# Patient Record
Sex: Female | Born: 1950 | Race: White | Hispanic: No | Smoking: Current every day smoker
Health system: Southern US, Community
[De-identification: ages and names within clinical notes are randomized; demographics above are authoritative.]

## PROBLEM LIST (undated history)

## (undated) DIAGNOSIS — K589 Irritable bowel syndrome without diarrhea: Secondary | ICD-10-CM

## (undated) DIAGNOSIS — M1990 Unspecified osteoarthritis, unspecified site: Secondary | ICD-10-CM

## (undated) DIAGNOSIS — F32A Depression, unspecified: Secondary | ICD-10-CM

## (undated) DIAGNOSIS — C7931 Secondary malignant neoplasm of brain: Secondary | ICD-10-CM

## (undated) DIAGNOSIS — C7951 Secondary malignant neoplasm of bone: Secondary | ICD-10-CM

## (undated) DIAGNOSIS — J449 Chronic obstructive pulmonary disease, unspecified: Secondary | ICD-10-CM

## (undated) DIAGNOSIS — C50919 Malignant neoplasm of unspecified site of unspecified female breast: Secondary | ICD-10-CM

## (undated) DIAGNOSIS — C801 Malignant (primary) neoplasm, unspecified: Secondary | ICD-10-CM

## (undated) DIAGNOSIS — K219 Gastro-esophageal reflux disease without esophagitis: Secondary | ICD-10-CM

## (undated) DIAGNOSIS — F329 Major depressive disorder, single episode, unspecified: Secondary | ICD-10-CM

## (undated) HISTORY — DX: Secondary malignant neoplasm of brain: C79.31

## (undated) HISTORY — PX: REPLACEMENT TOTAL KNEE: SUR1224

## (undated) HISTORY — DX: Malignant neoplasm of unspecified site of unspecified female breast: C50.919

## (undated) HISTORY — DX: Irritable bowel syndrome, unspecified: K58.9

## (undated) HISTORY — DX: Secondary malignant neoplasm of bone: C79.51

## (undated) HISTORY — PX: JOINT REPLACEMENT: SHX530

## (undated) HISTORY — DX: Malignant (primary) neoplasm, unspecified: C80.1

---

## 1976-01-03 HISTORY — PX: ABDOMINAL HYSTERECTOMY: SHX81

## 2013-01-02 DIAGNOSIS — C801 Malignant (primary) neoplasm, unspecified: Secondary | ICD-10-CM

## 2013-01-02 HISTORY — PX: PORTACATH PLACEMENT: SHX2246

## 2013-01-02 HISTORY — DX: Malignant (primary) neoplasm, unspecified: C80.1

## 2013-03-16 ENCOUNTER — Emergency Department: Payer: Self-pay | Admitting: Emergency Medicine

## 2013-03-16 LAB — BASIC METABOLIC PANEL
ANION GAP: 8 (ref 7–16)
BUN: 18 mg/dL (ref 7–18)
CHLORIDE: 103 mmol/L (ref 98–107)
CO2: 26 mmol/L (ref 21–32)
Calcium, Total: 10.1 mg/dL (ref 8.5–10.1)
Creatinine: 0.79 mg/dL (ref 0.60–1.30)
EGFR (African American): >60
GLUCOSE: 93 mg/dL (ref 65–99)
OSMOLALITY: 275 (ref 275–301)
POTASSIUM: 3.9 mmol/L (ref 3.5–5.1)
Sodium: 137 mmol/L (ref 136–145)

## 2013-03-16 LAB — CBC
HCT: 44.6 % (ref 35.0–47.0)
HGB: 14.2 g/dL (ref 12.0–16.0)
MCH: 28.7 pg (ref 26.0–34.0)
MCHC: 32 g/dL (ref 32.0–36.0)
MCV: 90 fL (ref 80–100)
Platelet: 330 10*3/uL (ref 150–440)
RBC: 4.96 10*6/uL (ref 3.80–5.20)
RDW: 15 % — AB (ref 11.5–14.5)
WBC: 10.6 10*3/uL (ref 3.6–11.0)

## 2013-03-16 LAB — TROPONIN I

## 2013-03-19 ENCOUNTER — Ambulatory Visit: Payer: Self-pay | Admitting: Internal Medicine

## 2013-03-19 LAB — HEPATIC FUNCTION PANEL A (ARMC)
ALT: 23 U/L (ref 12–78)
Albumin: 3.4 g/dL (ref 3.4–5.0)
Alkaline Phosphatase: 106 U/L
Bilirubin, Direct: 0.1 mg/dL (ref 0.00–0.20)
Bilirubin,Total: 0.3 mg/dL (ref 0.2–1.0)
SGOT(AST): 49 U/L — ABNORMAL HIGH (ref 15–37)
Total Protein: 7.5 g/dL (ref 6.4–8.2)

## 2013-03-20 LAB — CEA: CEA: 5.5 ng/mL — ABNORMAL HIGH (ref 0.0–4.7)

## 2013-03-20 LAB — CANCER ANTIGEN 27.29: CA 27.29: 916.3 U/mL — ABNORMAL HIGH (ref 0.0–38.6)

## 2013-03-21 ENCOUNTER — Other Ambulatory Visit: Payer: Self-pay | Admitting: General Surgery

## 2013-03-21 ENCOUNTER — Encounter: Payer: Self-pay | Admitting: General Surgery

## 2013-03-21 ENCOUNTER — Ambulatory Visit (INDEPENDENT_AMBULATORY_CARE_PROVIDER_SITE_OTHER): Payer: No Typology Code available for payment source | Admitting: General Surgery

## 2013-03-21 VITALS — BP 126/70 | HR 68 | Resp 14 | Ht 64.0 in | Wt 143.0 lb

## 2013-03-21 DIAGNOSIS — N63 Unspecified lump in unspecified breast: Secondary | ICD-10-CM

## 2013-03-21 NOTE — Progress Notes (Signed)
Patient ID: Elizabeth Bond, female   DOB: 1950-05-11, 63 y.o.   MRN: [id number]  Chief Complaint  Patient presents with  . Other    right breast mass/ulceration    HPI Elizabeth Bond is a 63 y.o. female who presents for an evaluation of right breast mass/ulceration. The patient states she has never had a mammogram done. She has a family history of breast cancer. The patient states she went to the ER due to back pain and while there they found the breast mass/ulceration. The patient has lost approximately 14 pounds. Appetite is poor. She states the breast has been like this for approximately 2-3 months.   HPI  Past Medical History  Diagnosis Date  . IBS (irritable bowel syndrome)     Past Surgical History  Procedure Laterality Date  . Replacement total knee Left 2004-2005?  Marland Kitchen Abdominal hysterectomy  1978    Family History  Problem Relation Age of Onset  . Cancer Father     prostate  . Cancer Maternal Aunt     breast  . Cancer Cousin     breast  . Cancer Other     lung cancer - neice    Social History History  Substance Use Topics  . Smoking status: Current Every Day Smoker -- 0.25 packs/day for 40 years    Types: Cigarettes  . Smokeless tobacco: Never Used  . Alcohol Use: Yes    No Known Allergies  Current Outpatient Prescriptions  Medication Sig Dispense Refill  . fentaNYL (DURAGESIC - DOSED MCG/HR) 12 MCG/HR Place 12.5 mcg onto the skin every 3 (three) days.      Marland Kitchen ibuprofen (ADVIL,MOTRIN) 200 MG tablet Take 400 mg by mouth every 6 (six) hours as needed.      Marland Kitchen oxyCODONE-acetaminophen (PERCOCET/ROXICET) 5-325 MG per tablet Take 2 tablets by mouth every 6 (six) hours.       No current facility-administered medications for this visit.    Review of Systems Review of Systems  Constitutional: Negative.   Respiratory: Negative.   Cardiovascular: Negative.     Blood pressure 126/70, pulse 68, resp. rate 14, height 5\' 4"  (1.626 m), weight 143 lb (64.864 kg).  Physical  Exam Physical Exam  Constitutional: She appears well-developed and well-nourished.  Neck: Neck supple. No thyromegaly present.  Pulmonary/Chest:  Right breast is contracted hard with central ulceration and fixed to the chest wall.   Lymphadenopathy:    She has no cervical adenopathy.  Hard node palpated in the right axilla.     Data Reviewed none  Assessment    CA right breast on clinical exam. Needs core biopsy to confirm.     Plan    With consent core biopsy completed today.     CORE BREAST BIOPSY REPORT  Name:  Elizabeth Bond DOB:  February 17, 1950  Vital signs:BP 126/70  Pulse 68  Resp 14  Ht 5\' 4"  (1.626 m)  Wt 143 lb (64.864 kg)  BMI 24.53 kg/m2  Lesion: right breast mass  Location:   Right  Whole breast Local anesthetic:   58ml of 1% Xylocaine/0.5% Marcaine  Prep:  Chloroprep Device:  14Gauge   Bard   Ultrasound guidance: not used Patient tolerance:  Patient tolerated the procedure well   Approach: unspecified  Clip:not used Dressing:Steristrips, telfa and tegaderm. Ice pack applied. Written instructions provided to patient regarding wound care.   Patient advised that patient will be contacted by phone when pathology report is available.  Followup appointment to be scheduled after  pathology.   CC: No PCP Per Patient, No ref. provider found  Jayln Madeira G     Sanjeev Main G 03/25/2013, 1:25 PM

## 2013-03-21 NOTE — Patient Instructions (Signed)
CARE AFTER BREAST BIOPSY  1. Leave the dressing on that your doctor applied after surgery. It is waterproof. You may bathe, shower and/or swim. The dressing will probably remain intact until your return office visit. If the dressing comes off, you will see small strips of tape against your skin on the incision. Do not remove these strips.  2. You may want to use a gauze,cloth or similar protection in your bra to prevent rubbing against your dressing and incision. This is not necessary, but you may feel more comfortable doing so.  3. It is recommended that you wear a bra day and night to give support to the breast. This will prevent the weight of the breast from pulling on the incision.  4. Your breast will feel hard and lumpy under the incision. Do not be alarmed. This is the underlying stitching of tissue. Softening of this tissue will occur in time.  5. Make sure you call the office and schedule an appointment in one week after your surgery. The office phone number is [phone]. The [facility] may have already done this for you.  6. You will notice about a week after your office visit that the strips of the tape on your incision will begin to loosen. These may then be removed.  7. Report to your doctor any of the following:  * Severe pain not relieved by your pain medication  *Redness of the incision  * Drainage from the incision  *Fever greater than 101 degrees   The patient is aware to call back for any questions or concerns.

## 2013-03-24 ENCOUNTER — Ambulatory Visit: Payer: Self-pay | Admitting: Internal Medicine

## 2013-03-25 ENCOUNTER — Encounter: Payer: Self-pay | Admitting: General Surgery

## 2013-03-27 LAB — PATHOLOGY

## 2013-04-02 ENCOUNTER — Ambulatory Visit: Payer: Self-pay | Admitting: Internal Medicine

## 2013-04-03 ENCOUNTER — Telehealth: Payer: Self-pay | Admitting: *Deleted

## 2013-04-03 NOTE — Telephone Encounter (Signed)
Daughter called asking about dressing care. Do they need to continue with the cream. She is changing the dressing twice a day. She wasn't sure if there was anything else that needed to be done and asked about her results. She has an appointment with Dr Cynda Acres tomorrow.

## 2013-04-10 LAB — CBC CANCER CENTER
BASOS PCT: 0.4 %
Basophil #: 0 x10 3/mm (ref 0.0–0.1)
EOS PCT: 0.5 %
Eosinophil #: 0.1 x10 3/mm (ref 0.0–0.7)
HCT: 45.8 % (ref 35.0–47.0)
HGB: 14.8 g/dL (ref 12.0–16.0)
LYMPHS ABS: 1.5 x10 3/mm (ref 1.0–3.6)
Lymphocyte %: 15.1 %
MCH: 28.8 pg (ref 26.0–34.0)
MCHC: 32.2 g/dL (ref 32.0–36.0)
MCV: 89 fL (ref 80–100)
MONOS PCT: 5.9 %
Monocyte #: 0.6 x10 3/mm (ref 0.2–0.9)
NEUTROS ABS: 7.7 x10 3/mm — AB (ref 1.4–6.5)
Neutrophil %: 78.1 %
PLATELETS: 409 x10 3/mm (ref 150–440)
RBC: 5.13 10*6/uL (ref 3.80–5.20)
RDW: 15 % — ABNORMAL HIGH (ref 11.5–14.5)
WBC: 9.9 x10 3/mm (ref 3.6–11.0)

## 2013-04-30 LAB — CBC CANCER CENTER
BASOS PCT: 0.7 %
Basophil #: 0.1 x10 3/mm (ref 0.0–0.1)
Eosinophil #: 0 x10 3/mm (ref 0.0–0.7)
Eosinophil %: 0.4 %
HCT: 47.7 % — ABNORMAL HIGH (ref 35.0–47.0)
HGB: 16 g/dL (ref 12.0–16.0)
LYMPHS ABS: 0.6 x10 3/mm — AB (ref 1.0–3.6)
Lymphocyte %: 8.5 %
MCH: 29.4 pg (ref 26.0–34.0)
MCHC: 33.6 g/dL (ref 32.0–36.0)
MCV: 87 fL (ref 80–100)
Monocyte #: 0.4 x10 3/mm (ref 0.2–0.9)
Monocyte %: 5.7 %
NEUTROS PCT: 84.7 %
Neutrophil #: 6.3 x10 3/mm (ref 1.4–6.5)
PLATELETS: 314 x10 3/mm (ref 150–440)
RBC: 5.46 10*6/uL — AB (ref 3.80–5.20)
RDW: 15.5 % — ABNORMAL HIGH (ref 11.5–14.5)
WBC: 7.4 x10 3/mm (ref 3.6–11.0)

## 2013-04-30 LAB — COMPREHENSIVE METABOLIC PANEL
ALT: 41 U/L (ref 12–78)
AST: 27 U/L (ref 15–37)
Albumin: 3.3 g/dL — ABNORMAL LOW (ref 3.4–5.0)
Alkaline Phosphatase: 124 U/L — ABNORMAL HIGH
Anion Gap: 9 (ref 7–16)
BILIRUBIN TOTAL: 0.5 mg/dL (ref 0.2–1.0)
BUN: 13 mg/dL (ref 7–18)
CO2: 29 mmol/L (ref 21–32)
Calcium, Total: 8.2 mg/dL — ABNORMAL LOW (ref 8.5–10.1)
Chloride: 102 mmol/L (ref 98–107)
Creatinine: 0.83 mg/dL (ref 0.60–1.30)
EGFR (Non-African Amer.): >60
GLUCOSE: 87 mg/dL (ref 65–99)
OSMOLALITY: 279 (ref 275–301)
POTASSIUM: 3.7 mmol/L (ref 3.5–5.1)
Sodium: 140 mmol/L (ref 136–145)
Total Protein: 7.4 g/dL (ref 6.4–8.2)

## 2013-04-30 LAB — PHOSPHORUS: Phosphorus: 1.8 mg/dL — ABNORMAL LOW (ref 2.5–4.9)

## 2013-05-01 LAB — CANCER ANTIGEN 19-9: CA 19 9: 19 U/mL (ref 0–35)

## 2013-05-02 ENCOUNTER — Ambulatory Visit: Payer: Self-pay | Admitting: Internal Medicine

## 2013-05-03 ENCOUNTER — Other Ambulatory Visit (HOSPITAL_COMMUNITY): Payer: Self-pay | Admitting: Internal Medicine

## 2013-05-09 ENCOUNTER — Other Ambulatory Visit (HOSPITAL_COMMUNITY): Payer: Self-pay | Admitting: Internal Medicine

## 2013-05-15 LAB — POTASSIUM: Potassium: 3.9 mmol/L (ref 3.5–5.1)

## 2013-05-15 LAB — PHOSPHORUS: Phosphorus: 2.3 mg/dL — ABNORMAL LOW (ref 2.5–4.9)

## 2013-05-16 LAB — CANCER ANTIGEN 27.29: CA 27.29: 913 U/mL — ABNORMAL HIGH (ref 0.0–38.6)

## 2013-06-02 ENCOUNTER — Ambulatory Visit: Payer: Self-pay | Admitting: Internal Medicine

## 2013-06-02 LAB — CBC CANCER CENTER
Basophil #: 0.1 x10 3/mm (ref 0.0–0.1)
Basophil %: 0.9 %
Eosinophil #: 0 x10 3/mm (ref 0.0–0.7)
Eosinophil %: 0.2 %
HCT: 36.2 % (ref 35.0–47.0)
HGB: 12.2 g/dL (ref 12.0–16.0)
Lymphocyte #: 1 x10 3/mm (ref 1.0–3.6)
Lymphocyte %: 11.3 %
MCH: 30.5 pg (ref 26.0–34.0)
MCHC: 33.6 g/dL (ref 32.0–36.0)
MCV: 91 fL (ref 80–100)
Monocyte #: 0.7 x10 3/mm (ref 0.2–0.9)
Monocyte %: 8.2 %
Neutrophil #: 7 x10 3/mm — ABNORMAL HIGH (ref 1.4–6.5)
Neutrophil %: 79.4 %
PLATELETS: 314 x10 3/mm (ref 150–440)
RBC: 4 10*6/uL (ref 3.80–5.20)
RDW: 18.2 % — AB (ref 11.5–14.5)
WBC: 8.8 x10 3/mm (ref 3.6–11.0)

## 2013-06-02 LAB — COMPREHENSIVE METABOLIC PANEL
AST: 21 U/L (ref 15–37)
Albumin: 3 g/dL — ABNORMAL LOW (ref 3.4–5.0)
Alkaline Phosphatase: 105 U/L
Anion Gap: 10 (ref 7–16)
BUN: 15 mg/dL (ref 7–18)
Bilirubin,Total: 0.3 mg/dL (ref 0.2–1.0)
CALCIUM: 8.6 mg/dL (ref 8.5–10.1)
Chloride: 105 mmol/L (ref 98–107)
Co2: 27 mmol/L (ref 21–32)
Creatinine: 0.7 mg/dL (ref 0.60–1.30)
EGFR (African American): >60
EGFR (Non-African Amer.): >60
Glucose: 94 mg/dL (ref 65–99)
Osmolality: 284 (ref 275–301)
Potassium: 3.9 mmol/L (ref 3.5–5.1)
SGPT (ALT): 34 U/L (ref 12–78)
Sodium: 142 mmol/L (ref 136–145)
Total Protein: 6.8 g/dL (ref 6.4–8.2)

## 2013-06-16 ENCOUNTER — Encounter: Payer: Self-pay | Admitting: General Surgery

## 2013-06-16 ENCOUNTER — Ambulatory Visit (INDEPENDENT_AMBULATORY_CARE_PROVIDER_SITE_OTHER): Payer: No Typology Code available for payment source | Admitting: General Surgery

## 2013-06-16 VITALS — BP 144/69 | HR 66 | Resp 14 | Ht 64.0 in | Wt 135.0 lb

## 2013-06-16 DIAGNOSIS — C50919 Malignant neoplasm of unspecified site of unspecified female breast: Secondary | ICD-10-CM | POA: Insufficient documentation

## 2013-06-16 NOTE — Patient Instructions (Addendum)
Patient to be scheduled for port placement. The patient is aware to call back for any questions or concerns.  Patient is scheduled for surgery at [facility] on 06/20/13. She will pre admit by phone. Patient is aware of date and instructions.

## 2013-06-16 NOTE — Progress Notes (Signed)
Patient ID: Elizabeth Bond, female   DOB: 1950/10/18, 63 y.o.   MRN: [id number]  Chief Complaint  Patient presents with  . Other    Evaluation of port placement     HPI [name] is a 63 y.o. female who presents for an evaluation of a port placement. The only complaint at this time is ankle/leg swelling. The patient has completed radiation therapy. Pt has metastatic right breast cancer. She declined chemo but opted for weekly hercepti and letrazole. Due to poor venous access port was requested.  HPI  Past Medical History  Diagnosis Date  . IBS (irritable bowel syndrome)   . Cancer 2015    breast and bone    Past Surgical History  Procedure Laterality Date  . Replacement total knee Left 2004-2005?  Marland Kitchen Abdominal hysterectomy  1978    Family History  Problem Relation Age of Onset  . Cancer Father     prostate  . Cancer Maternal Aunt     breast  . Cancer Cousin     breast  . Cancer Other     lung cancer - neice    Social History History  Substance Use Topics  . Smoking status: Former Smoker -- 0.25 packs/day for 40 years    Types: Cigarettes    Quit date: 04/02/2013  . Smokeless tobacco: Never Used  . Alcohol Use: Yes    No Known Allergies  Current Outpatient Prescriptions  Medication Sig Dispense Refill  . ALPRAZolam (XANAX) 0.5 MG tablet Take 1 tablet by mouth 3 (three) times daily.      . furosemide (LASIX) 20 MG tablet 1 tab by mouth 3 times a week.      . letrozole (FEMARA) 2.5 MG tablet Take 2.5 mg by mouth daily.      Marland Kitchen oxycodone (OXY-IR) 5 MG capsule Take 5 mg by mouth every 4 (four) hours as needed.      . OXYCONTIN 10 MG T12A 12 hr tablet Take 2 tablets by mouth 4 (four) times daily as needed.      . pantoprazole (PROTONIX) 40 MG tablet Take 40 mg by mouth daily.      Marland Kitchen PHOSPHA 250 NEUTRAL [id number] MG tablet Take 1 tablet by mouth 2 (two) times daily.       No current facility-administered medications for this visit.    Review of Systems Review of  Systems  Constitutional: Negative.   Respiratory: Negative.   Cardiovascular: Positive for leg swelling.    Blood pressure 144/69, pulse 66, resp. rate 14, height 5\' 4"  (1.626 m), weight 135 lb (61.236 kg).  Physical Exam Physical Exam  Constitutional: She is oriented to person, place, and time. She appears well-developed and well-nourished.  Eyes: Conjunctivae are normal. No scleral icterus.  Neck: Neck supple. No thyromegaly present.  Cardiovascular: Normal rate, regular rhythm and normal heart sounds.   No murmur heard. Pulmonary/Chest: Effort normal and breath sounds normal.  Lymphadenopathy:    She has no cervical adenopathy.  Neurological: She is alert and oriented to person, place, and time.  Skin: Skin is warm and dry.    Data Reviewed  None  Assessment    Discussed port placement. Patient agreeable. Procedure, risks and benefits explained.    Plan         Patient is scheduled for surgery at [facility] on 06/20/13. She will pre admit by phone. Patient is aware of date and instructions.    Elizabeth,Bond G 06/16/2013, 9:07 PM

## 2013-06-20 ENCOUNTER — Ambulatory Visit: Payer: Self-pay | Admitting: General Surgery

## 2013-06-20 DIAGNOSIS — C50919 Malignant neoplasm of unspecified site of unspecified female breast: Secondary | ICD-10-CM

## 2013-06-23 LAB — CBC CANCER CENTER
BASOS PCT: 2.9 %
Basophil #: 0.2 x10 3/mm — ABNORMAL HIGH (ref 0.0–0.1)
Eosinophil #: 0.1 x10 3/mm (ref 0.0–0.7)
Eosinophil %: 1.2 %
HCT: 36.8 % (ref 35.0–47.0)
HGB: 12 g/dL (ref 12.0–16.0)
LYMPHS ABS: 1 x10 3/mm (ref 1.0–3.6)
LYMPHS PCT: 17.9 %
MCH: 29.8 pg (ref 26.0–34.0)
MCHC: 32.7 g/dL (ref 32.0–36.0)
MCV: 91 fL (ref 80–100)
Monocyte #: 0.4 x10 3/mm (ref 0.2–0.9)
Monocyte %: 6.7 %
Neutrophil #: 4.1 x10 3/mm (ref 1.4–6.5)
Neutrophil %: 71.3 %
Platelet: 360 x10 3/mm (ref 150–440)
RBC: 4.03 10*6/uL (ref 3.80–5.20)
RDW: 18.6 % — AB (ref 11.5–14.5)
WBC: 5.7 x10 3/mm (ref 3.6–11.0)

## 2013-06-23 LAB — PHOSPHORUS: PHOSPHORUS: 3.4 mg/dL (ref 2.5–4.9)

## 2013-06-23 LAB — CALCIUM: Calcium, Total: 8.1 mg/dL — ABNORMAL LOW (ref 8.5–10.1)

## 2013-06-24 ENCOUNTER — Encounter: Payer: Self-pay | Admitting: General Surgery

## 2013-06-24 LAB — CANCER ANTIGEN 27.29: CA 27.29: 286.2 U/mL — ABNORMAL HIGH (ref 0.0–38.6)

## 2013-06-30 ENCOUNTER — Ambulatory Visit (INDEPENDENT_AMBULATORY_CARE_PROVIDER_SITE_OTHER): Payer: Self-pay | Admitting: General Surgery

## 2013-06-30 ENCOUNTER — Encounter: Payer: Self-pay | Admitting: General Surgery

## 2013-06-30 VITALS — BP 130/80 | HR 74 | Resp 12 | Ht 60.0 in | Wt 139.0 lb

## 2013-06-30 DIAGNOSIS — C50919 Malignant neoplasm of unspecified site of unspecified female breast: Secondary | ICD-10-CM

## 2013-06-30 NOTE — Patient Instructions (Signed)
Patient to return as needed. The patient is aware to call back for any questions or concerns. 

## 2013-06-30 NOTE — Progress Notes (Signed)
Patient ID: Elizabeth Bond, female   DOB: Nov 20, 1950, 63 y.o.   MRN: [id number]  The patient presents for a post op port placement. The procedure was performed on 06/20/2013. The patient is doing well. No new complaints at this time.   Port site looks clean. Lungs are clear. Patient with no pulmonary complaints. Chest x-ray post procedure showed good positioning of the catheter.

## 2013-07-02 ENCOUNTER — Other Ambulatory Visit (HOSPITAL_COMMUNITY): Payer: Self-pay | Admitting: Internal Medicine

## 2013-07-02 ENCOUNTER — Ambulatory Visit: Payer: Self-pay | Admitting: Internal Medicine

## 2013-07-03 LAB — ER/PR,IMMUNOHISTOCHEM,PARAFFIN
Estrogen Receptor IHC: >90 %
Progesterone Recp IP: 30 %

## 2013-07-03 LAB — HER-2 / NEU, FISH
Avg Num CEP17 probes/nucleus:: 2.7
Avg Num Her-2 signals/nucleus:: 12.5
HER-2/CEP17 Ratio: 4.62
NUMBER OF OBSERVERS: 2
NUMBER OF TUMOR CELLS COUNTED: 40

## 2013-07-14 LAB — CBC CANCER CENTER
BASOS ABS: 0.1 x10 3/mm (ref 0.0–0.1)
Basophil %: 1 %
EOS ABS: 0.1 x10 3/mm (ref 0.0–0.7)
Eosinophil %: 1.6 %
HCT: 38 % (ref 35.0–47.0)
HGB: 12.4 g/dL (ref 12.0–16.0)
LYMPHS PCT: 13.4 %
Lymphocyte #: 0.8 x10 3/mm — ABNORMAL LOW (ref 1.0–3.6)
MCH: 29.9 pg (ref 26.0–34.0)
MCHC: 32.7 g/dL (ref 32.0–36.0)
MCV: 91 fL (ref 80–100)
Monocyte #: 0.5 x10 3/mm (ref 0.2–0.9)
Monocyte %: 8 %
NEUTROS PCT: 76 %
Neutrophil #: 4.6 x10 3/mm (ref 1.4–6.5)
Platelet: 316 x10 3/mm (ref 150–440)
RBC: 4.16 10*6/uL (ref 3.80–5.20)
RDW: 18.1 % — ABNORMAL HIGH (ref 11.5–14.5)
WBC: 6.1 x10 3/mm (ref 3.6–11.0)

## 2013-07-14 LAB — BASIC METABOLIC PANEL
Anion Gap: 7 (ref 7–16)
BUN: 8 mg/dL (ref 7–18)
CALCIUM: 7.9 mg/dL — AB (ref 8.5–10.1)
CREATININE: 0.72 mg/dL (ref 0.60–1.30)
Chloride: 103 mmol/L (ref 98–107)
Co2: 31 mmol/L (ref 21–32)
GLUCOSE: 89 mg/dL (ref 65–99)
OSMOLALITY: 279 (ref 275–301)
POTASSIUM: 3.3 mmol/L — AB (ref 3.5–5.1)
Sodium: 141 mmol/L (ref 136–145)

## 2013-07-23 ENCOUNTER — Observation Stay: Payer: Self-pay | Admitting: Internal Medicine

## 2013-07-23 LAB — COMPREHENSIVE METABOLIC PANEL
ALBUMIN: 3.8 g/dL (ref 3.4–5.0)
ALK PHOS: 92 U/L
AST: 18 U/L (ref 15–37)
Anion Gap: 8 (ref 7–16)
BUN: 6 mg/dL — ABNORMAL LOW (ref 7–18)
Bilirubin,Total: 0.5 mg/dL (ref 0.2–1.0)
CALCIUM: 8.9 mg/dL (ref 8.5–10.1)
CHLORIDE: 100 mmol/L (ref 98–107)
Co2: 31 mmol/L (ref 21–32)
Creatinine: 0.64 mg/dL (ref 0.60–1.30)
EGFR (African American): >60
EGFR (Non-African Amer.): >60
Glucose: 92 mg/dL (ref 65–99)
Osmolality: 275 (ref 275–301)
Potassium: 3.9 mmol/L (ref 3.5–5.1)
SGPT (ALT): 13 U/L — ABNORMAL LOW
SODIUM: 139 mmol/L (ref 136–145)
Total Protein: 7.9 g/dL (ref 6.4–8.2)

## 2013-07-23 LAB — CBC CANCER CENTER
Basophil #: 0.2 x10 3/mm — ABNORMAL HIGH (ref 0.0–0.1)
Basophil %: 3.2 %
EOS ABS: 0 x10 3/mm (ref 0.0–0.7)
EOS PCT: 0.5 %
HCT: 43.8 % (ref 35.0–47.0)
HGB: 14.2 g/dL (ref 12.0–16.0)
Lymphocyte #: 0.5 x10 3/mm — ABNORMAL LOW (ref 1.0–3.6)
Lymphocyte %: 6 %
MCH: 29.6 pg (ref 26.0–34.0)
MCHC: 32.4 g/dL (ref 32.0–36.0)
MCV: 91 fL (ref 80–100)
Monocyte #: 0.4 x10 3/mm (ref 0.2–0.9)
Monocyte %: 4.5 %
Neutrophil #: 6.7 x10 3/mm — ABNORMAL HIGH (ref 1.4–6.5)
Neutrophil %: 85.8 %
Platelet: 363 x10 3/mm (ref 150–440)
RBC: 4.8 10*6/uL (ref 3.80–5.20)
RDW: 17 % — AB (ref 11.5–14.5)
WBC: 7.8 x10 3/mm (ref 3.6–11.0)

## 2013-07-23 LAB — MAGNESIUM: MAGNESIUM: 1.9 mg/dL

## 2013-07-24 LAB — POTASSIUM: Potassium: 3 mmol/L — ABNORMAL LOW (ref 3.5–5.1)

## 2013-07-25 LAB — CANCER ANTIGEN 27.29: CA 27.29: 111.6 U/mL — AB (ref 0.0–38.6)

## 2013-07-28 LAB — POTASSIUM: POTASSIUM: 3.9 mmol/L (ref 3.5–5.1)

## 2013-07-28 LAB — CULTURE, BLOOD (SINGLE)

## 2013-08-02 ENCOUNTER — Ambulatory Visit: Payer: Self-pay | Admitting: Internal Medicine

## 2013-08-04 LAB — POTASSIUM: POTASSIUM: 3.8 mmol/L (ref 3.5–5.1)

## 2013-08-04 LAB — CALCIUM: Calcium, Total: 7.5 mg/dL — ABNORMAL LOW (ref 8.5–10.1)

## 2013-08-25 LAB — CBC CANCER CENTER
BASOS ABS: 0 x10 3/mm (ref 0.0–0.1)
Basophil %: 0.6 %
Eosinophil #: 0.1 x10 3/mm (ref 0.0–0.7)
Eosinophil %: 0.9 %
HCT: 38.9 % (ref 35.0–47.0)
HGB: 12.7 g/dL (ref 12.0–16.0)
LYMPHS PCT: 15 %
Lymphocyte #: 0.9 x10 3/mm — ABNORMAL LOW (ref 1.0–3.6)
MCH: 30.1 pg (ref 26.0–34.0)
MCHC: 32.6 g/dL (ref 32.0–36.0)
MCV: 92 fL (ref 80–100)
MONOS PCT: 7.3 %
Monocyte #: 0.4 x10 3/mm (ref 0.2–0.9)
NEUTROS ABS: 4.7 x10 3/mm (ref 1.4–6.5)
NEUTROS PCT: 76.2 %
Platelet: 255 x10 3/mm (ref 150–440)
RBC: 4.22 10*6/uL (ref 3.80–5.20)
RDW: 14.7 % — AB (ref 11.5–14.5)
WBC: 6.1 x10 3/mm (ref 3.6–11.0)

## 2013-08-25 LAB — HEPATIC FUNCTION PANEL A (ARMC)
Albumin: 3.7 g/dL (ref 3.4–5.0)
Alkaline Phosphatase: 65 U/L
Bilirubin,Total: 0.3 mg/dL (ref 0.2–1.0)
SGOT(AST): 15 U/L (ref 15–37)
SGPT (ALT): 13 U/L — ABNORMAL LOW
Total Protein: 7.1 g/dL (ref 6.4–8.2)

## 2013-08-25 LAB — CREATININE, SERUM
Creatinine: 1 mg/dL (ref 0.60–1.30)
EGFR (Non-African Amer.): 59 — ABNORMAL LOW

## 2013-08-25 LAB — CALCIUM: CALCIUM: 8.6 mg/dL (ref 8.5–10.1)

## 2013-08-25 LAB — PHOSPHORUS: Phosphorus: 3 mg/dL (ref 2.5–4.9)

## 2013-08-26 LAB — CANCER ANTIGEN 27.29: CA 27.29: 70.8 U/mL — ABNORMAL HIGH (ref 0.0–38.6)

## 2013-09-02 ENCOUNTER — Ambulatory Visit: Payer: Self-pay | Admitting: Internal Medicine

## 2013-09-15 LAB — CBC CANCER CENTER
Basophil #: 0.1 x10 3/mm (ref 0.0–0.1)
Basophil %: 0.8 %
EOS PCT: 0.7 %
Eosinophil #: 0 x10 3/mm (ref 0.0–0.7)
HCT: 41.1 % (ref 35.0–47.0)
HGB: 13.5 g/dL (ref 12.0–16.0)
LYMPHS PCT: 11.5 %
Lymphocyte #: 0.8 x10 3/mm — ABNORMAL LOW (ref 1.0–3.6)
MCH: 29.6 pg (ref 26.0–34.0)
MCHC: 32.8 g/dL (ref 32.0–36.0)
MCV: 90 fL (ref 80–100)
MONO ABS: 0.4 x10 3/mm (ref 0.2–0.9)
MONOS PCT: 6.6 %
NEUTROS ABS: 5.3 x10 3/mm (ref 1.4–6.5)
Neutrophil %: 80.4 %
PLATELETS: 212 x10 3/mm (ref 150–440)
RBC: 4.56 10*6/uL (ref 3.80–5.20)
RDW: 14.2 % (ref 11.5–14.5)
WBC: 6.6 x10 3/mm (ref 3.6–11.0)

## 2013-09-16 LAB — CANCER ANTIGEN 27.29: CA 27.29: 56.4 U/mL — ABNORMAL HIGH (ref 0.0–38.6)

## 2013-10-02 ENCOUNTER — Ambulatory Visit: Payer: Self-pay | Admitting: Internal Medicine

## 2013-10-08 LAB — CBC CANCER CENTER
Basophil #: 0 x10 3/mm (ref 0.0–0.1)
Basophil %: 0.6 %
Eosinophil #: 0.1 x10 3/mm (ref 0.0–0.7)
Eosinophil %: 0.9 %
HCT: 40.5 % (ref 35.0–47.0)
HGB: 13.3 g/dL (ref 12.0–16.0)
LYMPHS ABS: 0.8 x10 3/mm — AB (ref 1.0–3.6)
LYMPHS PCT: 12.6 %
MCH: 29.1 pg (ref 26.0–34.0)
MCHC: 32.9 g/dL (ref 32.0–36.0)
MCV: 88 fL (ref 80–100)
MONOS PCT: 7 %
Monocyte #: 0.4 x10 3/mm (ref 0.2–0.9)
NEUTROS ABS: 5 x10 3/mm (ref 1.4–6.5)
NEUTROS PCT: 78.9 %
PLATELETS: 231 x10 3/mm (ref 150–440)
RBC: 4.58 10*6/uL (ref 3.80–5.20)
RDW: 14.3 % (ref 11.5–14.5)
WBC: 6.3 x10 3/mm (ref 3.6–11.0)

## 2013-10-08 LAB — CREATININE, SERUM
CREATININE: 1.01 mg/dL (ref 0.60–1.30)
EGFR (Non-African Amer.): 59 — ABNORMAL LOW

## 2013-10-08 LAB — MAGNESIUM: MAGNESIUM: 2 mg/dL

## 2013-10-08 LAB — CALCIUM: CALCIUM: 9.2 mg/dL (ref 8.5–10.1)

## 2013-10-08 LAB — PHOSPHORUS: PHOSPHORUS: 3.9 mg/dL (ref 2.5–4.9)

## 2013-10-09 LAB — CANCER ANTIGEN 27.29: CA 27.29: 56.3 U/mL — AB (ref 0.0–38.6)

## 2013-11-02 ENCOUNTER — Ambulatory Visit: Payer: Self-pay | Admitting: Internal Medicine

## 2013-11-03 ENCOUNTER — Encounter: Payer: Self-pay | Admitting: General Surgery

## 2013-11-05 LAB — CBC CANCER CENTER
Basophil #: 0 x10 3/mm (ref 0.0–0.1)
Basophil %: 0.6 %
Eosinophil #: 0.1 x10 3/mm (ref 0.0–0.7)
Eosinophil %: 1 %
HCT: 38.2 % (ref 35.0–47.0)
HGB: 12.5 g/dL (ref 12.0–16.0)
LYMPHS ABS: 0.9 x10 3/mm — AB (ref 1.0–3.6)
Lymphocyte %: 12.5 %
MCH: 29.1 pg (ref 26.0–34.0)
MCHC: 32.8 g/dL (ref 32.0–36.0)
MCV: 89 fL (ref 80–100)
MONOS PCT: 6 %
Monocyte #: 0.4 x10 3/mm (ref 0.2–0.9)
Neutrophil #: 5.9 x10 3/mm (ref 1.4–6.5)
Neutrophil %: 79.9 %
PLATELETS: 242 x10 3/mm (ref 150–440)
RBC: 4.3 10*6/uL (ref 3.80–5.20)
RDW: 14.7 % — ABNORMAL HIGH (ref 11.5–14.5)
WBC: 7.4 x10 3/mm (ref 3.6–11.0)

## 2013-11-06 LAB — CANCER ANTIGEN 27.29: CA 27.29: 68.2 U/mL — ABNORMAL HIGH (ref 0.0–38.6)

## 2013-11-24 LAB — COMPREHENSIVE METABOLIC PANEL
AST: 18 U/L (ref 15–37)
Albumin: 3.7 g/dL (ref 3.4–5.0)
Alkaline Phosphatase: 73 U/L
Anion Gap: 8 (ref 7–16)
BUN: 19 mg/dL — ABNORMAL HIGH (ref 7–18)
Bilirubin,Total: 0.4 mg/dL (ref 0.2–1.0)
CHLORIDE: 100 mmol/L (ref 98–107)
CO2: 30 mmol/L (ref 21–32)
CREATININE: 0.85 mg/dL (ref 0.60–1.30)
Calcium, Total: 9.2 mg/dL (ref 8.5–10.1)
EGFR (African American): >60
EGFR (Non-African Amer.): >60
Glucose: 84 mg/dL (ref 65–99)
Osmolality: 277 (ref 275–301)
Potassium: 4.2 mmol/L (ref 3.5–5.1)
SGPT (ALT): 16 U/L
Sodium: 138 mmol/L (ref 136–145)
TOTAL PROTEIN: 7.5 g/dL (ref 6.4–8.2)

## 2013-11-24 LAB — CBC CANCER CENTER
BASOS ABS: 0.1 x10 3/mm (ref 0.0–0.1)
BASOS PCT: 0.7 %
EOS ABS: 0.1 x10 3/mm (ref 0.0–0.7)
Eosinophil %: 1.5 %
HCT: 38.5 % (ref 35.0–47.0)
HGB: 12.6 g/dL (ref 12.0–16.0)
LYMPHS PCT: 10.2 %
Lymphocyte #: 0.8 x10 3/mm — ABNORMAL LOW (ref 1.0–3.6)
MCH: 29.2 pg (ref 26.0–34.0)
MCHC: 32.8 g/dL (ref 32.0–36.0)
MCV: 89 fL (ref 80–100)
MONO ABS: 0.5 x10 3/mm (ref 0.2–0.9)
Monocyte %: 6.9 %
NEUTROS ABS: 6.3 x10 3/mm (ref 1.4–6.5)
Neutrophil %: 80.7 %
Platelet: 245 x10 3/mm (ref 150–440)
RBC: 4.33 10*6/uL (ref 3.80–5.20)
RDW: 15.2 % — ABNORMAL HIGH (ref 11.5–14.5)
WBC: 7.8 x10 3/mm (ref 3.6–11.0)

## 2013-11-24 LAB — PHOSPHORUS: Phosphorus: 3.5 mg/dL (ref 2.5–4.9)

## 2013-11-26 LAB — CANCER ANTIGEN 27.29: CA 27.29: 88.9 U/mL — ABNORMAL HIGH (ref 0.0–38.6)

## 2013-12-02 ENCOUNTER — Ambulatory Visit: Payer: Self-pay | Admitting: Internal Medicine

## 2013-12-15 LAB — CBC CANCER CENTER
BASOS ABS: 0 x10 3/mm (ref 0.0–0.1)
BASOS PCT: 0.4 %
Eosinophil #: 0.1 x10 3/mm (ref 0.0–0.7)
Eosinophil %: 1.8 %
HCT: 36.3 % (ref 35.0–47.0)
HGB: 12 g/dL (ref 12.0–16.0)
LYMPHS PCT: 10.1 %
Lymphocyte #: 0.8 x10 3/mm — ABNORMAL LOW (ref 1.0–3.6)
MCH: 29.5 pg (ref 26.0–34.0)
MCHC: 33 g/dL (ref 32.0–36.0)
MCV: 89 fL (ref 80–100)
Monocyte #: 0.5 x10 3/mm (ref 0.2–0.9)
Monocyte %: 6.6 %
NEUTROS ABS: 6.4 x10 3/mm (ref 1.4–6.5)
Neutrophil %: 81.1 %
Platelet: 299 x10 3/mm (ref 150–440)
RBC: 4.06 10*6/uL (ref 3.80–5.20)
RDW: 15.6 % — ABNORMAL HIGH (ref 11.5–14.5)
WBC: 7.8 x10 3/mm (ref 3.6–11.0)

## 2013-12-15 LAB — PHOSPHORUS: Phosphorus: 3.7 mg/dL (ref 2.5–4.9)

## 2013-12-15 LAB — CALCIUM: Calcium, Total: 9.2 mg/dL (ref 8.5–10.1)

## 2013-12-16 LAB — CANCER ANTIGEN 27.29: CA 27.29: 102.7 U/mL — ABNORMAL HIGH (ref 0.0–38.6)

## 2014-01-02 ENCOUNTER — Ambulatory Visit: Payer: Self-pay | Admitting: Internal Medicine

## 2014-01-02 DIAGNOSIS — Z79899 Other long term (current) drug therapy: Secondary | ICD-10-CM | POA: Diagnosis not present

## 2014-01-02 DIAGNOSIS — R978 Other abnormal tumor markers: Secondary | ICD-10-CM | POA: Diagnosis not present

## 2014-01-02 DIAGNOSIS — I890 Lymphedema, not elsewhere classified: Secondary | ICD-10-CM | POA: Diagnosis not present

## 2014-01-02 DIAGNOSIS — F419 Anxiety disorder, unspecified: Secondary | ICD-10-CM | POA: Diagnosis not present

## 2014-01-02 DIAGNOSIS — C50919 Malignant neoplasm of unspecified site of unspecified female breast: Secondary | ICD-10-CM | POA: Diagnosis not present

## 2014-01-02 DIAGNOSIS — C7951 Secondary malignant neoplasm of bone: Secondary | ICD-10-CM | POA: Diagnosis not present

## 2014-01-02 DIAGNOSIS — Z79811 Long term (current) use of aromatase inhibitors: Secondary | ICD-10-CM | POA: Diagnosis not present

## 2014-01-02 DIAGNOSIS — Z170 Estrogen receptor positive status [ER+]: Secondary | ICD-10-CM | POA: Diagnosis not present

## 2014-01-07 LAB — HEPATIC FUNCTION PANEL A (ARMC)
AST: 19 U/L (ref 15–37)
Albumin: 3.8 g/dL (ref 3.4–5.0)
Alkaline Phosphatase: 69 U/L
Bilirubin, Direct: 0.1 mg/dL (ref 0.0–0.2)
Bilirubin,Total: 0.3 mg/dL (ref 0.2–1.0)
SGPT (ALT): 20 U/L
Total Protein: 7.6 g/dL (ref 6.4–8.2)

## 2014-01-07 LAB — CBC CANCER CENTER
BASOS PCT: 0.6 %
Basophil #: 0 x10 3/mm (ref 0.0–0.1)
EOS ABS: 0.1 x10 3/mm (ref 0.0–0.7)
EOS PCT: 2 %
HCT: 39.5 % (ref 35.0–47.0)
HGB: 13 g/dL (ref 12.0–16.0)
LYMPHS ABS: 1.1 x10 3/mm (ref 1.0–3.6)
Lymphocyte %: 17.7 %
MCH: 29.9 pg (ref 26.0–34.0)
MCHC: 33 g/dL (ref 32.0–36.0)
MCV: 91 fL (ref 80–100)
Monocyte #: 0.4 x10 3/mm (ref 0.2–0.9)
Monocyte %: 6.7 %
Neutrophil #: 4.4 x10 3/mm (ref 1.4–6.5)
Neutrophil %: 73 %
Platelet: 212 x10 3/mm (ref 150–440)
RBC: 4.36 10*6/uL (ref 3.80–5.20)
RDW: 15.5 % — ABNORMAL HIGH (ref 11.5–14.5)
WBC: 6 x10 3/mm (ref 3.6–11.0)

## 2014-01-07 LAB — CALCIUM: CALCIUM: 9.3 mg/dL (ref 8.5–10.1)

## 2014-01-07 LAB — CREATININE, SERUM
CREATININE: 1 mg/dL (ref 0.60–1.30)
EGFR (African American): >60
EGFR (Non-African Amer.): 59 — ABNORMAL LOW

## 2014-01-09 LAB — CANCER ANTIGEN 27.29: CA 27.29: 149.4 U/mL — ABNORMAL HIGH (ref 0.0–38.6)

## 2014-01-14 ENCOUNTER — Other Ambulatory Visit (HOSPITAL_COMMUNITY): Payer: Self-pay | Admitting: Internal Medicine

## 2014-01-14 LAB — CBC CANCER CENTER
BASOS PCT: 1.5 %
Basophil #: 0.1 x10 3/mm (ref 0.0–0.1)
Eosinophil #: 0.2 x10 3/mm (ref 0.0–0.7)
Eosinophil %: 2.4 %
HCT: 39.8 % (ref 35.0–47.0)
HGB: 13.3 g/dL (ref 12.0–16.0)
LYMPHS ABS: 1.1 x10 3/mm (ref 1.0–3.6)
LYMPHS PCT: 15.8 %
MCH: 30 pg (ref 26.0–34.0)
MCHC: 33.4 g/dL (ref 32.0–36.0)
MCV: 90 fL (ref 80–100)
MONOS PCT: 6.2 %
Monocyte #: 0.4 x10 3/mm (ref 0.2–0.9)
NEUTROS ABS: 4.9 x10 3/mm (ref 1.4–6.5)
Neutrophil %: 74.1 %
Platelet: 228 x10 3/mm (ref 150–440)
RBC: 4.43 10*6/uL (ref 3.80–5.20)
RDW: 15 % — ABNORMAL HIGH (ref 11.5–14.5)
WBC: 6.7 x10 3/mm (ref 3.6–11.0)

## 2014-01-21 LAB — HEPATIC FUNCTION PANEL A (ARMC)
ALK PHOS: 60 U/L
ALT: 18 U/L
AST: 16 U/L (ref 15–37)
Albumin: 3.5 g/dL (ref 3.4–5.0)
Bilirubin, Direct: 0.1 mg/dL (ref 0.0–0.2)
Bilirubin,Total: 0.3 mg/dL (ref 0.2–1.0)
TOTAL PROTEIN: 6.8 g/dL (ref 6.4–8.2)

## 2014-01-21 LAB — CBC CANCER CENTER
Basophil #: 0 x10 3/mm (ref 0.0–0.1)
Basophil %: 0.5 %
Eosinophil #: 0.1 x10 3/mm (ref 0.0–0.7)
Eosinophil %: 1.9 %
HCT: 37.3 % (ref 35.0–47.0)
HGB: 12.3 g/dL (ref 12.0–16.0)
LYMPHS PCT: 16.1 %
Lymphocyte #: 1.1 x10 3/mm (ref 1.0–3.6)
MCH: 29.8 pg (ref 26.0–34.0)
MCHC: 32.9 g/dL (ref 32.0–36.0)
MCV: 91 fL (ref 80–100)
MONOS PCT: 5.3 %
Monocyte #: 0.4 x10 3/mm (ref 0.2–0.9)
Neutrophil #: 5.1 x10 3/mm (ref 1.4–6.5)
Neutrophil %: 76.2 %
PLATELETS: 210 x10 3/mm (ref 150–440)
RBC: 4.12 10*6/uL (ref 3.80–5.20)
RDW: 14.8 % — ABNORMAL HIGH (ref 11.5–14.5)
WBC: 6.7 x10 3/mm (ref 3.6–11.0)

## 2014-01-21 LAB — CREATININE, SERUM
Creatinine: 0.95 mg/dL (ref 0.60–1.30)
EGFR (African American): >60

## 2014-01-28 LAB — CBC CANCER CENTER
Basophil #: 0.1 x10 3/mm (ref 0.0–0.1)
Basophil %: 0.7 %
EOS ABS: 0.1 x10 3/mm (ref 0.0–0.7)
Eosinophil %: 1.2 %
HCT: 39.5 % (ref 35.0–47.0)
HGB: 12.9 g/dL (ref 12.0–16.0)
LYMPHS PCT: 12.4 %
Lymphocyte #: 1 x10 3/mm (ref 1.0–3.6)
MCH: 29.8 pg (ref 26.0–34.0)
MCHC: 32.7 g/dL (ref 32.0–36.0)
MCV: 91 fL (ref 80–100)
MONOS PCT: 6.3 %
Monocyte #: 0.5 x10 3/mm (ref 0.2–0.9)
NEUTROS ABS: 6.5 x10 3/mm (ref 1.4–6.5)
Neutrophil %: 79.4 %
Platelet: 225 x10 3/mm (ref 150–440)
RBC: 4.33 10*6/uL (ref 3.80–5.20)
RDW: 14.9 % — ABNORMAL HIGH (ref 11.5–14.5)
WBC: 8.2 x10 3/mm (ref 3.6–11.0)

## 2014-02-02 ENCOUNTER — Ambulatory Visit: Payer: Self-pay | Admitting: Internal Medicine

## 2014-02-02 DIAGNOSIS — C7951 Secondary malignant neoplasm of bone: Secondary | ICD-10-CM | POA: Diagnosis not present

## 2014-02-02 DIAGNOSIS — Z79811 Long term (current) use of aromatase inhibitors: Secondary | ICD-10-CM | POA: Diagnosis not present

## 2014-02-02 DIAGNOSIS — Z79899 Other long term (current) drug therapy: Secondary | ICD-10-CM | POA: Diagnosis not present

## 2014-02-02 DIAGNOSIS — R978 Other abnormal tumor markers: Secondary | ICD-10-CM | POA: Diagnosis not present

## 2014-02-02 DIAGNOSIS — F419 Anxiety disorder, unspecified: Secondary | ICD-10-CM | POA: Diagnosis not present

## 2014-02-02 DIAGNOSIS — Z5111 Encounter for antineoplastic chemotherapy: Secondary | ICD-10-CM | POA: Diagnosis not present

## 2014-02-02 DIAGNOSIS — Z170 Estrogen receptor positive status [ER+]: Secondary | ICD-10-CM | POA: Diagnosis not present

## 2014-02-02 DIAGNOSIS — C50919 Malignant neoplasm of unspecified site of unspecified female breast: Secondary | ICD-10-CM | POA: Diagnosis not present

## 2014-02-02 DIAGNOSIS — I890 Lymphedema, not elsewhere classified: Secondary | ICD-10-CM | POA: Diagnosis not present

## 2014-02-04 DIAGNOSIS — Z170 Estrogen receptor positive status [ER+]: Secondary | ICD-10-CM | POA: Diagnosis not present

## 2014-02-04 DIAGNOSIS — C7951 Secondary malignant neoplasm of bone: Secondary | ICD-10-CM | POA: Diagnosis not present

## 2014-02-04 DIAGNOSIS — Z79899 Other long term (current) drug therapy: Secondary | ICD-10-CM | POA: Diagnosis not present

## 2014-02-04 DIAGNOSIS — C50919 Malignant neoplasm of unspecified site of unspecified female breast: Secondary | ICD-10-CM | POA: Diagnosis not present

## 2014-02-04 LAB — CBC CANCER CENTER
BASOS PCT: 0.5 %
Basophil #: 0 x10 3/mm (ref 0.0–0.1)
Eosinophil #: 0.1 x10 3/mm (ref 0.0–0.7)
Eosinophil %: 1.3 %
HCT: 36.1 % (ref 35.0–47.0)
HGB: 12.2 g/dL (ref 12.0–16.0)
LYMPHS ABS: 1.1 x10 3/mm (ref 1.0–3.6)
Lymphocyte %: 19.1 %
MCH: 30.5 pg (ref 26.0–34.0)
MCHC: 33.7 g/dL (ref 32.0–36.0)
MCV: 90 fL (ref 80–100)
MONOS PCT: 7.2 %
Monocyte #: 0.4 x10 3/mm (ref 0.2–0.9)
NEUTROS ABS: 4.3 x10 3/mm (ref 1.4–6.5)
Neutrophil %: 71.9 %
Platelet: 208 x10 3/mm (ref 150–440)
RBC: 3.99 10*6/uL (ref 3.80–5.20)
RDW: 14.5 % (ref 11.5–14.5)
WBC: 5.9 x10 3/mm (ref 3.6–11.0)

## 2014-02-04 LAB — HEPATIC FUNCTION PANEL A (ARMC)
Albumin: 3.5 g/dL (ref 3.4–5.0)
Alkaline Phosphatase: 59 U/L (ref 46–116)
Bilirubin, Direct: <0.1 mg/dL (ref 0.0–0.2)
Bilirubin,Total: 0.3 mg/dL (ref 0.2–1.0)
SGOT(AST): 22 U/L (ref 15–37)
SGPT (ALT): 20 U/L (ref 14–63)
TOTAL PROTEIN: 6.9 g/dL (ref 6.4–8.2)

## 2014-02-04 LAB — CREATININE, SERUM
CREATININE: 0.94 mg/dL (ref 0.60–1.30)
EGFR (Non-African Amer.): >60

## 2014-02-05 LAB — CANCER ANTIGEN 27.29: CA 27.29: 178 U/mL — AB (ref 0.0–38.6)

## 2014-02-18 DIAGNOSIS — Z170 Estrogen receptor positive status [ER+]: Secondary | ICD-10-CM | POA: Diagnosis not present

## 2014-02-18 DIAGNOSIS — C7951 Secondary malignant neoplasm of bone: Secondary | ICD-10-CM | POA: Diagnosis not present

## 2014-02-18 DIAGNOSIS — Z79899 Other long term (current) drug therapy: Secondary | ICD-10-CM | POA: Diagnosis not present

## 2014-02-18 DIAGNOSIS — C50919 Malignant neoplasm of unspecified site of unspecified female breast: Secondary | ICD-10-CM | POA: Diagnosis not present

## 2014-02-23 ENCOUNTER — Ambulatory Visit: Payer: Self-pay | Admitting: Internal Medicine

## 2014-02-23 DIAGNOSIS — C50919 Malignant neoplasm of unspecified site of unspecified female breast: Secondary | ICD-10-CM | POA: Diagnosis not present

## 2014-02-23 DIAGNOSIS — C50911 Malignant neoplasm of unspecified site of right female breast: Secondary | ICD-10-CM | POA: Diagnosis not present

## 2014-02-23 DIAGNOSIS — C7951 Secondary malignant neoplasm of bone: Secondary | ICD-10-CM | POA: Diagnosis not present

## 2014-03-03 ENCOUNTER — Ambulatory Visit
Admit: 2014-03-03 | Disposition: A | Discharge status: Home / Self Care | Payer: Self-pay | Attending: Internal Medicine | Admitting: Internal Medicine

## 2014-03-03 DIAGNOSIS — Z79811 Long term (current) use of aromatase inhibitors: Secondary | ICD-10-CM | POA: Diagnosis not present

## 2014-03-03 DIAGNOSIS — C50919 Malignant neoplasm of unspecified site of unspecified female breast: Secondary | ICD-10-CM | POA: Diagnosis not present

## 2014-03-03 DIAGNOSIS — Z510 Encounter for antineoplastic radiation therapy: Secondary | ICD-10-CM | POA: Diagnosis not present

## 2014-03-03 DIAGNOSIS — Z79899 Other long term (current) drug therapy: Secondary | ICD-10-CM | POA: Diagnosis not present

## 2014-03-03 DIAGNOSIS — C7951 Secondary malignant neoplasm of bone: Secondary | ICD-10-CM | POA: Diagnosis not present

## 2014-03-03 DIAGNOSIS — R978 Other abnormal tumor markers: Secondary | ICD-10-CM | POA: Diagnosis not present

## 2014-03-03 DIAGNOSIS — I890 Lymphedema, not elsewhere classified: Secondary | ICD-10-CM | POA: Diagnosis not present

## 2014-03-03 DIAGNOSIS — F419 Anxiety disorder, unspecified: Secondary | ICD-10-CM | POA: Diagnosis not present

## 2014-03-03 DIAGNOSIS — Z170 Estrogen receptor positive status [ER+]: Secondary | ICD-10-CM | POA: Diagnosis not present

## 2014-03-04 DIAGNOSIS — C50919 Malignant neoplasm of unspecified site of unspecified female breast: Secondary | ICD-10-CM | POA: Diagnosis not present

## 2014-03-04 DIAGNOSIS — C7951 Secondary malignant neoplasm of bone: Secondary | ICD-10-CM | POA: Diagnosis not present

## 2014-03-04 DIAGNOSIS — R978 Other abnormal tumor markers: Secondary | ICD-10-CM | POA: Diagnosis not present

## 2014-03-04 DIAGNOSIS — Z79899 Other long term (current) drug therapy: Secondary | ICD-10-CM | POA: Diagnosis not present

## 2014-03-05 DIAGNOSIS — C7952 Secondary malignant neoplasm of bone marrow: Secondary | ICD-10-CM | POA: Diagnosis not present

## 2014-03-10 DIAGNOSIS — C50919 Malignant neoplasm of unspecified site of unspecified female breast: Secondary | ICD-10-CM | POA: Diagnosis not present

## 2014-03-10 DIAGNOSIS — C7951 Secondary malignant neoplasm of bone: Secondary | ICD-10-CM | POA: Diagnosis not present

## 2014-03-11 DIAGNOSIS — C7951 Secondary malignant neoplasm of bone: Secondary | ICD-10-CM | POA: Diagnosis not present

## 2014-03-17 DIAGNOSIS — C50919 Malignant neoplasm of unspecified site of unspecified female breast: Secondary | ICD-10-CM | POA: Diagnosis not present

## 2014-03-17 DIAGNOSIS — C7951 Secondary malignant neoplasm of bone: Secondary | ICD-10-CM | POA: Diagnosis not present

## 2014-03-23 LAB — CREATININE, SERUM: Creatine, Serum: 0.75

## 2014-03-24 DIAGNOSIS — C7951 Secondary malignant neoplasm of bone: Secondary | ICD-10-CM | POA: Diagnosis not present

## 2014-03-24 DIAGNOSIS — C50919 Malignant neoplasm of unspecified site of unspecified female breast: Secondary | ICD-10-CM | POA: Diagnosis not present

## 2014-03-27 LAB — CBC CANCER CENTER
Basophil #: 0 x10 3/mm (ref 0.0–0.1)
Basophil %: 0.7 %
EOS ABS: 0 x10 3/mm (ref 0.0–0.7)
Eosinophil %: 0.8 %
HCT: 36.3 % (ref 35.0–47.0)
HGB: 11.8 g/dL — AB (ref 12.0–16.0)
Lymphocyte #: 0.5 x10 3/mm — ABNORMAL LOW (ref 1.0–3.6)
Lymphocyte %: 16.3 %
MCH: 30.2 pg (ref 26.0–34.0)
MCHC: 32.6 g/dL (ref 32.0–36.0)
MCV: 93 fL (ref 80–100)
MONO ABS: 0.5 x10 3/mm (ref 0.2–0.9)
Monocyte %: 14.5 %
NEUTROS PCT: 67.7 %
Neutrophil #: 2.3 x10 3/mm (ref 1.4–6.5)
Platelet: 217 x10 3/mm (ref 150–440)
RBC: 3.91 10*6/uL (ref 3.80–5.20)
RDW: 15.3 % — ABNORMAL HIGH (ref 11.5–14.5)
WBC: 3.3 x10 3/mm — ABNORMAL LOW (ref 3.6–11.0)

## 2014-03-27 LAB — POTASSIUM: Potassium: 4.4 mmol/L

## 2014-03-27 LAB — CREATININE, SERUM
Creatinine: 1.03 mg/dL — ABNORMAL HIGH
GFR CALC NON AF AMER: 57 — AB

## 2014-03-30 LAB — CBC CANCER CENTER
Basophil #: 0.1 x10 3/mm (ref 0.0–0.1)
Basophil %: 2.6 %
Eosinophil #: 0.1 x10 3/mm (ref 0.0–0.7)
Eosinophil %: 1.2 %
HCT: 35.3 % (ref 35.0–47.0)
HGB: 11.8 g/dL — ABNORMAL LOW (ref 12.0–16.0)
LYMPHS PCT: 13.2 %
Lymphocyte #: 0.5 x10 3/mm — ABNORMAL LOW (ref 1.0–3.6)
MCH: 30.8 pg (ref 26.0–34.0)
MCHC: 33.5 g/dL (ref 32.0–36.0)
MCV: 92 fL (ref 80–100)
MONOS PCT: 7.6 %
Monocyte #: 0.3 x10 3/mm (ref 0.2–0.9)
NEUTROS ABS: 3.1 x10 3/mm (ref 1.4–6.5)
Neutrophil %: 75.4 %
Platelet: 225 x10 3/mm (ref 150–440)
RBC: 3.84 10*6/uL (ref 3.80–5.20)
RDW: 15.3 % — ABNORMAL HIGH (ref 11.5–14.5)
WBC: 4.1 x10 3/mm (ref 3.6–11.0)

## 2014-03-31 LAB — CANCER ANTIGEN 27.29: CA 27.29: 190.7 U/mL — AB (ref 0.0–38.6)

## 2014-04-03 ENCOUNTER — Ambulatory Visit
Admit: 2014-04-03 | Disposition: A | Discharge status: Home / Self Care | Payer: Self-pay | Attending: Internal Medicine | Admitting: Internal Medicine

## 2014-04-03 DIAGNOSIS — R978 Other abnormal tumor markers: Secondary | ICD-10-CM | POA: Diagnosis not present

## 2014-04-03 DIAGNOSIS — C50919 Malignant neoplasm of unspecified site of unspecified female breast: Secondary | ICD-10-CM | POA: Diagnosis not present

## 2014-04-03 DIAGNOSIS — C7951 Secondary malignant neoplasm of bone: Secondary | ICD-10-CM | POA: Diagnosis not present

## 2014-04-03 DIAGNOSIS — F419 Anxiety disorder, unspecified: Secondary | ICD-10-CM | POA: Diagnosis not present

## 2014-04-03 DIAGNOSIS — I890 Lymphedema, not elsewhere classified: Secondary | ICD-10-CM | POA: Diagnosis not present

## 2014-04-03 DIAGNOSIS — Z79811 Long term (current) use of aromatase inhibitors: Secondary | ICD-10-CM | POA: Diagnosis not present

## 2014-04-03 DIAGNOSIS — Z79899 Other long term (current) drug therapy: Secondary | ICD-10-CM | POA: Diagnosis not present

## 2014-04-03 DIAGNOSIS — Z170 Estrogen receptor positive status [ER+]: Secondary | ICD-10-CM | POA: Diagnosis not present

## 2014-04-08 LAB — CBC CANCER CENTER
BASOS PCT: 0.7 %
Basophil #: 0 x10 3/mm (ref 0.0–0.1)
EOS PCT: 2 %
Eosinophil #: 0.1 x10 3/mm (ref 0.0–0.7)
HCT: 34.2 % — ABNORMAL LOW (ref 35.0–47.0)
HGB: 11.7 g/dL — AB (ref 12.0–16.0)
LYMPHS ABS: 0.5 x10 3/mm — AB (ref 1.0–3.6)
LYMPHS PCT: 17.6 %
MCH: 31.6 pg (ref 26.0–34.0)
MCHC: 34.2 g/dL (ref 32.0–36.0)
MCV: 93 fL (ref 80–100)
MONOS PCT: 8.3 %
Monocyte #: 0.2 x10 3/mm (ref 0.2–0.9)
Neutrophil #: 2.1 x10 3/mm (ref 1.4–6.5)
Neutrophil %: 71.4 %
Platelet: 175 x10 3/mm (ref 150–440)
RBC: 3.69 10*6/uL — AB (ref 3.80–5.20)
RDW: 16.5 % — AB (ref 11.5–14.5)
WBC: 2.9 x10 3/mm — AB (ref 3.6–11.0)

## 2014-04-15 LAB — CBC CANCER CENTER
BASOS ABS: 0 x10 3/mm (ref 0.0–0.1)
Basophil %: 1.1 %
EOS ABS: 0.1 x10 3/mm (ref 0.0–0.7)
Eosinophil %: 2 %
HCT: 34.6 % — AB (ref 35.0–47.0)
HGB: 11.7 g/dL — ABNORMAL LOW (ref 12.0–16.0)
LYMPHS ABS: 0.6 x10 3/mm — AB (ref 1.0–3.6)
LYMPHS PCT: 21.1 %
MCH: 31.4 pg (ref 26.0–34.0)
MCHC: 33.7 g/dL (ref 32.0–36.0)
MCV: 93 fL (ref 80–100)
MONO ABS: 0.2 x10 3/mm (ref 0.2–0.9)
Monocyte %: 7.5 %
NEUTROS PCT: 68.3 %
Neutrophil #: 1.9 x10 3/mm (ref 1.4–6.5)
Platelet: 159 x10 3/mm (ref 150–440)
RBC: 3.72 10*6/uL — ABNORMAL LOW (ref 3.80–5.20)
RDW: 17.2 % — AB (ref 11.5–14.5)
WBC: 2.9 x10 3/mm — ABNORMAL LOW (ref 3.6–11.0)

## 2014-04-22 LAB — CBC CANCER CENTER
Basophil #: 0 x10 3/mm (ref 0.0–0.1)
Basophil %: 1.1 %
EOS PCT: 1 %
Eosinophil #: 0 x10 3/mm (ref 0.0–0.7)
HCT: 35.2 % (ref 35.0–47.0)
HGB: 11.9 g/dL — AB (ref 12.0–16.0)
Lymphocyte #: 0.7 x10 3/mm — ABNORMAL LOW (ref 1.0–3.6)
Lymphocyte %: 17.6 %
MCH: 31.8 pg (ref 26.0–34.0)
MCHC: 34 g/dL (ref 32.0–36.0)
MCV: 94 fL (ref 80–100)
MONOS PCT: 12.7 %
Monocyte #: 0.5 x10 3/mm (ref 0.2–0.9)
NEUTROS PCT: 67.6 %
Neutrophil #: 2.9 x10 3/mm (ref 1.4–6.5)
PLATELETS: 203 x10 3/mm (ref 150–440)
RBC: 3.75 10*6/uL — AB (ref 3.80–5.20)
RDW: 18.2 % — ABNORMAL HIGH (ref 11.5–14.5)
WBC: 4.2 x10 3/mm (ref 3.6–11.0)

## 2014-04-23 LAB — CANCER ANTIGEN 27.29: CA 27.29: 143.2 U/mL — ABNORMAL HIGH (ref 0.0–38.6)

## 2014-04-24 ENCOUNTER — Other Ambulatory Visit: Payer: Self-pay | Admitting: Internal Medicine

## 2014-04-25 NOTE — Consult Note (Signed)
Reason for Visit: This 64 year old Female patient presents to the clinic for initial evaluation of  stage IV breast cancer .   Referred by Dr. Cynda Acres.  Diagnosis:  Chief Complaint/Diagnosis   64 year old female with wide spread bony metastasis from stage IV breast cancer with significant pain in the thoracic spine for powder treatment to that region.  Pathology Report pathology report reviewed   Imaging Report PET CT scan reviewed   Referral Report clinical notes reviewed   Planned Treatment Regimen palliative radiation to thoracic spine   HPI   patient is a 64 year old female who has noted a mass in her right breast for at least 6 months became ulcerated avoided medical attention based on fears of dealing with the diagnosis and cost. Eventually wound up in the emergency room with significant pain in the narcotic analgesics. Initial examination showed large ulcerated right breast mass. PET CT scan was performed showed widespread bony metastasis throughout her axial skeleton. Biopsy was obtained positive for invasive mammary carcinoma she has significant disease with compression fracture at the T8 vertebral body.biopsy was positive for lobular carcinoma ER/PR status still pending. She is wheelchair-bound has difficulty ambulating secondary to pain.seen today for consideration of palliative treatment of her thoracic spine.  Past Hx:    Hysterectomy:    Knee Surgery - Left:   Past, Family and Social History:  Past Medical History positive   Past Surgical History hysterectomy, knee surgery   Family History positive   Family History Comments aunt with breast cancer father with prostate cancer niece with lung cancer daughter with brain tumor   Social History positive   Social History Comments 40-pack-year smoking history continues to smoke   Additional Past Medical and Surgical History accompanied by multiple family members today   Allergies:   No Known Allergies:   Home Meds:   Home Medications: Medication Instructions Status  fentaNYL 25 mcg/hr transdermal film, extended release 1 patch transdermal every 72 hours, TO BE USED WITH ANOTHER 12 MICROGRAM PATCH FOR TOTAL OF 37 MICROGRAMS Active  oxyCODONE 5 mg oral tablet 2-3 tab(s) orally every 3 hours, As Needed Active  dexamethasone 2 mg oral tablet 1 tab(s) orally 2 times a day Active  silver sulfADIAZINE topical 1% topical cream Apply topically to affected area 2 times a day Active  fentaNYL 12 mcg/hr transdermal film, extended release 1 patch transdermal every 72 hours Active  lactulose 10 g/15 mL oral syrup 15 milliliter(s) orally 2 times a day, TO KEEP BOWELS SOFT, OR 30 MLS PO WITH 8 OUNCES OF WATER Q4HRS PRN FOR CONSTIPATION Active  oxyCODONE 5 mg oral tablet 2 tab(s) orally every 4 hours, As Needed Active  fentaNYL 12 mcg/hr transdermal film, extended release 1 patch transdermal every 72 hours Active   Review of Systems:  Performance Status (ECOG) 1   Skin see HPI   Breast see HPI   Ophthalmologic negative   ENMT negative   Respiratory and Thorax negative   Cardiovascular negative   Gastrointestinal negative   Genitourinary negative   Musculoskeletal negative   Neurological negative   Psychiatric negative   Hematology/Lymphatics negative   Endocrine negative   Allergic/Immunologic negative   Review of Systems   review of systems obtained from nurses notes  Physical Exam:  General/Skin/HEENT:  Skin normal   Eyes normal   ENMT normal   Head and Neck normal   Additional PE well-developed wheelchair-bound female in moderate pain distress. Large ulcerated mass of the right breast. Significant pain  is demonstrated with palpation of her spine. Motor sensory and DTR levels are equal and symmetric in upper and lower extremities. Lungs are clear to A&P cardiac examination shows regular rate and rhythm.   Breasts/Resp/CV/GI/GU:  Respiratory and Thorax normal   Cardiovascular  normal   Gastrointestinal normal   Genitourinary normal   MS/Neuro/Psych/Lymph:  Musculoskeletal normal   Neurological normal   Lymphatics normal   Other Results:  Radiology Results: LabUnknown:    23-Mar-15 15:06, PET/CT Scan Breast CA Stage/Restaging  PACS Image   Nuclear Med:  PET/CT Scan Breast CA Stage/Restaging   REASON FOR EXAM:    staging breast CA  breast mass  COMMENTS:       PROCEDURE: PET - PET/CT RESTG BREAST CA  - Mar 24 2013  3:06PM     CLINICAL DATA:  Initial treatment strategy for breast cancer.    EXAM:  NUCLEAR MEDICINE PET SKULL BASE TO THIGH    TECHNIQUE:  13.1 mCi F-18 FDG was injected intravenously. Full-ring PET imaging  was performed from the skull base to thigh after the radiotracer. CT  data was obtained and used for attenuation correction and anatomic  localization.  FASTING BLOOD GLUCOSE:  Value: 94 mg/dl    COMPARISON:  None.    FINDINGS:  NECK    No hypermetabolic lymph nodes in the neck. CT images show a  retention cyst or polyp in the left maxillary sinus. Postoperative  changes in the right mastoid air cells.    CHEST    Right axillary lymph nodes appear matted, measuring up to  approximately 2.2 x 3.3 cm (CT image 66), with an SUV max of 8.1  (PET image 65). Subareolar right breast mass, with overlying skin  thickening, measures 3.0 x 5.4 cm with an SUV max of 14.7. No  additional areas of abnormal hypermetabolism in the chest.    CT images show atherosclerotic calcification of the arterial  vasculature, including coronary arteries. Heart is at the upper  limits normal in size. No pericardial effusion. A few scattered tiny  pulmonary nodular densities measure 4 mm or less in size, too small  for PET resolution. No pleural fluid. Airway is unremarkable.    ABDOMEN/PELVIS    No abnormal hypermetabolism in the liver, adrenal glands, spleen or  pancreas. No hypermetabolic lymph nodes.  CT images show the liver,  gallbladder, adrenal glands, kidneys,  spleen, pancreas, stomach and bowel to be grossly unremarkable.  Hysterectomy. Atherosclerotic calcification of the arterial  vasculature without abdominal aortic aneurysm. No free fluid.    SKELETON    Lytic metastases are seen throughout the visualized osseous  structures. Index lesion in the right sacrum measures 2.9 cm with an  SUV max of 7.8. T8 and T10 appear partially compressed.     IMPRESSION:  1. Markedly hypermetabolic primary right breast mass with matted  hypermetabolic adenopathy in the right axilla. Extensive  hypermetabolic osseous metastatic disease.  2. Pathologic fractures of T8 and T10.      Electronically Signed    By: Lorin Picket M.D.    On: 03/24/2013 15:47         Verified By: Luretha Rued, M.D.,   Relevent Results:   Relevant Scans and Labs PET CT scan is reviewed   Assessment and Plan: Impression:   widespread metastatic disease from primary breast cancer in 64 year old female with compression fracture T8 and significant disease in the thoracic lumbar spine Plan:   the stomach initiate palliative radiation therapy in  concert her thoracic spine and encompassing as much area of metastatic disease as possible. Case was presented at our weekly breast cancer conference. We'll plan to deliver 3000 cGy in 10 fractions. Based on her pain I have set her up for urgent radiation therapy simulation for tomorrow. We'll hopefully start treatment by the end of this week. Risks and benefits of treatment including skin reactions possible esophagitis, alteration of blood counts and fatigue over explained in detail to the patient and her family.  I would like to take this opportunity for allowing me to participate in the care of your patient..  Electronic Signatures: Armstead Peaks (MD)  (Signed 31-Mar-15 09:01)  Authored: HPI, Diagnosis, Past Hx, PFSH, Allergies, Home Meds, ROS, Physical Exam, Other Results, Relevent  Results, Encounter Assessment and Plan   Last Updated: 31-Mar-15 09:01 by Armstead Peaks (MD)

## 2014-04-25 NOTE — H&P (Signed)
PATIENT NAME:  Elizabeth Bond, Elizabeth Bond MR#:  [id number] DATE OF BIRTH:  [birth year]  DATE OF ADMISSION:  07/23/2013  HISTORY OF PRESENT ILLNESS:  Elizabeth Bond was seen, evaluated, and admitted on July 22 from the clinic. A follow-up note was placed later that evening. This narrative was delayed until today. The patient is well known to me with underlying ER positive PR positive breast cancer, metastatic diffusely, including multiple bones, who is on Herceptin and letrozole. Was in the clinic and then hospitalized with pain out of control, back pain and lateral posterior rib pain, increased weakness, chill that morning, decreased p.o. intake since the night before, loose stools just before hospitalization, and diarrhea the night before.   There was no headache, no dizziness, no visual disturbances, no ear or jaw pain. Some nonproductive cough. No chest pain or retrosternal chest pain. No wheezing or hemoptysis. No abdominal pain. Mild nausea, no vomiting, had 1 episode of diarrhea earlier, with no focal extremity weakness. The patient has some general weakness. The patient has some chronic symmetric lower extremity edema, was actually less than recently. No new rash or bruising. No focal weakness.   PAST MEDICAL HISTORY: Includes in addition to the recent diagnosis of cancer, prior hysterectomy, prior knee replacement, anxiety.   ALLERGIES: No known allergies.   HOME MEDICATIONS: The patient has been on Protonix 40 mg daily, oxycodone 10 mg in the evening and 20 mg in the morning, alprazolam 0.5 mg p.o. 3 times a day, Phospha 250 neutral 1 tablet twice a day. Lasix 20 mg oral Monday, Wednesday, Friday p.r.n. for swelling, along with potassium 10 mEq p.o. daily, as well as Caltrate 1 tablet daily. Letrozole 2.5 mg p.o. daily, oxycodone 5 mg had been taken recently every 4 hours p.r.n. for pain as prescribed, and needed more recently, had taken them every 3 hours in the last few days.   PHYSICAL EXAMINATION:  GENERAL:  Alert, cooperative was anxious. Slight pallor.  HEENT: Sclerae clear. No thrush.  NECK: No mass in the neck.  LYMPH: No palpable nodes in the neck.  LUNGS: Clear with decreased air entry. No wheezing, rales, or rhonchi.  HEART: Regular. The breast has an old ulcerated lesion.  ABDOMEN: Nontender. No palpable mass or organomegaly.  EXTREMITIES: 1+ symmetric ankle and pretibial edema. No calf tenderness.  NEUROLOGIC: Grossly nonfocal.  PSYCHIATRIC: Somewhat anxious, but mood and affect was appropriate and at baseline.   LABORATORY DATA: hemoglobin 14.2, platelets of 363,000. Sodium was 139, potassium 3.9, glucose was 92. Liver functions were unremarkable. Magnesium is 1.9.  Thoracic spine x-ray obtained later showed new superior endplate compression fracture of T6 and L1. There is progressive loss of height of the previous T8 fracture.   IMPRESSION AND PLAN: The patient was hospitalized. In the clinic, she was yelling out in pain and needed more than her previous medications, had been in more pain in the last 2 days, particularly in the midthoracic and lateral posterior area below the scapula with some tenderness in this area. As stated, her appetite was down. She had a chill this morning. She had diarrhea. She was hospitalized, given IV fluids. Initially her blood pressure was high, which came down as we controlled the pain, and IV morphine was given for breakthrough to good effect, then p.o. medication, OxyContin, dose increased to 20 b.i.d., and oxycodone increased to 7.5 mg p.r.n. from 5 mg. Also see progress note later in the evening.   ____________________________ Simonne Come. Inez Pilgrim, MD rgg:jr D: 07/24/2013 19:18:14  ET T: 07/24/2013 20:42:01 ET JOB#: [id number]  cc: Simonne Come. Inez Pilgrim, MD, <Dictator> Dallas Schimke MD ELECTRONICALLY SIGNED 09/11/2013 8:39

## 2014-04-25 NOTE — Op Note (Signed)
PATIENT NAME:  Elizabeth Bond, Elizabeth Bond MR#:  [id number] DATE OF BIRTH:  August 10, 1950  DATE OF PROCEDURE:  06/20/2013  PREOPERATIVE DIAGNOSIS: Carcinoma of the right breast.   POSTOPERATIVE DIAGNOSIS: Carcinoma of the right breast.   OPERATION: Insertion of venous access port with ultrasound and fluoroscopic guidance.   SURGEON: Mckinley Jewel, M.D.   ANESTHESIA: Monitored anesthesia care, local anesthetic containing 0.5% Marcaine and 1% Xylocaine.   COMPLICATIONS: None.   ESTIMATED BLOOD LOSS: Minimal.   DRAINS: None.   DESCRIPTION OF PROCEDURE: The patient was placed in the supine position on the operating room table. With adequate sedation and monitoring, the left upper chest and neck area were prepped and draped out as a sterile field. Ultrasound probe was brought up to the field. Timeout procedure was performed. The subclavian vein was identified beneath the lateral end of the clavicle with the ultrasound and using this as a guide a small incision was made after instillation of local anesthetic. A needle was then successfully positioned into the vein with free withdrawal of blood. Guidewire was then positioned through this needle and Seldinger technique was used. Fluoroscopy was also utilized during this time to allow placement of the catheter going towards the distal superior vena cava. The skin mark was about 21 cm. The subcutaneous pocket was created over the 2nd costal cartilage. This area was infiltrated with local anesthetic. Incision was made and a subcutaneous pocket created with cautery. The catheter was tunneled through to this site, cut to approximate length and affixed to a prefilled port. The port was placed in the pocket and anchored to the underlying fascia with 3 stitches of 2-0 Prolene. It was flushed through with heparinized saline. Fluoroscopy was repeated to ensure that the catheter was still in good position. Subcutaneous tissue was closed with 3-0 Vicryl and the skin with subcuticular  4-0 Vicryl, covered with Dermabond. Procedure was well tolerated. She was then returned to the recovery room in stable condition.   ____________________________ S.Robinette Haines, MD sgs:sb D: 06/20/2013 10:05:01 ET T: 06/20/2013 10:56:06 ET JOB#: [id number]  cc: S.G. Jamal Collin, MD, <Dictator> [facility] Robinette Haines MD ELECTRONICALLY SIGNED 06/23/2013 13:30

## 2014-04-29 DIAGNOSIS — Z170 Estrogen receptor positive status [ER+]: Secondary | ICD-10-CM | POA: Diagnosis not present

## 2014-04-29 DIAGNOSIS — C50919 Malignant neoplasm of unspecified site of unspecified female breast: Secondary | ICD-10-CM | POA: Diagnosis not present

## 2014-04-29 DIAGNOSIS — C7951 Secondary malignant neoplasm of bone: Secondary | ICD-10-CM | POA: Diagnosis not present

## 2014-04-29 DIAGNOSIS — Z79818 Long term (current) use of other agents affecting estrogen receptors and estrogen levels: Secondary | ICD-10-CM | POA: Diagnosis not present

## 2014-04-29 LAB — CBC CANCER CENTER
BASOS ABS: 0.1 x10 3/mm (ref 0.0–0.1)
BASOS PCT: 1.7 %
EOS ABS: 0 x10 3/mm (ref 0.0–0.7)
Eosinophil %: 1.1 %
HCT: 33.8 % — AB (ref 35.0–47.0)
HGB: 11.4 g/dL — ABNORMAL LOW (ref 12.0–16.0)
LYMPHS ABS: 0.6 x10 3/mm — AB (ref 1.0–3.6)
LYMPHS PCT: 15.3 %
MCH: 32 pg (ref 26.0–34.0)
MCHC: 33.7 g/dL (ref 32.0–36.0)
MCV: 95 fL (ref 80–100)
Monocyte #: 0.2 x10 3/mm (ref 0.2–0.9)
Monocyte %: 5.1 %
NEUTROS ABS: 2.8 x10 3/mm (ref 1.4–6.5)
Neutrophil %: 76.8 %
Platelet: 243 x10 3/mm (ref 150–440)
RBC: 3.56 10*6/uL — AB (ref 3.80–5.20)
RDW: 18.9 % — AB (ref 11.5–14.5)
WBC: 3.7 x10 3/mm (ref 3.6–11.0)

## 2014-04-29 LAB — COMPREHENSIVE METABOLIC PANEL
ALBUMIN: 4.1 g/dL
ALT: 12 U/L — AB
AST: 19 U/L
Alkaline Phosphatase: 44 U/L
Anion Gap: 6 — ABNORMAL LOW (ref 7–16)
BILIRUBIN TOTAL: 0.6 mg/dL
BUN: 35 mg/dL — AB
CHLORIDE: 102 mmol/L
CREATININE: 1.26 mg/dL — AB
Calcium, Total: 8.7 mg/dL — ABNORMAL LOW
Co2: 29 mmol/L
EGFR (African American): 52 — ABNORMAL LOW
GFR CALC NON AF AMER: 45 — AB
Glucose: 111 mg/dL — ABNORMAL HIGH
Potassium: 4.1 mmol/L
Sodium: 137 mmol/L
TOTAL PROTEIN: 7.1 g/dL

## 2014-04-29 LAB — PHOSPHORUS: PHOSPHORUS: 4.4 mg/dL

## 2014-05-05 ENCOUNTER — Other Ambulatory Visit: Payer: Self-pay | Admitting: Internal Medicine

## 2014-05-05 DIAGNOSIS — C50911 Malignant neoplasm of unspecified site of right female breast: Secondary | ICD-10-CM

## 2014-05-06 ENCOUNTER — Inpatient Hospital Stay: Payer: Medicare Other | Attending: Internal Medicine

## 2014-05-06 ENCOUNTER — Inpatient Hospital Stay: Payer: Medicare Other

## 2014-05-06 DIAGNOSIS — R978 Other abnormal tumor markers: Secondary | ICD-10-CM | POA: Insufficient documentation

## 2014-05-06 DIAGNOSIS — C50919 Malignant neoplasm of unspecified site of unspecified female breast: Secondary | ICD-10-CM | POA: Insufficient documentation

## 2014-05-06 DIAGNOSIS — C50911 Malignant neoplasm of unspecified site of right female breast: Secondary | ICD-10-CM

## 2014-05-06 DIAGNOSIS — C7951 Secondary malignant neoplasm of bone: Secondary | ICD-10-CM | POA: Diagnosis not present

## 2014-05-06 DIAGNOSIS — Z170 Estrogen receptor positive status [ER+]: Secondary | ICD-10-CM | POA: Diagnosis not present

## 2014-05-06 DIAGNOSIS — Z79899 Other long term (current) drug therapy: Secondary | ICD-10-CM | POA: Diagnosis not present

## 2014-05-06 DIAGNOSIS — I890 Lymphedema, not elsewhere classified: Secondary | ICD-10-CM | POA: Insufficient documentation

## 2014-05-06 DIAGNOSIS — Z79811 Long term (current) use of aromatase inhibitors: Secondary | ICD-10-CM | POA: Insufficient documentation

## 2014-05-06 LAB — CREATININE, SERUM
CREATININE: 0.92 mg/dL (ref 0.44–1.00)
GFR calc Af Amer: >60 mL/min (ref >60)

## 2014-05-12 ENCOUNTER — Other Ambulatory Visit: Payer: Self-pay | Admitting: *Deleted

## 2014-05-12 NOTE — Telephone Encounter (Signed)
Needs refill on oxycodone, Lasix, pantoprazole, Phospha 250, She would like to pick up tomorrow when here for labs

## 2014-05-13 ENCOUNTER — Inpatient Hospital Stay: Payer: Medicare Other

## 2014-05-13 ENCOUNTER — Other Ambulatory Visit: Payer: Self-pay | Admitting: *Deleted

## 2014-05-13 DIAGNOSIS — I890 Lymphedema, not elsewhere classified: Secondary | ICD-10-CM | POA: Diagnosis not present

## 2014-05-13 DIAGNOSIS — Z170 Estrogen receptor positive status [ER+]: Secondary | ICD-10-CM | POA: Diagnosis not present

## 2014-05-13 DIAGNOSIS — C7951 Secondary malignant neoplasm of bone: Secondary | ICD-10-CM | POA: Diagnosis not present

## 2014-05-13 DIAGNOSIS — C50919 Malignant neoplasm of unspecified site of unspecified female breast: Secondary | ICD-10-CM

## 2014-05-13 DIAGNOSIS — R978 Other abnormal tumor markers: Secondary | ICD-10-CM | POA: Diagnosis not present

## 2014-05-13 DIAGNOSIS — Z79811 Long term (current) use of aromatase inhibitors: Secondary | ICD-10-CM | POA: Diagnosis not present

## 2014-05-13 LAB — CBC WITH DIFFERENTIAL/PLATELET
BASOS PCT: 1 %
Basophils Absolute: 0 10*3/uL (ref 0–0.1)
Eosinophils Absolute: 0 10*3/uL (ref 0–0.7)
Eosinophils Relative: 1 %
HCT: 33.3 % — ABNORMAL LOW (ref 35.0–47.0)
HEMOGLOBIN: 11 g/dL — AB (ref 12.0–16.0)
LYMPHS PCT: 20 %
Lymphs Abs: 0.6 10*3/uL — ABNORMAL LOW (ref 1.0–3.6)
MCH: 32.3 pg (ref 26.0–34.0)
MCHC: 33 g/dL (ref 32.0–36.0)
MCV: 98 fL (ref 80.0–100.0)
MONOS PCT: 8 %
Monocytes Absolute: 0.2 10*3/uL (ref 0.2–0.9)
NEUTROS ABS: 2.1 10*3/uL (ref 1.4–6.5)
Neutrophils Relative %: 70 %
Platelets: 137 10*3/uL — ABNORMAL LOW (ref 150–440)
RBC: 3.4 MIL/uL — ABNORMAL LOW (ref 3.80–5.20)
RDW: 19.2 % — AB (ref 11.5–14.5)
WBC: 3 10*3/uL — AB (ref 3.6–11.0)

## 2014-05-13 MED ORDER — K PHOS MONO-SOD PHOS DI & MONO 155-852-130 MG PO TABS: 250.0000 mg | ORAL_TABLET | Freq: Two times a day (BID) | ORAL | Status: DC

## 2014-05-13 MED ORDER — FUROSEMIDE 20 MG PO TABS: ORAL_TABLET | ORAL | Status: DC

## 2014-05-13 MED ORDER — OXYCODONE HCL 5 MG PO CAPS: ORAL_CAPSULE | ORAL | Status: DC

## 2014-05-13 MED ORDER — PANTOPRAZOLE SODIUM 40 MG PO TBEC: 40.0000 mg | DELAYED_RELEASE_TABLET | Freq: Every day | ORAL | Status: DC

## 2014-05-19 ENCOUNTER — Other Ambulatory Visit: Payer: Self-pay

## 2014-05-19 DIAGNOSIS — C50919 Malignant neoplasm of unspecified site of unspecified female breast: Secondary | ICD-10-CM

## 2014-05-20 ENCOUNTER — Encounter (INDEPENDENT_AMBULATORY_CARE_PROVIDER_SITE_OTHER): Payer: Self-pay

## 2014-05-20 ENCOUNTER — Inpatient Hospital Stay: Payer: Medicare Other

## 2014-05-20 ENCOUNTER — Other Ambulatory Visit: Payer: Self-pay | Admitting: *Deleted

## 2014-05-20 DIAGNOSIS — C7951 Secondary malignant neoplasm of bone: Secondary | ICD-10-CM | POA: Diagnosis not present

## 2014-05-20 DIAGNOSIS — C50919 Malignant neoplasm of unspecified site of unspecified female breast: Secondary | ICD-10-CM

## 2014-05-20 DIAGNOSIS — Z170 Estrogen receptor positive status [ER+]: Secondary | ICD-10-CM | POA: Diagnosis not present

## 2014-05-20 DIAGNOSIS — I890 Lymphedema, not elsewhere classified: Secondary | ICD-10-CM | POA: Diagnosis not present

## 2014-05-20 DIAGNOSIS — Z79811 Long term (current) use of aromatase inhibitors: Secondary | ICD-10-CM | POA: Diagnosis not present

## 2014-05-20 DIAGNOSIS — R978 Other abnormal tumor markers: Secondary | ICD-10-CM | POA: Diagnosis not present

## 2014-05-20 LAB — COMPREHENSIVE METABOLIC PANEL
ALT: 10 U/L — ABNORMAL LOW (ref 14–54)
ANION GAP: 4 — AB (ref 5–15)
AST: 18 U/L (ref 15–41)
Albumin: 4 g/dL (ref 3.5–5.0)
Alkaline Phosphatase: 40 U/L (ref 38–126)
BUN: 13 mg/dL (ref 6–20)
CALCIUM: 8.3 mg/dL — AB (ref 8.9–10.3)
CHLORIDE: 105 mmol/L (ref 101–111)
CO2: 27 mmol/L (ref 22–32)
CREATININE: 0.83 mg/dL (ref 0.44–1.00)
GFR calc Af Amer: >60 mL/min (ref >60)
Glucose, Bld: 107 mg/dL — ABNORMAL HIGH (ref 65–99)
Potassium: 4.2 mmol/L (ref 3.5–5.1)
Sodium: 136 mmol/L (ref 135–145)
Total Bilirubin: 0.3 mg/dL (ref 0.3–1.2)
Total Protein: 6.9 g/dL (ref 6.5–8.1)

## 2014-05-20 LAB — CBC WITH DIFFERENTIAL/PLATELET
Basophils Absolute: 0 10*3/uL (ref 0–0.1)
Basophils Relative: 1 %
Eosinophils Absolute: 0 10*3/uL (ref 0–0.7)
Eosinophils Relative: 1 %
HEMATOCRIT: 33.2 % — AB (ref 35.0–47.0)
Hemoglobin: 11 g/dL — ABNORMAL LOW (ref 12.0–16.0)
LYMPHS PCT: 15 %
Lymphs Abs: 0.6 10*3/uL — ABNORMAL LOW (ref 1.0–3.6)
MCH: 32.9 pg (ref 26.0–34.0)
MCHC: 33.2 g/dL (ref 32.0–36.0)
MCV: 98.8 fL (ref 80.0–100.0)
MONO ABS: 0.4 10*3/uL (ref 0.2–0.9)
Monocytes Relative: 10 %
NEUTROS ABS: 2.7 10*3/uL (ref 1.4–6.5)
Neutrophils Relative %: 73 %
Platelets: 189 10*3/uL (ref 150–440)
RBC: 3.36 MIL/uL — AB (ref 3.80–5.20)
RDW: 20.6 % — ABNORMAL HIGH (ref 11.5–14.5)
WBC: 3.7 10*3/uL (ref 3.6–11.0)

## 2014-05-20 LAB — PHOSPHORUS: Phosphorus: 2.9 mg/dL (ref 2.5–4.6)

## 2014-05-20 MED ORDER — FUROSEMIDE 20 MG PO TABS: ORAL_TABLET | ORAL | Status: DC

## 2014-05-20 MED ORDER — POTASSIUM CHLORIDE ER 10 MEQ PO TBCR: 10.0000 meq | EXTENDED_RELEASE_TABLET | Freq: Every day | ORAL | Status: DC

## 2014-05-20 MED ORDER — OXYCODONE HCL ER 20 MG PO T12A: 20.0000 mg | EXTENDED_RELEASE_TABLET | Freq: Two times a day (BID) | ORAL | Status: DC

## 2014-05-20 MED ORDER — PANTOPRAZOLE SODIUM 40 MG PO TBEC: 40.0000 mg | DELAYED_RELEASE_TABLET | Freq: Every day | ORAL | Status: DC

## 2014-05-20 MED ORDER — CALCIUM 600-200 MG-UNIT PO TABS: 1.0000 | ORAL_TABLET | Freq: Two times a day (BID) | ORAL | Status: DC

## 2014-05-20 NOTE — Telephone Encounter (Signed)
Informed that prescription is ready to pick up  

## 2014-05-21 LAB — CA 27.29 (SERIAL MONITOR): CA 27.29: 134.8 U/mL — ABNORMAL HIGH (ref 0.0–38.6)

## 2014-05-27 ENCOUNTER — Inpatient Hospital Stay: Payer: Medicare Other

## 2014-05-27 ENCOUNTER — Inpatient Hospital Stay (HOSPITAL_BASED_OUTPATIENT_CLINIC_OR_DEPARTMENT_OTHER): Payer: Medicare Other | Admitting: Internal Medicine

## 2014-05-27 ENCOUNTER — Other Ambulatory Visit: Payer: Self-pay | Admitting: Family Medicine

## 2014-05-27 VITALS — BP 159/74 | HR 69 | Temp 97.3°F | Wt 120.6 lb

## 2014-05-27 DIAGNOSIS — C7951 Secondary malignant neoplasm of bone: Secondary | ICD-10-CM | POA: Diagnosis not present

## 2014-05-27 DIAGNOSIS — Z79811 Long term (current) use of aromatase inhibitors: Secondary | ICD-10-CM | POA: Diagnosis not present

## 2014-05-27 DIAGNOSIS — Z170 Estrogen receptor positive status [ER+]: Secondary | ICD-10-CM

## 2014-05-27 DIAGNOSIS — C50919 Malignant neoplasm of unspecified site of unspecified female breast: Secondary | ICD-10-CM

## 2014-05-27 DIAGNOSIS — I890 Lymphedema, not elsewhere classified: Secondary | ICD-10-CM | POA: Diagnosis not present

## 2014-05-27 DIAGNOSIS — R978 Other abnormal tumor markers: Secondary | ICD-10-CM | POA: Diagnosis not present

## 2014-05-27 LAB — CBC WITH DIFFERENTIAL/PLATELET
BASOS ABS: 0 10*3/uL (ref 0–0.1)
BASOS PCT: 1 %
EOS PCT: 1 %
Eosinophils Absolute: 0 10*3/uL (ref 0–0.7)
HEMATOCRIT: 34.5 % — AB (ref 35.0–47.0)
Hemoglobin: 11.6 g/dL — ABNORMAL LOW (ref 12.0–16.0)
LYMPHS PCT: 16 %
Lymphs Abs: 0.6 10*3/uL — ABNORMAL LOW (ref 1.0–3.6)
MCH: 33.4 pg (ref 26.0–34.0)
MCHC: 33.7 g/dL (ref 32.0–36.0)
MCV: 99.1 fL (ref 80.0–100.0)
MONO ABS: 0.2 10*3/uL (ref 0.2–0.9)
Monocytes Relative: 4 %
Neutro Abs: 2.9 10*3/uL (ref 1.4–6.5)
Neutrophils Relative %: 78 %
PLATELETS: 267 10*3/uL (ref 150–440)
RBC: 3.48 MIL/uL — AB (ref 3.80–5.20)
RDW: 19.5 % — ABNORMAL HIGH (ref 11.5–14.5)
WBC: 3.7 10*3/uL (ref 3.6–11.0)

## 2014-05-27 MED ORDER — DENOSUMAB 120 MG/1.7ML ~~LOC~~ SOLN
120.0000 mg | Freq: Once | SUBCUTANEOUS | Status: AC
  Administered 2014-05-27: 120 mg via SUBCUTANEOUS
  Filled 2014-05-27: qty 1.7

## 2014-05-27 MED ORDER — FULVESTRANT 250 MG/5ML IM SOLN
500.0000 mg | INTRAMUSCULAR | Status: DC
  Administered 2014-05-27: 500 mg via INTRAMUSCULAR
  Filled 2014-05-27: qty 10

## 2014-05-27 NOTE — Progress Notes (Signed)
[facility]  [phone]) [phone]  Fax:([phone]) [place]: Dec 19, 1950  MR#: [id number]  ZHY#:[id number]  Patient Care Team: Vista Mink, FNP as PCP - General (Family Medicine) Seeplaputhur Robinette Haines, MD (General Surgery) Dallas Schimke, MD (Internal Medicine)  CHIEF COMPLAINT: Breast cancer.   INTERVAL HISTORY: Patient is here for further evaluation and treatment consideration regarding stage IV breast cancer. She denies any fever, chills, nausea, vomiting, diarrhea, cough, shortness of breath, or chest pain. She reports doing very well overall. Has been tolerating her oral chemotherapy regimen well. She is for Faslodex and xgeva today.  REVIEW OF SYSTEMS:   Review of Systems  All other systems reviewed and are negative.   As per HPI. Otherwise, a complete review of systems is negatve.  ONCOLOGY HISTORY:   Breast cancer   06/03/2012 -  Chemotherapy Herceptin and Xgeva    03/02/2013 Initial Diagnosis Breast cancer   04/03/2013 - 04/16/2013 Radiation Therapy T spine    Chemotherapy    01/02/2014 Progression    01/02/2014 -  Chemotherapy Faslodex/ Leslee Home with letrozole and xgeva    PAST MEDICAL HISTORY: Past Medical History  Diagnosis Date  . IBS (irritable bowel syndrome)   . Cancer 2015    breast and bone  . Breast cancer     right    PAST SURGICAL HISTORY: Past Surgical History  Procedure Laterality Date  . Replacement total knee Left 2004-2005?  Marland Kitchen Abdominal hysterectomy  1978  . Portacath placement  2015    FAMILY HISTORY Family History  Problem Relation Age of Onset  . Cancer Father     prostate  . Cancer Maternal Aunt     breast  . Cancer Cousin     breast  . Cancer Other     lung cancer - neice  . Cancer - Other Daughter     brain    GYNECOLOGIC HISTORY:  No LMP recorded. Patient has had a hysterectomy.     ADVANCED DIRECTIVES:    HEALTH MAINTENANCE: History  Substance Use Topics  . Smoking status:  Former Smoker -- 0.25 packs/day for 40 years    Types: Cigarettes    Quit date: 04/02/2013  . Smokeless tobacco: Never Used  . Alcohol Use: Yes     Colonoscopy:  PAP:  Bone density:  Lipid panel:  Allergies  Allergen Reactions  . Fentanyl Other (See Comments)    "loopy and confused"    Current Outpatient Prescriptions  Medication Sig Dispense Refill  . ALPRAZolam (XANAX) 0.5 MG tablet Take 1 tablet by mouth 3 (three) times daily.    . Calcium 600-200 MG-UNIT per tablet Take 1 tablet by mouth 2 (two) times daily. 60 tablet 3  . Cholecalciferol (VITAMIN D3) 5000 UNITS CAPS Take 1 tablet by mouth daily.    . clonazePAM (KLONOPIN) 0.5 MG tablet Take 0.5 mg by mouth 3 (three) times daily as needed for anxiety.    . furosemide (LASIX) 20 MG tablet 1 tab by mouth every other day as needed for edema 30 tablet 2  . letrozole (FEMARA) 2.5 MG tablet Take 2.5 mg by mouth daily.    Marland Kitchen oxycodone (OXY-IR) 5 MG capsule 2 tablets at bedtime as needed 60 capsule 0  . OxyCODONE (OXYCONTIN) 20 mg T12A 12 hr tablet Take 1 tablet (20 mg total) by mouth every 12 (twelve) hours. 60 tablet 0  . OXYCONTIN 10 MG T12A 12 hr tablet Take 2 tablets by mouth  4 (four) times daily as needed.    . palbociclib (IBRANCE) 125 MG capsule Take 125 mg by mouth daily with breakfast. Take whole with food.    . pantoprazole (PROTONIX) 40 MG tablet Take 1 tablet (40 mg total) by mouth daily. 30 tablet 2  . phosphorus (PHOSPHA 250 NEUTRAL) 155-852-130 MG tablet Take 1 tablet (250 mg total) by mouth 2 (two) times daily. 60 tablet 2  . potassium chloride (K-DUR) 10 MEQ tablet Take 1 tablet (10 mEq total) by mouth daily. 30 tablet 2   No current facility-administered medications for this visit.   Facility-Administered Medications Ordered in Other Visits  Medication Dose Route Frequency Provider Last Rate Last Dose  . fulvestrant (FASLODEX) injection 500 mg  500 mg Intramuscular Q30 days Evlyn Kanner, NP   500 mg at 05/27/14  1530    OBJECTIVE: BP 159/74 mmHg  Pulse 69  Temp(Src) 97.3 F (36.3 C) (Tympanic)  Wt 120 lb 9.5 oz (54.701 kg)   Body mass index is 20.07 kg/(m^2).    ECOG FS:0 - Asymptomatic  General: Well-developed, well-nourished, no acute distress. Eyes: Pink conjunctiva, anicteric sclera. HEENT: Normocephalic, moist mucous membranes, clear oropharnyx. Lungs: Clear to auscultation bilaterally. Heart: Regular rate and rhythm. No rubs, murmurs, or gallops. Abdomen: Soft, nontender, nondistended. No organomegaly noted, normoactive bowel sounds. Breast: Breast palpated in a circular manner in the sitting and supine positions.  No masses or fullness palpated.  Axilla palpated in both positions with no masses or fullness palpated.  Musculoskeletal: No edema, cyanosis, or clubbing. Neuro: Alert, answering all questions appropriately. Cranial nerves grossly intact. Skin: No rashes or petechiae noted. Psych: Normal affect. Lymphatics: No cervical, calvicular, axillary or inguinal LAD.   LAB RESULTS:     Component Value Date/Time   NA 136 05/20/2014 1121   NA 137 04/29/2014 0919   K 4.2 05/20/2014 1121   K 4.1 04/29/2014 0919   CL 105 05/20/2014 1121   CL 102 04/29/2014 0919   CO2 27 05/20/2014 1121   CO2 29 04/29/2014 0919   GLUCOSE 107* 05/20/2014 1121   GLUCOSE 111* 04/29/2014 0919   BUN 13 05/20/2014 1121   BUN 35* 04/29/2014 0919   CREATININE 0.83 05/20/2014 1121   CREATININE 1.26* 04/29/2014 0919   CALCIUM 8.3* 05/20/2014 1121   CALCIUM 8.7* 04/29/2014 0919   PROT 6.9 05/20/2014 1121   PROT 7.1 04/29/2014 0919   ALBUMIN 4.0 05/20/2014 1121   ALBUMIN 4.1 04/29/2014 0919   AST 18 05/20/2014 1121   AST 19 04/29/2014 0919   ALT 10* 05/20/2014 1121   ALT 12* 04/29/2014 0919   ALKPHOS 40 05/20/2014 1121   ALKPHOS 44 04/29/2014 0919   BILITOT 0.3 05/20/2014 1121   GFRNONAA >60 05/20/2014 1121   GFRNONAA 45* 04/29/2014 0919   GFRAA >60 05/20/2014 1121   GFRAA 52* 04/29/2014 0919      No results found for: SPEP, UPEP  Lab Results  Component Value Date   WBC 3.7 05/27/2014   NEUTROABS 2.9 05/27/2014   HGB 11.6* 05/27/2014   HCT 34.5* 05/27/2014   MCV 99.1 05/27/2014   PLT 267 05/27/2014      Chemistry      Component Value Date/Time   NA 136 05/20/2014 1121   NA 137 04/29/2014 0919   K 4.2 05/20/2014 1121   K 4.1 04/29/2014 0919   CL 105 05/20/2014 1121   CL 102 04/29/2014 0919   CO2 27 05/20/2014 1121   CO2 29 04/29/2014 0919  BUN 13 05/20/2014 1121   BUN 35* 04/29/2014 0919   CREATININE 0.83 05/20/2014 1121   CREATININE 1.26* 04/29/2014 0919      Component Value Date/Time   CALCIUM 8.3* 05/20/2014 1121   CALCIUM 8.7* 04/29/2014 0919   ALKPHOS 40 05/20/2014 1121   ALKPHOS 44 04/29/2014 0919   AST 18 05/20/2014 1121   AST 19 04/29/2014 0919   ALT 10* 05/20/2014 1121   ALT 12* 04/29/2014 0919   BILITOT 0.3 05/20/2014 1121       Lab Results  Component Value Date   LABCA2 134.8* 05/20/2014    No components found for: QXIHW388  No results for input(s): INR in the last 168 hours.  No results found for: COLORURINE, APPEARANCEUR, LABSPEC, PHURINE, GLUCOSEU, HGBUR, BILIRUBINUR, KETONESUR, PROTEINUR, UROBILINOGEN, NITRITE, LEUKOCYTESUR  STUDIES: No results found.  ASSESSMENT:  Carcinoma of breast, stage IV.  PLAN:   1. Breast ca. Will proceed with scheduled xgeva injection as well as Faslodex. Discussed with her the transfer of her care to another Oncologist and she would prefer Dr. Mike Gip.  Will schedule for patient to have labs drawn again in 3 weeks with tumor markers and then follow up with Dr. Mike Gip in 4 weeks for further discussion of treatment planning.   Patient expressed understanding and was in agreement with this plan. She also understands that She can call clinic at any time with any questions, concerns, or complaints.    Dr. Grayland Ormond was available for consultation and review of plan of care for this  patient.    Evlyn Kanner, NP   05/27/2014 5:27 PM

## 2014-06-02 ENCOUNTER — Other Ambulatory Visit: Payer: Self-pay | Admitting: *Deleted

## 2014-06-02 ENCOUNTER — Other Ambulatory Visit: Payer: Self-pay | Admitting: Family Medicine

## 2014-06-02 MED ORDER — PANTOPRAZOLE SODIUM 40 MG PO TBEC: 40.0000 mg | DELAYED_RELEASE_TABLET | Freq: Every day | ORAL | Status: DC

## 2014-06-02 MED ORDER — CLONAZEPAM 0.5 MG PO TABS: 0.5000 mg | ORAL_TABLET | Freq: Three times a day (TID) | ORAL | Status: DC | PRN

## 2014-06-11 ENCOUNTER — Other Ambulatory Visit: Payer: Self-pay | Admitting: *Deleted

## 2014-06-11 NOTE — Telephone Encounter (Signed)
Needs refill on ibrance.

## 2014-06-11 NOTE — Telephone Encounter (Signed)
Per patients daughter, Baruch Gouty, pt uses [facility] [employer]. [facility] who stated that will reach out to patient to set up delivery for medication. Left message with pt's daughter with this information. Will arrange for port flush on 6/16, since per pt's daughter, port has not been flushed for a few months.

## 2014-06-12 ENCOUNTER — Telehealth: Payer: Self-pay | Admitting: *Deleted

## 2014-06-12 NOTE — Telephone Encounter (Signed)
[facility] is in the process of checking on this

## 2014-06-12 NOTE — Telephone Encounter (Signed)
New prescription faxed to [facility]; pt will be notified regarding shipment.Marland KitchenMarland Kitchen

## 2014-06-12 NOTE — Telephone Encounter (Signed)
  Can you check on this?  M

## 2014-06-12 NOTE — Telephone Encounter (Signed)
Pt's daughter called and stated that [facility] has not called pt to set-up delivery of Ibrance. Would like our office to contact [facility] specialty pharmacy to see if prescription has been processed.

## 2014-06-12 NOTE — Telephone Encounter (Signed)
I called and spoke with pt; who stated that she still has not heard from [facility]. I told her that I will take care of this for her.Marland KitchenMarland Kitchen

## 2014-06-15 ENCOUNTER — Telehealth: Payer: Self-pay | Admitting: *Deleted

## 2014-06-15 NOTE — Telephone Encounter (Signed)
Pt's prescription for Ibrance faxed to [facility] at [phone]. Her current authorization will expire on 06/23/14. [facility] faxed me a new a prior authorization form to submit for Elizabeth Bond.Marland KitchenMarland Kitchen

## 2014-06-16 ENCOUNTER — Telehealth: Payer: Self-pay | Admitting: *Deleted

## 2014-06-16 NOTE — Telephone Encounter (Signed)
msg to [facility]

## 2014-06-16 NOTE — Telephone Encounter (Signed)
Prior auth for [place] faxed to [facility] at [phone] with an approval received for one year from today's date. And a prior auth was faxed to [facility] (drug benefit portion) at [phone].Marland KitchenMarland Kitchen

## 2014-06-18 ENCOUNTER — Inpatient Hospital Stay: Payer: Medicare Other

## 2014-06-18 ENCOUNTER — Inpatient Hospital Stay: Payer: Medicare Other | Attending: Family Medicine

## 2014-06-18 ENCOUNTER — Other Ambulatory Visit: Payer: No Typology Code available for payment source

## 2014-06-18 DIAGNOSIS — C50911 Malignant neoplasm of unspecified site of right female breast: Secondary | ICD-10-CM | POA: Insufficient documentation

## 2014-06-18 DIAGNOSIS — Z87891 Personal history of nicotine dependence: Secondary | ICD-10-CM | POA: Diagnosis not present

## 2014-06-18 DIAGNOSIS — C7951 Secondary malignant neoplasm of bone: Secondary | ICD-10-CM | POA: Insufficient documentation

## 2014-06-18 DIAGNOSIS — I890 Lymphedema, not elsewhere classified: Secondary | ICD-10-CM | POA: Insufficient documentation

## 2014-06-18 DIAGNOSIS — Z79899 Other long term (current) drug therapy: Secondary | ICD-10-CM | POA: Diagnosis not present

## 2014-06-18 DIAGNOSIS — Z452 Encounter for adjustment and management of vascular access device: Secondary | ICD-10-CM | POA: Insufficient documentation

## 2014-06-18 DIAGNOSIS — Z170 Estrogen receptor positive status [ER+]: Secondary | ICD-10-CM | POA: Insufficient documentation

## 2014-06-18 DIAGNOSIS — Z79818 Long term (current) use of other agents affecting estrogen receptors and estrogen levels: Secondary | ICD-10-CM | POA: Insufficient documentation

## 2014-06-18 DIAGNOSIS — C50919 Malignant neoplasm of unspecified site of unspecified female breast: Secondary | ICD-10-CM

## 2014-06-18 DIAGNOSIS — C801 Malignant (primary) neoplasm, unspecified: Secondary | ICD-10-CM

## 2014-06-18 LAB — COMPREHENSIVE METABOLIC PANEL
ALT: 10 U/L — ABNORMAL LOW (ref 14–54)
ANION GAP: 6 (ref 5–15)
AST: 21 U/L (ref 15–41)
Albumin: 4 g/dL (ref 3.5–5.0)
Alkaline Phosphatase: 39 U/L (ref 38–126)
BILIRUBIN TOTAL: 0.5 mg/dL (ref 0.3–1.2)
BUN: 20 mg/dL (ref 6–20)
CALCIUM: 8.3 mg/dL — AB (ref 8.9–10.3)
CO2: 27 mmol/L (ref 22–32)
CREATININE: 0.81 mg/dL (ref 0.44–1.00)
Chloride: 104 mmol/L (ref 101–111)
GFR calc Af Amer: >60 mL/min (ref >60)
GFR calc non Af Amer: >60 mL/min (ref >60)
Glucose, Bld: 101 mg/dL — ABNORMAL HIGH (ref 65–99)
POTASSIUM: 3.7 mmol/L (ref 3.5–5.1)
SODIUM: 137 mmol/L (ref 135–145)
Total Protein: 7.1 g/dL (ref 6.5–8.1)

## 2014-06-18 LAB — CBC WITH DIFFERENTIAL/PLATELET
Basophils Absolute: 0 10*3/uL (ref 0–0.1)
Basophils Relative: 1 %
Eosinophils Absolute: 0 10*3/uL (ref 0–0.7)
Eosinophils Relative: 1 %
HEMATOCRIT: 35 % (ref 35.0–47.0)
Hemoglobin: 11.7 g/dL — ABNORMAL LOW (ref 12.0–16.0)
LYMPHS PCT: 16 %
Lymphs Abs: 0.6 10*3/uL — ABNORMAL LOW (ref 1.0–3.6)
MCH: 34.3 pg — ABNORMAL HIGH (ref 26.0–34.0)
MCHC: 33.5 g/dL (ref 32.0–36.0)
MCV: 102.2 fL — AB (ref 80.0–100.0)
MONO ABS: 0.5 10*3/uL (ref 0.2–0.9)
Monocytes Relative: 12 %
Neutro Abs: 2.8 10*3/uL (ref 1.4–6.5)
Neutrophils Relative %: 70 %
Platelets: 200 10*3/uL (ref 150–440)
RBC: 3.42 MIL/uL — ABNORMAL LOW (ref 3.80–5.20)
RDW: 17.4 % — ABNORMAL HIGH (ref 11.5–14.5)
WBC: 4 10*3/uL (ref 3.6–11.0)

## 2014-06-18 MED ORDER — HEPARIN SOD (PORK) LOCK FLUSH 100 UNIT/ML IV SOLN
500.0000 [IU] | Freq: Once | INTRAVENOUS | Status: DC
  Filled 2014-06-18: qty 5

## 2014-06-18 MED ORDER — SODIUM CHLORIDE 0.9 % IJ SOLN
10.0000 mL | Freq: Once | INTRAMUSCULAR | Status: DC
  Filled 2014-06-18: qty 10

## 2014-06-19 ENCOUNTER — Other Ambulatory Visit: Payer: Self-pay | Admitting: Family Medicine

## 2014-06-19 ENCOUNTER — Telehealth: Payer: Self-pay | Admitting: *Deleted

## 2014-06-19 LAB — CA 27.29 (SERIAL MONITOR): CA 27.29: 286 U/mL — ABNORMAL HIGH (ref 0.0–38.6)

## 2014-06-19 NOTE — Telephone Encounter (Signed)
I faxed pt's prescription, this time, to [facility] at [phone]. Fax included prescription, and copies of pt's Medicare, [employer] preferred Rx Plan ([place]) card, and Carelink from [facility] card. I also included an authorization notification of approval from [facility] regarding her Ibrance from 06/16/2014 to 06/16/2015.... Pt's daughter, Donella Stade was also updated.Marland KitchenMarland Kitchen

## 2014-06-22 ENCOUNTER — Telehealth: Payer: Self-pay | Admitting: *Deleted

## 2014-06-22 ENCOUNTER — Other Ambulatory Visit: Payer: Self-pay | Admitting: *Deleted

## 2014-06-22 DIAGNOSIS — C50919 Malignant neoplasm of unspecified site of unspecified female breast: Secondary | ICD-10-CM

## 2014-06-22 MED ORDER — VITAMIN D3 125 MCG (5000 UT) PO CAPS: 5000.0000 [IU] | ORAL_CAPSULE | Freq: Every day | ORAL | Status: DC

## 2014-06-22 MED ORDER — OXYCODONE HCL ER 20 MG PO T12A
20.0000 mg | EXTENDED_RELEASE_TABLET | Freq: Two times a day (BID) | ORAL | Status: DC
Start: 2014-06-22 — End: 2014-07-21

## 2014-06-22 MED ORDER — K PHOS MONO-SOD PHOS DI & MONO 155-852-130 MG PO TABS: 250.0000 mg | ORAL_TABLET | Freq: Two times a day (BID) | ORAL | Status: DC

## 2014-06-22 MED ORDER — POTASSIUM CHLORIDE ER 10 MEQ PO TBCR: 10.0000 meq | EXTENDED_RELEASE_TABLET | Freq: Every day | ORAL | Status: DC

## 2014-06-22 MED ORDER — CALCIUM 600-200 MG-UNIT PO TABS: 1.0000 | ORAL_TABLET | Freq: Two times a day (BID) | ORAL | Status: DC

## 2014-06-22 MED ORDER — FUROSEMIDE 20 MG PO TABS: ORAL_TABLET | ORAL | Status: DC

## 2014-06-22 MED ORDER — OXYCODONE HCL 5 MG PO CAPS
ORAL_CAPSULE | ORAL | Status: DC
Start: 2014-06-22 — End: 2014-09-15

## 2014-06-22 NOTE — Telephone Encounter (Signed)
Informed that prescription is ready to pick up  

## 2014-06-22 NOTE — Telephone Encounter (Signed)
I relayed this message to both Ms. Elizabeth Bond and her daughter Elizabeth Bond. Also, she will need co-pay assistance as her portion will be $7,000. [place] discussed their $10 copay card with pt; and I initiated a [place] form for pt; in case she decided to proceed with this program...the patient has a scheduled visit with treatment on 06/25/14.Marland KitchenMarland Kitchen

## 2014-06-24 ENCOUNTER — Other Ambulatory Visit: Payer: Self-pay | Admitting: *Deleted

## 2014-06-24 DIAGNOSIS — C50919 Malignant neoplasm of unspecified site of unspecified female breast: Secondary | ICD-10-CM

## 2014-06-25 ENCOUNTER — Encounter: Payer: Self-pay | Admitting: Hematology and Oncology

## 2014-06-25 ENCOUNTER — Inpatient Hospital Stay: Payer: Medicare Other

## 2014-06-25 ENCOUNTER — Inpatient Hospital Stay (HOSPITAL_BASED_OUTPATIENT_CLINIC_OR_DEPARTMENT_OTHER): Payer: Medicare Other | Admitting: Hematology and Oncology

## 2014-06-25 VITALS — BP 156/80 | HR 71 | Temp 97.6°F | Resp 16 | Wt 123.0 lb

## 2014-06-25 VITALS — Temp 97.6°F

## 2014-06-25 DIAGNOSIS — Z79818 Long term (current) use of other agents affecting estrogen receptors and estrogen levels: Secondary | ICD-10-CM

## 2014-06-25 DIAGNOSIS — C7951 Secondary malignant neoplasm of bone: Secondary | ICD-10-CM | POA: Diagnosis not present

## 2014-06-25 DIAGNOSIS — Z452 Encounter for adjustment and management of vascular access device: Secondary | ICD-10-CM | POA: Diagnosis not present

## 2014-06-25 DIAGNOSIS — I890 Lymphedema, not elsewhere classified: Secondary | ICD-10-CM

## 2014-06-25 DIAGNOSIS — C50911 Malignant neoplasm of unspecified site of right female breast: Secondary | ICD-10-CM | POA: Diagnosis not present

## 2014-06-25 DIAGNOSIS — Z170 Estrogen receptor positive status [ER+]: Secondary | ICD-10-CM | POA: Diagnosis not present

## 2014-06-25 DIAGNOSIS — C50919 Malignant neoplasm of unspecified site of unspecified female breast: Secondary | ICD-10-CM

## 2014-06-25 DIAGNOSIS — Z79899 Other long term (current) drug therapy: Secondary | ICD-10-CM

## 2014-06-25 DIAGNOSIS — Z87891 Personal history of nicotine dependence: Secondary | ICD-10-CM

## 2014-06-25 MED ORDER — DENOSUMAB 120 MG/1.7ML ~~LOC~~ SOLN
120.0000 mg | Freq: Once | SUBCUTANEOUS | Status: AC
  Administered 2014-06-25: 120 mg via SUBCUTANEOUS
  Filled 2014-06-25: qty 1.7

## 2014-06-25 MED ORDER — FULVESTRANT 250 MG/5ML IM SOLN
500.0000 mg | INTRAMUSCULAR | Status: DC
  Administered 2014-06-25: 500 mg via INTRAMUSCULAR

## 2014-06-25 NOTE — Progress Notes (Signed)
[facility] day:  06/25/2014  Chief Complaint: DANAHI REDDISH is a 64 y.o. female with metastatic Her2/neu positive breast cancer who is seen for reassessment.  HPI:  The patient was aware of a right breast mass for 6 months to 1 year prior to seeking medical attention.  At the time of diagnosis in 03/2013, the mass was ulcerated and fixed to the chest wall. 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  Tumor was ER/PR positive and HER-2/neu positive. PET scan revealed bone metastasis. She had back pain at T10 and T8 associated with metastasis and compression fracture. She received palliative radiation to her back.  She declined chemotherapy. Initially but began letrozole and Tykerb. Tykerb was discontinued after 1 dose. Letrozole was started on 05/02/2013 .  Review of chemotherapy records reveal that she received Herceptin from 06/02/2013 until 12/15/2013. Letrozole was discontinued on 01/07/2014.  She has received fulvestrant from 01/07/2014 until 04/29/2014.  She has received Xgeva monthly from  03/31/2013 until 05/27/2014.  Notes indicate Herceptin was discontinued for a rising CA-27-29.  CA27.29 was 916.3 on 03/19/2013, 286.2 on 06/23/2013, 70.8 on 08/25/2013, 56.3 on 10/08/2013, 102.7 on 12/15/2013, 149.4 on 01/07/2014, 178.0 on 02/04/2014, 190.7 on 03/30/2014, 143.2 on 04/22/2014, and 134.8 on 05/20/2014.   Leslee Home was started on 02/23/2014.  Her next cycle starts Monday, 06/29/2014.  Symptomatically, she feels great.  She has chronic lymphedema in her right arm.  It has been awhile since her last scans.    Past Medical History  Diagnosis Date  . IBS (irritable bowel syndrome)   . Cancer 2015    breast and bone  . Breast cancer     right    Past Surgical History  Procedure Laterality Date  . Replacement total knee Left 2004-2005?  Marland Kitchen Abdominal hysterectomy  1978  . Portacath placement  2015    Family History  Problem Relation  Age of Onset  . Cancer Father     prostate  . Cancer Maternal Aunt     breast  . Cancer Cousin     breast  . Cancer Other     lung cancer - neice  . Cancer - Other Daughter     brain    Social History:  reports that she quit smoking about 14 months ago. Her smoking use included Cigarettes. She has a 10 pack-year smoking history. She has never used smokeless tobacco. She reports that she drinks alcohol. She reports that she does not use illicit drugs.  The patient is alone today.  Allergies:  Allergies  Allergen Reactions  . Fentanyl Other (See Comments)    "loopy and confused"    Current Medications: Current Outpatient Prescriptions  Medication Sig Dispense Refill  . acetaminophen (TYLENOL) 500 MG tablet Take 1,000 mg by mouth every 8 (eight) hours as needed for headache.    . ALPRAZolam (XANAX) 0.5 MG tablet Take 1 tablet by mouth 3 (three) times daily.    . Calcium 600-200 MG-UNIT per tablet Take 1 tablet by mouth 2 (two) times daily. 60 tablet 3  . Cholecalciferol (VITAMIN D3) 5000 UNITS CAPS Take 1 capsule (5,000 Units total) by mouth daily. 30 capsule 3  . clonazePAM (KLONOPIN) 0.5 MG tablet Take 1 tablet (0.5 mg total) by mouth 3 (three) times daily as needed for anxiety. 90 tablet 0  . oxycodone (OXY-IR) 5 MG capsule Take 1 -2 tablets every 4 hours by mouth as needed 60 capsule 0  . OxyCODONE (  OXYCONTIN) 20 mg T12A 12 hr tablet Take 1 tablet (20 mg total) by mouth every 12 (twelve) hours. 60 tablet 0  . palbociclib (IBRANCE) 125 MG capsule Take 125 mg by mouth daily with breakfast. Take whole with food.    . pantoprazole (PROTONIX) 40 MG tablet Take 1 tablet (40 mg total) by mouth daily. 30 tablet 2  . phosphorus (PHOSPHA 250 NEUTRAL) 155-852-130 MG tablet Take 1 tablet (250 mg total) by mouth 2 (two) times daily. 60 tablet 2  . potassium chloride (K-DUR) 10 MEQ tablet Take 1 tablet (10 mEq total) by mouth daily. 30 tablet 2  . furosemide (LASIX) 20 MG tablet 1 tab by mouth  every other day as needed for edema (Patient not taking: Reported on 06/25/2014) 30 tablet 2  . letrozole (FEMARA) 2.5 MG tablet Take 2.5 mg by mouth daily.     No current facility-administered medications for this visit.    Review of Systems:  GENERAL:  Feels great.  Active.  No fevers, sweats or weight loss. PERFORMANCE STATUS (ECOG):  0 HEENT:  No visual changes, runny nose, sore throat, mouth sores or tenderness. Lungs: No shortness of breath or cough.  No hemoptysis. Cardiac:  No chest pain, palpitations, orthopnea, or PND. GI:  Eating up and down ("what I want, when I want").  She eats butter pecan ice cream nightly.  No nausea, vomiting, diarrhea, constipation, melena or hematochezia.  Unaware of when she had her last colonoscopy. GU:  No urgency, frequency, dysuria, or hematuria. Musculoskeletal:  No back pain.  No joint pain.  No muscle tenderness. Extremities:  No pain or swelling. Skin:  No rashes or skin changes. Neuro:  No headache, numbness or weakness, balance or coordination issues. Endocrine:  No diabetes, thyroid issues, hot flashes or night sweats. Psych:  No mood changes, depression or anxiety. Pain:  No focal pain. Review of systems:  All other systems reviewed and found to be negative.   Physical Exam: Blood pressure 156/80, pulse 71, temperature 97.6 F (36.4 C), temperature source Tympanic, resp. rate 16, weight 123 lb 0.3 oz (55.8 kg). GENERAL:  Well developed, well nourished, sitting comfortably in the exam room in no acute distress. MENTAL STATUS:  Alert and oriented to person, place and time. HEAD:  Short gray hair.  Normocephalic, atraumatic, face symmetric, no Cushingoid features. EYES: Hazel eyes.  Pupils equal round and reactive to light and accomodation.  No conjunctivitis or scleral icterus. ENT:  Oropharynx clear without lesion.  Tongue normal. Mucous membranes moist.  RESPIRATORY:  Clear to auscultation without rales, wheezes or  rhonchi. CARDIOVASCULAR:  Regular rate and rhythm without murmur, rub or gallop. BREAST:  Right breast with well healed incision fixed to chest wall.  No discrete masses, skin changes or nipple discharge.  Left breast without masses, skin changes or nipple discharge. ABDOMEN:  Soft, non-tender, with active bowel sounds, and no hepatosplenomegaly.  No masses. BACK:  No tenderness on percussion of the back or rib cage. SKIN:  No rashes, ulcers or lesions. EXTREMITIES: No edema, no skin discoloration or tenderness.  No palpable cords. LYMPH NODES: No palpable cervical, supraclavicular, axillary or inguinal adenopathy  NEUROLOGICAL: Unremarkable. PSYCH:  Appropriate.   No visits with results within 3 Day(s) from this visit. Latest known visit with results is:  Appointment on 06/18/2014  Component Date Value Ref Range Status  . Sodium 06/18/2014 137  135 - 145 mmol/L Final  . Potassium 06/18/2014 3.7  3.5 - 5.1 mmol/L Final  .  Chloride 06/18/2014 104  101 - 111 mmol/L Final  . CO2 06/18/2014 27  22 - 32 mmol/L Final  . Glucose, Bld 06/18/2014 101* 65 - 99 mg/dL Final  . BUN 06/18/2014 20  6 - 20 mg/dL Final  . Creatinine, Ser 06/18/2014 0.81  0.44 - 1.00 mg/dL Final  . Calcium 06/18/2014 8.3* 8.9 - 10.3 mg/dL Final  . Total Protein 06/18/2014 7.1  6.5 - 8.1 g/dL Final  . Albumin 06/18/2014 4.0  3.5 - 5.0 g/dL Final  . AST 06/18/2014 21  15 - 41 U/L Final  . ALT 06/18/2014 10* 14 - 54 U/L Final  . Alkaline Phosphatase 06/18/2014 39  38 - 126 U/L Final  . Total Bilirubin 06/18/2014 0.5  0.3 - 1.2 mg/dL Final  . GFR calc non Af Amer 06/18/2014 >60  >60 mL/min Final  . GFR calc Af Amer 06/18/2014 >60  >60 mL/min Final   Comment: (NOTE) The eGFR has been calculated using the CKD EPI equation. This calculation has not been validated in all clinical situations. eGFR's persistently <60 mL/min signify possible Chronic Kidney Disease.   . Anion gap 06/18/2014 6  5 - 15 Final  . WBC 06/18/2014  4.0  3.6 - 11.0 K/uL Final   A-LINE DRAW  . RBC 06/18/2014 3.42* 3.80 - 5.20 MIL/uL Final  . Hemoglobin 06/18/2014 11.7* 12.0 - 16.0 g/dL Final  . HCT 06/18/2014 35.0  35.0 - 47.0 % Final  . MCV 06/18/2014 102.2* 80.0 - 100.0 fL Final  . MCH 06/18/2014 34.3* 26.0 - 34.0 pg Final  . MCHC 06/18/2014 33.5  32.0 - 36.0 g/dL Final  . RDW 06/18/2014 17.4* 11.5 - 14.5 % Final  . Platelets 06/18/2014 200  150 - 440 K/uL Final  . Neutrophils Relative % 06/18/2014 70   Final  . Neutro Abs 06/18/2014 2.8  1.4 - 6.5 K/uL Final  . Lymphocytes Relative 06/18/2014 16   Final  . Lymphs Abs 06/18/2014 0.6* 1.0 - 3.6 K/uL Final  . Monocytes Relative 06/18/2014 12   Final  . Monocytes Absolute 06/18/2014 0.5  0.2 - 0.9 K/uL Final  . Eosinophils Relative 06/18/2014 1   Final  . Eosinophils Absolute 06/18/2014 0.0  0 - 0.7 K/uL Final  . Basophils Relative 06/18/2014 1   Final  . Basophils Absolute 06/18/2014 0.0  0 - 0.1 K/uL Final  . CA 27.29 06/18/2014 286.0* 0.0 - 38.6 U/mL Final   Comment: (NOTE) [employer]ayer Centaur/ACS methodology Performed At: [facility] [address] [place], [place] [id number] Lindon Romp MD MB:[id number]     Assessment:  SAMEEHA ROCKEFELLER is a 64 y.o. female with metastatic Her2/neu positive right breast cancer.  She initially presented in 03/2013 with an ulcerated breast mass and back pain.  Breast biopsy revealed lobular breast cancer.  Tumor was ER/PR positive and HER-2/neu positive. PET scan revealed bone metastasis. She received palliative radiation to T8 and T10 from 04/02-04/15/2015.  She declined chemotherapy. She initially began letrozole and Tykerb. Tykerb was discontinued after 1 dose. She received letrozole from 05/02/2013 until 01/07/2014 .  She received Herceptin from 06/02/2013 until 12/15/2013. She has received fulvestrant monthly from 01/07/2014 until 05/27/2014.  She has received Xgeva monthly from  03/31/2013 until 05/27/2014.  Notes indicate Herceptin  was discontinued for a rising CA-27-29.  CA27.29 was 916.3 on 03/19/2013, 286.2 on 06/23/2013, 70.8 on 08/25/2013, 56.3 on 10/08/2013, 102.7 on 12/15/2013, 149.4 on 01/07/2014, 178.0 on 02/04/2014, 190.7 on 03/30/2014, 143.2 on 04/22/2014, and 134.8  on 05/20/2014.   Leslee Home was started on 02/23/2014.  Her next cycle starts Monday, 06/29/2014.  Symptomatically, she feels great.  She has chronic lymphedema in her right arm.  It has been awhile since her last scans.    Plan: 1.  Review entire medical history, diagnosis of breast cancer and treatment to date. 2.  Faslodex and Xgeva today. 3.  Anticipate follow-up PET scan for new baseline and rising markers. 4.  RTC in 2 weeks for labs (CBC with diff, CA27.29) 5.  RTC in 4 weeks for MD assess, labs (CBC with diff, CMP, Mg, CA27.29) + Xgeva + Faslodex.   Lequita Asal, MD  06/25/2014, 10:18 AM

## 2014-06-30 ENCOUNTER — Telehealth: Payer: Self-pay | Admitting: Hematology and Oncology

## 2014-06-30 NOTE — Telephone Encounter (Signed)
-----   Message from Evlyn Kanner, NP sent at 06/19/2014 12:46 PM EDT -----   ----- Message -----    From: [facility] [place]: 06/18/2014   9:57 AM      To: Evlyn Kanner, NP

## 2014-06-30 NOTE — Telephone Encounter (Signed)
Re:  CA27.29  Discussed with patient her increasing CA27.29.  Test will be repeated next week.  Currently she is in the midst of her next cycle of Ibrance,.  If her repeat level is still high, we discussed a PET scan.  The patient felt comfortable with this plan.  Lequita Asal, MD

## 2014-07-09 ENCOUNTER — Inpatient Hospital Stay: Payer: Medicare Other | Attending: Hematology and Oncology

## 2014-07-09 DIAGNOSIS — C7951 Secondary malignant neoplasm of bone: Secondary | ICD-10-CM | POA: Diagnosis not present

## 2014-07-09 DIAGNOSIS — C50919 Malignant neoplasm of unspecified site of unspecified female breast: Secondary | ICD-10-CM

## 2014-07-09 DIAGNOSIS — Z170 Estrogen receptor positive status [ER+]: Secondary | ICD-10-CM | POA: Insufficient documentation

## 2014-07-09 DIAGNOSIS — C50911 Malignant neoplasm of unspecified site of right female breast: Secondary | ICD-10-CM | POA: Insufficient documentation

## 2014-07-09 DIAGNOSIS — Z79818 Long term (current) use of other agents affecting estrogen receptors and estrogen levels: Secondary | ICD-10-CM | POA: Insufficient documentation

## 2014-07-09 LAB — COMPREHENSIVE METABOLIC PANEL
ALT: 10 U/L — ABNORMAL LOW (ref 14–54)
AST: 18 U/L (ref 15–41)
Albumin: 3.9 g/dL (ref 3.5–5.0)
Alkaline Phosphatase: 42 U/L (ref 38–126)
Anion gap: 3 — ABNORMAL LOW (ref 5–15)
BUN: 20 mg/dL (ref 6–20)
CO2: 29 mmol/L (ref 22–32)
Calcium: 8.5 mg/dL — ABNORMAL LOW (ref 8.9–10.3)
Chloride: 104 mmol/L (ref 101–111)
Creatinine, Ser: 0.93 mg/dL (ref 0.44–1.00)
GFR calc Af Amer: >60 mL/min (ref >60)
GFR calc non Af Amer: >60 mL/min (ref >60)
Glucose, Bld: 85 mg/dL (ref 65–99)
Potassium: 4.5 mmol/L (ref 3.5–5.1)
Sodium: 136 mmol/L (ref 135–145)
Total Bilirubin: 0.6 mg/dL (ref 0.3–1.2)
Total Protein: 6.9 g/dL (ref 6.5–8.1)

## 2014-07-09 LAB — CBC WITH DIFFERENTIAL/PLATELET
Basophils Absolute: 0 10*3/uL (ref 0–0.1)
Basophils Relative: 1 %
Eosinophils Absolute: 0 10*3/uL (ref 0–0.7)
Eosinophils Relative: 1 %
HCT: 33.4 % — ABNORMAL LOW (ref 35.0–47.0)
Hemoglobin: 11.1 g/dL — ABNORMAL LOW (ref 12.0–16.0)
Lymphocytes Relative: 17 %
Lymphs Abs: 0.6 10*3/uL — ABNORMAL LOW (ref 1.0–3.6)
MCH: 33.9 pg (ref 26.0–34.0)
MCHC: 33.1 g/dL (ref 32.0–36.0)
MCV: 102.6 fL — ABNORMAL HIGH (ref 80.0–100.0)
Monocytes Absolute: 0.1 10*3/uL — ABNORMAL LOW (ref 0.2–0.9)
Monocytes Relative: 4 %
Neutro Abs: 2.8 10*3/uL (ref 1.4–6.5)
Neutrophils Relative %: 77 %
Platelets: 193 10*3/uL (ref 150–440)
RBC: 3.26 MIL/uL — ABNORMAL LOW (ref 3.80–5.20)
RDW: 14.7 % — ABNORMAL HIGH (ref 11.5–14.5)
WBC: 3.6 10*3/uL (ref 3.6–11.0)

## 2014-07-09 LAB — PHOSPHORUS: Phosphorus: 4.1 mg/dL (ref 2.5–4.6)

## 2014-07-10 ENCOUNTER — Telehealth: Payer: Self-pay | Admitting: *Deleted

## 2014-07-10 LAB — CANCER ANTIGEN 27.29: CA 27.29: 248.3 U/mL — ABNORMAL HIGH (ref 0.0–38.6)

## 2014-07-10 NOTE — Telephone Encounter (Signed)
Advised that she still has refills on her rx and she said she will call pharmacy

## 2014-07-14 ENCOUNTER — Other Ambulatory Visit: Payer: Self-pay | Admitting: *Deleted

## 2014-07-14 MED ORDER — CLONAZEPAM 0.5 MG PO TABS: 0.5000 mg | ORAL_TABLET | Freq: Three times a day (TID) | ORAL | Status: DC | PRN

## 2014-07-20 ENCOUNTER — Telehealth: Payer: Self-pay | Admitting: *Deleted

## 2014-07-21 ENCOUNTER — Other Ambulatory Visit: Payer: Self-pay

## 2014-07-21 ENCOUNTER — Telehealth: Payer: Self-pay | Admitting: *Deleted

## 2014-07-21 DIAGNOSIS — C50919 Malignant neoplasm of unspecified site of unspecified female breast: Secondary | ICD-10-CM

## 2014-07-21 MED ORDER — PALBOCICLIB 125 MG PO CAPS: 125.0000 mg | ORAL_CAPSULE | Freq: Every day | ORAL | Status: DC

## 2014-07-21 MED ORDER — OXYCODONE HCL ER 20 MG PO T12A: 20.0000 mg | EXTENDED_RELEASE_TABLET | Freq: Two times a day (BID) | ORAL | Status: DC

## 2014-07-21 NOTE — Telephone Encounter (Signed)
Informed that prescription is ready to pick up  

## 2014-07-21 NOTE — Telephone Encounter (Signed)
  Is this set up for refill?  M

## 2014-07-23 ENCOUNTER — Other Ambulatory Visit: Payer: No Typology Code available for payment source

## 2014-07-23 ENCOUNTER — Ambulatory Visit: Payer: No Typology Code available for payment source

## 2014-07-23 ENCOUNTER — Ambulatory Visit: Payer: No Typology Code available for payment source | Admitting: Hematology and Oncology

## 2014-07-24 ENCOUNTER — Other Ambulatory Visit: Payer: Self-pay | Admitting: Hematology and Oncology

## 2014-07-24 DIAGNOSIS — C50919 Malignant neoplasm of unspecified site of unspecified female breast: Secondary | ICD-10-CM

## 2014-07-24 MED ORDER — PALBOCICLIB 125 MG PO CAPS
125.0000 mg | ORAL_CAPSULE | Freq: Every day | ORAL | Status: DC
Start: 2014-07-24 — End: 2014-07-30

## 2014-07-24 MED ORDER — PALBOCICLIB 125 MG PO CAPS: 125.0000 mg | ORAL_CAPSULE | Freq: Every day | ORAL | Status: DC

## 2014-07-27 ENCOUNTER — Inpatient Hospital Stay: Payer: Medicare Other

## 2014-07-27 ENCOUNTER — Inpatient Hospital Stay (HOSPITAL_BASED_OUTPATIENT_CLINIC_OR_DEPARTMENT_OTHER): Payer: Medicare Other | Admitting: Hematology and Oncology

## 2014-07-27 ENCOUNTER — Other Ambulatory Visit: Payer: Self-pay

## 2014-07-27 ENCOUNTER — Telehealth: Payer: Self-pay

## 2014-07-27 VITALS — BP 163/81 | HR 69 | Temp 98.2°F | Resp 18 | Ht 64.0 in | Wt 120.5 lb

## 2014-07-27 DIAGNOSIS — Z170 Estrogen receptor positive status [ER+]: Secondary | ICD-10-CM | POA: Diagnosis not present

## 2014-07-27 DIAGNOSIS — C50911 Malignant neoplasm of unspecified site of right female breast: Secondary | ICD-10-CM

## 2014-07-27 DIAGNOSIS — C50919 Malignant neoplasm of unspecified site of unspecified female breast: Secondary | ICD-10-CM

## 2014-07-27 DIAGNOSIS — Z79899 Other long term (current) drug therapy: Secondary | ICD-10-CM

## 2014-07-27 DIAGNOSIS — Z79818 Long term (current) use of other agents affecting estrogen receptors and estrogen levels: Secondary | ICD-10-CM

## 2014-07-27 DIAGNOSIS — C7951 Secondary malignant neoplasm of bone: Secondary | ICD-10-CM | POA: Diagnosis not present

## 2014-07-27 LAB — CBC WITH DIFFERENTIAL/PLATELET
Basophils Absolute: 0 10*3/uL (ref 0–0.1)
Basophils Relative: 1 %
Eosinophils Absolute: 0 10*3/uL (ref 0–0.7)
Eosinophils Relative: 1 %
HCT: 34.3 % — ABNORMAL LOW (ref 35.0–47.0)
Hemoglobin: 11.4 g/dL — ABNORMAL LOW (ref 12.0–16.0)
Lymphocytes Relative: 22 %
Lymphs Abs: 0.6 10*3/uL — ABNORMAL LOW (ref 1.0–3.6)
MCH: 34.3 pg — ABNORMAL HIGH (ref 26.0–34.0)
MCHC: 33.2 g/dL (ref 32.0–36.0)
MCV: 103.2 fL — ABNORMAL HIGH (ref 80.0–100.0)
Monocytes Absolute: 0.3 10*3/uL (ref 0.2–0.9)
Monocytes Relative: 13 %
Neutro Abs: 1.8 10*3/uL (ref 1.4–6.5)
Neutrophils Relative %: 63 %
Platelets: 128 10*3/uL — ABNORMAL LOW (ref 150–440)
RBC: 3.33 MIL/uL — ABNORMAL LOW (ref 3.80–5.20)
RDW: 14.4 % (ref 11.5–14.5)
WBC: 2.8 10*3/uL — ABNORMAL LOW (ref 3.6–11.0)

## 2014-07-27 LAB — COMPREHENSIVE METABOLIC PANEL
ALT: 8 U/L — ABNORMAL LOW (ref 14–54)
AST: 20 U/L (ref 15–41)
Albumin: 4.3 g/dL (ref 3.5–5.0)
Alkaline Phosphatase: 39 U/L (ref 38–126)
Anion gap: 6 (ref 5–15)
BUN: 20 mg/dL (ref 6–20)
CO2: 27 mmol/L (ref 22–32)
Calcium: 8.4 mg/dL — ABNORMAL LOW (ref 8.9–10.3)
Chloride: 101 mmol/L (ref 101–111)
Creatinine, Ser: 1.02 mg/dL — ABNORMAL HIGH (ref 0.44–1.00)
GFR calc Af Amer: >60 mL/min (ref >60)
GFR calc non Af Amer: 56 mL/min — ABNORMAL LOW (ref >60)
Glucose, Bld: 118 mg/dL — ABNORMAL HIGH (ref 65–99)
Potassium: 3.7 mmol/L (ref 3.5–5.1)
Sodium: 134 mmol/L — ABNORMAL LOW (ref 135–145)
Total Bilirubin: 0.4 mg/dL (ref 0.3–1.2)
Total Protein: 7.3 g/dL (ref 6.5–8.1)

## 2014-07-27 MED ORDER — DENOSUMAB 120 MG/1.7ML ~~LOC~~ SOLN
120.0000 mg | Freq: Once | SUBCUTANEOUS | Status: AC
  Administered 2014-07-27: 120 mg via SUBCUTANEOUS
  Filled 2014-07-27: qty 1.7

## 2014-07-27 MED ORDER — HEPARIN SOD (PORK) LOCK FLUSH 100 UNIT/ML IV SOLN
500.0000 [IU] | Freq: Once | INTRAVENOUS | Status: AC
  Administered 2014-07-27: 500 [IU] via INTRAVENOUS
  Filled 2014-07-27: qty 5

## 2014-07-27 MED ORDER — FULVESTRANT 250 MG/5ML IM SOLN
500.0000 mg | INTRAMUSCULAR | Status: DC
  Administered 2014-07-27: 500 mg via INTRAMUSCULAR
  Filled 2014-07-27: qty 10

## 2014-07-27 MED ORDER — SODIUM CHLORIDE 0.9 % IJ SOLN
10.0000 mL | INTRAMUSCULAR | Status: DC | PRN
  Filled 2014-07-27: qty 10

## 2014-07-27 NOTE — Telephone Encounter (Signed)
Spoke with pt this afternoon about needing to increase calcium per MD to 1000 mg to 1200 mg a day.  Asked to pt to take current calcium that pt is taking to pharmacist after long conversation for pharmacist to show pt how to add the additional mg needed per day.  Pt verbalized that was a good idea because she wasn't for sure she understood the additional need.  Will follow up in the morning with pt.

## 2014-07-27 NOTE — Telephone Encounter (Signed)
rx for Ibrance faxed to [facility] at [phone]

## 2014-07-27 NOTE — Progress Notes (Signed)
[facility] day:  07/27/2014  Chief Complaint: Elizabeth Bond is a 64 y.o. female with metastatic Her2/neu positive breast cancer who is seen for 1 month assessment.  HPI:  The patient was last seen in the medical oncology clinic on 06/25/2014.  At that time, she felt great.  She had chronic lymphedema in her right arm.  We discussed follow-up PET scan for a new baseline and rising markers.  CA27.29 was 916.3 on 03/19/2013, 286.2 on 06/23/2013, 70.8 on 08/25/2013, 56.3 on 10/08/2013, 102.7 on 12/15/2013, 149.4 on 01/07/2014, 178.0 on 02/04/2014, 190.7 on 03/30/2014, 143.2 on 04/22/2014, and 134.8 on 05/20/2014.  Ibrance started on 02/23/2014.  During the interim, she voices no complaint except for a little cold.  She denies any pain.  Her Leslee Home has not come in.    Past Medical History  Diagnosis Date  . IBS (irritable bowel syndrome)   . Cancer 2015    breast and bone  . Breast cancer     right    Past Surgical History  Procedure Laterality Date  . Replacement total knee Left 2004-2005?  Marland Kitchen Abdominal hysterectomy  1978  . Portacath placement  2015    Family History  Problem Relation Age of Onset  . Cancer Father     prostate  . Cancer Maternal Aunt     breast  . Cancer Cousin     breast  . Cancer Other     lung cancer - neice  . Cancer - Other Daughter     brain    Social History:  reports that she quit smoking about 15 months ago. Her smoking use included Cigarettes. She has a 10 pack-year smoking history. She has never used smokeless tobacco. She reports that she drinks alcohol. She reports that she does not use illicit drugs.  The patient is alone today.  Allergies:  Allergies  Allergen Reactions  . Fentanyl Other (See Comments)    "loopy and confused"    Current Medications: Current Outpatient Prescriptions  Medication Sig Dispense Refill  . acetaminophen (TYLENOL) 500 MG tablet Take 1,000 mg by mouth every 8 (eight)  hours as needed for headache.    . Calcium 600-200 MG-UNIT per tablet Take 1 tablet by mouth 2 (two) times daily. 60 tablet 3  . Cholecalciferol (VITAMIN D3) 5000 UNITS CAPS Take 1 capsule (5,000 Units total) by mouth daily. 30 capsule 3  . clonazePAM (KLONOPIN) 0.5 MG tablet Take 1 tablet (0.5 mg total) by mouth 3 (three) times daily as needed for anxiety. 90 tablet 2  . furosemide (LASIX) 20 MG tablet 1 tab by mouth every other day as needed for edema 30 tablet 2  . oxycodone (OXY-IR) 5 MG capsule Take 1 -2 tablets every 4 hours by mouth as needed 60 capsule 0  . OxyCODONE (OXYCONTIN) 20 mg T12A 12 hr tablet Take 1 tablet (20 mg total) by mouth every 12 (twelve) hours. 60 tablet 0  . palbociclib (IBRANCE) 125 MG capsule Take 1 capsule (125 mg total) by mouth daily with breakfast. for 3 weeks on then 1 week off; Take whole with food. 21 capsule 2  . pantoprazole (PROTONIX) 40 MG tablet Take 1 tablet (40 mg total) by mouth daily. 30 tablet 2  . phosphorus (PHOSPHA 250 NEUTRAL) 155-852-130 MG tablet Take 1 tablet (250 mg total) by mouth 2 (two) times daily. 60 tablet 2  . potassium chloride (K-DUR) 10 MEQ tablet Take 1 tablet (10 mEq  total) by mouth daily. 30 tablet 2   No current facility-administered medications for this visit.   Facility-Administered Medications Ordered in Other Visits  Medication Dose Route Frequency Provider Last Rate Last Dose  . heparin lock flush 100 unit/mL  500 Units Intravenous Once Lequita Asal, MD      . sodium chloride 0.9 % injection 10 mL  10 mL Intravenous PRN Lequita Asal, MD        Review of Systems:  GENERAL:  No complaints.  Active.  No fevers, sweats or weight loss. PERFORMANCE STATUS (ECOG):  0 HEENT:  No visual changes, runny nose, sore throat, mouth sores or tenderness. Lungs: No shortness of breath or cough.  No hemoptysis. Cardiac:  No chest pain, palpitations, orthopnea, or PND. GI:  No nausea, vomiting, diarrhea, constipation, melena  or hematochezia.  Unaware of last colonoscopy. GU:  No urgency, frequency, dysuria, or hematuria. Musculoskeletal:  No back pain.  No joint pain.  No muscle tenderness. Extremities:  No pain or swelling. Skin:  No rashes or skin changes. Neuro:  No headache, numbness or weakness, balance or coordination issues. Endocrine:  No diabetes, thyroid issues, hot flashes or night sweats. Psych:  No mood changes, depression or anxiety. Pain:  No focal pain. Review of systems:  All other systems reviewed and found to be negative.   Physical Exam: Blood pressure 163/81, pulse 69, temperature 98.2 F (36.8 C), temperature source Oral, resp. rate 18, height _0  (1.626 m), weight 120 lb 7.7 oz (54.65 kg). GENERAL:  Well developed, well nourished, sitting comfortably in the exam room in no acute distress. MENTAL STATUS:  Alert and oriented to person, place and time. HEAD:  Short styled gray hair.  Normocephalic, atraumatic, face symmetric, no Cushingoid features. EYES: Hazel eyes.  Pupils equal round and reactive to light and accomodation.  No conjunctivitis or scleral icterus. ENT:  Oropharynx clear without lesion.  Tongue normal. Mucous membranes moist.  RESPIRATORY:  Clear to auscultation without rales, wheezes or rhonchi. CARDIOVASCULAR:  Regular rate and rhythm without murmur, rub or gallop. BREAST:  Right breast with well healed incision fixed to chest wall.  No discrete masses, skin changes or nipple discharge.  Left breast without masses, skin changes or nipple discharge. ABDOMEN:  Soft, non-tender, with active bowel sounds, and no hepatosplenomegaly.  No masses. BACK:  No tenderness on percussion of the back or rib cage. SKIN:  No rashes, ulcers or lesions. EXTREMITIES: No edema, no skin discoloration or tenderness.  No palpable cords. LYMPH NODES: No palpable cervical, supraclavicular, axillary or inguinal adenopathy  NEUROLOGICAL: Unremarkable. PSYCH:  Appropriate.   Infusion on  07/27/2014  Component Date Value Ref Range Status  . WBC 07/27/2014 2.8* 3.6 - 11.0 K/uL Final   A-LINE DRAW  . RBC 07/27/2014 3.33* 3.80 - 5.20 MIL/uL Final  . Hemoglobin 07/27/2014 11.4* 12.0 - 16.0 g/dL Final  . HCT 07/27/2014 34.3* 35.0 - 47.0 % Final  . MCV 07/27/2014 103.2* 80.0 - 100.0 fL Final  . MCH 07/27/2014 34.3* 26.0 - 34.0 pg Final  . MCHC 07/27/2014 33.2  32.0 - 36.0 g/dL Final  . RDW 07/27/2014 14.4  11.5 - 14.5 % Final  . Platelets 07/27/2014 128* 150 - 440 K/uL Final  . Neutrophils Relative % 07/27/2014 63   Final  . Neutro Abs 07/27/2014 1.8  1.4 - 6.5 K/uL Final  . Lymphocytes Relative 07/27/2014 22   Final  . Lymphs Abs 07/27/2014 0.6* 1.0 - 3.6 K/uL Final  .  Monocytes Relative 07/27/2014 13   Final  . Monocytes Absolute 07/27/2014 0.3  0.2 - 0.9 K/uL Final  . Eosinophils Relative 07/27/2014 1   Final  . Eosinophils Absolute 07/27/2014 0.0  0 - 0.7 K/uL Final  . Basophils Relative 07/27/2014 1   Final  . Basophils Absolute 07/27/2014 0.0  0 - 0.1 K/uL Final  . Sodium 07/27/2014 134* 135 - 145 mmol/L Final  . Potassium 07/27/2014 3.7  3.5 - 5.1 mmol/L Final  . Chloride 07/27/2014 101  101 - 111 mmol/L Final  . CO2 07/27/2014 27  22 - 32 mmol/L Final  . Glucose, Bld 07/27/2014 118* 65 - 99 mg/dL Final  . BUN 07/27/2014 20  6 - 20 mg/dL Final  . Creatinine, Ser 07/27/2014 1.02* 0.44 - 1.00 mg/dL Final  . Calcium 07/27/2014 8.4* 8.9 - 10.3 mg/dL Final  . Total Protein 07/27/2014 7.3  6.5 - 8.1 g/dL Final  . Albumin 07/27/2014 4.3  3.5 - 5.0 g/dL Final  . AST 07/27/2014 20  15 - 41 U/L Final  . ALT 07/27/2014 8* 14 - 54 U/L Final  . Alkaline Phosphatase 07/27/2014 39  38 - 126 U/L Final  . Total Bilirubin 07/27/2014 0.4  0.3 - 1.2 mg/dL Final  . GFR calc non Af Amer 07/27/2014 56* >60 mL/min Final  . GFR calc Af Amer 07/27/2014 >60  >60 mL/min Final   Comment: (NOTE) The eGFR has been calculated using the CKD EPI equation. This calculation has not been  validated in all clinical situations. eGFR's persistently <60 mL/min signify possible Chronic Kidney Disease.   Georgiann Hahn gap 07/27/2014 6  5 - 15 Final    Assessment:  Elizabeth Bond is a 64 y.o. female with metastatic Her2/neu positive right breast cancer.  She initially presented in 03/2013 with an ulcerated breast mass and back pain.  Breast biopsy revealed lobular breast cancer.  Tumor was ER/PR positive and HER-2/neu positive. PET scan revealed bone metastasis. She received palliative radiation to T8 and T10 from 04/02-04/15/2015.  She declined chemotherapy. She initially began letrozole and Tykerb. Tykerb was discontinued after 1 dose. She received letrozole from 05/02/2013 until 01/07/2014 .  She received Herceptin from 06/02/2013 until 12/15/2013. She has received fulvestrant monthly from 01/07/2014 until 05/27/2014.  She has received Xgeva monthly from 03/31/2013 until 06/25/2014.  Notes indicate Herceptin was discontinued for a rising CA-27-29.  CA27.29 was 916.3 on 03/19/2013, 286.2 on 06/23/2013, 70.8 on 08/25/2013, 56.3 on 10/08/2013, 102.7 on 12/15/2013, 149.4 on 01/07/2014, 178.0 on 02/04/2014, 190.7 on 03/30/2014, 143.2 on 04/22/2014, and 134.8 on 05/20/2014.   Leslee Home was started on 02/23/2014.  She is tolerating it well.  Symptomatically, she voices no complaint.  Exam is unremarkable.  Plan: 1.  Labs today:  CBC with diff, CMP. 2.  Faslodex and Xgeva today. 3.  Begin next cycle of Ibrance when available. 4.  Discuss reimaging studies (PET scan). 5.  RTC in 2 weeks (08/12/2014) for labs (CBC with diff) 6.  RTC in 4 weeks for MD assess, labs (CBC with diff, CMP, Mg, CA27.29), and Xgeva + Faslodex.   Lequita Asal, MD  07/27/2014, 11:43 AM

## 2014-07-28 ENCOUNTER — Telehealth: Payer: Self-pay

## 2014-07-28 NOTE — Telephone Encounter (Signed)
Followed up with pt regarding additional calcium needed.  Pt took current bottle of calcium to pharmacy and asked pharmacist to help her pick out what she needed to make it to the additional needed dosage.  Pharmacist found pt Natures Made 600 mg of calcium.  Pt verbalizes an undestanding that she needs to take one of her original ones with vit D and an additional one of the natures made to get the total of 1200 mg a day.

## 2014-07-28 NOTE — Telephone Encounter (Signed)
[facility] have sent rx for Ibrance to preferred pharmacy [facility].

## 2014-07-30 ENCOUNTER — Telehealth: Payer: Self-pay | Admitting: *Deleted

## 2014-07-30 MED ORDER — PALBOCICLIB 125 MG PO CAPS: 125.0000 mg | ORAL_CAPSULE | Freq: Every day | ORAL | Status: DC

## 2014-07-30 NOTE — Telephone Encounter (Signed)
  Thanks for fixing that!  M

## 2014-07-30 NOTE — Telephone Encounter (Signed)
Called to state that she has not received her Ibrance yet. I called [facility] who stated that they have not received a new rx from [place]. It was sent to the wrong pharmacy who forwarded it to the wrong pharmacy. I e scribed the rx per request of [facility] and [facility] notified

## 2014-07-30 NOTE — Telephone Encounter (Signed)
  Thanks for fixing that.  M

## 2014-08-07 ENCOUNTER — Other Ambulatory Visit: Payer: Self-pay

## 2014-08-07 DIAGNOSIS — C50911 Malignant neoplasm of unspecified site of right female breast: Secondary | ICD-10-CM

## 2014-08-10 ENCOUNTER — Inpatient Hospital Stay: Payer: Medicare Other | Attending: Hematology and Oncology

## 2014-08-10 DIAGNOSIS — C7951 Secondary malignant neoplasm of bone: Secondary | ICD-10-CM | POA: Diagnosis not present

## 2014-08-10 DIAGNOSIS — Z79899 Other long term (current) drug therapy: Secondary | ICD-10-CM | POA: Diagnosis not present

## 2014-08-10 DIAGNOSIS — Z79818 Long term (current) use of other agents affecting estrogen receptors and estrogen levels: Secondary | ICD-10-CM | POA: Diagnosis not present

## 2014-08-10 DIAGNOSIS — C50911 Malignant neoplasm of unspecified site of right female breast: Secondary | ICD-10-CM | POA: Insufficient documentation

## 2014-08-10 DIAGNOSIS — Z5111 Encounter for antineoplastic chemotherapy: Secondary | ICD-10-CM | POA: Insufficient documentation

## 2014-08-10 DIAGNOSIS — Z170 Estrogen receptor positive status [ER+]: Secondary | ICD-10-CM | POA: Insufficient documentation

## 2014-08-10 LAB — CBC WITH DIFFERENTIAL/PLATELET
Basophils Absolute: 0.1 10*3/uL (ref 0–0.1)
Basophils Relative: 1 %
Eosinophils Absolute: 0 10*3/uL (ref 0–0.7)
Eosinophils Relative: 1 %
HCT: 33.9 % — ABNORMAL LOW (ref 35.0–47.0)
Hemoglobin: 11.5 g/dL — ABNORMAL LOW (ref 12.0–16.0)
Lymphocytes Relative: 15 %
Lymphs Abs: 0.7 10*3/uL — ABNORMAL LOW (ref 1.0–3.6)
MCH: 34.2 pg — ABNORMAL HIGH (ref 26.0–34.0)
MCHC: 33.8 g/dL (ref 32.0–36.0)
MCV: 101.1 fL — ABNORMAL HIGH (ref 80.0–100.0)
Monocytes Absolute: 0.2 10*3/uL (ref 0.2–0.9)
Monocytes Relative: 4 %
Neutro Abs: 3.6 10*3/uL (ref 1.4–6.5)
Neutrophils Relative %: 79 %
Platelets: 222 10*3/uL (ref 150–440)
RBC: 3.35 MIL/uL — ABNORMAL LOW (ref 3.80–5.20)
RDW: 14.2 % (ref 11.5–14.5)
WBC: 4.5 10*3/uL (ref 3.6–11.0)

## 2014-08-20 ENCOUNTER — Other Ambulatory Visit: Payer: Self-pay | Admitting: *Deleted

## 2014-08-20 DIAGNOSIS — C50919 Malignant neoplasm of unspecified site of unspecified female breast: Secondary | ICD-10-CM

## 2014-08-20 MED ORDER — OXYCODONE HCL ER 20 MG PO T12A: 20.0000 mg | EXTENDED_RELEASE_TABLET | Freq: Two times a day (BID) | ORAL | Status: DC

## 2014-08-20 NOTE — Telephone Encounter (Signed)
Informed that prescription is ready to pick up  

## 2014-08-21 ENCOUNTER — Telehealth: Payer: Self-pay

## 2014-08-21 NOTE — Telephone Encounter (Signed)
Called and spoke with pt about daughters concerns for her mother complaining about a pain in her chest and not feeling well.  When speaking with pt she reported that she would notify us if she had any concerns and that she was thinking that the pain was related to not eating that when she ate it disappeared.  I explained to pt that chest pain can lead to a serious medical event and pt verbalizes that it was not that bad and if she does experience any pain she is not playing with her life and will call us with any concerns or go to the nearest ER.

## 2014-08-24 ENCOUNTER — Encounter: Payer: Self-pay | Admitting: Hematology and Oncology

## 2014-08-25 ENCOUNTER — Inpatient Hospital Stay: Payer: Medicare Other

## 2014-08-25 ENCOUNTER — Inpatient Hospital Stay (HOSPITAL_BASED_OUTPATIENT_CLINIC_OR_DEPARTMENT_OTHER): Payer: Medicare Other | Admitting: Hematology and Oncology

## 2014-08-25 VITALS — BP 150/78 | HR 88 | Temp 95.4°F | Resp 18 | Wt 123.9 lb

## 2014-08-25 DIAGNOSIS — C50911 Malignant neoplasm of unspecified site of right female breast: Secondary | ICD-10-CM | POA: Diagnosis not present

## 2014-08-25 DIAGNOSIS — C7951 Secondary malignant neoplasm of bone: Secondary | ICD-10-CM | POA: Diagnosis not present

## 2014-08-25 DIAGNOSIS — Z170 Estrogen receptor positive status [ER+]: Secondary | ICD-10-CM

## 2014-08-25 DIAGNOSIS — Z79899 Other long term (current) drug therapy: Secondary | ICD-10-CM | POA: Diagnosis not present

## 2014-08-25 DIAGNOSIS — C50919 Malignant neoplasm of unspecified site of unspecified female breast: Secondary | ICD-10-CM

## 2014-08-25 DIAGNOSIS — Z79818 Long term (current) use of other agents affecting estrogen receptors and estrogen levels: Secondary | ICD-10-CM

## 2014-08-25 DIAGNOSIS — Z5111 Encounter for antineoplastic chemotherapy: Secondary | ICD-10-CM | POA: Diagnosis not present

## 2014-08-25 LAB — COMPREHENSIVE METABOLIC PANEL
ALT: 10 U/L — ABNORMAL LOW (ref 14–54)
AST: 19 U/L (ref 15–41)
Albumin: 4.3 g/dL (ref 3.5–5.0)
Alkaline Phosphatase: 43 U/L (ref 38–126)
Anion gap: 4 — ABNORMAL LOW (ref 5–15)
BUN: 26 mg/dL — ABNORMAL HIGH (ref 6–20)
CO2: 31 mmol/L (ref 22–32)
Calcium: 8.9 mg/dL (ref 8.9–10.3)
Chloride: 103 mmol/L (ref 101–111)
Creatinine, Ser: 1.03 mg/dL — ABNORMAL HIGH (ref 0.44–1.00)
GFR calc Af Amer: >60 mL/min (ref >60)
GFR calc non Af Amer: 56 mL/min — ABNORMAL LOW (ref >60)
Glucose, Bld: 78 mg/dL (ref 65–99)
Potassium: 4.7 mmol/L (ref 3.5–5.1)
Sodium: 138 mmol/L (ref 135–145)
Total Bilirubin: 0.5 mg/dL (ref 0.3–1.2)
Total Protein: 7.5 g/dL (ref 6.5–8.1)

## 2014-08-25 LAB — CBC WITH DIFFERENTIAL/PLATELET
Basophils Absolute: 0 10*3/uL (ref 0–0.1)
Basophils Relative: 1 %
Eosinophils Absolute: 0 10*3/uL (ref 0–0.7)
Eosinophils Relative: 0 %
HCT: 34.4 % — ABNORMAL LOW (ref 35.0–47.0)
Hemoglobin: 11.7 g/dL — ABNORMAL LOW (ref 12.0–16.0)
Lymphocytes Relative: 17 %
Lymphs Abs: 0.5 10*3/uL — ABNORMAL LOW (ref 1.0–3.6)
MCH: 34.4 pg — ABNORMAL HIGH (ref 26.0–34.0)
MCHC: 34.1 g/dL (ref 32.0–36.0)
MCV: 101 fL — ABNORMAL HIGH (ref 80.0–100.0)
Monocytes Absolute: 0.2 10*3/uL (ref 0.2–0.9)
Monocytes Relative: 6 %
Neutro Abs: 2.2 10*3/uL (ref 1.4–6.5)
Neutrophils Relative %: 76 %
Platelets: 139 10*3/uL — ABNORMAL LOW (ref 150–440)
RBC: 3.41 MIL/uL — ABNORMAL LOW (ref 3.80–5.20)
RDW: 14.7 % — ABNORMAL HIGH (ref 11.5–14.5)
WBC: 2.9 10*3/uL — ABNORMAL LOW (ref 3.6–11.0)

## 2014-08-25 MED ORDER — SODIUM CHLORIDE 0.9 % IV SOLN: Freq: Once | INTRAVENOUS | Status: DC | PRN

## 2014-08-25 MED ORDER — EPINEPHRINE HCL 0.1 MG/ML IJ SOLN: 0.2500 mg | Freq: Once | INTRAMUSCULAR | Status: DC | PRN

## 2014-08-25 MED ORDER — METHYLPREDNISOLONE SODIUM SUCC 125 MG IJ SOLR: 125.0000 mg | Freq: Once | INTRAMUSCULAR | Status: DC | PRN

## 2014-08-25 MED ORDER — SODIUM CHLORIDE 0.9 % IJ SOLN
10.0000 mL | INTRAMUSCULAR | Status: DC | PRN
  Administered 2014-08-25: 10 mL via INTRAVENOUS
  Filled 2014-08-25: qty 10

## 2014-08-25 MED ORDER — DENOSUMAB 120 MG/1.7ML ~~LOC~~ SOLN
120.0000 mg | Freq: Once | SUBCUTANEOUS | Status: AC
Start: 2014-08-25 — End: 2014-08-25
  Administered 2014-08-25: 120 mg via SUBCUTANEOUS
  Filled 2014-08-25: qty 1.7

## 2014-08-25 MED ORDER — DIPHENHYDRAMINE HCL 50 MG/ML IJ SOLN: 25.0000 mg | Freq: Once | INTRAMUSCULAR | Status: DC | PRN

## 2014-08-25 MED ORDER — HEPARIN SOD (PORK) LOCK FLUSH 100 UNIT/ML IV SOLN
500.0000 [IU] | Freq: Once | INTRAVENOUS | Status: AC
  Administered 2014-08-25: 500 [IU] via INTRAVENOUS
  Filled 2014-08-25: qty 5

## 2014-08-25 MED ORDER — ALBUTEROL SULFATE (2.5 MG/3ML) 0.083% IN NEBU: 2.5000 mg | INHALATION_SOLUTION | Freq: Once | RESPIRATORY_TRACT | Status: DC | PRN

## 2014-08-25 MED ORDER — FULVESTRANT 250 MG/5ML IM SOLN
500.0000 mg | INTRAMUSCULAR | Status: DC
  Administered 2014-08-25: 500 mg via INTRAMUSCULAR
  Filled 2014-08-25: qty 10

## 2014-08-25 MED ORDER — DIPHENHYDRAMINE HCL 50 MG/ML IJ SOLN: 50.0000 mg | Freq: Once | INTRAMUSCULAR | Status: DC | PRN

## 2014-08-25 NOTE — Progress Notes (Signed)
[facility] day:  08/25/2014  Chief Complaint: Elizabeth Bond is a 64 y.o. female with metastatic Her2/neu positive breast cancer who is seen for 1 month assessment.  HPI:  The patient was last seen in the medical oncology clinic on 07/27/2014.  At that time, she voiced no complaint except for a little cold.  She denied any pain.  She was awaiting her Ibrance shipment.  She received monthly Faslodex and Xgeva.  During the interm, she has noted a little chest pain when putting down carpet. She describes pain as "little and like indigestion".  She denies any associated shortness of breath or radiation to her neck, jaw or arm. She denies any bone pain. She states that her Leslee Home finished on Saturday (08/22/2014).  Past Medical History  Diagnosis Date  . IBS (irritable bowel syndrome)   . Cancer 2015    breast and bone  . Breast cancer     right    Past Surgical History  Procedure Laterality Date  . Replacement total knee Left 2004-2005?  Marland Kitchen Abdominal hysterectomy  1978  . Portacath placement  2015    Family History  Problem Relation Age of Onset  . Cancer Father     prostate  . Cancer Maternal Aunt     breast  . Cancer Cousin     breast  . Cancer Other     lung cancer - neice  . Cancer - Other Daughter     brain    Social History:  reports that she quit smoking about 16 months ago. Her smoking use included Cigarettes. She has a 10 pack-year smoking history. She has never used smokeless tobacco. She reports that she drinks alcohol. She reports that she does not use illicit drugs.  The patient is alone today.  Allergies:  Allergies  Allergen Reactions  . Fentanyl Other (See Comments)    "loopy and confused"    Current Medications: Current Outpatient Prescriptions  Medication Sig Dispense Refill  . acetaminophen (TYLENOL) 500 MG tablet Take 1,000 mg by mouth every 8 (eight) hours as needed for headache.    . Calcium 600-200  MG-UNIT per tablet Take 1 tablet by mouth 2 (two) times daily. 60 tablet 3  . Cholecalciferol (VITAMIN D3) 5000 UNITS CAPS Take 1 capsule (5,000 Units total) by mouth daily. 30 capsule 3  . clonazePAM (KLONOPIN) 0.5 MG tablet Take 1 tablet (0.5 mg total) by mouth 3 (three) times daily as needed for anxiety. 90 tablet 2  . furosemide (LASIX) 20 MG tablet 1 tab by mouth every other day as needed for edema 30 tablet 2  . oxycodone (OXY-IR) 5 MG capsule Take 1 -2 tablets every 4 hours by mouth as needed 60 capsule 0  . OxyCODONE (OXYCONTIN) 20 mg T12A 12 hr tablet Take 1 tablet (20 mg total) by mouth every 12 (twelve) hours. 60 tablet 0  . palbociclib (IBRANCE) 125 MG capsule Take 1 capsule (125 mg total) by mouth daily with breakfast. for 3 weeks on then 1 week off; Take whole with food. 21 capsule 2  . pantoprazole (PROTONIX) 40 MG tablet Take 1 tablet (40 mg total) by mouth daily. 30 tablet 2  . phosphorus (PHOSPHA 250 NEUTRAL) 155-852-130 MG tablet Take 1 tablet (250 mg total) by mouth 2 (two) times daily. 60 tablet 2  . potassium chloride (K-DUR) 10 MEQ tablet Take 1 tablet (10 mEq total) by mouth daily. 30 tablet 2   No  current facility-administered medications for this visit.   Facility-Administered Medications Ordered in Other Visits  Medication Dose Route Frequency Provider Last Rate Last Dose  . sodium chloride 0.9 % injection 10 mL  10 mL Intravenous PRN Melissa C Corcoran, MD   10 mL at 08/25/14 0918    Review of Systems:  GENERAL:  Feels "ok".  No fevers, sweats or weight loss. PERFORMANCE STATUS (ECOG):  0 HEENT:  No visual changes, runny nose, sore throat, mouth sores or tenderness. Lungs: No shortness of breath or cough.  No hemoptysis. Cardiac:  Interval chest pain (see HPI).  No palpitations, orthopnea, or PND. GI:  No nausea, vomiting, diarrhea, constipation, melena or hematochezia.  Unaware of last colonoscopy. GU:  No urgency, frequency, dysuria, or  hematuria. Musculoskeletal:  No back pain.  No joint pain.  No muscle tenderness. Extremities:  No pain or swelling. Skin:  No rashes or skin changes. Neuro:  No headache, numbness or weakness, balance or coordination issues. Endocrine:  No diabetes, thyroid issues, hot flashes or night sweats. Psych:  No mood changes, depression or anxiety. Pain:  No focal pain. Review of systems:  All other systems reviewed and found to be negative.   Physical Exam: Blood pressure 150/78, pulse 88, temperature 95.4 F (35.2 C), temperature source Tympanic, resp. rate 18, weight 123 lb 14.4 oz (56.2 kg). GENERAL:  Well developed, well nourished, sitting comfortably in the exam room in no acute distress. MENTAL STATUS:  Alert and oriented to person, place and time. HEAD:  Short styled gray hair.  Normocephalic, atraumatic, face symmetric, no Cushingoid features. EYES: Hazel eyes.  Pupils equal round and reactive to light and accomodation.  No conjunctivitis or scleral icterus. ENT:  Oropharynx clear without lesion.  Tongue normal. Mucous membranes moist.  RESPIRATORY:  Clear to auscultation without rales, wheezes or rhonchi. CARDIOVASCULAR:  Regular rate and rhythm without murmur, rub or gallop. ABDOMEN:  Soft, non-tender, with active bowel sounds, and no hepatosplenomegaly.  No masses. BACK:  No tenderness on percussion of the back or rib cage. SKIN:  No rashes, ulcers or lesions. EXTREMITIES: No edema, no skin discoloration or tenderness.  No palpable cords. LYMPH NODES: No palpable cervical, supraclavicular, axillary or inguinal adenopathy  NEUROLOGICAL: Unremarkable. PSYCH:  Appropriate.   Infusion on 08/25/2014  Component Date Value Ref Range Status  . WBC 08/25/2014 2.9* 3.6 - 11.0 K/uL Final   A-LINE DRAW  . RBC 08/25/2014 3.41* 3.80 - 5.20 MIL/uL Final  . Hemoglobin 08/25/2014 11.7* 12.0 - 16.0 g/dL Final  . HCT 08/25/2014 34.4* 35.0 - 47.0 % Final  . MCV 08/25/2014 101.0* 80.0 - 100.0 fL  Final  . MCH 08/25/2014 34.4* 26.0 - 34.0 pg Final  . MCHC 08/25/2014 34.1  32.0 - 36.0 g/dL Final  . RDW 08/25/2014 14.7* 11.5 - 14.5 % Final  . Platelets 08/25/2014 139* 150 - 440 K/uL Final  . Neutrophils Relative % 08/25/2014 76   Final  . Neutro Abs 08/25/2014 2.2  1.4 - 6.5 K/uL Final  . Lymphocytes Relative 08/25/2014 17   Final  . Lymphs Abs 08/25/2014 0.5* 1.0 - 3.6 K/uL Final  . Monocytes Relative 08/25/2014 6   Final  . Monocytes Absolute 08/25/2014 0.2  0.2 - 0.9 K/uL Final  . Eosinophils Relative 08/25/2014 0   Final  . Eosinophils Absolute 08/25/2014 0.0  0 - 0.7 K/uL Final  . Basophils Relative 08/25/2014 1   Final  . Basophils Absolute 08/25/2014 0.0  0 - 0.1 K/uL   Final    Assessment:  CAMIRA GEIDEL is a 64 y.o. female with metastatic Her2/neu positive right breast cancer.  She initially presented in 03/2013 with an ulcerated breast mass and back pain.  Breast biopsy revealed lobular breast cancer.  Tumor was ER/PR positive and HER-2/neu positive. PET scan revealed bone metastasis. She received palliative radiation to T8 and T10 from 04/02-04/15/2015.  She declined chemotherapy. She initially began letrozole and Tykerb. Tykerb was discontinued after 1 dose. She received letrozole from 05/02/2013 until 01/07/2014 .  She received Herceptin from 06/02/2013 until 12/15/2013. She has received fulvestrant monthly from 01/07/2014 until 05/27/2014.  She has received Xgeva monthly (03/31/2013 until 07/27/2014).  Notes indicate Herceptin was discontinued for a rising CA-27-29.  CA27.29 was 916.3 on 03/19/2013, 286.2 on 06/23/2013, 70.8 on 08/25/2013, 56.3 on 10/08/2013, 102.7 on 12/15/2013, 149.4 on 01/07/2014, 178.0 on 02/04/2014, 190.7 on 03/30/2014, 143.2 on 04/22/2014, 134.8 on 05/20/2014, and 248.3 on 07/09/2014.   Leslee Home was started on 02/23/2014.  She is tolerating it well.  Symptomatically, she describes mild chest pain when putting down carpet.  She denies any bone pain.  Exam  is unremarkable.  CA27.29 is increasing.  Plan: 1.  Labs today:  CBC with diff, CMP, CA27.29. 2.  Faslodex and Xgeva today. 3.  Postpone initiation of next cycle of Ibrance. 4.  Discuss follow-up PET scan secondary to rise in CA27.29- patient agreeable. 5.  RTC  After PET scan.    Lequita Asal, MD  08/25/2014, 10:29 AM

## 2014-08-26 ENCOUNTER — Telehealth: Payer: Self-pay | Admitting: *Deleted

## 2014-08-26 LAB — CANCER ANTIGEN 27.29: CA 27.29: 723.2 U/mL — ABNORMAL HIGH (ref 0.0–38.6)

## 2014-08-26 NOTE — Telephone Encounter (Signed)
Called asking for results of tumor marker from yesterday, her CA 27-29 has increased from 428.3 to 723.2 in the past month. She is scheduled for PET Friday.

## 2014-08-27 ENCOUNTER — Telehealth: Payer: Self-pay | Admitting: Hematology and Oncology

## 2014-08-27 NOTE — Telephone Encounter (Signed)
Re:  CA27.29  Discussed recent increase in CA27.29 with patient.  She had previously been set up for PET scan on 08/28/2014.  She just received her next shipment of Ibrance and opened it.  I discussed not taking her Ibrance.  We will discuss the results from her PET scan when available.  Lequita Asal, MD

## 2014-08-28 ENCOUNTER — Ambulatory Visit
Admission: RE | Admit: 2014-08-28 | Discharge: 2014-08-28 | Disposition: A | Discharge status: Home / Self Care | Payer: Medicare Other | Source: Ambulatory Visit | Attending: Hematology and Oncology | Admitting: Hematology and Oncology

## 2014-08-28 ENCOUNTER — Telehealth: Payer: Self-pay | Admitting: *Deleted

## 2014-08-28 DIAGNOSIS — C50919 Malignant neoplasm of unspecified site of unspecified female breast: Secondary | ICD-10-CM | POA: Insufficient documentation

## 2014-08-28 DIAGNOSIS — Z79899 Other long term (current) drug therapy: Secondary | ICD-10-CM | POA: Diagnosis not present

## 2014-08-28 DIAGNOSIS — C7951 Secondary malignant neoplasm of bone: Secondary | ICD-10-CM | POA: Diagnosis not present

## 2014-08-28 LAB — GLUCOSE, CAPILLARY: Glucose-Capillary: 68 mg/dL (ref 65–99)

## 2014-08-28 MED ORDER — FLUDEOXYGLUCOSE F - 18 (FDG) INJECTION
13.1400 | Freq: Once | INTRAVENOUS | Status: DC | PRN
  Administered 2014-08-28: 13.14 via INTRAVENOUS
  Filled 2014-08-28: qty 13.14

## 2014-08-28 NOTE — Telephone Encounter (Signed)
Left message for Elizabeth Bond that Elizabeth Bond is NOT to take her Leslee Home and that the doctor had discussed this with patient this week

## 2014-08-28 NOTE — Telephone Encounter (Signed)
Asking if Elizabeth Bond is to start her next round of chemo pills on Saturday since her markers have gone up

## 2014-08-31 ENCOUNTER — Other Ambulatory Visit: Payer: Self-pay

## 2014-09-01 ENCOUNTER — Inpatient Hospital Stay (HOSPITAL_BASED_OUTPATIENT_CLINIC_OR_DEPARTMENT_OTHER): Payer: Medicare Other | Admitting: Hematology and Oncology

## 2014-09-01 ENCOUNTER — Encounter: Payer: Self-pay | Admitting: Hematology and Oncology

## 2014-09-01 ENCOUNTER — Telehealth: Payer: Self-pay | Admitting: *Deleted

## 2014-09-01 VITALS — BP 170/92 | HR 77 | Temp 96.9°F | Resp 16 | Wt 125.9 lb

## 2014-09-01 DIAGNOSIS — Z79818 Long term (current) use of other agents affecting estrogen receptors and estrogen levels: Secondary | ICD-10-CM | POA: Diagnosis not present

## 2014-09-01 DIAGNOSIS — C50911 Malignant neoplasm of unspecified site of right female breast: Secondary | ICD-10-CM

## 2014-09-01 DIAGNOSIS — Z170 Estrogen receptor positive status [ER+]: Secondary | ICD-10-CM

## 2014-09-01 DIAGNOSIS — C7951 Secondary malignant neoplasm of bone: Secondary | ICD-10-CM | POA: Diagnosis not present

## 2014-09-01 DIAGNOSIS — Z5111 Encounter for antineoplastic chemotherapy: Secondary | ICD-10-CM | POA: Diagnosis not present

## 2014-09-01 DIAGNOSIS — Z79899 Other long term (current) drug therapy: Secondary | ICD-10-CM | POA: Diagnosis not present

## 2014-09-01 DIAGNOSIS — C50919 Malignant neoplasm of unspecified site of unspecified female breast: Secondary | ICD-10-CM

## 2014-09-01 HISTORY — DX: Secondary malignant neoplasm of bone: C79.51

## 2014-09-01 NOTE — Progress Notes (Signed)
[facility] day:  09/01/2014  Chief Complaint: Elizabeth Bond is a 64 y.o. female with metastatic Her2/neu positive breast cancer who is seen for review of interval PET scan and discussion regarding direction of therapy.  HPI:  The patient was last seen in the medical oncology clinic on 08/25/2014.  At that time, she  described mild chest pain when putting down carpet.  Exam was unremarkable.  CA27.29 was increasing.  We discussed postponing her next cycle of Ibrance and performing a PET scan.  She received monthly Faslodex and Xgeva.  PET scan on 08/28/2014 revealed significant interval worsening and multifocal osseous metastatic disease. Lesions had increased in number and metabolic activity. There were no extra osseous metastasis. The primary lesion in the right breast showed further improvement.  Past Medical History  Diagnosis Date  . IBS (irritable bowel syndrome)   . Cancer 2015    breast and bone  . Breast cancer     right    Past Surgical History  Procedure Laterality Date  . Replacement total knee Left 2004-2005?  Marland Kitchen Abdominal hysterectomy  1978  . Portacath placement  2015    Family History  Problem Relation Age of Onset  . Cancer Father     prostate  . Cancer Maternal Aunt     breast  . Cancer Cousin     breast  . Cancer Other     lung cancer - neice  . Cancer - Other Daughter     brain    Social History:  reports that she quit smoking about 17 months ago. Her smoking use included Cigarettes. She has a 10 pack-year smoking history. She has never used smokeless tobacco. She reports that she drinks alcohol. She reports that she does not use illicit drugs.  The patient is accompanied by her daughter today.  Allergies:  Allergies  Allergen Reactions  . Fentanyl Other (See Comments)    "loopy and confused"    Current Medications: Current Outpatient Prescriptions  Medication Sig Dispense Refill  . acetaminophen  (TYLENOL) 500 MG tablet Take 1,000 mg by mouth every 8 (eight) hours as needed for headache.    . calcium carbonate (OS-CAL) 600 MG TABS tablet Take 600 mg by mouth 2 (two) times daily with a meal.    . Cholecalciferol (VITAMIN D3) 5000 UNITS CAPS Take 1 capsule (5,000 Units total) by mouth daily. 30 capsule 3  . clonazePAM (KLONOPIN) 0.5 MG tablet Take 1 tablet (0.5 mg total) by mouth 3 (three) times daily as needed for anxiety. 90 tablet 2  . oxycodone (OXY-IR) 5 MG capsule Take 1 -2 tablets every 4 hours by mouth as needed 60 capsule 0  . OxyCODONE (OXYCONTIN) 20 mg T12A 12 hr tablet Take 1 tablet (20 mg total) by mouth every 12 (twelve) hours. 60 tablet 0  . pantoprazole (PROTONIX) 40 MG tablet Take 1 tablet (40 mg total) by mouth daily. 30 tablet 2  . phosphorus (PHOSPHA 250 NEUTRAL) 155-852-130 MG tablet Take 1 tablet (250 mg total) by mouth 2 (two) times daily. 60 tablet 2  . potassium chloride (K-DUR) 10 MEQ tablet Take 1 tablet (10 mEq total) by mouth daily. 30 tablet 2  . Calcium 600-200 MG-UNIT per tablet Take 1 tablet by mouth 2 (two) times daily. (Patient not taking: Reported on 09/01/2014) 60 tablet 3  . furosemide (LASIX) 20 MG tablet 1 tab by mouth every other day as needed for edema (Patient not taking:  Reported on 09/01/2014) 30 tablet 2  . palbociclib (IBRANCE) 125 MG capsule Take 1 capsule (125 mg total) by mouth daily with breakfast. for 3 weeks on then 1 week off; Take whole with food. (Patient not taking: Reported on 09/01/2014) 21 capsule 2   No current facility-administered medications for this visit.   Facility-Administered Medications Ordered in Other Visits  Medication Dose Route Frequency Provider Last Rate Last Dose  . fludeoxyglucose F - 18 (FDG) injection 13.14 milli Curie  13.14 milli Curie Intravenous Once PRN Medication Radiologist, MD   13.14 milli Curie at 08/28/14 1025    Review of Systems:  GENERAL:  Feels "ok".  No fevers, sweats or weight loss. PERFORMANCE  STATUS (ECOG):  0 HEENT:  No visual changes, runny nose, sore throat, mouth sores or tenderness. Lungs: No shortness of breath or cough.  No hemoptysis. Cardiac:  No chest pain, palpitations, orthopnea, or PND. GI:  No nausea, vomiting, diarrhea, constipation, melena or hematochezia.  Unaware of last colonoscopy. GU:  No urgency, frequency, dysuria, or hematuria. Musculoskeletal:  No back pain.  No joint pain.  No muscle tenderness. Extremities:  No pain or swelling. Skin:  No rashes or skin changes. Neuro:  No headache, numbness or weakness, balance or coordination issues. Endocrine:  No diabetes, thyroid issues, hot flashes or night sweats. Psych:  No mood changes, depression or anxiety. Pain:  No focal pain. Review of systems:  All other systems reviewed and found to be negative.   Physical Exam: Blood pressure 170/92, pulse 77, temperature 96.9 F (36.1 C), temperature source Tympanic, resp. rate 16, weight 125 lb 14.1 oz (57.1 kg). GENERAL:  Well developed, well nourished, sitting comfortably in the exam room in no acute distress. MENTAL STATUS:  Alert and oriented to person, place and time. HEAD:  Short styled gray hair.  Normocephalic, atraumatic, face symmetric, no Cushingoid features. EYES: Hazel eyes.  No conjunctivitis or scleral icterus. NEUROLOGICAL: Unremarkable. PSYCH:  Appropriate.   No visits with results within 3 Day(s) from this visit. Latest known visit with results is:  Hospital Outpatient Visit on 08/28/2014  Component Date Value Ref Range Status  . Glucose-Capillary 08/28/2014 68  65 - 99 mg/dL Final    Assessment:  Elizabeth Bond is a 64 y.o. female with metastatic Her2/neu positive right breast cancer.  She initially presented in 03/2013 with an ulcerated breast mass and back pain.  Breast biopsy revealed lobular breast cancer.  Tumor was ER/PR positive and HER-2/neu positive. PET scan revealed bone metastasis. She received palliative radiation to T8 and T10  from 04/02-04/15/2015.  She declined chemotherapy. She initially began letrozole and Tykerb. Tykerb was discontinued after 1 dose. She received letrozole from 05/02/2013 until 01/07/2014 .  She received Herceptin from 06/02/2013 until 12/15/2013. She has received fulvestrant monthly from 01/07/2014 until 05/27/2014.  She has received Xgeva monthly (03/31/2013 until 07/27/2014).  Notes indicate Herceptin was discontinued for a rising CA-27-29.  CA27.29 was 916.3 on 03/19/2013, 286.2 on 06/23/2013, 70.8 on 08/25/2013, 56.3 on 10/08/2013, 102.7 on 12/15/2013, 149.4 on 01/07/2014, 178.0 on 02/04/2014, 190.7 on 03/30/2014, 143.2 on 04/22/2014, 134.8 on 05/20/2014, 248.3 on 07/09/2014, and 723.2 on 08/25/2014.   She received Ibrance and Faslodex from 02/23/2014 until 07/27/2014.   PET scan on 08/28/2014 revealed significant interval worsening and multifocal osseous metastatic disease. Lesions had increased in number and metabolic activity. There were no extra osseous metastasis.  Symptomatically, she is doing well.  She denies any bone pain.  Exam is unremarkable.  CA27.29 is increasing.  Plan: 1.  Review PET scan.  Discuss discontinuation of Ibrance and Faslodex.  Discuss Her2/neu based therapy.  Discussed Xeloda and Herceptin.  Discussed TDM1 (Kadcyla).  Side effects of treatments reviewed in detail.  Information sheets provided.  Patient interested in pursuing TDM1. 2.  Continue monthly Xgeva today. 3.  Preauth TDM1 (Kadcyla). 4.  Echo:  Assess EF prior to Kadcyla. 5.  RTC for MD assessment, labs (CBC with diff, CMP, Mg), and cycle #1 Kadcyla.    Lequita Asal, MD  09/01/2014, 2:04 PM

## 2014-09-01 NOTE — Telephone Encounter (Signed)
Stopped at pharmacy and rechecked her b/p and it is 155/87, so it is coming down

## 2014-09-04 ENCOUNTER — Ambulatory Visit
Admission: RE | Admit: 2014-09-04 | Discharge: 2014-09-04 | Disposition: A | Discharge status: Home / Self Care | Payer: Medicare Other | Source: Ambulatory Visit | Attending: Hematology and Oncology | Admitting: Hematology and Oncology

## 2014-09-04 DIAGNOSIS — Z9221 Personal history of antineoplastic chemotherapy: Secondary | ICD-10-CM | POA: Insufficient documentation

## 2014-09-04 DIAGNOSIS — I071 Rheumatic tricuspid insufficiency: Secondary | ICD-10-CM | POA: Insufficient documentation

## 2014-09-04 DIAGNOSIS — I351 Nonrheumatic aortic (valve) insufficiency: Secondary | ICD-10-CM | POA: Diagnosis not present

## 2014-09-04 DIAGNOSIS — C50919 Malignant neoplasm of unspecified site of unspecified female breast: Secondary | ICD-10-CM | POA: Insufficient documentation

## 2014-09-04 DIAGNOSIS — R0689 Other abnormalities of breathing: Secondary | ICD-10-CM | POA: Diagnosis not present

## 2014-09-04 DIAGNOSIS — I340 Nonrheumatic mitral (valve) insufficiency: Secondary | ICD-10-CM | POA: Insufficient documentation

## 2014-09-04 NOTE — Progress Notes (Signed)
*  PRELIMINARY RESULTS* Echocardiogram 2D Echocardiogram has been performed.  Elizabeth Bond 09/04/2014, 12:38 PM

## 2014-09-08 ENCOUNTER — Other Ambulatory Visit: Payer: Self-pay | Admitting: Hematology and Oncology

## 2014-09-08 ENCOUNTER — Encounter: Payer: Self-pay | Admitting: Hematology and Oncology

## 2014-09-10 ENCOUNTER — Inpatient Hospital Stay: Payer: Medicare Other | Attending: Hematology and Oncology

## 2014-09-10 ENCOUNTER — Inpatient Hospital Stay (HOSPITAL_BASED_OUTPATIENT_CLINIC_OR_DEPARTMENT_OTHER): Payer: Medicare Other | Admitting: Hematology and Oncology

## 2014-09-10 ENCOUNTER — Inpatient Hospital Stay: Payer: Medicare Other

## 2014-09-10 VITALS — BP 141/84 | HR 76 | Temp 97.5°F | Resp 16 | Wt 119.0 lb

## 2014-09-10 DIAGNOSIS — C7951 Secondary malignant neoplasm of bone: Secondary | ICD-10-CM

## 2014-09-10 DIAGNOSIS — K589 Irritable bowel syndrome without diarrhea: Secondary | ICD-10-CM | POA: Diagnosis not present

## 2014-09-10 DIAGNOSIS — C50911 Malignant neoplasm of unspecified site of right female breast: Secondary | ICD-10-CM

## 2014-09-10 DIAGNOSIS — Z5111 Encounter for antineoplastic chemotherapy: Secondary | ICD-10-CM | POA: Diagnosis not present

## 2014-09-10 DIAGNOSIS — Z79899 Other long term (current) drug therapy: Secondary | ICD-10-CM | POA: Diagnosis not present

## 2014-09-10 DIAGNOSIS — C50919 Malignant neoplasm of unspecified site of unspecified female breast: Secondary | ICD-10-CM

## 2014-09-10 DIAGNOSIS — Z170 Estrogen receptor positive status [ER+]: Secondary | ICD-10-CM | POA: Diagnosis not present

## 2014-09-10 DIAGNOSIS — R490 Dysphonia: Secondary | ICD-10-CM | POA: Diagnosis not present

## 2014-09-10 DIAGNOSIS — Z87891 Personal history of nicotine dependence: Secondary | ICD-10-CM | POA: Diagnosis not present

## 2014-09-10 LAB — COMPREHENSIVE METABOLIC PANEL
ALT: 11 U/L — ABNORMAL LOW (ref 14–54)
AST: 23 U/L (ref 15–41)
Albumin: 4.3 g/dL (ref 3.5–5.0)
Alkaline Phosphatase: 51 U/L (ref 38–126)
Anion gap: 7 (ref 5–15)
BUN: 20 mg/dL (ref 6–20)
CO2: 28 mmol/L (ref 22–32)
Calcium: 9 mg/dL (ref 8.9–10.3)
Chloride: 99 mmol/L — ABNORMAL LOW (ref 101–111)
Creatinine, Ser: 0.89 mg/dL (ref 0.44–1.00)
GFR calc Af Amer: >60 mL/min (ref >60)
GFR calc non Af Amer: >60 mL/min (ref >60)
Glucose, Bld: 93 mg/dL (ref 65–99)
Potassium: 4.3 mmol/L (ref 3.5–5.1)
Sodium: 134 mmol/L — ABNORMAL LOW (ref 135–145)
Total Bilirubin: 0.5 mg/dL (ref 0.3–1.2)
Total Protein: 7.7 g/dL (ref 6.5–8.1)

## 2014-09-10 LAB — CBC WITH DIFFERENTIAL/PLATELET
Basophils Absolute: 0.1 10*3/uL (ref 0–0.1)
Basophils Relative: 1 %
Eosinophils Absolute: 0 10*3/uL (ref 0–0.7)
Eosinophils Relative: 1 %
HCT: 37.1 % (ref 35.0–47.0)
Hemoglobin: 12.6 g/dL (ref 12.0–16.0)
Lymphocytes Relative: 12 %
Lymphs Abs: 0.8 10*3/uL — ABNORMAL LOW (ref 1.0–3.6)
MCH: 34 pg (ref 26.0–34.0)
MCHC: 34.1 g/dL (ref 32.0–36.0)
MCV: 99.7 fL (ref 80.0–100.0)
Monocytes Absolute: 0.5 10*3/uL (ref 0.2–0.9)
Monocytes Relative: 7 %
Neutro Abs: 5.2 10*3/uL (ref 1.4–6.5)
Neutrophils Relative %: 79 %
Platelets: 276 10*3/uL (ref 150–440)
RBC: 3.72 MIL/uL — ABNORMAL LOW (ref 3.80–5.20)
RDW: 14.6 % — ABNORMAL HIGH (ref 11.5–14.5)
WBC: 6.5 10*3/uL (ref 3.6–11.0)

## 2014-09-10 LAB — MAGNESIUM: Magnesium: 2 mg/dL (ref 1.7–2.4)

## 2014-09-10 MED ORDER — HEPARIN SOD (PORK) LOCK FLUSH 100 UNIT/ML IV SOLN
500.0000 [IU] | Freq: Once | INTRAVENOUS | Status: AC
  Administered 2014-09-10: 500 [IU] via INTRAVENOUS
  Filled 2014-09-10: qty 5

## 2014-09-10 MED ORDER — PANTOPRAZOLE SODIUM 40 MG PO TBEC: 40.0000 mg | DELAYED_RELEASE_TABLET | Freq: Every day | ORAL | Status: DC

## 2014-09-10 MED ORDER — ONDANSETRON HCL 4 MG PO TABS: 4.0000 mg | ORAL_TABLET | Freq: Three times a day (TID) | ORAL | Status: DC | PRN

## 2014-09-10 MED ORDER — SODIUM CHLORIDE 0.9 % IV SOLN
Freq: Once | INTRAVENOUS | Status: AC
  Administered 2014-09-10: 11:00:00 via INTRAVENOUS
  Filled 2014-09-10: qty 1000

## 2014-09-10 MED ORDER — SODIUM CHLORIDE 0.9 % IJ SOLN
10.0000 mL | INTRAMUSCULAR | Status: DC | PRN
  Administered 2014-09-10: 10 mL via INTRAVENOUS
  Filled 2014-09-10: qty 10

## 2014-09-10 MED ORDER — DIPHENHYDRAMINE HCL 25 MG PO CAPS
50.0000 mg | ORAL_CAPSULE | Freq: Once | ORAL | Status: AC
  Administered 2014-09-10: 50 mg via ORAL
  Filled 2014-09-10: qty 2

## 2014-09-10 MED ORDER — SODIUM CHLORIDE 0.9 % IV SOLN
3.6000 mg/kg | Freq: Once | INTRAVENOUS | Status: AC
  Administered 2014-09-10: 200 mg via INTRAVENOUS
  Filled 2014-09-10: qty 10

## 2014-09-10 MED ORDER — ACETAMINOPHEN 325 MG PO TABS
650.0000 mg | ORAL_TABLET | Freq: Once | ORAL | Status: AC
  Administered 2014-09-10: 650 mg via ORAL
  Filled 2014-09-10: qty 2

## 2014-09-10 NOTE — Progress Notes (Addendum)
[facility] day:  09/10/2014  Chief Complaint: Elizabeth Bond is a 64 y.o. female with metastatic Her2/neu positive breast cancer who is seen for assessment prior to cycle #1 Kadcyla.  HPI:  The patient was last seen in the medical oncology clinic on 09/01/2014.  At that time, interval PET scan was reviewed.  Scan revealed progressive disease.  Treatment options were reviewed.  Information was provided.  Patient opted to begin Kadcyla.  Echocardiogram on 09/04/2014 revealed an EF of 55-65%.  During the interim, the patient has done well. She voices no new complaints.  Past Medical History  Diagnosis Date  . IBS (irritable bowel syndrome)   . Cancer 2015    breast and bone  . Breast cancer     right  . 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 Replacement total knee Left 2004-2005?  Marland Kitchen Abdominal hysterectomy  1978  . Portacath placement  2015    Family History  Problem Relation Age of Onset  . Cancer Father     prostate  . Cancer Maternal Aunt     breast  . Cancer Cousin     breast  . Cancer Other     lung cancer - neice  . Cancer - Other Daughter     brain  . Cancer Mother     Pancreatic    Social History:  reports that she quit smoking about 17 months ago. Her smoking use included Cigarettes. She has a 10 pack-year smoking history. She has never used smokeless tobacco. She reports that she drinks alcohol. She reports that she does not use illicit drugs.  The patient is accompanied by her sister, Mariann Laster, today.  Allergies:  Allergies  Allergen Reactions  . Fentanyl Other (See Comments)    "loopy and confused"    Current Medications: Current Outpatient Prescriptions  Medication Sig Dispense Refill  . acetaminophen (TYLENOL) 500 MG tablet Take 1,000 mg by mouth every 8 (eight) hours as needed for headache.    . Calcium 600-200 MG-UNIT per tablet Take 1 tablet by mouth 2 (two) times  daily. 60 tablet 3  . calcium carbonate (OS-CAL) 600 MG TABS tablet Take 600 mg by mouth 2 (two) times daily with a meal.    . Cholecalciferol (VITAMIN D3) 5000 UNITS CAPS Take 1 capsule (5,000 Units total) by mouth daily. 30 capsule 3  . clonazePAM (KLONOPIN) 0.5 MG tablet Take 1 tablet (0.5 mg total) by mouth 3 (three) times daily as needed for anxiety. 90 tablet 2  . furosemide (LASIX) 20 MG tablet 1 tab by mouth every other day as needed for edema 30 tablet 2  . pantoprazole (PROTONIX) 40 MG tablet Take 1 tablet (40 mg total) by mouth daily. 30 tablet 6  . phosphorus (PHOSPHA 250 NEUTRAL) 155-852-130 MG tablet Take 1 tablet (250 mg total) by mouth 2 (two) times daily. 60 tablet 2  . potassium chloride (K-DUR) 10 MEQ tablet Take 1 tablet (10 mEq total) by mouth daily. 30 tablet 2  . ondansetron (ZOFRAN) 4 MG tablet Take 1 tablet (4 mg total) by mouth every 8 (eight) hours as needed for nausea or vomiting. 20 tablet 1  . oxycodone (OXY-IR) 5 MG capsule Take 1 -2 tablets every 4 hours by mouth as needed 60 capsule 0  . OxyCODONE (OXYCONTIN) 20 mg T12A 12 hr tablet Take 1 tablet (20 mg total) by mouth every 12 (twelve)  hours. 60 tablet 0   No current facility-administered medications for this visit.    Review of Systems:  GENERAL:  Feels "ok".  No fevers, sweats or weight loss. PERFORMANCE STATUS (ECOG):  0 HEENT:  No visual changes, runny nose, sore throat, mouth sores or tenderness. Lungs: No shortness of breath or cough.  No hemoptysis. Cardiac:  No chest pain, palpitations, orthopnea, or PND. GI:  No nausea, vomiting, diarrhea, constipation, melena or hematochezia.  Unaware of last colonoscopy. GU:  No urgency, frequency, dysuria, or hematuria. Musculoskeletal:  No back pain.  No joint pain.  No muscle tenderness. Extremities:  No pain or swelling. Skin:  No rashes or skin changes. Neuro:  No headache, numbness or weakness, balance or coordination issues. Endocrine:  No diabetes,  thyroid issues, hot flashes or night sweats. Psych:  No mood changes, depression or anxiety. Pain:  No focal pain. Review of systems:  All other systems reviewed and found to be negative.   Physical Exam: Blood pressure 141/84, pulse 76, temperature 97.5 F (36.4 C), temperature source Tympanic, resp. rate 16, weight 119 lb 0.8 oz (54 kg). GENERAL:  Well developed, well nourished, sitting comfortably in the exam room in no acute distress. MENTAL STATUS:  Alert and oriented to person, place and time. HEAD:  Short styled gray hair.  Normocephalic, atraumatic, face symmetric, no Cushingoid features. EYES:  Hazel eyes.  Pupils equal round and reactive to light and accomodation.  No conjunctivitis or scleral icterus. ENT:  Oropharynx clear without lesion.  Tongue normal. Mucous membranes moist.  RESPIRATORY:  Clear to auscultation without rales, wheezes or rhonchi. CARDIOVASCULAR:  Regular rate and rhythm without murmur, rub or gallop. ABDOMEN:  Soft, non-tender, with active bowel sounds, and no hepatosplenomegaly.  No masses. SKIN:  No rashes, ulcers or lesions. EXTREMITIES: No edema, no skin discoloration or tenderness.  No palpable cords. LYMPH NODES: No palpable cervical, supraclavicular, axillary or inguinal adenopathy  NEUROLOGICAL: Unremarkable. PSYCH:  Appropriate.  NEUROLOGICAL: Unremarkable.  Infusion on 09/10/2014  Component Date Value Ref Range Status  . WBC 09/10/2014 6.5  3.6 - 11.0 K/uL Final   A-LINE DRAW  . RBC 09/10/2014 3.72* 3.80 - 5.20 MIL/uL Final  . Hemoglobin 09/10/2014 12.6  12.0 - 16.0 g/dL Final  . HCT 09/10/2014 37.1  35.0 - 47.0 % Final  . MCV 09/10/2014 99.7  80.0 - 100.0 fL Final  . MCH 09/10/2014 34.0  26.0 - 34.0 pg Final  . MCHC 09/10/2014 34.1  32.0 - 36.0 g/dL Final  . RDW 09/10/2014 14.6* 11.5 - 14.5 % Final  . Platelets 09/10/2014 276  150 - 440 K/uL Final  . Neutrophils Relative % 09/10/2014 79   Final  . Neutro Abs 09/10/2014 5.2  1.4 - 6.5 K/uL  Final  . Lymphocytes Relative 09/10/2014 12   Final  . Lymphs Abs 09/10/2014 0.8* 1.0 - 3.6 K/uL Final  . Monocytes Relative 09/10/2014 7   Final  . Monocytes Absolute 09/10/2014 0.5  0.2 - 0.9 K/uL Final  . Eosinophils Relative 09/10/2014 1   Final  . Eosinophils Absolute 09/10/2014 0.0  0 - 0.7 K/uL Final  . Basophils Relative 09/10/2014 1   Final  . Basophils Absolute 09/10/2014 0.1  0 - 0.1 K/uL Final  . Sodium 09/10/2014 134* 135 - 145 mmol/L Final  . Potassium 09/10/2014 4.3  3.5 - 5.1 mmol/L Final  . Chloride 09/10/2014 99* 101 - 111 mmol/L Final  . CO2 09/10/2014 28  22 - 32 mmol/L Final  .  Glucose, Bld 09/10/2014 93  65 - 99 mg/dL Final  . BUN 09/10/2014 20  6 - 20 mg/dL Final  . Creatinine, Ser 09/10/2014 0.89  0.44 - 1.00 mg/dL Final  . Calcium 09/10/2014 9.0  8.9 - 10.3 mg/dL Final  . Total Protein 09/10/2014 7.7  6.5 - 8.1 g/dL Final  . Albumin 09/10/2014 4.3  3.5 - 5.0 g/dL Final  . AST 09/10/2014 23  15 - 41 U/L Final  . ALT 09/10/2014 11* 14 - 54 U/L Final  . Alkaline Phosphatase 09/10/2014 51  38 - 126 U/L Final  . Total Bilirubin 09/10/2014 0.5  0.3 - 1.2 mg/dL Final  . GFR calc non Af Amer 09/10/2014 >60  >60 mL/min Final  . GFR calc Af Amer 09/10/2014 >60  >60 mL/min Final   Comment: (NOTE) The eGFR has been calculated using the CKD EPI equation. This calculation has not been validated in all clinical situations. eGFR's persistently <60 mL/min signify possible Chronic Kidney Disease.   . Anion gap 09/10/2014 7  5 - 15 Final  . Magnesium 09/10/2014 2.0  1.7 - 2.4 mg/dL Final    Assessment:  Elizabeth Bond is a 64 y.o. female with metastatic Her2/neu positive right breast cancer.  She initially presented in 03/2013 with an ulcerated breast mass and back pain.  Breast biopsy revealed lobular breast cancer.  Tumor was ER/PR positive and HER-2/neu positive. PET scan revealed bone metastasis. She received palliative radiation to T8 and T10 from 04/02-04/15/2015.  She  declined chemotherapy. She initially began letrozole and Tykerb. Tykerb was discontinued after 1 dose. She received letrozole from 05/02/2013 until 01/07/2014 .  She received Herceptin from 06/02/2013 until 12/15/2013. She has received fulvestrant monthly from 01/07/2014 until 05/27/2014.  She has received Xgeva monthly (03/31/2013 until 07/27/2014).  Notes indicate Herceptin was discontinued for a rising CA-27-29.  CA27.29 was 916.3 on 03/19/2013, 286.2 on 06/23/2013, 70.8 on 08/25/2013, 56.3 on 10/08/2013, 102.7 on 12/15/2013, 149.4 on 01/07/2014, 178.0 on 02/04/2014, 190.7 on 03/30/2014, 143.2 on 04/22/2014, 134.8 on 05/20/2014, 248.3 on 07/09/2014, and 723.2 on 08/25/2014.   She received Ibrance and Faslodex from 02/23/2014 until 07/27/2014.  Echo on 09/04/2014 revealed an EF of 55-65%  PET scan on 08/28/2014 revealed significant interval worsening and multifocal osseous metastatic disease. Lesions had increased in number and metabolic activity. There were no extra osseous metastasis.  Symptomatically, she is doing well.  She denies any bone pain.  Exam is unremarkable.  Plan: 1.  Labs today:  CBC with diff, CMP, Mg. 2.  Review potential side effects of treatment.  Patient consented to treatment. 3.  Kadcyla cycle #1. 4.  Rx:  Ondansetron. 5.  RTC 10 days for MD nadir assessment and labs (CBC with diff).    Lequita Asal, MD  09/10/2014, 9:03 AM

## 2014-09-10 NOTE — Progress Notes (Signed)
Protonix Refilled per Dr. Zenia Resides. RX initially sent to speciality pharmacy, who does not fill this RX. A new RX was submitted to local pharmacy on 09/11/14 at 1403

## 2014-09-11 MED ORDER — PANTOPRAZOLE SODIUM 40 MG PO TBEC
40.0000 mg | DELAYED_RELEASE_TABLET | Freq: Every day | ORAL | Status: DC
Start: 2014-09-11 — End: 2014-10-12

## 2014-09-15 ENCOUNTER — Other Ambulatory Visit: Payer: Self-pay | Admitting: *Deleted

## 2014-09-15 DIAGNOSIS — C50919 Malignant neoplasm of unspecified site of unspecified female breast: Secondary | ICD-10-CM

## 2014-09-15 MED ORDER — ONDANSETRON HCL 4 MG PO TABS: 4.0000 mg | ORAL_TABLET | Freq: Three times a day (TID) | ORAL | Status: DC | PRN

## 2014-09-15 MED ORDER — OXYCODONE HCL 5 MG PO CAPS: ORAL_CAPSULE | ORAL | Status: DC

## 2014-09-15 MED ORDER — OXYCODONE HCL ER 20 MG PO T12A: 20.0000 mg | EXTENDED_RELEASE_TABLET | Freq: Two times a day (BID) | ORAL | Status: DC

## 2014-09-15 NOTE — Telephone Encounter (Signed)
Zofran did not get called in, it was sent to specialty pharmacy so I resubmitted it to loccal pharmacy. Also will need Oxycodone 20 and 5 mg tabs when she comes in Friday

## 2014-09-18 ENCOUNTER — Other Ambulatory Visit: Payer: Self-pay

## 2014-09-18 ENCOUNTER — Ambulatory Visit: Payer: No Typology Code available for payment source | Admitting: Hematology and Oncology

## 2014-09-18 ENCOUNTER — Ambulatory Visit: Payer: No Typology Code available for payment source

## 2014-09-18 ENCOUNTER — Other Ambulatory Visit: Payer: No Typology Code available for payment source

## 2014-09-18 ENCOUNTER — Inpatient Hospital Stay: Payer: Medicare Other

## 2014-09-18 ENCOUNTER — Inpatient Hospital Stay (HOSPITAL_BASED_OUTPATIENT_CLINIC_OR_DEPARTMENT_OTHER): Payer: Medicare Other | Admitting: Hematology and Oncology

## 2014-09-18 DIAGNOSIS — C7951 Secondary malignant neoplasm of bone: Secondary | ICD-10-CM | POA: Diagnosis not present

## 2014-09-18 DIAGNOSIS — C50919 Malignant neoplasm of unspecified site of unspecified female breast: Secondary | ICD-10-CM

## 2014-09-18 DIAGNOSIS — R490 Dysphonia: Secondary | ICD-10-CM | POA: Diagnosis not present

## 2014-09-18 DIAGNOSIS — Z170 Estrogen receptor positive status [ER+]: Secondary | ICD-10-CM | POA: Diagnosis not present

## 2014-09-18 DIAGNOSIS — Z5111 Encounter for antineoplastic chemotherapy: Secondary | ICD-10-CM | POA: Diagnosis not present

## 2014-09-18 DIAGNOSIS — Z79899 Other long term (current) drug therapy: Secondary | ICD-10-CM | POA: Diagnosis not present

## 2014-09-18 DIAGNOSIS — C50911 Malignant neoplasm of unspecified site of right female breast: Secondary | ICD-10-CM

## 2014-09-18 LAB — CBC WITH DIFFERENTIAL/PLATELET
Basophils Absolute: 0.1 10*3/uL (ref 0–0.1)
Basophils Relative: 1 %
Eosinophils Absolute: 0.1 10*3/uL (ref 0–0.7)
Eosinophils Relative: 2 %
HCT: 36.2 % (ref 35.0–47.0)
Hemoglobin: 12.4 g/dL (ref 12.0–16.0)
Lymphocytes Relative: 14 %
Lymphs Abs: 0.9 10*3/uL — ABNORMAL LOW (ref 1.0–3.6)
MCH: 33.5 pg (ref 26.0–34.0)
MCHC: 34.4 g/dL (ref 32.0–36.0)
MCV: 97.6 fL (ref 80.0–100.0)
Monocytes Absolute: 0.6 10*3/uL (ref 0.2–0.9)
Monocytes Relative: 9 %
Neutro Abs: 4.9 10*3/uL (ref 1.4–6.5)
Neutrophils Relative %: 74 %
Platelets: 166 10*3/uL (ref 150–440)
RBC: 3.71 MIL/uL — ABNORMAL LOW (ref 3.80–5.20)
RDW: 14.3 % (ref 11.5–14.5)
WBC: 6.7 10*3/uL (ref 3.6–11.0)

## 2014-09-18 NOTE — Progress Notes (Unsigned)
Pt reports having some new hoarseness, chills but no fever, and a decrease in appetite.

## 2014-09-18 NOTE — Progress Notes (Signed)
Elizabeth Bond day:  09/18/2014  Chief Complaint: Elizabeth Bond is a 64 y.o. female with metastatic Her2/neu positive breast cancer who is seen for assessment on day 9 of cycle #1 Kadcyla.  HPI:  The patient was last seen in the medical oncology [facility] on 09/10/2014.  At that time, she received her first cycle of Kadcyla.  She tolerated her infusion well.  During the interim, she has done well. She notes a little decrease in her appetite. Weight has been stable. She notes little bit of nausea but no vomiting. She has not taken any antiemetics. She has some hoarseness which comes and goes.  She denies any fever.  Past Medical History  Diagnosis Date  . IBS (irritable bowel syndrome)   . Cancer 2015    breast and bone  . Breast cancer     right  . 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 Replacement total knee Left 2004-2005?  Marland Kitchen Abdominal hysterectomy  1978  . Portacath placement  2015    Family History  Problem Relation Age of Onset  . Cancer Father     prostate  . Cancer Maternal Aunt     breast  . Cancer Cousin     breast  . Cancer Other     lung cancer - neice  . Cancer - Other Daughter     brain  . Cancer Mother     Pancreatic    Social History:  reports that she quit smoking about 17 months ago. Her smoking use included Cigarettes. She has a 10 pack-year smoking history. She has never used smokeless tobacco. She reports that she drinks alcohol. She reports that she does not use illicit drugs.  The patient is alone today.  Allergies:  Allergies  Allergen Reactions  . Fentanyl Other (See Comments)    "loopy and confused"    Current Medications: Current Outpatient Prescriptions  Medication Sig Dispense Refill  . acetaminophen (TYLENOL) 500 MG tablet Take 1,000 mg by mouth every 8 (eight) hours as needed for headache.    . Calcium 600-200 MG-UNIT per tablet Take 1 tablet by  mouth 2 (two) times daily. 60 tablet 3  . calcium carbonate (OS-CAL) 600 MG TABS tablet Take 600 mg by mouth 2 (two) times daily with a meal.    . Cholecalciferol (VITAMIN D3) 5000 UNITS CAPS Take 1 capsule (5,000 Units total) by mouth daily. 30 capsule 3  . clonazePAM (KLONOPIN) 0.5 MG tablet Take 1 tablet (0.5 mg total) by mouth 3 (three) times daily as needed for anxiety. 90 tablet 2  . furosemide (LASIX) 20 MG tablet 1 tab by mouth every other day as needed for edema 30 tablet 2  . ondansetron (ZOFRAN) 4 MG tablet Take 1 tablet (4 mg total) by mouth every 8 (eight) hours as needed for nausea or vomiting. 20 tablet 1  . oxycodone (OXY-IR) 5 MG capsule Take 1 -2 tablets every 4 hours by mouth as needed 60 capsule 0  . OxyCODONE (OXYCONTIN) 20 mg T12A 12 hr tablet Take 1 tablet (20 mg total) by mouth every 12 (twelve) hours. 60 tablet 0  . pantoprazole (PROTONIX) 40 MG tablet Take 1 tablet (40 mg total) by mouth daily. 30 tablet 6  . phosphorus (PHOSPHA 250 NEUTRAL) 155-852-130 MG tablet Take 1 tablet (250 mg total) by mouth 2 (two) times daily. 60 tablet 2  . potassium chloride (K-DUR)  10 MEQ tablet Take 1 tablet (10 mEq total) by mouth daily. 30 tablet 2   No current facility-administered medications for this visit.    Review of Systems:  GENERAL:  Feels good.  No fevers, sweats or weight loss. PERFORMANCE STATUS (ECOG):  0 HEENT:  Hoarseness comes and goes.  No visual changes, runny nose, sore throat, mouth sores or tenderness. Lungs: No shortness of breath or cough.  No hemoptysis. Cardiac:  No chest pain, palpitations, orthopnea, or PND. GI:  Little nausea.  No vomiting, diarrhea, constipation, melena or hematochezia. GU:  No urgency, frequency, dysuria, or hematuria. Musculoskeletal:  No back pain.  No joint pain.  No muscle tenderness. Extremities:  No pain or swelling. Skin:  No rashes or skin changes. Neuro:  No headache, numbness or weakness, balance or coordination  issues. Endocrine:  No diabetes, thyroid issues, hot flashes or night sweats. Psych:  No mood changes, depression or anxiety. Pain:  No focal pain. Review of systems:  All other systems reviewed and found to be negative.   Physical Exam: There were no vitals taken for this visit. GENERAL:  Well developed, well nourished, sitting comfortably in the exam room in no acute distress. MENTAL STATUS:  Alert and oriented to person, place and time. HEAD:  Short styled gray hair.  Normocephalic, atraumatic, face symmetric, no Cushingoid features. EYES: Hazel eyes.  Pupils equal round and reactive to light and accomodation.  No conjunctivitis or scleral icterus. ENT:  Oropharynx clear without lesion.  Tongue normal. Mucous membranes moist.  RESPIRATORY:  Clear to auscultation without rales, wheezes or rhonchi. CARDIOVASCULAR:  Regular rate and rhythm without murmur, rub or gallop. ABDOMEN:  Soft, non-tender, with active bowel sounds, and no hepatosplenomegaly.  No masses. SKIN:  No rashes, ulcers or lesions. EXTREMITIES: No edema, no skin discoloration or tenderness.  No palpable cords. LYMPH NODES: No palpable cervical, supraclavicular, axillary or inguinal adenopathy  NEUROLOGICAL: Unremarkable. PSYCH:  Appropriate.  NEUROLOGICAL: Unremarkable.  Clinical Support on 09/18/2014  Component Date Value Ref Range Status  . WBC 09/18/2014 6.7  3.6 - 11.0 K/uL Final  . RBC 09/18/2014 3.71* 3.80 - 5.20 MIL/uL Final  . Hemoglobin 09/18/2014 12.4  12.0 - 16.0 g/dL Final  . HCT 09/18/2014 36.2  35.0 - 47.0 % Final  . MCV 09/18/2014 97.6  80.0 - 100.0 fL Final  . MCH 09/18/2014 33.5  26.0 - 34.0 pg Final  . MCHC 09/18/2014 34.4  32.0 - 36.0 g/dL Final  . RDW 09/18/2014 14.3  11.5 - 14.5 % Final  . Platelets 09/18/2014 166  150 - 440 K/uL Final  . Neutrophils Relative % 09/18/2014 74   Final  . Neutro Abs 09/18/2014 4.9  1.4 - 6.5 K/uL Final  . Lymphocytes Relative 09/18/2014 14   Final  . Lymphs Abs  09/18/2014 0.9* 1.0 - 3.6 K/uL Final  . Monocytes Relative 09/18/2014 9   Final  . Monocytes Absolute 09/18/2014 0.6  0.2 - 0.9 K/uL Final  . Eosinophils Relative 09/18/2014 2   Final  . Eosinophils Absolute 09/18/2014 0.1  0 - 0.7 K/uL Final  . Basophils Relative 09/18/2014 1   Final  . Basophils Absolute 09/18/2014 0.1  0 - 0.1 K/uL Final    Assessment:  ADAMARY SAVARY is a 64 y.o. female with metastatic Her2/neu positive right breast cancer.  She initially presented in 03/2013 with an ulcerated breast mass and back pain.  Breast biopsy revealed lobular breast cancer.  Tumor was ER/PR positive and  HER-2/neu positive. PET scan revealed bone metastasis. She received palliative radiation to T8 and T10 from 04/02-04/15/2015.  She declined chemotherapy. She initially began letrozole and Tykerb. Tykerb was discontinued after 1 dose. She received letrozole from 05/02/2013 until 01/07/2014 .  She received Herceptin from 06/02/2013 until 12/15/2013. She has received fulvestrant monthly from 01/07/2014 until 05/27/2014.  She has received Xgeva monthly (03/31/2013 until 07/27/2014).  Notes indicate Herceptin was discontinued for a rising CA-27-29.  CA27.29 was 916.3 on 03/19/2013, 286.2 on 06/23/2013, 70.8 on 08/25/2013, 56.3 on 10/08/2013, 102.7 on 12/15/2013, 149.4 on 01/07/2014, 178.0 on 02/04/2014, 190.7 on 03/30/2014, 143.2 on 04/22/2014, 134.8 on 05/20/2014, 248.3 on 07/09/2014, and 723.2 on 08/25/2014.   She received Ibrance and Faslodex from 02/23/2014 until 07/27/2014.  Echo on 09/04/2014 revealed an EF of 55-65%  PET scan on 08/28/2014 revealed significant interval worsening and multifocal osseous metastatic disease. Lesions had increased in number and metabolic activity. There were no extra osseous metastasis.  She is currently day 9 s/p cycle #1 Kadcyla (began 09/10/2014).  She tolerated her infusion well.  She has had some mild nausea and decreased appetite.  Weight is stable.  She notes  intermittent hoarseness.  Counts are good.  Plan: 1.  Labs today:  CBC with diff. 2.  Review fever and neutropenia precautions. 3.  RTC on 10/01/2014 for MD assessment, labs (CBC with diff, CMP, Mg, CA27.29), and cycle #2 Kadcyla (TDM-1; trastuzumab emtansine).    Lequita Asal, MD  09/18/2014, 11:13 AM

## 2014-09-19 ENCOUNTER — Encounter: Payer: Self-pay | Admitting: Hematology and Oncology

## 2014-09-22 ENCOUNTER — Telehealth: Payer: Self-pay | Admitting: *Deleted

## 2014-09-22 NOTE — Telephone Encounter (Signed)
  Can I talk to her?  M

## 2014-09-22 NOTE — Telephone Encounter (Signed)
Askingto speak with Dr Mike Gip since she had to work during her mothers last visit

## 2014-09-23 ENCOUNTER — Telehealth: Payer: Self-pay | Admitting: *Deleted

## 2014-09-23 MED ORDER — K PHOS MONO-SOD PHOS DI & MONO 155-852-130 MG PO TABS: 250.0000 mg | ORAL_TABLET | Freq: Two times a day (BID) | ORAL | Status: DC

## 2014-09-23 MED ORDER — POTASSIUM CHLORIDE ER 10 MEQ PO TBCR: 10.0000 meq | EXTENDED_RELEASE_TABLET | Freq: Every day | ORAL | Status: DC

## 2014-09-23 NOTE — Telephone Encounter (Signed)
Escribed

## 2014-09-25 ENCOUNTER — Other Ambulatory Visit: Payer: Self-pay

## 2014-09-28 ENCOUNTER — Other Ambulatory Visit: Payer: Self-pay

## 2014-09-28 DIAGNOSIS — C50919 Malignant neoplasm of unspecified site of unspecified female breast: Secondary | ICD-10-CM

## 2014-10-01 ENCOUNTER — Inpatient Hospital Stay: Payer: Medicare Other

## 2014-10-01 ENCOUNTER — Inpatient Hospital Stay: Payer: Medicare Other | Admitting: Hematology and Oncology

## 2014-10-05 ENCOUNTER — Telehealth: Payer: Self-pay | Admitting: *Deleted

## 2014-10-05 MED ORDER — FUROSEMIDE 20 MG PO TABS: ORAL_TABLET | ORAL | Status: DC

## 2014-10-05 NOTE — Telephone Encounter (Signed)
  Ok to refill.  However, I don't think we prescribed that medication.  She needs to go to PCP or ER.  M

## 2014-10-05 NOTE — Telephone Encounter (Signed)
Having intermittent chest pain causing her to have to sit down lasting 3 -4 mins it does not radiate down arm or up into neck. SHE is also having shortness of breath. Asking about her lasix rx, she has been using it daily and we have qod, she is out and needs a refill

## 2014-10-05 NOTE — Telephone Encounter (Signed)
Instructed to go to ER or to PMD for cardiac evaluation. Crystal stated that she won't do it, but she will try to get her to go

## 2014-10-08 ENCOUNTER — Telehealth: Payer: Self-pay

## 2014-10-08 ENCOUNTER — Inpatient Hospital Stay (HOSPITAL_BASED_OUTPATIENT_CLINIC_OR_DEPARTMENT_OTHER): Payer: Medicare Other | Admitting: Hematology and Oncology

## 2014-10-08 ENCOUNTER — Inpatient Hospital Stay: Payer: Medicare Other

## 2014-10-08 ENCOUNTER — Inpatient Hospital Stay: Payer: Medicare Other | Attending: Hematology and Oncology

## 2014-10-08 VITALS — BP 163/90 | HR 73 | Temp 97.4°F | Resp 18 | Ht 65.0 in | Wt 120.9 lb

## 2014-10-08 DIAGNOSIS — R0602 Shortness of breath: Secondary | ICD-10-CM | POA: Insufficient documentation

## 2014-10-08 DIAGNOSIS — C50911 Malignant neoplasm of unspecified site of right female breast: Secondary | ICD-10-CM

## 2014-10-08 DIAGNOSIS — Z170 Estrogen receptor positive status [ER+]: Secondary | ICD-10-CM | POA: Insufficient documentation

## 2014-10-08 DIAGNOSIS — K589 Irritable bowel syndrome without diarrhea: Secondary | ICD-10-CM | POA: Diagnosis not present

## 2014-10-08 DIAGNOSIS — Z5112 Encounter for antineoplastic immunotherapy: Secondary | ICD-10-CM | POA: Insufficient documentation

## 2014-10-08 DIAGNOSIS — C50919 Malignant neoplasm of unspecified site of unspecified female breast: Secondary | ICD-10-CM

## 2014-10-08 DIAGNOSIS — C7951 Secondary malignant neoplasm of bone: Secondary | ICD-10-CM | POA: Diagnosis not present

## 2014-10-08 DIAGNOSIS — R42 Dizziness and giddiness: Secondary | ICD-10-CM | POA: Diagnosis not present

## 2014-10-08 DIAGNOSIS — Z79899 Other long term (current) drug therapy: Secondary | ICD-10-CM

## 2014-10-08 DIAGNOSIS — R079 Chest pain, unspecified: Secondary | ICD-10-CM | POA: Diagnosis not present

## 2014-10-08 DIAGNOSIS — Z87891 Personal history of nicotine dependence: Secondary | ICD-10-CM | POA: Diagnosis not present

## 2014-10-08 LAB — CBC WITH DIFFERENTIAL/PLATELET
Basophils Absolute: 0 10*3/uL (ref 0–0.1)
Basophils Relative: 1 %
Eosinophils Absolute: 0.1 10*3/uL (ref 0–0.7)
Eosinophils Relative: 1 %
HCT: 37 % (ref 35.0–47.0)
Hemoglobin: 12.6 g/dL (ref 12.0–16.0)
Lymphocytes Relative: 10 %
Lymphs Abs: 0.7 10*3/uL — ABNORMAL LOW (ref 1.0–3.6)
MCH: 32.7 pg (ref 26.0–34.0)
MCHC: 34 g/dL (ref 32.0–36.0)
MCV: 96.3 fL (ref 80.0–100.0)
Monocytes Absolute: 0.4 10*3/uL (ref 0.2–0.9)
Monocytes Relative: 5 %
Neutro Abs: 6.5 10*3/uL (ref 1.4–6.5)
Neutrophils Relative %: 85 %
Platelets: 206 10*3/uL (ref 150–440)
RBC: 3.84 MIL/uL (ref 3.80–5.20)
RDW: 14.5 % (ref 11.5–14.5)
WBC: 7.7 10*3/uL (ref 3.6–11.0)

## 2014-10-08 LAB — COMPREHENSIVE METABOLIC PANEL
ALT: 13 U/L — ABNORMAL LOW (ref 14–54)
AST: 21 U/L (ref 15–41)
Albumin: 3.8 g/dL (ref 3.5–5.0)
Alkaline Phosphatase: 61 U/L (ref 38–126)
Anion gap: 6 (ref 5–15)
BUN: 16 mg/dL (ref 6–20)
CO2: 25 mmol/L (ref 22–32)
Calcium: 8 mg/dL — ABNORMAL LOW (ref 8.9–10.3)
Chloride: 105 mmol/L (ref 101–111)
Creatinine, Ser: 0.7 mg/dL (ref 0.44–1.00)
GFR calc Af Amer: >60 mL/min (ref >60)
GFR calc non Af Amer: >60 mL/min (ref >60)
Glucose, Bld: 89 mg/dL (ref 65–99)
Potassium: 4.1 mmol/L (ref 3.5–5.1)
Sodium: 136 mmol/L (ref 135–145)
Total Bilirubin: 0.5 mg/dL (ref 0.3–1.2)
Total Protein: 7.2 g/dL (ref 6.5–8.1)

## 2014-10-08 LAB — MAGNESIUM: Magnesium: 2 mg/dL (ref 1.7–2.4)

## 2014-10-08 MED ORDER — SODIUM CHLORIDE 0.9 % IJ SOLN
10.0000 mL | INTRAMUSCULAR | Status: DC | PRN
  Administered 2014-10-08 (×2): 10 mL
  Filled 2014-10-08: qty 10

## 2014-10-08 MED ORDER — ADO-TRASTUZUMAB EMTANSINE CHEMO INJECTION 160 MG
3.6000 mg/kg | Freq: Once | INTRAVENOUS | Status: AC
  Administered 2014-10-08: 200 mg via INTRAVENOUS
  Filled 2014-10-08: qty 10

## 2014-10-08 MED ORDER — HEPARIN SOD (PORK) LOCK FLUSH 100 UNIT/ML IV SOLN
500.0000 [IU] | Freq: Once | INTRAVENOUS | Status: AC | PRN
  Administered 2014-10-08: 500 [IU]
  Filled 2014-10-08: qty 5

## 2014-10-08 MED ORDER — SODIUM CHLORIDE 0.9 % IV SOLN
Freq: Once | INTRAVENOUS | Status: AC
  Administered 2014-10-08: 11:00:00 via INTRAVENOUS
  Filled 2014-10-08: qty 1000

## 2014-10-08 MED ORDER — ACETAMINOPHEN 325 MG PO TABS
650.0000 mg | ORAL_TABLET | Freq: Once | ORAL | Status: AC
  Administered 2014-10-08: 650 mg via ORAL
  Filled 2014-10-08: qty 2

## 2014-10-08 MED ORDER — DIPHENHYDRAMINE HCL 25 MG PO CAPS
50.0000 mg | ORAL_CAPSULE | Freq: Once | ORAL | Status: AC
  Administered 2014-10-08: 50 mg via ORAL
  Filled 2014-10-08: qty 2

## 2014-10-08 NOTE — Progress Notes (Signed)
[facility] day:  10/08/2014   Chief Complaint: Elizabeth Bond is a 64 y.o. female with metastatic Her2/neu positive breast cancer who is seen for assessment prior to cycle #2 Kadcyla.  HPI:  The patient was last seen in the medical oncology clinic on 09/18/2014.  At that time, she was day 9 status post cycle #1 Kadcyla.  She was doing well. She noted a little decrease in her appetite. Weight was stable. She had little bit of nausea, but no vomiting. She had not taken any antiemetics.   During the interim, patient notes an episode of chest pain and shortness of breath.  Pain was in the epigastric region. She denied any left arm numbness.  She felt a little lightheaded.  Pain eased off in a recliner.  One week ago she had an episode while eating a cookie. It lasted about 5-10 minutes. She felt no chest pressure or pain.  She felt a little shaky/dizzy.  Currently she feels good.  She states her nerve pills are not working.  She states that she feels good after treatment.  She only notes a little nausea.  Past Medical History  Diagnosis Date  . IBS (irritable bowel syndrome)   . Cancer 2015    breast and bone  . Breast cancer     right  . 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 Replacement total knee Left 2004-2005?  Marland Kitchen Abdominal hysterectomy  1978  . Portacath placement  2015    Family History  Problem Relation Age of Onset  . Cancer Father     prostate  . Cancer Maternal Aunt     breast  . Cancer Cousin     breast  . Cancer Other     lung cancer - neice  . Cancer - Other Daughter     brain  . Cancer Mother     Pancreatic    Social History:  reports that she quit smoking about 18 months ago. Her smoking use included Cigarettes. She has a 10 pack-year smoking history. She has never used smokeless tobacco. She reports that she drinks alcohol. She reports that she does not use illicit drugs.   The patient is accompanied by her daughter.  Allergies:  Allergies  Allergen Reactions  . Fentanyl Other (See Comments)    "loopy and confused"    Current Medications: Current Outpatient Prescriptions  Medication Sig Dispense Refill  . acetaminophen (TYLENOL) 500 MG tablet Take 1,000 mg by mouth every 8 (eight) hours as needed for headache.    . Calcium 600-200 MG-UNIT per tablet Take 1 tablet by mouth 2 (two) times daily. 60 tablet 3  . calcium carbonate (OS-CAL) 600 MG TABS tablet Take 600 mg by mouth 2 (two) times daily with a meal.    . Cholecalciferol (VITAMIN D3) 5000 UNITS CAPS Take 1 capsule (5,000 Units total) by mouth daily. 30 capsule 3  . clonazePAM (KLONOPIN) 0.5 MG tablet Take 1 tablet (0.5 mg total) by mouth 3 (three) times daily as needed for anxiety. 90 tablet 2  . ondansetron (ZOFRAN) 4 MG tablet Take 1 tablet (4 mg total) by mouth every 8 (eight) hours as needed for nausea or vomiting. 20 tablet 1  . oxycodone (OXY-IR) 5 MG capsule Take 1 -2 tablets every 4 hours by mouth as needed 60 capsule 0  . OxyCODONE (OXYCONTIN) 20 mg T12A 12 hr tablet Take 1  tablet (20 mg total) by mouth every 12 (twelve) hours. 60 tablet 0  . pantoprazole (PROTONIX) 40 MG tablet Take 1 tablet (40 mg total) by mouth daily. 30 tablet 6  . phosphorus (PHOSPHA 250 NEUTRAL) 155-852-130 MG tablet Take 1 tablet (250 mg total) by mouth 2 (two) times daily. 60 tablet 2  . potassium chloride (K-DUR) 10 MEQ tablet Take 1 tablet (10 mEq total) by mouth daily. 30 tablet 2  . furosemide (LASIX) 20 MG tablet 1 tab by mouth every other day as needed for edema (Patient not taking: Reported on 10/08/2014) 30 tablet 2   No current facility-administered medications for this visit.    Review of Systems:  GENERAL:  Feels good.  No fevers, sweats or weight loss. PERFORMANCE STATUS (ECOG):  0 HEENT:  Drippy nose.  No visual changes, runny nose, sore throat, mouth sores or tenderness. Lungs: No shortness of breath  or cough.  No hemoptysis. Cardiac:  Transient chest discomfort.  No chest pain, palpitations, orthopnea, or PND. GI:  Slight nausea associated with treatment.  No vomiting, diarrhea, constipation, melena or hematochezia. GU:  No urgency, frequency, dysuria, or hematuria. Musculoskeletal:  No back pain.  No joint pain.  No muscle tenderness. Extremities:  No pain or swelling. Skin:  No rashes or skin changes. Neuro:  No headache, numbness or weakness, balance or coordination issues. Endocrine:  No diabetes, thyroid issues, hot flashes or night sweats. Psych:  Anxiety/stress.  No mood changes or depression. Pain:  No focal pain. Review of systems:  All other systems reviewed and found to be negative.   Physical Exam: Blood pressure 163/90, pulse 73, temperature 97.4 F (36.3 C), temperature source Tympanic, resp. rate 18, height _0  (1.651 m), weight 120 lb 14.8 oz (54.85 kg). GENERAL:  Well developed, well nourished, sitting comfortably in the exam room in no acute distress. MENTAL STATUS:  Alert and oriented to person, place and time. HEAD:  Short styled gray hair.  Normocephalic, atraumatic, face symmetric, no Cushingoid features. EYES: Hazel eyes.  Pupils equal round and reactive to light and accomodation.  No conjunctivitis or scleral icterus. ENT:  Oropharynx clear without lesion.  Tongue normal. Mucous membranes moist.  RESPIRATORY:  Clear to auscultation without rales, wheezes or rhonchi. CARDIOVASCULAR:  Regular rate and rhythm without murmur, rub or gallop. ABDOMEN:  Soft, non-tender, with active bowel sounds, and no hepatosplenomegaly.  No masses. SKIN:  No rashes, ulcers or lesions. EXTREMITIES: No edema, no skin discoloration or tenderness.  No palpable cords. LYMPH NODES: No palpable cervical, supraclavicular, axillary or inguinal adenopathy  NEUROLOGICAL: Unremarkable. PSYCH:  Appropriate.  NEUROLOGICAL: Unremarkable.  Appointment on 10/08/2014  Component Date Value Ref  Range Status  . WBC 10/08/2014 7.7  3.6 - 11.0 K/uL Final   A-LINE DRAW  . RBC 10/08/2014 3.84  3.80 - 5.20 MIL/uL Final  . Hemoglobin 10/08/2014 12.6  12.0 - 16.0 g/dL Final  . HCT 10/08/2014 37.0  35.0 - 47.0 % Final  . MCV 10/08/2014 96.3  80.0 - 100.0 fL Final  . MCH 10/08/2014 32.7  26.0 - 34.0 pg Final  . MCHC 10/08/2014 34.0  32.0 - 36.0 g/dL Final  . RDW 10/08/2014 14.5  11.5 - 14.5 % Final  . Platelets 10/08/2014 206  150 - 440 K/uL Final  . Neutrophils Relative % 10/08/2014 85   Final  . Neutro Abs 10/08/2014 6.5  1.4 - 6.5 K/uL Final  . Lymphocytes Relative 10/08/2014 10   Final  . Lymphs  Abs 10/08/2014 0.7* 1.0 - 3.6 K/uL Final  . Monocytes Relative 10/08/2014 5   Final  . Monocytes Absolute 10/08/2014 0.4  0.2 - 0.9 K/uL Final  . Eosinophils Relative 10/08/2014 1   Final  . Eosinophils Absolute 10/08/2014 0.1  0 - 0.7 K/uL Final  . Basophils Relative 10/08/2014 1   Final  . Basophils Absolute 10/08/2014 0.0  0 - 0.1 K/uL Final  . Sodium 10/08/2014 136  135 - 145 mmol/L Final  . Potassium 10/08/2014 4.1  3.5 - 5.1 mmol/L Final  . Chloride 10/08/2014 105  101 - 111 mmol/L Final  . CO2 10/08/2014 25  22 - 32 mmol/L Final  . Glucose, Bld 10/08/2014 89  65 - 99 mg/dL Final  . BUN 10/08/2014 16  6 - 20 mg/dL Final  . Creatinine, Ser 10/08/2014 0.70  0.44 - 1.00 mg/dL Final  . Calcium 10/08/2014 8.0* 8.9 - 10.3 mg/dL Final  . Total Protein 10/08/2014 7.2  6.5 - 8.1 g/dL Final  . Albumin 10/08/2014 3.8  3.5 - 5.0 g/dL Final  . AST 10/08/2014 21  15 - 41 U/L Final  . ALT 10/08/2014 13* 14 - 54 U/L Final  . Alkaline Phosphatase 10/08/2014 61  38 - 126 U/L Final  . Total Bilirubin 10/08/2014 0.5  0.3 - 1.2 mg/dL Final  . GFR calc non Af Amer 10/08/2014 >60  >60 mL/min Final  . GFR calc Af Amer 10/08/2014 >60  >60 mL/min Final   Comment: (NOTE) The eGFR has been calculated using the CKD EPI equation. This calculation has not been validated in all clinical situations. eGFR's  persistently <60 mL/min signify possible Chronic Kidney Disease.   . Anion gap 10/08/2014 6  5 - 15 Final  . Magnesium 10/08/2014 2.0  1.7 - 2.4 mg/dL Final    Assessment:  Elizabeth Bond is a 64 y.o. female with metastatic Her2/neu positive right breast cancer.  She initially presented in 03/2013 with an ulcerated breast mass and back pain.  Breast biopsy revealed lobular breast cancer.  Tumor was ER/PR positive and HER-2/neu positive. PET scan revealed bone metastasis. She received palliative radiation to T8 and T10 from 04/02-04/15/2015.  She declined chemotherapy. She initially began letrozole and Tykerb. Tykerb was discontinued after 1 dose. She received letrozole from 05/02/2013 until 01/07/2014 .  She received Herceptin from 06/02/2013 until 12/15/2013. She has received fulvestrant monthly from 01/07/2014 until 05/27/2014.  She has received Xgeva monthly (03/31/2013 until 07/27/2014).  Notes indicate Herceptin was discontinued for a rising CA-27-29.  CA27.29 was 916.3 on 03/19/2013, 286.2 on 06/23/2013, 70.8 on 08/25/2013, 56.3 on 10/08/2013, 102.7 on 12/15/2013, 149.4 on 01/07/2014, 178.0 on 02/04/2014, 190.7 on 03/30/2014, 143.2 on 04/22/2014, 134.8 on 05/20/2014, 248.3 on 07/09/2014, 723.2 on 08/25/2014, and 252.3 on 10/08/2014.   She received Ibrance and Faslodex from 02/23/2014 until 07/27/2014.  Echo on 09/04/2014 revealed an EF of 55-65%  PET scan on 08/28/2014 revealed significant interval worsening and multifocal osseous metastatic disease. Lesions had increased in number and metabolic activity. There were no extra osseous metastasis.  She is currently s/p cycle #1 Kadcyla (began 09/10/2014). She has had some mild nausea.  She has had some unexplained of chest pain and light headedness (also happened prior to treatment).  Exam is stable.  Plan: 1.  Labs today:  CBC with diff, CMP, Mg. 2.  Cycle #2 Kadcyla today. 3.  Discuss plan for ER evaluation if chest pain, shortness of breath  or dizziness recurs. 4.  RTC  in 3 weeks for MD assessment, labs (CBC with diff, CMP, Mg, CA27.29), and cycle #3 Kadcyla (TDM-1; trastuzumab emtansine).    Lequita Asal, MD  10/08/2014, 10:54 AM

## 2014-10-08 NOTE — Telephone Encounter (Signed)
Called pt per MD to ask pt to increase calcium to 600mg  BID.  According to pt she is already taking that amount.  Spoke with MD again and per MD increase to 600mg  TID.  I called and spoke with pt again and explained the increase in Calcium.  Pt verbalized an understanding and stated I can do that.

## 2014-10-08 NOTE — Progress Notes (Signed)
Pt reports having a pain in your chest/upper abdominal area undesirable type pain feels sore.  Reports being Dizzy, sob, and shaking when the pain occurs.  Pt beleives its related to stress possibly.  Happens every once in a while.  Stretches out in recliner for about 5 minutes comes and goes.  Pt refuses to go to ER at times.  Does not feel its was serious.

## 2014-10-09 LAB — CANCER ANTIGEN 27.29: CA 27.29: 252.3 U/mL — ABNORMAL HIGH (ref 0.0–38.6)

## 2014-10-12 ENCOUNTER — Telehealth: Payer: Self-pay | Admitting: *Deleted

## 2014-10-12 DIAGNOSIS — C50919 Malignant neoplasm of unspecified site of unspecified female breast: Secondary | ICD-10-CM

## 2014-10-12 MED ORDER — POTASSIUM CHLORIDE ER 10 MEQ PO TBCR: 10.0000 meq | EXTENDED_RELEASE_TABLET | Freq: Every day | ORAL | Status: DC

## 2014-10-12 MED ORDER — PANTOPRAZOLE SODIUM 40 MG PO TBEC
40.0000 mg | DELAYED_RELEASE_TABLET | Freq: Every day | ORAL | Status: DC
Start: 2014-10-12 — End: 2015-03-12

## 2014-10-12 MED ORDER — CLONAZEPAM 0.5 MG PO TABS: 0.5000 mg | ORAL_TABLET | Freq: Three times a day (TID) | ORAL | Status: DC | PRN

## 2014-10-12 MED ORDER — CALCIUM 600-200 MG-UNIT PO TABS: 1.0000 | ORAL_TABLET | Freq: Two times a day (BID) | ORAL | Status: DC

## 2014-10-12 MED ORDER — K PHOS MONO-SOD PHOS DI & MONO 155-852-130 MG PO TABS: 250.0000 mg | ORAL_TABLET | Freq: Two times a day (BID) | ORAL | Status: DC

## 2014-10-12 NOTE — Telephone Encounter (Signed)
Escribed

## 2014-10-19 ENCOUNTER — Other Ambulatory Visit: Payer: Self-pay | Admitting: *Deleted

## 2014-10-19 DIAGNOSIS — C50919 Malignant neoplasm of unspecified site of unspecified female breast: Secondary | ICD-10-CM

## 2014-10-19 MED ORDER — OXYCODONE HCL ER 20 MG PO T12A: 20.0000 mg | EXTENDED_RELEASE_TABLET | Freq: Two times a day (BID) | ORAL | Status: DC

## 2014-10-29 ENCOUNTER — Inpatient Hospital Stay (HOSPITAL_BASED_OUTPATIENT_CLINIC_OR_DEPARTMENT_OTHER): Payer: Medicare Other | Admitting: Hematology and Oncology

## 2014-10-29 ENCOUNTER — Telehealth: Payer: Self-pay

## 2014-10-29 ENCOUNTER — Inpatient Hospital Stay: Payer: Medicare Other

## 2014-10-29 VITALS — BP 169/95 | HR 79 | Temp 96.7°F | Resp 18 | Ht 65.0 in | Wt 123.5 lb

## 2014-10-29 DIAGNOSIS — C50911 Malignant neoplasm of unspecified site of right female breast: Secondary | ICD-10-CM

## 2014-10-29 DIAGNOSIS — Z79899 Other long term (current) drug therapy: Secondary | ICD-10-CM

## 2014-10-29 DIAGNOSIS — R079 Chest pain, unspecified: Secondary | ICD-10-CM | POA: Diagnosis not present

## 2014-10-29 DIAGNOSIS — C7951 Secondary malignant neoplasm of bone: Secondary | ICD-10-CM

## 2014-10-29 DIAGNOSIS — R0602 Shortness of breath: Secondary | ICD-10-CM | POA: Diagnosis not present

## 2014-10-29 DIAGNOSIS — Z170 Estrogen receptor positive status [ER+]: Secondary | ICD-10-CM

## 2014-10-29 DIAGNOSIS — C50919 Malignant neoplasm of unspecified site of unspecified female breast: Secondary | ICD-10-CM

## 2014-10-29 DIAGNOSIS — R42 Dizziness and giddiness: Secondary | ICD-10-CM

## 2014-10-29 DIAGNOSIS — Z5112 Encounter for antineoplastic immunotherapy: Secondary | ICD-10-CM | POA: Diagnosis not present

## 2014-10-29 LAB — CBC WITH DIFFERENTIAL/PLATELET
Basophils Absolute: 0 10*3/uL (ref 0–0.1)
Basophils Relative: 1 %
Eosinophils Absolute: 0 10*3/uL (ref 0–0.7)
Eosinophils Relative: 1 %
HCT: 35.6 % (ref 35.0–47.0)
Hemoglobin: 11.9 g/dL — ABNORMAL LOW (ref 12.0–16.0)
Lymphocytes Relative: 10 %
Lymphs Abs: 0.7 10*3/uL — ABNORMAL LOW (ref 1.0–3.6)
MCH: 31.5 pg (ref 26.0–34.0)
MCHC: 33.5 g/dL (ref 32.0–36.0)
MCV: 94.1 fL (ref 80.0–100.0)
Monocytes Absolute: 0.4 10*3/uL (ref 0.2–0.9)
Monocytes Relative: 6 %
Neutro Abs: 6.1 10*3/uL (ref 1.4–6.5)
Neutrophils Relative %: 82 %
Platelets: 239 10*3/uL (ref 150–440)
RBC: 3.78 MIL/uL — ABNORMAL LOW (ref 3.80–5.20)
RDW: 14.1 % (ref 11.5–14.5)
WBC: 7.3 10*3/uL (ref 4.0–10.5)

## 2014-10-29 LAB — COMPREHENSIVE METABOLIC PANEL
ALT: 14 U/L (ref 14–54)
AST: 23 U/L (ref 15–41)
Albumin: 3.8 g/dL (ref 3.5–5.0)
Alkaline Phosphatase: 62 U/L (ref 38–126)
Anion gap: 7 (ref 5–15)
BUN: 20 mg/dL (ref 6–20)
CO2: 29 mmol/L (ref 22–32)
Calcium: 8.2 mg/dL — ABNORMAL LOW (ref 8.9–10.3)
Chloride: 101 mmol/L (ref 101–111)
Creatinine, Ser: 0.79 mg/dL (ref 0.44–1.00)
GFR calc Af Amer: >60 mL/min (ref >60)
GFR calc non Af Amer: >60 mL/min (ref >60)
Glucose, Bld: 99 mg/dL (ref 65–99)
Potassium: 4.1 mmol/L (ref 3.5–5.1)
Sodium: 137 mmol/L (ref 135–145)
Total Bilirubin: 0.5 mg/dL (ref 0.3–1.2)
Total Protein: 7.4 g/dL (ref 6.5–8.1)

## 2014-10-29 LAB — MAGNESIUM: Magnesium: 1.9 mg/dL (ref 1.7–2.4)

## 2014-10-29 MED ORDER — SODIUM CHLORIDE 0.9 % IV SOLN
Freq: Once | INTRAVENOUS | Status: AC
  Administered 2014-10-29: 11:00:00 via INTRAVENOUS
  Filled 2014-10-29: qty 1000

## 2014-10-29 MED ORDER — HEPARIN SOD (PORK) LOCK FLUSH 100 UNIT/ML IV SOLN
500.0000 [IU] | Freq: Once | INTRAVENOUS | Status: AC
  Administered 2014-10-29: 500 [IU] via INTRAVENOUS
  Filled 2014-10-29: qty 5

## 2014-10-29 MED ORDER — SODIUM CHLORIDE 0.9 % IJ SOLN
10.0000 mL | INTRAMUSCULAR | Status: DC | PRN
  Administered 2014-10-29: 10 mL via INTRAVENOUS
  Filled 2014-10-29: qty 10

## 2014-10-29 MED ORDER — ACETAMINOPHEN 325 MG PO TABS
650.0000 mg | ORAL_TABLET | Freq: Once | ORAL | Status: AC
  Administered 2014-10-29: 650 mg via ORAL
  Filled 2014-10-29: qty 2

## 2014-10-29 MED ORDER — SODIUM CHLORIDE 0.9 % IV SOLN
3.6000 mg/kg | Freq: Once | INTRAVENOUS | Status: AC
  Administered 2014-10-29: 200 mg via INTRAVENOUS
  Filled 2014-10-29: qty 10

## 2014-10-29 MED ORDER — DIPHENHYDRAMINE HCL 25 MG PO CAPS
50.0000 mg | ORAL_CAPSULE | Freq: Once | ORAL | Status: AC
  Administered 2014-10-29: 50 mg via ORAL
  Filled 2014-10-29: qty 2

## 2014-10-29 NOTE — Telephone Encounter (Signed)
Called pt per MD to confirm calcium dosage.  Pt is currently taking Spring vitamin calcium 600mg  TID.  MD aware

## 2014-10-29 NOTE — Progress Notes (Signed)
Patient is here for follow-up of breast cancer and kadcyla treatment. Patient states that she has been doing well and has no complaints today.

## 2014-10-29 NOTE — Progress Notes (Signed)
[facility] day:  10/29/2014   Chief Complaint: Elizabeth Bond is a 64 y.o. female with metastatic Her2/neu positive breast cancer who is seen for assessment prior to cycle #3 Kadcyla.  HPI:  The patient was last seen in the medical oncology clinic on 10/08/2014.  At that time, she was seen for assessment prior to cycle #2.  She was doing well.  She had a couple of episodes of unexplained chest/epigastric pain and light headedness.  She denied any symptoms on the day of treatment.  It was recommended that she she seek medical assistance if one of these episodes recurred again.  During the interim, she has done well. She notes no symptoms.  Past Medical History  Diagnosis Date  . IBS (irritable bowel syndrome)   . Cancer 2015    breast and bone  . Breast cancer     right  . 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 Replacement total knee Left 2004-2005?  Marland Kitchen Abdominal hysterectomy  1978  . Portacath placement  2015    Family History  Problem Relation Age of Onset  . Cancer Father     prostate  . Cancer Maternal Aunt     breast  . Cancer Cousin     breast  . Cancer Other     lung cancer - neice  . Cancer - Other Daughter     brain  . Cancer Mother     Pancreatic    Social History:  reports that she quit smoking about 18 months ago. Her smoking use included Cigarettes. She has a 10 pack-year smoking history. She has never used smokeless tobacco. She reports that she drinks alcohol. She reports that she does not use illicit drugs.  The patient is accompanied by her sister, Mariann Laster.  Allergies:  Allergies  Allergen Reactions  . Fentanyl Other (See Comments)    "loopy and confused"    Current Medications: Current Outpatient Prescriptions  Medication Sig Dispense Refill  . acetaminophen (TYLENOL) 500 MG tablet Take 1,000 mg by mouth every 8 (eight) hours as needed for headache.    .  Calcium 600-200 MG-UNIT tablet Take 1 tablet by mouth 2 (two) times daily. 60 tablet 3  . calcium carbonate (OS-CAL) 600 MG TABS tablet Take 600 mg by mouth 2 (two) times daily with a meal.    . Cholecalciferol (VITAMIN D3) 5000 UNITS CAPS Take 1 capsule (5,000 Units total) by mouth daily. 30 capsule 3  . clonazePAM (KLONOPIN) 0.5 MG tablet Take 1 tablet (0.5 mg total) by mouth 3 (three) times daily as needed for anxiety. 90 tablet 2  . furosemide (LASIX) 20 MG tablet 1 tab by mouth every other day as needed for edema (Patient not taking: Reported on 10/08/2014) 30 tablet 2  . ondansetron (ZOFRAN) 4 MG tablet Take 1 tablet (4 mg total) by mouth every 8 (eight) hours as needed for nausea or vomiting. 20 tablet 1  . oxycodone (OXY-IR) 5 MG capsule Take 1 -2 tablets every 4 hours by mouth as needed 60 capsule 0  . OxyCODONE (OXYCONTIN) 20 mg T12A 12 hr tablet Take 1 tablet (20 mg total) by mouth every 12 (twelve) hours. 60 tablet 0  . pantoprazole (PROTONIX) 40 MG tablet Take 1 tablet (40 mg total) by mouth daily. 30 tablet 6  . phosphorus (PHOSPHA 250 NEUTRAL) 155-852-130 MG tablet Take 1 tablet (250 mg total) by  mouth 2 (two) times daily. 60 tablet 2  . potassium chloride (K-DUR) 10 MEQ tablet Take 1 tablet (10 mEq total) by mouth daily. 30 tablet 2   No current facility-administered medications for this visit.   Facility-Administered Medications Ordered in Other Visits  Medication Dose Route Frequency Provider Last Rate Last Dose  . sodium chloride 0.9 % injection 10 mL  10 mL Intravenous PRN Lequita Asal, MD   10 mL at 10/29/14 0902    Review of Systems:  GENERAL:  Feels good.  No fevers or sweats.  Weight up 3 pounds. PERFORMANCE STATUS (ECOG):  0 HEENT:  No visual changes, runny nose, sore throat, mouth sores or tenderness. Lungs: No shortness of breath or cough.  No hemoptysis. Cardiac:  No chest pain, palpitations, orthopnea, or PND. GI:  No nausea, vomiting, diarrhea,  constipation, melena or hematochezia. GU:  No urgency, frequency, dysuria, or hematuria. Musculoskeletal:  No back pain.  No joint pain.  No muscle tenderness. Extremities:  No pain or swelling. Skin:  No rashes or skin changes. Neuro:  No headache, numbness or weakness, balance or coordination issues. Endocrine:  No diabetes, thyroid issues, hot flashes or night sweats. Psych:  No mood changes, depression or anxiety. Pain:  No focal pain. Review of systems:  All other systems reviewed and found to be negative.   Physical Exam: Blood pressure 169/95, pulse 79, temperature 96.7 F (35.9 C), temperature source Tympanic, resp. rate 18, height _0  (1.651 m), weight 123 lb 7.3 oz (56 kg). GENERAL:  Well developed, well nourished, sitting comfortably in the exam room in no acute distress. MENTAL STATUS:  Alert and oriented to person, place and time. HEAD:  Short styled gray hair.  Normocephalic, atraumatic, face symmetric, no Cushingoid features. EYES: Hazel eyes.  Pupils equal round and reactive to light and accomodation.  No conjunctivitis or scleral icterus. ENT:  Oropharynx clear without lesion.  Tongue normal. Mucous membranes moist.  RESPIRATORY:  Clear to auscultation without rales, wheezes or rhonchi. CARDIOVASCULAR:  Regular rate and rhythm without murmur, rub or gallop.  No JVD. ABDOMEN:  Soft, non-tender, with active bowel sounds, and no hepatosplenomegaly.  No masses. SKIN:  No rashes, ulcers or lesions. EXTREMITIES: No edema, no skin discoloration or tenderness.  No palpable cords. LYMPH NODES: No palpable cervical, supraclavicular, axillary or inguinal adenopathy  NEUROLOGICAL: Unremarkable. PSYCH:  Appropriate.  NEUROLOGICAL: Unremarkable.  Infusion on 10/29/2014  Component Date Value Ref Range Status  . WBC 10/29/2014 7.3  4.0 - 10.5 K/uL Final   A-LINE DRAW  . RBC 10/29/2014 3.78* 3.80 - 5.20 MIL/uL Final  . Hemoglobin 10/29/2014 11.9* 12.0 - 16.0 g/dL Final  . HCT  10/29/2014 35.6  35.0 - 47.0 % Final  . MCV 10/29/2014 94.1  80.0 - 100.0 fL Final  . MCH 10/29/2014 31.5  26.0 - 34.0 pg Final  . MCHC 10/29/2014 33.5  32.0 - 36.0 g/dL Final  . RDW 10/29/2014 14.1  11.5 - 14.5 % Final  . Platelets 10/29/2014 239  150 - 440 K/uL Final  . Neutrophils Relative % 10/29/2014 82   Final  . Neutro Abs 10/29/2014 6.1  1.4 - 6.5 K/uL Final  . Lymphocytes Relative 10/29/2014 10   Final  . Lymphs Abs 10/29/2014 0.7* 1.0 - 3.6 K/uL Final  . Monocytes Relative 10/29/2014 6   Final  . Monocytes Absolute 10/29/2014 0.4  0.2 - 0.9 K/uL Final  . Eosinophils Relative 10/29/2014 1   Final  . Eosinophils  Absolute 10/29/2014 0.0  0 - 0.7 K/uL Final  . Basophils Relative 10/29/2014 1   Final  . Basophils Absolute 10/29/2014 0.0  0 - 0.1 K/uL Final  . Sodium 10/29/2014 137  135 - 145 mmol/L Final  . Potassium 10/29/2014 4.1  3.5 - 5.1 mmol/L Final  . Chloride 10/29/2014 101  101 - 111 mmol/L Final  . CO2 10/29/2014 29  22 - 32 mmol/L Final  . Glucose, Bld 10/29/2014 99  65 - 99 mg/dL Final  . BUN 10/29/2014 20  6 - 20 mg/dL Final  . Creatinine, Ser 10/29/2014 0.79  0.44 - 1.00 mg/dL Final  . Calcium 10/29/2014 8.2* 8.9 - 10.3 mg/dL Final  . Total Protein 10/29/2014 7.4  6.5 - 8.1 g/dL Final  . Albumin 10/29/2014 3.8  3.5 - 5.0 g/dL Final  . AST 10/29/2014 23  15 - 41 U/L Final  . ALT 10/29/2014 14  14 - 54 U/L Final  . Alkaline Phosphatase 10/29/2014 62  38 - 126 U/L Final  . Total Bilirubin 10/29/2014 0.5  0.3 - 1.2 mg/dL Final  . GFR calc non Af Amer 10/29/2014 >60  >60 mL/min Final  . GFR calc Af Amer 10/29/2014 >60  >60 mL/min Final   Comment: (NOTE) The eGFR has been calculated using the CKD EPI equation. This calculation has not been validated in all clinical situations. eGFR's persistently <60 mL/min signify possible Chronic Kidney Disease.   . Anion gap 10/29/2014 7  5 - 15 Final  . Magnesium 10/29/2014 1.9  1.7 - 2.4 mg/dL Final    Assessment:  TOSHI ISHII is a 64 y.o. female with metastatic Her2/neu positive right breast cancer.  She initially presented in 03/2013 with an ulcerated breast mass and back pain.  Breast biopsy revealed lobular breast cancer.  Tumor was ER/PR positive and HER-2/neu positive. PET scan revealed bone metastasis. She received palliative radiation to T8 and T10 from 04/02-04/15/2015.  She declined chemotherapy. She initially began letrozole and Tykerb. Tykerb was discontinued after 1 dose. She received letrozole from 05/02/2013 until 01/07/2014 .  She received Herceptin from 06/02/2013 until 12/15/2013. She has received fulvestrant monthly from 01/07/2014 until 05/27/2014.  She has received Xgeva monthly (03/31/2013 until 07/27/2014).  Notes indicate Herceptin was discontinued for a rising CA-27-29.  CA27.29 was 916.3 on 03/19/2013, 286.2 on 06/23/2013, 70.8 on 08/25/2013, 56.3 on 10/08/2013, 102.7 on 12/15/2013, 149.4 on 01/07/2014, 178.0 on 02/04/2014, 190.7 on 03/30/2014, 143.2 on 04/22/2014, 134.8 on 05/20/2014, 248.3 on 07/09/2014, 723.2 on 08/25/2014, 252.3 on 10/08/2014, and 126 on 10/29/2014.   She received Ibrance and Faslodex from 02/23/2014 until 07/27/2014.  Echo on 09/04/2014 revealed an EF of 55-65%  PET scan on 08/28/2014 revealed significant interval worsening and multifocal osseous metastatic disease. Lesions had increased in number and metabolic activity. There were no extra osseous metastasis.  She is currently s/p 2 cycles of Kadcyla (09/10/2014 - 10/08/2014).  Cycle #1 was complicated by transient shortness of breath and dizziness unrelated to exertion.  Symptomatically, she is doing well.  Exam is normal.  Plan: 1.  Labs today:  CBC with diff, CMP, C27.29. 2.  Cycle #3 Kadcyla. 3.  RTC in 3 weeks for MD assessment, labs (CBC with diff, CMP, Mg, CA27.29), and cycle #4 Kadcyla (TDM-1; trastuzumab emtansine).    Lequita Asal, MD  10/29/2014, 2:18 PM

## 2014-10-30 LAB — CANCER ANTIGEN 27.29: CA 27.29: 126 U/mL — ABNORMAL HIGH (ref 0.0–38.6)

## 2014-11-12 ENCOUNTER — Telehealth: Payer: Self-pay

## 2014-11-12 NOTE — Telephone Encounter (Signed)
Returned pt's phone regarding sore throat, and slightly stiff neck that pt reported as possibly sleeping wrong.  Per MD pt can go in and see PCP.  Pt reports she is going to  gargle salt water, drinking juice and take tylenol.

## 2014-11-19 ENCOUNTER — Other Ambulatory Visit: Payer: No Typology Code available for payment source

## 2014-11-19 ENCOUNTER — Ambulatory Visit: Payer: No Typology Code available for payment source | Admitting: Hematology and Oncology

## 2014-11-24 ENCOUNTER — Inpatient Hospital Stay: Payer: Medicare Other

## 2014-11-24 ENCOUNTER — Inpatient Hospital Stay (HOSPITAL_BASED_OUTPATIENT_CLINIC_OR_DEPARTMENT_OTHER): Payer: Medicare Other | Admitting: Hematology and Oncology

## 2014-11-24 ENCOUNTER — Inpatient Hospital Stay: Payer: Medicare Other | Attending: Hematology and Oncology

## 2014-11-24 ENCOUNTER — Other Ambulatory Visit: Payer: Self-pay

## 2014-11-24 VITALS — BP 133/89 | HR 102 | Temp 97.2°F | Resp 18 | Ht 65.0 in | Wt 117.7 lb

## 2014-11-24 DIAGNOSIS — C50911 Malignant neoplasm of unspecified site of right female breast: Secondary | ICD-10-CM

## 2014-11-24 DIAGNOSIS — Z5111 Encounter for antineoplastic chemotherapy: Secondary | ICD-10-CM | POA: Diagnosis not present

## 2014-11-24 DIAGNOSIS — C7951 Secondary malignant neoplasm of bone: Secondary | ICD-10-CM

## 2014-11-24 DIAGNOSIS — Z87891 Personal history of nicotine dependence: Secondary | ICD-10-CM

## 2014-11-24 DIAGNOSIS — Z79899 Other long term (current) drug therapy: Secondary | ICD-10-CM

## 2014-11-24 DIAGNOSIS — Z170 Estrogen receptor positive status [ER+]: Secondary | ICD-10-CM | POA: Insufficient documentation

## 2014-11-24 DIAGNOSIS — C50919 Malignant neoplasm of unspecified site of unspecified female breast: Secondary | ICD-10-CM

## 2014-11-24 LAB — CBC WITH DIFFERENTIAL/PLATELET
Basophils Absolute: 0 10*3/uL (ref 0–0.1)
Basophils Relative: 0 %
Eosinophils Absolute: 0 10*3/uL (ref 0–0.7)
Eosinophils Relative: 1 %
HCT: 37.7 % (ref 35.0–47.0)
Hemoglobin: 12.6 g/dL (ref 12.0–16.0)
Lymphocytes Relative: 8 %
Lymphs Abs: 0.6 10*3/uL — ABNORMAL LOW (ref 1.0–3.6)
MCH: 30.9 pg (ref 26.0–34.0)
MCHC: 33.3 g/dL (ref 32.0–36.0)
MCV: 92.9 fL (ref 80.0–100.0)
Monocytes Absolute: 0.5 10*3/uL (ref 0.2–0.9)
Monocytes Relative: 6 %
Neutro Abs: 7.2 10*3/uL — ABNORMAL HIGH (ref 1.4–6.5)
Neutrophils Relative %: 85 %
Platelets: 285 10*3/uL (ref 150–440)
RBC: 4.06 MIL/uL (ref 3.80–5.20)
RDW: 14.1 % (ref 11.5–14.5)
WBC: 8.4 10*3/uL (ref 3.6–11.0)

## 2014-11-24 LAB — COMPREHENSIVE METABOLIC PANEL
ALT: 12 U/L — ABNORMAL LOW (ref 14–54)
AST: 28 U/L (ref 15–41)
Albumin: 3.8 g/dL (ref 3.5–5.0)
Alkaline Phosphatase: 57 U/L (ref 38–126)
Anion gap: 8 (ref 5–15)
BUN: 19 mg/dL (ref 6–20)
CO2: 30 mmol/L (ref 22–32)
Calcium: 9.4 mg/dL (ref 8.9–10.3)
Chloride: 101 mmol/L (ref 101–111)
Creatinine, Ser: 0.85 mg/dL (ref 0.44–1.00)
GFR calc Af Amer: >60 mL/min (ref >60)
GFR calc non Af Amer: >60 mL/min (ref >60)
Glucose, Bld: 95 mg/dL (ref 65–99)
Potassium: 4.2 mmol/L (ref 3.5–5.1)
Sodium: 139 mmol/L (ref 135–145)
Total Bilirubin: 0.2 mg/dL — ABNORMAL LOW (ref 0.3–1.2)
Total Protein: 9.1 g/dL — ABNORMAL HIGH (ref 6.5–8.1)

## 2014-11-24 LAB — MAGNESIUM: Magnesium: 1.9 mg/dL (ref 1.7–2.4)

## 2014-11-24 MED ORDER — HEPARIN SOD (PORK) LOCK FLUSH 100 UNIT/ML IV SOLN
500.0000 [IU] | Freq: Once | INTRAVENOUS | Status: AC | PRN
  Administered 2014-11-24: 500 [IU]
  Filled 2014-11-24: qty 5

## 2014-11-24 MED ORDER — SODIUM CHLORIDE 0.9 % IV SOLN
3.6000 mg/kg | Freq: Once | INTRAVENOUS | Status: AC
  Administered 2014-11-24: 200 mg via INTRAVENOUS
  Filled 2014-11-24: qty 10

## 2014-11-24 MED ORDER — DIPHENHYDRAMINE HCL 25 MG PO CAPS
50.0000 mg | ORAL_CAPSULE | Freq: Once | ORAL | Status: AC
  Administered 2014-11-24: 50 mg via ORAL
  Filled 2014-11-24: qty 2

## 2014-11-24 MED ORDER — SODIUM CHLORIDE 0.9 % IJ SOLN
10.0000 mL | INTRAMUSCULAR | Status: DC | PRN
  Administered 2014-11-24: 10 mL
  Filled 2014-11-24: qty 10

## 2014-11-24 MED ORDER — CALCIUM CARBONATE 600 MG PO TABS: 600.0000 mg | ORAL_TABLET | Freq: Two times a day (BID) | ORAL | Status: DC

## 2014-11-24 MED ORDER — OXYCODONE HCL ER 20 MG PO T12A
20.0000 mg | EXTENDED_RELEASE_TABLET | Freq: Two times a day (BID) | ORAL | Status: DC
Start: 2014-11-24 — End: 2014-12-18

## 2014-11-24 MED ORDER — SODIUM CHLORIDE 0.9 % IV SOLN
Freq: Once | INTRAVENOUS | Status: AC
  Administered 2014-11-24: 15:00:00 via INTRAVENOUS
  Filled 2014-11-24: qty 1000

## 2014-11-24 MED ORDER — ACETAMINOPHEN 325 MG PO TABS
650.0000 mg | ORAL_TABLET | Freq: Once | ORAL | Status: AC
  Administered 2014-11-24: 650 mg via ORAL
  Filled 2014-11-24: qty 2

## 2014-11-24 MED ORDER — FUROSEMIDE 20 MG PO TABS: ORAL_TABLET | ORAL | Status: DC

## 2014-11-24 NOTE — Progress Notes (Signed)
Patient is here for follow-up. She states that she has been doing well and offers no complaints.

## 2014-11-24 NOTE — Progress Notes (Signed)
[facility] day:  11/24/2014   Chief Complaint: Elizabeth Bond is a 64 y.o. female with metastatic Her2/neu positive breast cancer who is seen for assessment prior to cycle #4 Kadcyla.  HPI:  The patient was last seen in the medical oncology clinic on 10/29/2014.  At that time, she received cycle #3 Kadcyla.  Symptomatically she was doing well. She voiced no complaint. CA-27-29 had decreased from 252.3 on 10/08/2014 to 126.  During the interim, she has continued to do well.  She denies any symptoms.  Past Medical History  Diagnosis Date  . IBS (irritable bowel syndrome)   . Cancer 2015    breast and bone  . Breast cancer     right  . 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 Replacement total knee Left 2004-2005?  Marland Kitchen Abdominal hysterectomy  1978  . Portacath placement  2015    Family History  Problem Relation Age of Onset  . Cancer Father     prostate  . Cancer Maternal Aunt     breast  . Cancer Cousin     breast  . Cancer Other     lung cancer - neice  . Cancer - Other Daughter     brain  . Cancer Mother     Pancreatic    Social History:  reports that she quit smoking about 19 months ago. Her smoking use included Cigarettes. She has a 10 pack-year smoking history. She has never used smokeless tobacco. She reports that she drinks alcohol. She reports that she does not use illicit drugs.  The patient is alone today.  Allergies:  Allergies  Allergen Reactions  . Fentanyl Other (See Comments)    "loopy and confused"    Current Medications: Current Outpatient Prescriptions  Medication Sig Dispense Refill  . acetaminophen (TYLENOL) 500 MG tablet Take 1,000 mg by mouth every 8 (eight) hours as needed for headache.    . Calcium 600-200 MG-UNIT tablet Take 1 tablet by mouth 2 (two) times daily. 60 tablet 3  . calcium carbonate (OS-CAL) 600 MG TABS tablet Take 600 mg by mouth 2 (two)  times daily with a meal.    . clonazePAM (KLONOPIN) 0.5 MG tablet Take 1 tablet (0.5 mg total) by mouth 3 (three) times daily as needed for anxiety. 90 tablet 2  . furosemide (LASIX) 20 MG tablet 1 tab by mouth every other day as needed for edema 30 tablet 2  . ondansetron (ZOFRAN) 4 MG tablet Take 1 tablet (4 mg total) by mouth every 8 (eight) hours as needed for nausea or vomiting. 20 tablet 1  . oxycodone (OXY-IR) 5 MG capsule Take 1 -2 tablets every 4 hours by mouth as needed 60 capsule 0  . OxyCODONE (OXYCONTIN) 20 mg T12A 12 hr tablet Take 1 tablet (20 mg total) by mouth every 12 (twelve) hours. 60 tablet 0  . pantoprazole (PROTONIX) 40 MG tablet Take 1 tablet (40 mg total) by mouth daily. 30 tablet 6  . phosphorus (PHOSPHA 250 NEUTRAL) 155-852-130 MG tablet Take 1 tablet (250 mg total) by mouth 2 (two) times daily. 60 tablet 2  . potassium chloride (K-DUR) 10 MEQ tablet Take 1 tablet (10 mEq total) by mouth daily. 30 tablet 2   No current facility-administered medications for this visit.    Review of Systems:  GENERAL:  Feels good.  No fevers, sweats or weight loss. PERFORMANCE STATUS (  ECOG):  0 HEENT:  No visual changes, runny nose, sore throat, mouth sores or tenderness. Lungs: No shortness of breath or cough.  No hemoptysis. Cardiac:  No chest pain, palpitations, orthopnea, or PND. GI:  No nausea, vomiting, diarrhea, constipation, melena or hematochezia. GU:  No urgency, frequency, dysuria, or hematuria. Musculoskeletal:  No back pain.  No joint pain.  No muscle tenderness. Extremities:  No pain or swelling. Skin:  No rashes or skin changes. Neuro:  No headache, numbness or weakness, balance or coordination issues. Endocrine:  No diabetes, thyroid issues, hot flashes or night sweats. Psych:  No mood changes, depression or anxiety. Pain:  No focal pain. Review of systems:  All other systems reviewed and found to be negative.   Physical Exam: Blood pressure 133/89, pulse 102,  temperature 97.2 F (36.2 C), temperature source Tympanic, resp. rate 18, height $RemoveBe'5\' 5"'dInOAAoiB$  (1.651 m), weight 117 lb 11.6 oz (53.4 kg). GENERAL:  Well developed, well nourished, sitting comfortably in the exam room in no acute distress. MENTAL STATUS:  Alert and oriented to person, place and time. HEAD:  Short styled gray hair.  Normocephalic, atraumatic, face symmetric, no Cushingoid features. EYES: Hazel eyes.  Pupils equal round and reactive to light and accomodation.  No conjunctivitis or scleral icterus. ENT:  Oropharynx clear without lesion.  Tongue normal. Mucous membranes moist.  RESPIRATORY:  Clear to auscultation without rales, wheezes or rhonchi. CARDIOVASCULAR:  Regular rate and rhythm without murmur, rub or gallop.  No JVD. ABDOMEN:  Soft, non-tender, with active bowel sounds, and no hepatosplenomegaly.  No masses. SKIN:  No rashes, ulcers or lesions. EXTREMITIES: No edema, no skin discoloration or tenderness.  No palpable cords. LYMPH NODES: No palpable cervical, supraclavicular, axillary or inguinal adenopathy  NEUROLOGICAL: Unremarkable. PSYCH:  Appropriate.  NEUROLOGICAL: Unremarkable.  Appointment on 11/24/2014  Component Date Value Ref Range Status  . WBC 11/24/2014 8.4  3.6 - 11.0 K/uL Final  . RBC 11/24/2014 4.06  3.80 - 5.20 MIL/uL Final  . Hemoglobin 11/24/2014 12.6  12.0 - 16.0 g/dL Final  . HCT 11/24/2014 37.7  35.0 - 47.0 % Final  . MCV 11/24/2014 92.9  80.0 - 100.0 fL Final  . MCH 11/24/2014 30.9  26.0 - 34.0 pg Final  . MCHC 11/24/2014 33.3  32.0 - 36.0 g/dL Final  . RDW 11/24/2014 14.1  11.5 - 14.5 % Final  . Platelets 11/24/2014 285  150 - 440 K/uL Final  . Neutrophils Relative % 11/24/2014 85   Final  . Neutro Abs 11/24/2014 7.2* 1.4 - 6.5 K/uL Final  . Lymphocytes Relative 11/24/2014 8   Final  . Lymphs Abs 11/24/2014 0.6* 1.0 - 3.6 K/uL Final  . Monocytes Relative 11/24/2014 6   Final  . Monocytes Absolute 11/24/2014 0.5  0.2 - 0.9 K/uL Final  .  Eosinophils Relative 11/24/2014 1   Final  . Eosinophils Absolute 11/24/2014 0.0  0 - 0.7 K/uL Final  . Basophils Relative 11/24/2014 0   Final  . Basophils Absolute 11/24/2014 0.0  0 - 0.1 K/uL Final  . Sodium 11/24/2014 139  135 - 145 mmol/L Final  . Potassium 11/24/2014 4.2  3.5 - 5.1 mmol/L Final  . Chloride 11/24/2014 101  101 - 111 mmol/L Final  . CO2 11/24/2014 30  22 - 32 mmol/L Final  . Glucose, Bld 11/24/2014 95  65 - 99 mg/dL Final  . BUN 11/24/2014 19  6 - 20 mg/dL Final  . Creatinine, Ser 11/24/2014 0.85  0.44 -  1.00 mg/dL Final  . Calcium 11/24/2014 9.4  8.9 - 10.3 mg/dL Final  . Total Protein 11/24/2014 9.1* 6.5 - 8.1 g/dL Final  . Albumin 11/24/2014 3.8  3.5 - 5.0 g/dL Final  . AST 11/24/2014 28  15 - 41 U/L Final  . ALT 11/24/2014 12* 14 - 54 U/L Final  . Alkaline Phosphatase 11/24/2014 57  38 - 126 U/L Final  . Total Bilirubin 11/24/2014 0.2* 0.3 - 1.2 mg/dL Final  . GFR calc non Af Amer 11/24/2014 >60  >60 mL/min Final  . GFR calc Af Amer 11/24/2014 >60  >60 mL/min Final   Comment: (NOTE) The eGFR has been calculated using the CKD EPI equation. This calculation has not been validated in all clinical situations. eGFR's persistently <60 mL/min signify possible Chronic Kidney Disease.   . Anion gap 11/24/2014 8  5 - 15 Final  . Magnesium 11/24/2014 1.9  1.7 - 2.4 mg/dL Final    Assessment:  BRIEANNA NAU is a 64 y.o. female with metastatic Her2/neu positive right breast cancer.  She initially presented in 03/2013 with an ulcerated breast mass and back pain.  Breast biopsy revealed lobular breast cancer.  Tumor was ER/PR positive and HER-2/neu positive. PET scan revealed bone metastasis. She received palliative radiation to T8 and T10 from 04/02-04/15/2015.  She declined chemotherapy. She initially began letrozole and Tykerb. Tykerb was discontinued after 1 dose. She received letrozole from 05/02/2013 until 01/07/2014 .  She received Herceptin from 06/02/2013 until  12/15/2013. She has received fulvestrant monthly from 01/07/2014 until 05/27/2014.  She has received Xgeva monthly (03/31/2013 until 07/27/2014).  Notes indicate Herceptin was discontinued for a rising CA-27-29.  CA27.29 was 916.3 on 03/19/2013, 286.2 on 06/23/2013, 70.8 on 08/25/2013, 56.3 on 10/08/2013, 102.7 on 12/15/2013, 149.4 on 01/07/2014, 178.0 on 02/04/2014, 190.7 on 03/30/2014, 143.2 on 04/22/2014, 134.8 on 05/20/2014, 248.3 on 07/09/2014, 723.2 on 08/25/2014, 252.3 on 10/08/2014, 126 on 10/29/2014, and 71.3 on 11/24/2014.   She received Ibrance and Faslodex from 02/23/2014 until 07/27/2014.  Echo on 09/04/2014 revealed an EF of 55-65%  PET scan on 08/28/2014 revealed significant interval worsening and multifocal osseous metastatic disease. Lesions had increased in number and metabolic activity. There were no extra osseous metastasis.  She is s/p 3 cycles of Kadcyla (09/10/2014-10/29/2014).  She has tolerated her infusions well.  Counts are good.  Serum protein is elevated.  Plan: 1.  Labs today:  CBC with diff, CMP, Mg, CA27.29. 2.  Cycle #4 Kadcyla 3.  Refill medications:  . 4.  Add to labs at next visit:  SPEP and serum immunoglobulins. 5.  RTC on 12/15/2014 for MD assessment, labs (CBC with diff, CMP, CA27.29), and cycle #5 Kadcyla (TDM-1; trastuzumab emtansine).  Addendum:  Next echo needed in 12/2014 prior to treatment.  Lequita Asal, MD  11/24/2014, 10:53 AM

## 2014-11-25 ENCOUNTER — Telehealth: Payer: Self-pay

## 2014-11-25 LAB — CANCER ANTIGEN 27.29: CA 27.29: 71.3 U/mL — ABNORMAL HIGH (ref 0.0–38.6)

## 2014-11-25 NOTE — Telephone Encounter (Signed)
Called and reported the pts ca 27.29 was coming down per md to 71.3  Pt verbalized an understanding and was extremely happy.

## 2014-12-12 ENCOUNTER — Other Ambulatory Visit: Payer: Self-pay | Admitting: Hematology and Oncology

## 2014-12-13 ENCOUNTER — Encounter: Payer: Self-pay | Admitting: Hematology and Oncology

## 2014-12-13 ENCOUNTER — Other Ambulatory Visit: Payer: Self-pay | Admitting: Hematology and Oncology

## 2014-12-13 DIAGNOSIS — C50919 Malignant neoplasm of unspecified site of unspecified female breast: Secondary | ICD-10-CM

## 2014-12-14 ENCOUNTER — Ambulatory Visit
Admission: RE | Admit: 2014-12-14 | Discharge: 2014-12-14 | Disposition: A | Discharge status: Home / Self Care | Payer: Medicare Other | Source: Ambulatory Visit | Attending: Hematology and Oncology | Admitting: Hematology and Oncology

## 2014-12-14 DIAGNOSIS — C50919 Malignant neoplasm of unspecified site of unspecified female breast: Secondary | ICD-10-CM | POA: Diagnosis not present

## 2014-12-14 DIAGNOSIS — R0689 Other abnormalities of breathing: Secondary | ICD-10-CM | POA: Diagnosis not present

## 2014-12-14 NOTE — Progress Notes (Signed)
*  PRELIMINARY RESULTS* Echocardiogram 2D Echocardiogram has been performed.  Elizabeth Bond 12/14/2014, 12:15 PM

## 2014-12-15 ENCOUNTER — Inpatient Hospital Stay: Payer: Medicare Other

## 2014-12-15 ENCOUNTER — Inpatient Hospital Stay: Payer: Medicare Other | Attending: Hematology and Oncology

## 2014-12-15 ENCOUNTER — Encounter: Payer: Self-pay | Admitting: Hematology and Oncology

## 2014-12-15 ENCOUNTER — Inpatient Hospital Stay (HOSPITAL_BASED_OUTPATIENT_CLINIC_OR_DEPARTMENT_OTHER): Payer: Medicare Other | Admitting: Hematology and Oncology

## 2014-12-15 VITALS — BP 134/82 | HR 85 | Temp 97.5°F | Resp 18 | Ht 65.0 in | Wt 116.2 lb

## 2014-12-15 DIAGNOSIS — C7951 Secondary malignant neoplasm of bone: Secondary | ICD-10-CM | POA: Insufficient documentation

## 2014-12-15 DIAGNOSIS — Z79899 Other long term (current) drug therapy: Secondary | ICD-10-CM | POA: Diagnosis not present

## 2014-12-15 DIAGNOSIS — C50911 Malignant neoplasm of unspecified site of right female breast: Secondary | ICD-10-CM | POA: Insufficient documentation

## 2014-12-15 DIAGNOSIS — K589 Irritable bowel syndrome without diarrhea: Secondary | ICD-10-CM | POA: Diagnosis not present

## 2014-12-15 DIAGNOSIS — Z5112 Encounter for antineoplastic immunotherapy: Secondary | ICD-10-CM | POA: Diagnosis not present

## 2014-12-15 DIAGNOSIS — Z170 Estrogen receptor positive status [ER+]: Secondary | ICD-10-CM | POA: Diagnosis not present

## 2014-12-15 DIAGNOSIS — Z923 Personal history of irradiation: Secondary | ICD-10-CM

## 2014-12-15 DIAGNOSIS — Z87891 Personal history of nicotine dependence: Secondary | ICD-10-CM | POA: Insufficient documentation

## 2014-12-15 DIAGNOSIS — C50919 Malignant neoplasm of unspecified site of unspecified female breast: Secondary | ICD-10-CM

## 2014-12-15 DIAGNOSIS — Z9221 Personal history of antineoplastic chemotherapy: Secondary | ICD-10-CM | POA: Insufficient documentation

## 2014-12-15 LAB — COMPREHENSIVE METABOLIC PANEL
ALT: 14 U/L (ref 14–54)
AST: 27 U/L (ref 15–41)
Albumin: 3.6 g/dL (ref 3.5–5.0)
Alkaline Phosphatase: 54 U/L (ref 38–126)
Anion gap: 7 (ref 5–15)
BUN: 19 mg/dL (ref 6–20)
CO2: 28 mmol/L (ref 22–32)
Calcium: 9.4 mg/dL (ref 8.9–10.3)
Chloride: 100 mmol/L — ABNORMAL LOW (ref 101–111)
Creatinine, Ser: 0.78 mg/dL (ref 0.44–1.00)
GFR calc Af Amer: >60 mL/min (ref >60)
GFR calc non Af Amer: >60 mL/min (ref >60)
Glucose, Bld: 83 mg/dL (ref 65–99)
Potassium: 4.2 mmol/L (ref 3.5–5.1)
Sodium: 135 mmol/L (ref 135–145)
Total Bilirubin: 0.3 mg/dL (ref 0.3–1.2)
Total Protein: 8.7 g/dL — ABNORMAL HIGH (ref 6.5–8.1)

## 2014-12-15 LAB — CBC WITH DIFFERENTIAL/PLATELET
Basophils Absolute: 0.1 10*3/uL (ref 0–0.1)
Basophils Relative: 1 %
Eosinophils Absolute: 0.1 10*3/uL (ref 0–0.7)
Eosinophils Relative: 2 %
HCT: 35 % (ref 35.0–47.0)
Hemoglobin: 11.6 g/dL — ABNORMAL LOW (ref 12.0–16.0)
Lymphocytes Relative: 15 %
Lymphs Abs: 1 10*3/uL (ref 1.0–3.6)
MCH: 29.9 pg (ref 26.0–34.0)
MCHC: 33.1 g/dL (ref 32.0–36.0)
MCV: 90.3 fL (ref 80.0–100.0)
Monocytes Absolute: 0.5 10*3/uL (ref 0.2–0.9)
Monocytes Relative: 7 %
Neutro Abs: 5.1 10*3/uL (ref 1.4–6.5)
Neutrophils Relative %: 75 %
Platelets: 246 10*3/uL (ref 150–440)
RBC: 3.87 MIL/uL (ref 3.80–5.20)
RDW: 14.9 % — ABNORMAL HIGH (ref 11.5–14.5)
WBC: 6.8 10*3/uL (ref 3.6–11.0)

## 2014-12-15 MED ORDER — SODIUM CHLORIDE 0.9 % IJ SOLN
10.0000 mL | Freq: Once | INTRAMUSCULAR | Status: AC
  Administered 2014-12-15: 10 mL via INTRAVENOUS
  Filled 2014-12-15: qty 10

## 2014-12-15 MED ORDER — DENOSUMAB 120 MG/1.7ML ~~LOC~~ SOLN
120.0000 mg | Freq: Once | SUBCUTANEOUS | Status: AC
  Administered 2014-12-15: 120 mg via SUBCUTANEOUS
  Filled 2014-12-15: qty 1.7

## 2014-12-15 MED ORDER — HEPARIN SOD (PORK) LOCK FLUSH 100 UNIT/ML IV SOLN
INTRAVENOUS | Status: AC
  Filled 2014-12-15: qty 5

## 2014-12-15 MED ORDER — SODIUM CHLORIDE 0.9 % IJ SOLN
10.0000 mL | INTRAMUSCULAR | Status: DC | PRN
  Filled 2014-12-15: qty 10

## 2014-12-15 MED ORDER — SODIUM CHLORIDE 0.9 % IV SOLN
Freq: Once | INTRAVENOUS | Status: AC
  Administered 2014-12-15: 11:00:00 via INTRAVENOUS
  Filled 2014-12-15: qty 1000

## 2014-12-15 MED ORDER — DIPHENHYDRAMINE HCL 25 MG PO CAPS
50.0000 mg | ORAL_CAPSULE | Freq: Once | ORAL | Status: AC
  Administered 2014-12-15: 50 mg via ORAL
  Filled 2014-12-15: qty 2

## 2014-12-15 MED ORDER — HEPARIN SOD (PORK) LOCK FLUSH 100 UNIT/ML IV SOLN
500.0000 [IU] | Freq: Once | INTRAVENOUS | Status: AC
  Administered 2014-12-15: 500 [IU] via INTRAVENOUS

## 2014-12-15 MED ORDER — ADO-TRASTUZUMAB EMTANSINE CHEMO INJECTION 160 MG
3.6000 mg/kg | Freq: Once | INTRAVENOUS | Status: AC
  Administered 2014-12-15: 200 mg via INTRAVENOUS
  Filled 2014-12-15: qty 10

## 2014-12-15 MED ORDER — ACETAMINOPHEN 325 MG PO TABS
650.0000 mg | ORAL_TABLET | Freq: Once | ORAL | Status: AC
  Administered 2014-12-15: 650 mg via ORAL
  Filled 2014-12-15: qty 2

## 2014-12-15 NOTE — Progress Notes (Signed)
[facility] day:  12/15/2014   Chief Complaint: Elizabeth Bond is a 64 y.o. female with metastatic Her2/neu positive breast cancer who is seen for assessment prior to cycle #5 Kadcyla.  HPI:  The patient was last seen in the medical oncology clinic on 11/24/2014.  At that time, she received cycle #4 Kadcyla.  Symptomatically she was doing well. She voiced no complaint. CA-27-29 had decreased from 126 on 10/29/2014 to 71.3.  Echo on 12/14/2014 revealed an EF of 55-60%.  During the interim, she has done well.  She notes a "couple of episodes" of shortness of breath and dizziness when she gets upset.  She voices no concerns.    Past Medical History  Diagnosis Date  . IBS (irritable bowel syndrome)   . Cancer ([place]) 2015    breast and bone  . Breast cancer ([place])     right  . 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 Replacement total knee Left 2004-2005?  Marland Kitchen Abdominal hysterectomy  1978  . Portacath placement  2015    Family History  Problem Relation Age of Onset  . Cancer Father     prostate  . Cancer Maternal Aunt     breast  . Cancer Cousin     breast  . Cancer Other     lung cancer - neice  . Cancer - Other Daughter     brain  . Cancer Mother     Pancreatic    Social History:  reports that she quit smoking about 20 months ago. Her smoking use included Cigarettes. She has a 10 pack-year smoking history. She has never used smokeless tobacco. She reports that she drinks alcohol. She reports that she does not use illicit drugs.  The patient is alone today.  Allergies:  Allergies  Allergen Reactions  . Fentanyl Other (See Comments)    "loopy and confused"    Current Medications: Current Outpatient Prescriptions  Medication Sig Dispense Refill  . acetaminophen (TYLENOL) 500 MG tablet Take 1,000 mg by mouth every 8 (eight) hours as needed for headache.    . Calcium 600-200  MG-UNIT tablet Take 1 tablet by mouth 2 (two) times daily. 60 tablet 3  . calcium carbonate (OS-CAL) 600 MG TABS tablet Take 1 tablet (600 mg total) by mouth 2 (two) times daily with a meal. 60 tablet 3  . clonazePAM (KLONOPIN) 0.5 MG tablet Take 1 tablet (0.5 mg total) by mouth 3 (three) times daily as needed for anxiety. 90 tablet 2  . ondansetron (ZOFRAN) 4 MG tablet Take 1 tablet (4 mg total) by mouth every 8 (eight) hours as needed for nausea or vomiting. 20 tablet 1  . oxycodone (OXY-IR) 5 MG capsule Take 1 -2 tablets every 4 hours by mouth as needed 60 capsule 0  . oxyCODONE (OXYCONTIN) 20 mg 12 hr tablet Take 1 tablet (20 mg total) by mouth every 12 (twelve) hours. 60 tablet 0  . pantoprazole (PROTONIX) 40 MG tablet Take 1 tablet (40 mg total) by mouth daily. 30 tablet 6  . phosphorus (PHOSPHA 250 NEUTRAL) 155-852-130 MG tablet Take 1 tablet (250 mg total) by mouth 2 (two) times daily. 60 tablet 2  . potassium chloride (K-DUR) 10 MEQ tablet Take 1 tablet (10 mEq total) by mouth daily. 30 tablet 2  . furosemide (LASIX) 20 MG tablet 1 tab by mouth every other day as needed for edema (  Patient not taking: Reported on 12/15/2014) 30 tablet 2   No current facility-administered medications for this visit.   Facility-Administered Medications Ordered in Other Visits  Medication Dose Route Frequency Provider Last Rate Last Dose  . sodium chloride 0.9 % injection 10 mL  10 mL Intravenous PRN Lequita Asal, MD        Review of Systems:  GENERAL:  Feels good.  No fevers, sweats or weight loss. PERFORMANCE STATUS (ECOG):  0 HEENT:  No visual changes, runny nose, sore throat, mouth sores or tenderness. Lungs: No shortness of breath or cough.  No hemoptysis. Cardiac:  No chest pain, palpitations, orthopnea, or PND. GI:  No nausea, vomiting, diarrhea, constipation, melena or hematochezia. GU:  No urgency, frequency, dysuria, or hematuria. Musculoskeletal:  No back pain.  No joint pain.  No  muscle tenderness. Extremities:  No pain or swelling. Skin:  No rashes or skin changes. Neuro:  No headache, numbness or weakness, balance or coordination issues. Endocrine:  No diabetes, thyroid issues, hot flashes or night sweats. Psych:  No mood changes, depression or anxiety. Pain:  No focal pain. Review of systems:  All other systems reviewed and found to be negative.   Physical Exam: Blood pressure 134/82, pulse 85, temperature 97.5 F (36.4 C), temperature source Tympanic, resp. rate 18, height _0  (1.651 m), weight 116 lb 2.9 oz (52.7 kg). GENERAL:  Well developed, well nourished, sitting comfortably in the exam room in no acute distress. MENTAL STATUS:  Alert and oriented to person, place and time. HEAD:  Short styled gray hair.  Normocephalic, atraumatic, face symmetric, no Cushingoid features. EYES: Hazel eyes.  Pupils equal round and reactive to light and accomodation.  No conjunctivitis or scleral icterus. ENT:  Oropharynx clear without lesion.  Tongue normal. Mucous membranes moist.  RESPIRATORY:  Clear to auscultation without rales, wheezes or rhonchi. CARDIOVASCULAR:  Regular rate and rhythm without murmur, rub or gallop.  No JVD. ABDOMEN:  Soft, non-tender, with active bowel sounds, and no hepatosplenomegaly.  No masses. SKIN:  No rashes, ulcers or lesions. EXTREMITIES: No edema, no skin discoloration or tenderness.  No palpable cords. LYMPH NODES: No palpable cervical, supraclavicular, axillary or inguinal adenopathy  NEUROLOGICAL: Unremarkable. PSYCH:  Appropriate.  NEUROLOGICAL: Unremarkable.  Infusion on 12/15/2014  Component Date Value Ref Range Status  . WBC 12/15/2014 6.8  3.6 - 11.0 K/uL Final  . RBC 12/15/2014 3.87  3.80 - 5.20 MIL/uL Final  . Hemoglobin 12/15/2014 11.6* 12.0 - 16.0 g/dL Final  . HCT 12/15/2014 35.0  35.0 - 47.0 % Final  . MCV 12/15/2014 90.3  80.0 - 100.0 fL Final  . MCH 12/15/2014 29.9  26.0 - 34.0 pg Final  . MCHC 12/15/2014 33.1   32.0 - 36.0 g/dL Final  . RDW 12/15/2014 14.9* 11.5 - 14.5 % Final  . Platelets 12/15/2014 246  150 - 440 K/uL Final  . Neutrophils Relative % 12/15/2014 75   Final  . Neutro Abs 12/15/2014 5.1  1.4 - 6.5 K/uL Final  . Lymphocytes Relative 12/15/2014 15   Final  . Lymphs Abs 12/15/2014 1.0  1.0 - 3.6 K/uL Final  . Monocytes Relative 12/15/2014 7   Final  . Monocytes Absolute 12/15/2014 0.5  0.2 - 0.9 K/uL Final  . Eosinophils Relative 12/15/2014 2   Final  . Eosinophils Absolute 12/15/2014 0.1  0 - 0.7 K/uL Final  . Basophils Relative 12/15/2014 1   Final  . Basophils Absolute 12/15/2014 0.1  0 - 0.1  K/uL Final  . Sodium 12/15/2014 135  135 - 145 mmol/L Final  . Potassium 12/15/2014 4.2  3.5 - 5.1 mmol/L Final  . Chloride 12/15/2014 100* 101 - 111 mmol/L Final  . CO2 12/15/2014 28  22 - 32 mmol/L Final  . Glucose, Bld 12/15/2014 83  65 - 99 mg/dL Final  . BUN 12/15/2014 19  6 - 20 mg/dL Final  . Creatinine, Ser 12/15/2014 0.78  0.44 - 1.00 mg/dL Final  . Calcium 12/15/2014 9.4  8.9 - 10.3 mg/dL Final  . Total Protein 12/15/2014 8.7* 6.5 - 8.1 g/dL Final  . Albumin 12/15/2014 3.6  3.5 - 5.0 g/dL Final  . AST 12/15/2014 27  15 - 41 U/L Final  . ALT 12/15/2014 14  14 - 54 U/L Final  . Alkaline Phosphatase 12/15/2014 54  38 - 126 U/L Final  . Total Bilirubin 12/15/2014 0.3  0.3 - 1.2 mg/dL Final  . GFR calc non Af Amer 12/15/2014 >60  >60 mL/min Final  . GFR calc Af Amer 12/15/2014 >60  >60 mL/min Final   Comment: (NOTE) The eGFR has been calculated using the CKD EPI equation. This calculation has not been validated in all clinical situations. eGFR's persistently <60 mL/min signify possible Chronic Kidney Disease.   Georgiann Hahn gap 12/15/2014 7  5 - 15 Final    Assessment:  Elizabeth Bond is a 64 y.o. female with metastatic Her2/neu positive right breast cancer.  She initially presented in 03/2013 with an ulcerated breast mass and back pain.  Breast biopsy revealed lobular breast cancer.   Tumor was ER/PR positive and HER-2/neu positive. PET scan revealed bone metastasis. She received palliative radiation to T8 and T10 from 04/02-04/15/2015.  She declined chemotherapy. She initially began letrozole and Tykerb. Tykerb was discontinued after 1 dose. She received letrozole from 05/02/2013 until 01/07/2014 .  She received Herceptin from 06/02/2013 until 12/15/2013. She has received fulvestrant monthly from 01/07/2014 until 05/27/2014.  She has received Xgeva monthly (03/31/2013 until 08/25/2014).  Notes indicate Herceptin was discontinued for a rising CA-27-29.  CA27.29 was 916.3 on 03/19/2013, 286.2 on 06/23/2013, 70.8 on 08/25/2013, 56.3 on 10/08/2013, 102.7 on 12/15/2013, 149.4 on 01/07/2014, 178.0 on 02/04/2014, 190.7 on 03/30/2014, 143.2 on 04/22/2014, 134.8 on 05/20/2014, 248.3 on 07/09/2014, 723.2 on 08/25/2014, 252.3 on 10/08/2014, 126 on 10/29/2014, and 71.3 on 11/24/2014.   She received Ibrance and Faslodex from 02/23/2014 until 07/27/2014.  Echo on 09/04/2014 revealed an EF of 55-65%.  Echo on 12/14/2014 revealed an EF of 55-60%.  PET scan on 08/28/2014 revealed significant interval worsening and multifocal osseous metastatic disease. Lesions had increased in number and metabolic activity. There were no extra osseous metastasis.  She is s/p 4 cycles of Kadcyla (09/10/2014-11/24/2014).  She is tolerating treatment well.  She denies any bone pain.  Serum protein is elevated.  Plan: 1.  Labs today:  CBC with diff, CMP, Mg, CA27.29. 2.  Assess elevated protein:  SPEP, immunoglobulins. 3.  Review echo.  Review declining tumor marker. 4.  Cycle #5 Kadcyla. 5.  Xgeva today. 6.  Schedule PET scan 02/08/2015:  follow-up metastatic disease (date in February per patient request) 7.  Schedule next echo 03/15/2015. 8.  RTC in 3 weeks for MD assessment, labs (CBC with diff, CMP, CA27.29), and cycle #6 Kadcyla (TDM-1; trastuzumab emtansine).   Lequita Asal, MD  12/15/2014, 4:23  PM

## 2014-12-15 NOTE — Progress Notes (Signed)
Patient is here for follow-up of breast cancer and kadcyla treatment. Patient states that she has been doing good and has no complaints today.

## 2014-12-16 LAB — PROTEIN ELECTROPHORESIS, SERUM
A/G Ratio: 0.7 (ref 0.7–1.7)
Albumin ELP: 3.1 g/dL (ref 2.9–4.4)
Alpha-1-Globulin: 0.4 g/dL (ref 0.0–0.4)
Alpha-2-Globulin: 0.9 g/dL (ref 0.4–1.0)
Beta Globulin: 1 g/dL (ref 0.7–1.3)
Gamma Globulin: 2.4 g/dL — ABNORMAL HIGH (ref 0.4–1.8)
Globulin, Total: 4.6 g/dL — ABNORMAL HIGH (ref 2.2–3.9)
Total Protein ELP: 7.7 g/dL (ref 6.0–8.5)

## 2014-12-16 LAB — IGG, IGA, IGM
IgA: 310 mg/dL (ref 87–352)
IgG (Immunoglobin G), Serum: 2380 mg/dL — ABNORMAL HIGH (ref 700–1600)
IgM, Serum: 66 mg/dL (ref 26–217)

## 2014-12-16 LAB — CANCER ANTIGEN 27.29: CA 27.29: 55 U/mL — ABNORMAL HIGH (ref 0.0–38.6)

## 2014-12-18 ENCOUNTER — Other Ambulatory Visit: Payer: Self-pay | Admitting: Family Medicine

## 2014-12-18 ENCOUNTER — Telehealth: Payer: Self-pay | Admitting: *Deleted

## 2014-12-18 DIAGNOSIS — C50919 Malignant neoplasm of unspecified site of unspecified female breast: Secondary | ICD-10-CM

## 2014-12-18 MED ORDER — OXYCODONE HCL ER 20 MG PO T12A: 20.0000 mg | EXTENDED_RELEASE_TABLET | Freq: Two times a day (BID) | ORAL | Status: DC

## 2014-12-18 MED ORDER — K PHOS MONO-SOD PHOS DI & MONO 155-852-130 MG PO TABS: 250.0000 mg | ORAL_TABLET | Freq: Two times a day (BID) | ORAL | Status: DC

## 2014-12-18 NOTE — Telephone Encounter (Signed)
Informed that prescription is ready to pick up  

## 2014-12-22 ENCOUNTER — Telehealth: Payer: Self-pay | Admitting: *Deleted

## 2014-12-22 ENCOUNTER — Emergency Department: Payer: Medicare Other

## 2014-12-22 ENCOUNTER — Encounter: Payer: Self-pay | Admitting: *Deleted

## 2014-12-22 ENCOUNTER — Inpatient Hospital Stay
Admission: EM | Admit: 2014-12-22 | Discharge: 2014-12-24 | DRG: 872 | Disposition: A | Discharge status: Home / Self Care | Payer: Medicare Other | Attending: Internal Medicine | Admitting: Internal Medicine

## 2014-12-22 DIAGNOSIS — Z888 Allergy status to other drugs, medicaments and biological substances status: Secondary | ICD-10-CM

## 2014-12-22 DIAGNOSIS — J449 Chronic obstructive pulmonary disease, unspecified: Secondary | ICD-10-CM | POA: Diagnosis present

## 2014-12-22 DIAGNOSIS — Z87891 Personal history of nicotine dependence: Secondary | ICD-10-CM | POA: Diagnosis not present

## 2014-12-22 DIAGNOSIS — N3000 Acute cystitis without hematuria: Secondary | ICD-10-CM | POA: Diagnosis not present

## 2014-12-22 DIAGNOSIS — Z5111 Encounter for antineoplastic chemotherapy: Secondary | ICD-10-CM | POA: Diagnosis not present

## 2014-12-22 DIAGNOSIS — C50919 Malignant neoplasm of unspecified site of unspecified female breast: Secondary | ICD-10-CM | POA: Diagnosis present

## 2014-12-22 DIAGNOSIS — R509 Fever, unspecified: Secondary | ICD-10-CM | POA: Diagnosis not present

## 2014-12-22 DIAGNOSIS — C7951 Secondary malignant neoplasm of bone: Secondary | ICD-10-CM | POA: Diagnosis present

## 2014-12-22 DIAGNOSIS — E871 Hypo-osmolality and hyponatremia: Secondary | ICD-10-CM | POA: Diagnosis present

## 2014-12-22 DIAGNOSIS — A419 Sepsis, unspecified organism: Principal | ICD-10-CM | POA: Diagnosis present

## 2014-12-22 DIAGNOSIS — K589 Irritable bowel syndrome without diarrhea: Secondary | ICD-10-CM | POA: Diagnosis present

## 2014-12-22 DIAGNOSIS — N390 Urinary tract infection, site not specified: Secondary | ICD-10-CM | POA: Diagnosis not present

## 2014-12-22 DIAGNOSIS — R05 Cough: Secondary | ICD-10-CM | POA: Diagnosis not present

## 2014-12-22 HISTORY — DX: Chronic obstructive pulmonary disease, unspecified: J44.9

## 2014-12-22 LAB — URINALYSIS COMPLETE WITH MICROSCOPIC (ARMC ONLY)
BILIRUBIN URINE: NEGATIVE
Glucose, UA: NEGATIVE mg/dL
Hgb urine dipstick: NEGATIVE
NITRITE: NEGATIVE
SPECIFIC GRAVITY, URINE: 1.019 (ref 1.005–1.030)
pH: 5 (ref 5.0–8.0)

## 2014-12-22 LAB — COMPREHENSIVE METABOLIC PANEL
ALT: 25 U/L (ref 14–54)
AST: 68 U/L — ABNORMAL HIGH (ref 15–41)
Albumin: 3.6 g/dL (ref 3.5–5.0)
Alkaline Phosphatase: 70 U/L (ref 38–126)
Anion gap: 10 (ref 5–15)
BUN: 14 mg/dL (ref 6–20)
CO2: 27 mmol/L (ref 22–32)
Calcium: 10.5 mg/dL — ABNORMAL HIGH (ref 8.9–10.3)
Chloride: 97 mmol/L — ABNORMAL LOW (ref 101–111)
Creatinine, Ser: 0.79 mg/dL (ref 0.44–1.00)
GFR calc Af Amer: >60 mL/min (ref >60)
GFR calc non Af Amer: >60 mL/min (ref >60)
Glucose, Bld: 104 mg/dL — ABNORMAL HIGH (ref 65–99)
Potassium: 3.5 mmol/L (ref 3.5–5.1)
Sodium: 134 mmol/L — ABNORMAL LOW (ref 135–145)
Total Bilirubin: 0.4 mg/dL (ref 0.3–1.2)
Total Protein: 9.1 g/dL — ABNORMAL HIGH (ref 6.5–8.1)

## 2014-12-22 LAB — CBC
HCT: 37.3 % (ref 35.0–47.0)
Hemoglobin: 12.6 g/dL (ref 12.0–16.0)
MCH: 30.4 pg (ref 26.0–34.0)
MCHC: 33.8 g/dL (ref 32.0–36.0)
MCV: 89.9 fL (ref 80.0–100.0)
Platelets: 141 10*3/uL — ABNORMAL LOW (ref 150–440)
RBC: 4.15 MIL/uL (ref 3.80–5.20)
RDW: 14.9 % — ABNORMAL HIGH (ref 11.5–14.5)
WBC: 9.3 10*3/uL (ref 3.6–11.0)

## 2014-12-22 LAB — LACTIC ACID, PLASMA: Lactic Acid, Venous: 0.7 mmol/L (ref 0.5–2.0)

## 2014-12-22 LAB — LIPASE, BLOOD: Lipase: 15 U/L (ref 11–51)

## 2014-12-22 MED ORDER — SODIUM CHLORIDE 0.9 % IV BOLUS (SEPSIS)
1000.0000 mL | Freq: Once | INTRAVENOUS | Status: AC
Start: 2014-12-22 — End: 2014-12-22
  Administered 2014-12-22: 1000 mL via INTRAVENOUS

## 2014-12-22 MED ORDER — DEXTROSE 5 % IV SOLN
1.0000 g | Freq: Once | INTRAVENOUS | Status: AC
  Administered 2014-12-22: 1 g via INTRAVENOUS
  Filled 2014-12-22: qty 10

## 2014-12-22 NOTE — ED Notes (Signed)
Patient report vomiting since last night and a 104 degree fever at 1700. Patient is currently receiving chemo for breast and bone cancer. Patient reports a dry cough. Patient denies pain at this time.

## 2014-12-22 NOTE — Telephone Encounter (Signed)
Per Dr. Loletha Grayer see would really like to see pt and get a chest x-ray.  Does she have any allergies and is she using any medications at this time for COPD?  How high of a fever?

## 2014-12-22 NOTE — Telephone Encounter (Signed)
  Called patient.  Patient did not answer phone.  Encourage her to call an ambulance if unable to come to hospital.  Jerilynn Mages

## 2014-12-22 NOTE — ED Notes (Signed)
Joe in lab told to add urine culture on to urine specimen in lab

## 2014-12-22 NOTE — Telephone Encounter (Signed)
Feeling very bad and asking for med to be called in for cough and fever,. States she is not well enough to come in to be seen. She had called her PCP, but they refused to give her something without seeing her first

## 2014-12-22 NOTE — Telephone Encounter (Signed)
I spoke with Elizabeth Bond who states she is on the way to her mothers house now and if she cannot get her into the car, she will call EMS

## 2014-12-23 DIAGNOSIS — K589 Irritable bowel syndrome without diarrhea: Secondary | ICD-10-CM | POA: Diagnosis present

## 2014-12-23 DIAGNOSIS — N3000 Acute cystitis without hematuria: Secondary | ICD-10-CM | POA: Diagnosis not present

## 2014-12-23 DIAGNOSIS — E871 Hypo-osmolality and hyponatremia: Secondary | ICD-10-CM | POA: Diagnosis present

## 2014-12-23 DIAGNOSIS — A419 Sepsis, unspecified organism: Secondary | ICD-10-CM | POA: Diagnosis present

## 2014-12-23 DIAGNOSIS — N390 Urinary tract infection, site not specified: Secondary | ICD-10-CM | POA: Diagnosis not present

## 2014-12-23 DIAGNOSIS — C50919 Malignant neoplasm of unspecified site of unspecified female breast: Secondary | ICD-10-CM | POA: Diagnosis not present

## 2014-12-23 DIAGNOSIS — Z888 Allergy status to other drugs, medicaments and biological substances status: Secondary | ICD-10-CM | POA: Diagnosis not present

## 2014-12-23 DIAGNOSIS — C7951 Secondary malignant neoplasm of bone: Secondary | ICD-10-CM | POA: Diagnosis present

## 2014-12-23 DIAGNOSIS — Z87891 Personal history of nicotine dependence: Secondary | ICD-10-CM | POA: Diagnosis not present

## 2014-12-23 DIAGNOSIS — J449 Chronic obstructive pulmonary disease, unspecified: Secondary | ICD-10-CM | POA: Diagnosis not present

## 2014-12-23 LAB — TSH: TSH: 0.714 u[IU]/mL (ref 0.350–4.500)

## 2014-12-23 LAB — LACTIC ACID, PLASMA: LACTIC ACID, VENOUS: 0.7 mmol/L (ref 0.5–2.0)

## 2014-12-23 MED ORDER — K PHOS MONO-SOD PHOS DI & MONO 155-852-130 MG PO TABS
250.0000 mg | ORAL_TABLET | Freq: Two times a day (BID) | ORAL | Status: DC
  Administered 2014-12-23 – 2014-12-24 (×3): 250 mg via ORAL
  Filled 2014-12-23 (×3): qty 1

## 2014-12-23 MED ORDER — HEPARIN SODIUM (PORCINE) 5000 UNIT/ML IJ SOLN
5000.0000 [IU] | Freq: Three times a day (TID) | INTRAMUSCULAR | Status: DC
Start: 2014-12-23 — End: 2014-12-24
  Administered 2014-12-23 – 2014-12-24 (×4): 5000 [IU] via SUBCUTANEOUS
  Filled 2014-12-23 (×4): qty 1

## 2014-12-23 MED ORDER — CLONAZEPAM 0.5 MG PO TABS: 0.5000 mg | ORAL_TABLET | Freq: Three times a day (TID) | ORAL | Status: DC | PRN

## 2014-12-23 MED ORDER — VANCOMYCIN HCL IN DEXTROSE 1-5 GM/200ML-% IV SOLN
1000.0000 mg | Freq: Once | INTRAVENOUS | Status: AC
  Administered 2014-12-23: 1000 mg via INTRAVENOUS
  Filled 2014-12-23: qty 200

## 2014-12-23 MED ORDER — IBUPROFEN 600 MG PO TABS
600.0000 mg | ORAL_TABLET | Freq: Once | ORAL | Status: AC
  Administered 2014-12-23: 600 mg via ORAL
  Filled 2014-12-23: qty 1

## 2014-12-23 MED ORDER — DEXTROSE 5 % IV SOLN
1.0000 g | INTRAVENOUS | Status: DC
  Administered 2014-12-23: 22:00:00 1 g via INTRAVENOUS
  Filled 2014-12-23 (×2): qty 10

## 2014-12-23 MED ORDER — CALCIUM CARBONATE ANTACID 500 MG PO CHEW
600.0000 mg | CHEWABLE_TABLET | Freq: Two times a day (BID) | ORAL | Status: DC
  Administered 2014-12-23: 600 mg via ORAL
  Filled 2014-12-23: qty 3

## 2014-12-23 MED ORDER — POTASSIUM CHLORIDE CRYS ER 10 MEQ PO TBCR
10.0000 meq | EXTENDED_RELEASE_TABLET | Freq: Every day | ORAL | Status: DC
  Administered 2014-12-23 – 2014-12-24 (×2): 10 meq via ORAL
  Filled 2014-12-23 (×2): qty 1

## 2014-12-23 MED ORDER — PANTOPRAZOLE SODIUM 40 MG PO TBEC
40.0000 mg | DELAYED_RELEASE_TABLET | Freq: Every day | ORAL | Status: DC
  Administered 2014-12-23 – 2014-12-24 (×2): 40 mg via ORAL
  Filled 2014-12-23 (×2): qty 1

## 2014-12-23 MED ORDER — VANCOMYCIN HCL IN DEXTROSE 1-5 GM/200ML-% IV SOLN
1000.0000 mg | INTRAVENOUS | Status: DC
  Administered 2014-12-23: 15:00:00 1000 mg via INTRAVENOUS
  Filled 2014-12-23 (×2): qty 200

## 2014-12-23 MED ORDER — DOCUSATE SODIUM 100 MG PO CAPS
100.0000 mg | ORAL_CAPSULE | Freq: Two times a day (BID) | ORAL | Status: DC
  Administered 2014-12-23 – 2014-12-24 (×2): 100 mg via ORAL
  Filled 2014-12-23 (×2): qty 1

## 2014-12-23 MED ORDER — ACETAMINOPHEN 500 MG PO TABS
1000.0000 mg | ORAL_TABLET | Freq: Three times a day (TID) | ORAL | Status: DC | PRN
  Administered 2014-12-23: 17:00:00 1000 mg via ORAL
  Filled 2014-12-23: qty 2

## 2014-12-23 MED ORDER — MORPHINE SULFATE (PF) 2 MG/ML IV SOLN: 2.0000 mg | INTRAVENOUS | Status: DC | PRN

## 2014-12-23 MED ORDER — DRONABINOL 2.5 MG PO CAPS
2.5000 mg | ORAL_CAPSULE | Freq: Two times a day (BID) | ORAL | Status: DC
  Administered 2014-12-23 (×2): 2.5 mg via ORAL
  Filled 2014-12-23 (×2): qty 1

## 2014-12-23 MED ORDER — ONDANSETRON HCL 4 MG PO TABS: 4.0000 mg | ORAL_TABLET | Freq: Four times a day (QID) | ORAL | Status: DC | PRN

## 2014-12-23 MED ORDER — DEXTROSE 5 % IV SOLN: 1.0000 g | Freq: Once | INTRAVENOUS | Status: DC

## 2014-12-23 MED ORDER — OXYCODONE HCL ER 20 MG PO T12A
20.0000 mg | EXTENDED_RELEASE_TABLET | Freq: Two times a day (BID) | ORAL | Status: DC
  Administered 2014-12-23 – 2014-12-24 (×3): 20 mg via ORAL
  Filled 2014-12-23 (×3): qty 1

## 2014-12-23 MED ORDER — SODIUM CHLORIDE 0.9 % IJ SOLN
3.0000 mL | Freq: Two times a day (BID) | INTRAMUSCULAR | Status: DC
  Administered 2014-12-23 – 2014-12-24 (×2): 3 mL via INTRAVENOUS

## 2014-12-23 MED ORDER — SODIUM CHLORIDE 0.9 % IV SOLN
INTRAVENOUS | Status: DC
  Administered 2014-12-23 (×2): via INTRAVENOUS

## 2014-12-23 MED ORDER — FUROSEMIDE 20 MG PO TABS
20.0000 mg | ORAL_TABLET | Freq: Every day | ORAL | Status: DC
  Administered 2014-12-23 – 2014-12-24 (×2): 20 mg via ORAL
  Filled 2014-12-23 (×2): qty 1

## 2014-12-23 MED ORDER — ONDANSETRON HCL 4 MG/2ML IJ SOLN: 4.0000 mg | Freq: Four times a day (QID) | INTRAMUSCULAR | Status: DC | PRN

## 2014-12-23 MED ORDER — ACETAMINOPHEN 500 MG PO TABS
1000.0000 mg | ORAL_TABLET | Freq: Once | ORAL | Status: AC
  Administered 2014-12-23: 1000 mg via ORAL
  Filled 2014-12-23: qty 2

## 2014-12-23 NOTE — Progress Notes (Signed)
ANTIBIOTIC CONSULT NOTE - INITIAL  Pharmacy Consult for vancomycin Indication: urosepsis  Allergies  Allergen Reactions  . Fentanyl Other (See Comments)    "loopy and confused"    Patient Measurements: Height: 5\' 4"  (162.6 cm) Weight: 112 lb (50.803 kg) IBW/kg (Calculated) : 54.7 Adjusted Body Weight: 50.8 kg  Vital Signs: Temp: 97.5 F (36.4 C) (12/21 0348) Temp Source: Oral (12/21 0348) BP: 135/70 mmHg (12/21 0348) Pulse Rate: 79 (12/21 0348) Intake/Output from previous day:   Intake/Output from this shift:    Labs:  Recent Labs  12/22/14 1903  WBC 9.3  HGB 12.6  PLT 141*  CREATININE 0.79   Estimated Creatinine Clearance: 56.2 mL/min (by C-G formula based on Cr of 0.79). No results for input(s): VANCOTROUGH, VANCOPEAK, VANCORANDOM, GENTTROUGH, GENTPEAK, GENTRANDOM, TOBRATROUGH, TOBRAPEAK, TOBRARND, AMIKACINPEAK, AMIKACINTROU, AMIKACIN in the last 72 hours.   Microbiology: No results found for this or any previous visit (from the past 720 hour(s)).  Medical History: Past Medical History  Diagnosis Date  . IBS (irritable bowel syndrome)   . Cancer ([place]) 2015    breast and bone  . Breast cancer ([place])     right  . 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 COPD (chronic obstructive pulmonary disease) ([facility])     Medications:  Infusions:  . sodium chloride     Assessment: 64 yof cc emesis with fever reached peak of 104 this afternoon. On chemotherapy. ED starts treatment for urosepsis.   Vd 35.6 L, Ke 0.052 hr-1, T1/2 13.4 hr  Goal of Therapy:  Vancomycin trough level 15-20 mcg/ml  Plan:  Expected duration 5 days with resolution of temperature and/or normalization of WBC. Vancomycin 1 gm IV Q18H with stacked dosing, second dose approximately 13 hours after first, predicted trough 18 mcg/mL. Pharmacy will continue to follow and adjust as needed to maintain trough 15 to 20 mcg/mL.  Laural Benes, Pharm.D., BCPS Clinical Pharmacist 12/23/2014,3:54 AM

## 2014-12-23 NOTE — Plan of Care (Signed)
Problem: Safety: Goal: Ability to remain free from injury will improve Outcome: Progressing Moderate fall risk. Up with standby assist to BR  Problem: Health Behavior/Discharge Planning: Goal: Ability to manage health-related needs will improve Outcome: Progressing Pt from home with family. Last chemo a week ago   Problem: Pain Managment: Goal: General experience of comfort will improve Outcome: Progressing Pt denies pain. Warm blankets applied per patient request. Lights adjusted to lowest setting as pt requested   Problem: Physical Regulation: Goal: Will remain free from infection Outcome: Progressing Hand and respiratory hygiene encouraged. Febrile at home. Tylenol given in the ED with improvement. No fever since admitted. IV antibiotics -Vancomycin and Rocephin. WBC count wnl.   Problem: Skin Integrity: Goal: Risk for impaired skin integrity will decrease Outcome: Progressing Skin integrity intact. Old scarring to right breast. Pt said " the breast cancer ate my breast off". Tiny abrasions to bilateral lower extremities.   Problem: Activity: Goal: Risk for activity intolerance will decrease Outcome: Progressing Pt up to bathroom without difficulty  Problem: Fluid Volume: Goal: Ability to maintain a balanced intake and output will improve Outcome: Progressing IVF infusing at 138mls/hr. Up to bathroom x 2 since admitted this am.   Problem: Nutrition: Goal: Adequate nutrition will be maintained Outcome: Progressing Heart healthy diet ordered. Pt declined anything to eat upon admission. Prefers water without ice.   Problem: Bowel/Gastric: Goal: Will not experience complications related to bowel motility Outcome: Progressing Last bm today per patient, prior to admission.

## 2014-12-23 NOTE — ED Notes (Signed)
Admitting, Dr. Marcille Blanco at bedside.

## 2014-12-23 NOTE — H&P (Signed)
Elizabeth Bond is an 64 y.o. female.   Chief Complaint: Fever HPI: The patient presents to the emergency department complaining of fever. She states that she felt sweaty and cold last night. She got under an electric blanket but did not feel any better. She denies nausea or vomiting. She took her temperature which was 104.23F. Thinking that the fever may be environmental she moved to blanket and changed close at which time her temperature was still 103.3F. This prompted her to seek evaluation in the emergency department where she was found to have urinary tract infection and be criteria for sepsis. This problem to the emergency department staff to call for admission.  Past Medical History  Diagnosis Date  . IBS (irritable bowel syndrome)   . Cancer ([place]) 2015    breast and bone  . Breast cancer ([place])     right  . 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 COPD (chronic obstructive pulmonary disease) [facility])     Past Surgical History  Procedure Laterality Date  . Replacement total knee Left 2004-2005?  Marland Kitchen Abdominal hysterectomy  1978  . Portacath placement  2015    Family History  Problem Relation Age of Onset  . Cancer Father     prostate  . Cancer Maternal Aunt     breast  . Cancer Cousin     breast  . Cancer Other     lung cancer - neice  . Cancer - Other Daughter     brain  . Cancer Mother     Pancreatic   Social History:  reports that she quit smoking about 20 months ago. Her smoking use included Cigarettes. She has a 10 pack-year smoking history. She has never used smokeless tobacco. She reports that she does not drink alcohol or use illicit drugs.  Allergies:  Allergies  Allergen Reactions  . Fentanyl Other (See Comments)    "loopy and confused"    Prior to Admission medications   Medication Sig Start Date End Date Taking? Authorizing Provider  acetaminophen (TYLENOL) 500 MG tablet Take 1,000 mg by mouth every 8 (eight) hours as needed for headache.   Yes Historical  Provider, MD  calcium carbonate (OS-CAL) 600 MG TABS tablet Take 1 tablet (600 mg total) by mouth 2 (two) times daily with a meal. 11/24/14  Yes Lequita Asal, MD  clonazePAM (KLONOPIN) 0.5 MG tablet Take 1 tablet (0.5 mg total) by mouth 3 (three) times daily as needed for anxiety. 10/12/14  Yes Lequita Asal, MD  furosemide (LASIX) 20 MG tablet TAKE ONE TABLET BY MOUTH EVERY OTHER DAYAS NEEDED FOR SWELLING 12/21/14  Yes Evlyn Kanner, NP  ondansetron (ZOFRAN) 4 MG tablet Take 1 tablet (4 mg total) by mouth every 8 (eight) hours as needed for nausea or vomiting. 09/15/14  Yes Lequita Asal, MD  oxycodone (OXY-IR) 5 MG capsule Take 1 -2 tablets every 4 hours by mouth as needed Patient taking differently: Take 5-10 mg by mouth every 4 (four) hours as needed for pain.  09/15/14  Yes Lequita Asal, MD  oxyCODONE (OXYCONTIN) 20 mg 12 hr tablet Take 1 tablet (20 mg total) by mouth every 12 (twelve) hours. 12/18/14  Yes Lequita Asal, MD  pantoprazole (PROTONIX) 40 MG tablet Take 1 tablet (40 mg total) by mouth daily. 10/12/14  Yes Lequita Asal, MD  phosphorus (PHOSPHA 250 NEUTRAL) 155-852-130 MG tablet Take 1 tablet (250 mg total) by mouth 2 (two) times daily. 12/18/14  Yes  Lequita Asal, MD  potassium chloride (K-DUR) 10 MEQ tablet Take 1 tablet (10 mEq total) by mouth daily. 10/12/14  Yes Lequita Asal, MD  furosemide (LASIX) 20 MG tablet 1 tab by mouth every other day as needed for edema Patient not taking: Reported on 12/22/2014 11/24/14   Lequita Asal, MD     Results for orders placed or performed during the hospital encounter of 12/22/14 (from the past 48 hour(s))  Lipase, blood     Status: None   Collection Time: 12/22/14  7:03 PM  Result Value Ref Range   Lipase 15 11 - 51 U/L  Comprehensive metabolic panel     Status: Abnormal   Collection Time: 12/22/14  7:03 PM  Result Value Ref Range   Sodium 134 (L) 135 - 145 mmol/L   Potassium 3.5 3.5 -  5.1 mmol/L   Chloride 97 (L) 101 - 111 mmol/L   CO2 27 22 - 32 mmol/L   Glucose, Bld 104 (H) 65 - 99 mg/dL   BUN 14 6 - 20 mg/dL   Creatinine, Ser 0.79 0.44 - 1.00 mg/dL   Calcium 10.5 (H) 8.9 - 10.3 mg/dL   Total Protein 9.1 (H) 6.5 - 8.1 g/dL   Albumin 3.6 3.5 - 5.0 g/dL   AST 68 (H) 15 - 41 U/L   ALT 25 14 - 54 U/L   Alkaline Phosphatase 70 38 - 126 U/L   Total Bilirubin 0.4 0.3 - 1.2 mg/dL   GFR calc non Af Amer >60 >60 mL/min   GFR calc Af Amer >60 >60 mL/min    Comment: (NOTE) The eGFR has been calculated using the CKD EPI equation. This calculation has not been validated in all clinical situations. eGFR's persistently <60 mL/min signify possible Chronic Kidney Disease.    Anion gap 10 5 - 15  CBC     Status: Abnormal   Collection Time: 12/22/14  7:03 PM  Result Value Ref Range   WBC 9.3 3.6 - 11.0 K/uL   RBC 4.15 3.80 - 5.20 MIL/uL   Hemoglobin 12.6 12.0 - 16.0 g/dL   HCT 37.3 35.0 - 47.0 %   MCV 89.9 80.0 - 100.0 fL   MCH 30.4 26.0 - 34.0 pg   MCHC 33.8 32.0 - 36.0 g/dL   RDW 14.9 (H) 11.5 - 14.5 %   Platelets 141 (L) 150 - 440 K/uL  Urinalysis complete, with microscopic ([facility] only)     Status: Abnormal   Collection Time: 12/22/14  7:07 PM  Result Value Ref Range   Color, Urine AMBER (A) YELLOW   APPearance TURBID (A) CLEAR   Glucose, UA NEGATIVE NEGATIVE mg/dL   Bilirubin Urine NEGATIVE NEGATIVE   Ketones, ur TRACE (A) NEGATIVE mg/dL   Specific Gravity, Urine 1.019 1.005 - 1.030   Hgb urine dipstick NEGATIVE NEGATIVE   pH 5.0 5.0 - 8.0   Protein, ur >500 (A) NEGATIVE mg/dL   Nitrite NEGATIVE NEGATIVE   Leukocytes, UA 3+ (A) NEGATIVE   RBC / HPF 6-30 0 - 5 RBC/hpf   WBC, UA TOO NUMEROUS TO COUNT 0 - 5 WBC/hpf   Bacteria, UA MANY (A) NONE SEEN   Squamous Epithelial / LPF 0-5 (A) NONE SEEN   WBC Clumps PRESENT    Mucous PRESENT   Lactic acid, plasma     Status: None   Collection Time: 12/22/14 10:51 PM  Result Value Ref Range   Lactic Acid, Venous 0.7  0.5 - 2.0 mmol/L  Lactic acid, plasma     Status: None   Collection Time: 12/23/14  1:24 AM  Result Value Ref Range   Lactic Acid, Venous 0.7 0.5 - 2.0 mmol/L   Dg Chest 2 View  12/22/2014  CLINICAL DATA:  64 year old female with cough and vomiting. History of metastatic breast cancer. EXAM: CHEST  2 VIEW COMPARISON:  Chest radiograph dated 07/28/2013 and head CT dated 02/23/2014 FINDINGS: Two views of the chest do not demonstrate any focal consolidation. There is no pleural effusion or pneumothorax. The cardiac silhouette is within normal limits. Left pectoral Port-A-Cath with tip at the cavoatrial junction. The bones are osteopenic. There is mid thoracic compression fracture with anterior wedging as seen on the prior PET. IMPRESSION: No focal consolidation. Electronically Signed   By: Anner Crete M.D.   On: 12/22/2014 22:33    Review of Systems  Constitutional: Positive for fever and chills.       Decreased appetite  HENT: Negative for sore throat and tinnitus.   Eyes: Negative for blurred vision and redness.  Respiratory: Negative for cough and shortness of breath.   Cardiovascular: Negative for chest pain, palpitations, orthopnea and PND.  Gastrointestinal: Negative for nausea, vomiting, abdominal pain and diarrhea.  Genitourinary: Negative for dysuria, urgency and frequency.  Musculoskeletal: Negative for myalgias and joint pain.  Skin: Negative for rash.       No lesions  Neurological: Negative for speech change, focal weakness and weakness.  Endo/Heme/Allergies: Does not bruise/bleed easily.       No temperature intolerance  Psychiatric/Behavioral: Negative for depression and suicidal ideas.    Blood pressure 121/67, pulse 88, temperature 98.6 F (37 C), temperature source Oral, resp. rate 22, height 5' 4"  (1.626 m), weight 50.803 kg (112 lb), SpO2 96 %. Physical Exam  Vitals reviewed. Constitutional: She is oriented to person, place, and time. She appears well-developed  and well-nourished. No distress.  HENT:  Head: Normocephalic and atraumatic.  Mouth/Throat: Oropharynx is clear and moist.  Eyes: Conjunctivae and EOM are normal. Pupils are equal, round, and reactive to light. No scleral icterus.  Neck: Normal range of motion. Neck supple. No JVD present. No tracheal deviation present. No thyromegaly present.  Cardiovascular: Normal rate, regular rhythm and normal heart sounds.  Exam reveals no gallop and no friction rub.   No murmur heard. Respiratory: Effort normal and breath sounds normal.  Port in place left chest  GI: Soft. Bowel sounds are normal. She exhibits no distension. There is no tenderness.  Genitourinary:  Deferred  Musculoskeletal: Normal range of motion. She exhibits no edema.  Lymphadenopathy:    She has no cervical adenopathy.  Neurological: She is alert and oriented to person, place, and time. No cranial nerve deficit. She exhibits normal muscle tone.  Skin: Skin is warm and dry.  Psychiatric: She has a normal mood and affect. Her behavior is normal. Judgment and thought content normal.     Assessment/Plan This is a 64 year old Caucasian female undergoing chemotherapy for breast cancer admitted for sepsis secondary to urinary tract infection. 1. Sepsis: The patient meets criteria via fever, tachycardia and tachypnea. She is hemodynamically stable. Blood cultures and urine culture have been obtained in the emergency department and we will follow both for growth and sensitivities. The patient has been started on broad-spectrum antibiotics. 2. Urinary tract infection: Antibiotic treatment as above 3. COPD: Patient's lungs are clear. Supplement oxygen as needed daily at bedtime 4. Metastatic breast cancer: Patient is not neutropenic. Her pain is well  managed. Her appetite is significantly decreased and I have started her on Marinol.  5. DVT prophylaxis: Heparin 6. GI prophylaxis: Pantoprazole The patient is a full code. Time spent on  admission orders and patient care proximally 45 minutes  Harrie Foreman 12/23/2014, 2:43 AM

## 2014-12-23 NOTE — Progress Notes (Signed)
[place] [facility] [place] NAME: Elizabeth Bond    MR#:  [id number]  DATE OF BIRTH:  May 31, 1950  SUBJECTIVE:  CHIEF COMPLAINT:   Chief Complaint  Patient presents with  . Emesis   no fever or chills. No dysuria or hematuria.  REVIEW OF SYSTEMS:  CONSTITUTIONAL: No fever, fatigue or weakness.  EYES: No blurred or double vision.  EARS, NOSE, AND THROAT: No tinnitus or ear pain.  RESPIRATORY: No cough, shortness of breath, wheezing or hemoptysis.  CARDIOVASCULAR: No chest pain, orthopnea, edema.  GASTROINTESTINAL: No nausea, vomiting, diarrhea or abdominal pain.  GENITOURINARY: No dysuria, hematuria.  ENDOCRINE: No polyuria, nocturia,  HEMATOLOGY: No anemia, easy bruising or bleeding SKIN: No rash or lesion. MUSCULOSKELETAL: No joint pain or arthritis.   NEUROLOGIC: No tingling, numbness, weakness.  PSYCHIATRY: No anxiety or depression.   DRUG ALLERGIES:   Allergies  Allergen Reactions  . Fentanyl Other (See Comments)    "loopy and confused"    VITALS:  Blood pressure 110/86, pulse 94, temperature 99.2 F (37.3 C), temperature source Oral, resp. rate 20, height 5\' 4"  (1.626 m), weight 50.803 kg (112 lb), SpO2 90 %.  PHYSICAL EXAMINATION:  GENERAL:  64 y.o.-year-old patient lying in the bed with no acute distress.  EYES: Pupils equal, round, reactive to light and accommodation. No scleral icterus. Extraocular muscles intact.  HEENT: Head atraumatic, normocephalic. Oropharynx and nasopharynx clear.  NECK:  Supple, no jugular venous distention. No thyroid enlargement, no tenderness.  LUNGS: Normal breath sounds bilaterally, no wheezing, rales,rhonchi or crepitation. No use of accessory muscles of respiration.  CARDIOVASCULAR: S1, S2 normal. No murmurs, rubs, or gallops.  ABDOMEN: Soft, nontender, nondistended. Bowel sounds present. No organomegaly or mass.  EXTREMITIES: No pedal edema, cyanosis, or clubbing.  NEUROLOGIC: Cranial  nerves II through XII are intact. Muscle strength 5/5 in all extremities. Sensation intact. Gait not checked.  PSYCHIATRIC: The patient is alert and oriented x 3.  SKIN: No obvious rash, lesion, or ulcer.    LABORATORY PANEL:   CBC  Recent Labs Lab 12/22/14 1903  WBC 9.3  HGB 12.6  HCT 37.3  PLT 141*   ------------------------------------------------------------------------------------------------------------------  Chemistries   Recent Labs Lab 12/22/14 1903  NA 134*  K 3.5  CL 97*  CO2 27  GLUCOSE 104*  BUN 14  CREATININE 0.79  CALCIUM 10.5*  AST 68*  ALT 25  ALKPHOS 70  BILITOT 0.4   ------------------------------------------------------------------------------------------------------------------  Cardiac Enzymes No results for input(s): TROPONINI in the last 168 hours. ------------------------------------------------------------------------------------------------------------------  RADIOLOGY:  Dg Chest 2 View  12/22/2014  CLINICAL DATA:  64 year old female with cough and vomiting. History of metastatic breast cancer. EXAM: CHEST  2 VIEW COMPARISON:  Chest radiograph dated 07/28/2013 and head CT dated 02/23/2014 FINDINGS: Two views of the chest do not demonstrate any focal consolidation. There is no pleural effusion or pneumothorax. The cardiac silhouette is within normal limits. Left pectoral Port-A-Cath with tip at the cavoatrial junction. The bones are osteopenic. There is mid thoracic compression fracture with anterior wedging as seen on the prior PET. IMPRESSION: No focal consolidation. Electronically Signed   By: Anner Crete M.D.   On: 12/22/2014 22:33    EKG:   Orders placed or performed in visit on 03/16/13  . EKG 12-Lead    ASSESSMENT AND PLAN:   This is a 64 year old Caucasian female undergoing chemotherapy for breast cancer admitted for sepsis secondary to urinary tract infection.  1. UTI with Sepsis:  The patient meets criteria via fever,  tachycardia and tachypnea.  Continue Rocephin. Discontinue vancomycin. Follow-up Blood cultures are negative so far and urine culture (E Coli). Follow-up sensitivity.  2. COPD: Stable. 3. Metastatic breast cancer. Follow-up oncologist as outpatient.  * Hyponatremia.  All the records are reviewed and case discussed with Care Management/Social Workerr. Management plans discussed with the patient, her daughter and sister and they are in agreement.  CODE STATUS: Full code  TOTAL TIME TAKING CARE OF THIS PATIENT: 37 minutes.  Greater than 50% time was spent on coordination of care and face-to-face counseling.  POSSIBLE D/C IN 2 DAYS, DEPENDING ON CLINICAL CONDITION.   Demetrios Loll M.D on 12/23/2014 at 4:44 PM  Between 7am to 6pm - Pager - [phone]  After 6pm go to [email or web address] - password [email or web address] [facility]  Office  [phone]  CC: Primary care physician; Vista Mink, FNP

## 2014-12-23 NOTE — Progress Notes (Signed)
Patient spiked a fever of 102.4 around 1700. She received 1000mg  of tylenol and temp was found to be 102.5 an hour later. MD was called and a one time dose of ibuprofen 600mg  PO was ordered. I went to give the patient the dose and she stated that her previous oncologist recommended that she not take ibuprofen, but her current oncologist has not limited her use of ibuprofen or other NSAIDs. She denies any history of allergy to ibuprofen or a history of stomach ulcers. According to the pharmacist there are no known drug reactions between the chemotherapy the patient is currently on and ibuprofen. Her last CBC drawn on 12/20 showed a slightly low platelet count, but otherwise normal. She is not exhibiting any signs of bleeding. Ibuprofen administered to treat her fever.

## 2014-12-23 NOTE — ED Provider Notes (Signed)
Time Seen: Approximately 2040  I have reviewed the triage notes  Chief Complaint: Emesis   History of Present Illness: Elizabeth Bond is a 64 y.o. female *who presents with recent onset of a fever which started last night. Patient states her temperature at home was mostly low-grade but reached a peak of 104 at approximately 5 PM this afternoon. Patient's currently receiving chemotherapy for breast cancer with metastatic disease to the bone. She's had a dry nonproductive productive cough she denies any dysuria hematuria urinary frequency. She denies any abdominal pain. She has had an episode of some vomiting last night.   Past Medical History  Diagnosis Date  . IBS (irritable bowel syndrome)   . Cancer ([place]) 2015    breast and bone  . Breast cancer ([place])     right  . Metastasis to bone ([place]) 09/01/2014  . COPD (chronic obstructive pulmonary disease) [facility])     Patient Active Problem List   Diagnosis Date Noted  . Metastasis to bone ([place]) 09/01/2014  . Breast cancer ([place]) 06/16/2013    Past Surgical History  Procedure Laterality Date  . Replacement total knee Left 2004-2005?  Marland Kitchen Abdominal hysterectomy  1978  . Portacath placement  2015    Past Surgical History  Procedure Laterality Date  . Replacement total knee Left 2004-2005?  Marland Kitchen Abdominal hysterectomy  1978  . Portacath placement  2015    Current Outpatient Rx  Name  Route  Sig  Dispense  Refill  . acetaminophen (TYLENOL) 500 MG tablet   Oral   Take 1,000 mg by mouth every 8 (eight) hours as needed for headache.         . calcium carbonate (OS-CAL) 600 MG TABS tablet   Oral   Take 1 tablet (600 mg total) by mouth 2 (two) times daily with a meal.   60 tablet   3   . clonazePAM (KLONOPIN) 0.5 MG tablet   Oral   Take 1 tablet (0.5 mg total) by mouth 3 (three) times daily as needed for anxiety.   90 tablet   2   . furosemide (LASIX) 20 MG tablet      TAKE ONE TABLET BY MOUTH EVERY OTHER DAYAS NEEDED FOR  SWELLING   30 tablet   1   . ondansetron (ZOFRAN) 4 MG tablet   Oral   Take 1 tablet (4 mg total) by mouth every 8 (eight) hours as needed for nausea or vomiting.   20 tablet   1   . oxycodone (OXY-IR) 5 MG capsule      Take 1 -2 tablets every 4 hours by mouth as needed Patient taking differently: Take 5-10 mg by mouth every 4 (four) hours as needed for pain.    60 capsule   0   . oxyCODONE (OXYCONTIN) 20 mg 12 hr tablet   Oral   Take 1 tablet (20 mg total) by mouth every 12 (twelve) hours.   60 tablet   0   . pantoprazole (PROTONIX) 40 MG tablet   Oral   Take 1 tablet (40 mg total) by mouth daily.   30 tablet   6   . phosphorus (PHOSPHA 250 NEUTRAL) 155-852-130 MG tablet   Oral   Take 1 tablet (250 mg total) by mouth 2 (two) times daily.   60 tablet   2   . potassium chloride (K-DUR) 10 MEQ tablet   Oral   Take 1 tablet (10 mEq total) by mouth daily.  30 tablet   2   . furosemide (LASIX) 20 MG tablet      1 tab by mouth every other day as needed for edema Patient not taking: Reported on 12/22/2014   30 tablet   2     Allergies:  Fentanyl  Family History: Family History  Problem Relation Age of Onset  . Cancer Father     prostate  . Cancer Maternal Aunt     breast  . Cancer Cousin     breast  . Cancer Other     lung cancer - neice  . Cancer - Other Daughter     brain  . Cancer Mother     Pancreatic    Social History: Social History  Substance Use Topics  . Smoking status: Former Smoker -- 0.25 packs/day for 40 years    Types: Cigarettes    Quit date: 04/02/2013  . Smokeless tobacco: Never Used  . Alcohol Use: No     Review of Systems:   10 point review of systems was performed and was otherwise negative:  Constitutional: Fever mentioned above Eyes: No visual disturbances ENT: No sore throat, ear pain Cardiac: No chest pain Respiratory: No shortness of breath, wheezing, or stridor Abdomen: No abdominal pain, no vomiting, No  diarrhea Endocrine: No weight loss, No night sweats Extremities: No peripheral edema, cyanosis Skin: No rashes, easy bruising Neurologic: No focal weakness, trouble with speech or swollowing Urologic: No dysuria, Hematuria, or urinary frequency Patient has a left-sided port which is been working  Physical Exam:  ED Triage Vitals  Enc Vitals Group     BP 12/22/14 1900 151/79 mmHg     Pulse Rate 12/22/14 1900 129     Resp 12/22/14 1900 20     Temp 12/22/14 1900 99.1 F (37.3 C)     Temp Source 12/22/14 1900 Oral     SpO2 12/22/14 1900 88 %     Weight 12/22/14 1900 112 lb (50.803 kg)     Height 12/22/14 1900 5\' 4"  (1.626 m)     Head Cir --      Peak Flow --      Pain Score 12/22/14 2114 0     Pain Loc --      Pain Edu? --      Excl. in [place]? --     General: Awake , Alert , and Oriented times 3; GCS 15 Head: Normal cephalic , atraumatic Eyes: Pupils equal , round, reactive to light Nose/Throat: No nasal drainage, patent upper airway without erythema or exudate.  Neck: Supple, Full range of motion, No anterior adenopathy or palpable thyroid masses Lungs: Diminished breath sounds at the left base without any wheezes rhonchi's Rales Heart: Regular rate, regular rhythm without murmurs , gallops , or rubs Abdomen: Soft, non tender without rebound, guarding , or rigidity; bowel sounds positive and symmetric in all 4 quadrants. No organomegaly .        Extremities: 2 plus symmetric pulses. No edema, clubbing or cyanosis Neurologic: normal ambulation, Motor symmetric without deficits, sensory intact Skin: warm, dry, no rashes Left-sided port without any erythema or exudate or drainage  Labs:   All laboratory work was reviewed including any pertinent negatives or positives listed below:  Labs Reviewed  COMPREHENSIVE METABOLIC PANEL - Abnormal; Notable for the following:    Sodium 134 (*)    Chloride 97 (*)    Glucose, Bld 104 (*)    Calcium 10.5 (*)    Total Protein  9.1 (*)     AST 68 (*)    All other components within normal limits  CBC - Abnormal; Notable for the following:    RDW 14.9 (*)    Platelets 141 (*)    All other components within normal limits  URINALYSIS COMPLETEWITH MICROSCOPIC ([facility] ONLY) - Abnormal; Notable for the following:    Color, Urine AMBER (*)    APPearance TURBID (*)    Ketones, ur TRACE (*)    Protein, ur >500 (*)    Leukocytes, UA 3+ (*)    Bacteria, UA MANY (*)    Squamous Epithelial / LPF 0-5 (*)    All other components within normal limits  CULTURE, BLOOD (ROUTINE X 2)  CULTURE, BLOOD (ROUTINE X 2)  URINE CULTURE  LIPASE, BLOOD  LACTIC ACID, PLASMA  LACTIC ACID, PLASMA   Review of laboratory work showed findings consistent with a urinary tract infection Blood and urine cultures are pending  Radiology:    CLINICAL DATA: 64 year old female with cough and vomiting. History of metastatic breast cancer.  EXAM: CHEST 2 VIEW  COMPARISON: Chest radiograph dated 07/28/2013 and head CT dated 02/23/2014  FINDINGS: Two views of the chest do not demonstrate any focal consolidation. There is no pleural effusion or pneumothorax. The cardiac silhouette is within normal limits. Left pectoral Port-A-Cath with tip at the cavoatrial junction. The bones are osteopenic. There is mid thoracic compression fracture with anterior wedging as seen on the prior PET.  IMPRESSION: No focal consolidation. I personally reviewed the radiologic studies     ED Course: * Patient's stay here was uneventful and she does not appear to be septic at this time though with her immunocompromise state. Is concerned with the infection and fever. Patient was started on IV Rocephin here in emergency department. She has been pancultured with of course these results pending at this time.    Assessment: * Fever Urinary tract infection Chemotherapy**   Final Clinical Impression: *  Final diagnoses:  Acute UTI (urinary tract infection)      Plan: Inpatient management             Daymon Larsen, MD 12/23/14 0102

## 2014-12-24 LAB — MAGNESIUM: MAGNESIUM: 1.5 mg/dL — AB (ref 1.7–2.4)

## 2014-12-24 LAB — PHOSPHORUS: PHOSPHORUS: 3.2 mg/dL (ref 2.5–4.6)

## 2014-12-24 MED ORDER — DRONABINOL 2.5 MG PO CAPS: 2.5000 mg | ORAL_CAPSULE | Freq: Two times a day (BID) | ORAL | Status: DC

## 2014-12-24 MED ORDER — MAGNESIUM SULFATE 2 GM/50ML IV SOLN
2.0000 g | Freq: Once | INTRAVENOUS | Status: AC
  Administered 2014-12-24: 10:00:00 2 g via INTRAVENOUS
  Filled 2014-12-24: qty 50

## 2014-12-24 MED ORDER — AMOXICILLIN-POT CLAVULANATE 875-125 MG PO TABS: 1.0000 | ORAL_TABLET | Freq: Two times a day (BID) | ORAL | Status: DC

## 2014-12-24 MED ORDER — HEPARIN SOD (PORK) LOCK FLUSH 100 UNIT/ML IV SOLN
500.0000 [IU] | Freq: Once | INTRAVENOUS | Status: AC
  Administered 2014-12-24: 11:00:00 500 [IU] via INTRAVENOUS
  Filled 2014-12-24: qty 5

## 2014-12-24 NOTE — Discharge Summary (Signed)
[facility] NAME: Elizabeth Bond    MR#:  [id number]  DATE OF BIRTH:  [birth year]  DATE OF ADMISSION:  12/22/2014 ADMITTING PHYSICIAN: Harrie Foreman, MD  DATE OF DISCHARGE: 12/24/2014 11:30 AM  PRIMARY CARE PHYSICIAN: Vista Mink, FNP    ADMISSION DIAGNOSIS:  Acute UTI (urinary tract infection) [N39.0]   DISCHARGE DIAGNOSIS:    SECONDARY DIAGNOSIS:   Past Medical History  Diagnosis Date  . IBS (irritable bowel syndrome)   . Cancer ([place]) 2015    breast and bone  . Breast cancer ([place])     right  . Metastasis to bone ([place]) 09/01/2014  . COPD (chronic obstructive pulmonary disease) [facility])     HOSPITAL COURSE:   This is a 64 year old Caucasian female undergoing chemotherapy for breast cancer admitted for sepsis secondary to urinary tract infection.  1. UTI with Sepsis: The patient meets criteria via fever, tachycardia and tachypnea.  She was treated with Rocephin. Discontinued vancomycin.  Follow-up Blood cultures are negative so far and urine culture (E Coli), sensitive to multiple antibiotics. Her symptoms has much improved. Patient will be given by mouth Augmentin for 5 days after discharge.  2. COPD: Stable. 3. Metastatic breast cancer. Follow-up oncologist as outpatient. 4. Hypomagnesemia. Given IV magnesium today. DISCHARGE CONDITIONS:   Stable, discharged to home today.  CONSULTS OBTAINED:     DRUG ALLERGIES:   Allergies  Allergen Reactions  . Fentanyl Other (See Comments)    "loopy and confused"    DISCHARGE MEDICATIONS:   Discharge Medication List as of 12/24/2014 10:16 AM    START taking these medications   Details  amoxicillin-clavulanate (AUGMENTIN) 875-125 MG tablet Take 1 tablet by mouth 2 (two) times daily., Starting 12/24/2014, Until Discontinued, Print    dronabinol (MARINOL) 2.5 MG capsule Take 1 capsule (2.5 mg total) by mouth 2 (two) times daily before lunch and  supper., Starting 12/24/2014, Until Discontinued, Print      CONTINUE these medications which have NOT CHANGED   Details  acetaminophen (TYLENOL) 500 MG tablet Take 1,000 mg by mouth every 8 (eight) hours as needed for headache., Until Discontinued, Historical Med    calcium carbonate (OS-CAL) 600 MG TABS tablet Take 1 tablet (600 mg total) by mouth 2 (two) times daily with a meal., Starting 11/24/2014, Until Discontinued, Normal    clonazePAM (KLONOPIN) 0.5 MG tablet Take 1 tablet (0.5 mg total) by mouth 3 (three) times daily as needed for anxiety., Starting 10/12/2014, Until Discontinued, Print    furosemide (LASIX) 20 MG tablet TAKE ONE TABLET BY MOUTH EVERY OTHER DAYAS NEEDED FOR SWELLING, Normal    ondansetron (ZOFRAN) 4 MG tablet Take 1 tablet (4 mg total) by mouth every 8 (eight) hours as needed for nausea or vomiting., Starting 09/15/2014, Until Discontinued, Normal    oxycodone (OXY-IR) 5 MG capsule Take 1 -2 tablets every 4 hours by mouth as needed, Print    oxyCODONE (OXYCONTIN) 20 mg 12 hr tablet Take 1 tablet (20 mg total) by mouth every 12 (twelve) hours., Starting 12/18/2014, Until Discontinued, Print    pantoprazole (PROTONIX) 40 MG tablet Take 1 tablet (40 mg total) by mouth daily., Starting 10/12/2014, Until Discontinued, Normal    phosphorus (PHOSPHA 250 NEUTRAL) 155-852-130 MG tablet Take 1 tablet (250 mg total) by mouth 2 (two) times daily., Starting 12/18/2014, Until Discontinued, Normal    potassium chloride (K-DUR) 10 MEQ tablet Take 1 tablet (10 mEq total) by mouth daily., Starting  10/12/2014, Until Discontinued, Normal         DISCHARGE INSTRUCTIONS:    If you experience worsening of your admission symptoms, develop shortness of breath, life threatening emergency, suicidal or homicidal thoughts you must seek medical attention immediately by calling 911 or calling your MD immediately  if symptoms less severe.  You Must read complete instructions/literature  along with all the possible adverse reactions/side effects for all the Medicines you take and that have been prescribed to you. Take any new Medicines after you have completely understood and accept all the possible adverse reactions/side effects.   Please note  You were cared for by a hospitalist during your hospital stay. If you have any questions about your discharge medications or the care you received while you were in the hospital after you are discharged, you can call the unit and asked to speak with the hospitalist on call if the hospitalist that took care of you is not available. Once you are discharged, your primary care physician will handle any further medical issues. Please note that NO REFILLS for any discharge medications will be authorized once you are discharged, as it is imperative that you return to your primary care physician (or establish a relationship with a primary care physician if you do not have one) for your aftercare needs so that they can reassess your need for medications and monitor your lab values.    Today   SUBJECTIVE    No complaint.  VITAL SIGNS:  Blood pressure 135/85, pulse 96, temperature 98.1 F (36.7 C), temperature source Oral, resp. rate 17, height 5\' 4"  (1.626 m), weight 56.291 kg (124 lb 1.6 oz), SpO2 96 %.  I/O:   Intake/Output Summary (Last 24 hours) at 12/24/14 1700 Last data filed at 12/24/14 0900  Gross per 24 hour  Intake    480 ml  Output      0 ml  Net    480 ml    PHYSICAL EXAMINATION:  GENERAL:  64 y.o.-year-old patient lying in the bed with no acute distress.  EYES: Pupils equal, round, reactive to light and accommodation. No scleral icterus. Extraocular muscles intact.  HEENT: Head atraumatic, normocephalic. Oropharynx and nasopharynx clear.  NECK:  Supple, no jugular venous distention. No thyroid enlargement, no tenderness.  LUNGS: Normal breath sounds bilaterally, no wheezing, rales,rhonchi or crepitation. No use of  accessory muscles of respiration.  CARDIOVASCULAR: S1, S2 normal. No murmurs, rubs, or gallops.  ABDOMEN: Soft, non-tender, non-distended. Bowel sounds present. No organomegaly or mass.  EXTREMITIES: No pedal edema, cyanosis, or clubbing.  NEUROLOGIC: Cranial nerves II through XII are intact. Muscle strength 5/5 in all extremities. Sensation intact. Gait not checked.  PSYCHIATRIC: The patient is alert and oriented x 3.  SKIN: No obvious rash, lesion, or ulcer.   DATA REVIEW:   CBC  Recent Labs Lab 12/22/14 1903  WBC 9.3  HGB 12.6  HCT 37.3  PLT 141*    Chemistries   Recent Labs Lab 12/22/14 1903 12/24/14 0523  NA 134*  --   K 3.5  --   CL 97*  --   CO2 27  --   GLUCOSE 104*  --   BUN 14  --   CREATININE 0.79  --   CALCIUM 10.5*  --   MG  --  1.5*  AST 68*  --   ALT 25  --   ALKPHOS 70  --   BILITOT 0.4  --     Cardiac Enzymes No results  for input(s): TROPONINI in the last 168 hours.  Microbiology Results  Results for orders placed or performed during the hospital encounter of 12/22/14  Urine culture     Status: None (Preliminary result)   Collection Time: 12/22/14  7:07 PM  Result Value Ref Range Status   Specimen Description URINE, CLEAN CATCH  Final   Special Requests NONE  Final   Culture >=100,000 COLONIES/mL ESCHERICHIA COLI  Final   Report Status PENDING  Incomplete   Organism ID, Bacteria ESCHERICHIA COLI  Final      Susceptibility   Escherichia coli - MIC*    AMPICILLIN <=2 SENSITIVE Sensitive     CEFAZOLIN <=4 SENSITIVE Sensitive     CEFTRIAXONE <=1 SENSITIVE Sensitive     CIPROFLOXACIN <=0.25 SENSITIVE Sensitive     GENTAMICIN <=1 SENSITIVE Sensitive     IMIPENEM <=0.25 SENSITIVE Sensitive     NITROFURANTOIN <=16 SENSITIVE Sensitive     TRIMETH/SULFA <=20 SENSITIVE Sensitive     Extended ESBL NEGATIVE Sensitive     PIP/TAZO Value in next row Sensitive      SENSITIVE<=4    ERTAPENEM Value in next row Sensitive      SENSITIVE<=0.5     LEVOFLOXACIN Value in next row Sensitive      SENSITIVE<=0.12    * >=100,000 COLONIES/mL ESCHERICHIA COLI  Culture, blood (Routine X 2) w Reflex to ID Panel     Status: None (Preliminary result)   Collection Time: 12/22/14 10:51 PM  Result Value Ref Range Status   Specimen Description BLOOD LEFT PORTA CATH  Final   Special Requests BOTTLES DRAWN AEROBIC AND ANAEROBIC 10ML  Final   Culture NO GROWTH < 24 HOURS  Final   Report Status PENDING  Incomplete  Culture, blood (Routine X 2) w Reflex to ID Panel     Status: None (Preliminary result)   Collection Time: 12/22/14 10:51 PM  Result Value Ref Range Status   Specimen Description BLOOD RIGHT ANTECUBITAL  Final   Special Requests BOTTLES DRAWN AEROBIC AND ANAEROBIC 5ML  Final   Culture NO GROWTH < 12 HOURS  Final   Report Status PENDING  Incomplete    RADIOLOGY:  Dg Chest 2 View  12/22/2014  CLINICAL DATA:  64 year old female with cough and vomiting. History of metastatic breast cancer. EXAM: CHEST  2 VIEW COMPARISON:  Chest radiograph dated 07/28/2013 and head CT dated 02/23/2014 FINDINGS: Two views of the chest do not demonstrate any focal consolidation. There is no pleural effusion or pneumothorax. The cardiac silhouette is within normal limits. Left pectoral Port-A-Cath with tip at the cavoatrial junction. The bones are osteopenic. There is mid thoracic compression fracture with anterior wedging as seen on the prior PET. IMPRESSION: No focal consolidation. Electronically Signed   By: Anner Crete M.D.   On: 12/22/2014 22:33        Management plans discussed with the patient, her daughter and they are in agreement.  CODE STATUS:   TOTAL TIME TAKING CARE OF THIS PATIENT: 33 minutes.    Demetrios Loll M.D on 12/24/2014 at 5:00 PM  Between 7am to 6pm - Pager - [phone]  After 6pm go to [email or web address] - password [email or web address] [facility]  Office  [phone]  CC: Primary care physician; Vista Mink, FNP

## 2014-12-24 NOTE — Plan of Care (Signed)
Problem: Safety: Goal: Ability to remain free from injury will improve Outcome: Progressing Moderate fall risk, standby assist to bathroom. Steady gait.   Problem: Physical Regulation: Goal: Will remain free from infection Outcome: Progressing Hand hygiene and respiratory hygiene encouraged. Pt on IV antibiotics for sepsis. Blood and urine cultures pending. Afebrile this shift.   Problem: Activity: Goal: Risk for activity intolerance will decrease Outcome: Progressing Pt tolerated activity without difficulty  Problem: Nutrition: Goal: Adequate nutrition will be maintained Outcome: Progressing No complaints of nausea.

## 2014-12-24 NOTE — Progress Notes (Signed)
Pt discharged today, discharge instructions reviewed with patient and her daughter. Iv was removed, belongings returned to pt. She was rolled out to visitors entrance by staff.

## 2014-12-24 NOTE — Discharge Instructions (Signed)
Heart healthy diet. °Activity as tolerated. °

## 2014-12-27 LAB — URINE CULTURE: Culture: >=100000

## 2014-12-27 LAB — CULTURE, BLOOD (ROUTINE X 2)
Culture: NO GROWTH
Culture: NO GROWTH

## 2015-01-02 ENCOUNTER — Other Ambulatory Visit: Payer: Self-pay | Admitting: Hematology and Oncology

## 2015-01-05 ENCOUNTER — Inpatient Hospital Stay: Payer: Medicare Other | Attending: Hematology and Oncology

## 2015-01-05 ENCOUNTER — Inpatient Hospital Stay (HOSPITAL_BASED_OUTPATIENT_CLINIC_OR_DEPARTMENT_OTHER): Payer: Medicare Other | Admitting: Hematology and Oncology

## 2015-01-05 ENCOUNTER — Inpatient Hospital Stay: Payer: Medicare Other

## 2015-01-05 VITALS — BP 118/86 | HR 90 | Temp 97.1°F | Resp 18 | Ht 64.0 in | Wt 110.0 lb

## 2015-01-05 DIAGNOSIS — K589 Irritable bowel syndrome without diarrhea: Secondary | ICD-10-CM | POA: Diagnosis not present

## 2015-01-05 DIAGNOSIS — Z170 Estrogen receptor positive status [ER+]: Secondary | ICD-10-CM | POA: Diagnosis not present

## 2015-01-05 DIAGNOSIS — C7951 Secondary malignant neoplasm of bone: Secondary | ICD-10-CM | POA: Diagnosis not present

## 2015-01-05 DIAGNOSIS — Z79899 Other long term (current) drug therapy: Secondary | ICD-10-CM | POA: Insufficient documentation

## 2015-01-05 DIAGNOSIS — C50911 Malignant neoplasm of unspecified site of right female breast: Secondary | ICD-10-CM | POA: Diagnosis not present

## 2015-01-05 DIAGNOSIS — D890 Polyclonal hypergammaglobulinemia: Secondary | ICD-10-CM | POA: Diagnosis not present

## 2015-01-05 DIAGNOSIS — Z5112 Encounter for antineoplastic immunotherapy: Secondary | ICD-10-CM | POA: Insufficient documentation

## 2015-01-05 DIAGNOSIS — C50919 Malignant neoplasm of unspecified site of unspecified female breast: Secondary | ICD-10-CM

## 2015-01-05 DIAGNOSIS — Z87891 Personal history of nicotine dependence: Secondary | ICD-10-CM | POA: Insufficient documentation

## 2015-01-05 DIAGNOSIS — J449 Chronic obstructive pulmonary disease, unspecified: Secondary | ICD-10-CM | POA: Insufficient documentation

## 2015-01-05 LAB — COMPREHENSIVE METABOLIC PANEL
ALT: 16 U/L (ref 14–54)
AST: 35 U/L (ref 15–41)
Albumin: 3.6 g/dL (ref 3.5–5.0)
Alkaline Phosphatase: 49 U/L (ref 38–126)
Anion gap: 7 (ref 5–15)
BUN: 17 mg/dL (ref 6–20)
CO2: 28 mmol/L (ref 22–32)
Calcium: 9.6 mg/dL (ref 8.9–10.3)
Chloride: 98 mmol/L — ABNORMAL LOW (ref 101–111)
Creatinine, Ser: 0.93 mg/dL (ref 0.44–1.00)
GFR calc Af Amer: >60 mL/min (ref >60)
GFR calc non Af Amer: >60 mL/min (ref >60)
Glucose, Bld: 101 mg/dL — ABNORMAL HIGH (ref 65–99)
Potassium: 3.8 mmol/L (ref 3.5–5.1)
Sodium: 133 mmol/L — ABNORMAL LOW (ref 135–145)
Total Bilirubin: 0.5 mg/dL (ref 0.3–1.2)
Total Protein: 8.6 g/dL — ABNORMAL HIGH (ref 6.5–8.1)

## 2015-01-05 LAB — CBC WITH DIFFERENTIAL/PLATELET
Basophils Absolute: 0.1 10*3/uL (ref 0–0.1)
Basophils Relative: 1 %
Eosinophils Absolute: 0.1 10*3/uL (ref 0–0.7)
Eosinophils Relative: 1 %
HCT: 36.1 % (ref 35.0–47.0)
Hemoglobin: 12 g/dL (ref 12.0–16.0)
Lymphocytes Relative: 19 %
Lymphs Abs: 1.1 10*3/uL (ref 1.0–3.6)
MCH: 29.7 pg (ref 26.0–34.0)
MCHC: 33.2 g/dL (ref 32.0–36.0)
MCV: 89.5 fL (ref 80.0–100.0)
Monocytes Absolute: 0.5 10*3/uL (ref 0.2–0.9)
Monocytes Relative: 9 %
Neutro Abs: 4.1 10*3/uL (ref 1.4–6.5)
Neutrophils Relative %: 70 %
Platelets: 242 10*3/uL (ref 150–440)
RBC: 4.03 MIL/uL (ref 3.80–5.20)
RDW: 15.4 % — ABNORMAL HIGH (ref 11.5–14.5)
WBC: 5.8 10*3/uL (ref 3.6–11.0)

## 2015-01-05 MED ORDER — HEPARIN SOD (PORK) LOCK FLUSH 100 UNIT/ML IV SOLN
500.0000 [IU] | Freq: Once | INTRAVENOUS | Status: AC | PRN
  Administered 2015-01-05: 500 [IU]
  Filled 2015-01-05: qty 5

## 2015-01-05 MED ORDER — SODIUM CHLORIDE 0.9 % IV SOLN
180.0000 mg | Freq: Once | INTRAVENOUS | Status: AC
  Administered 2015-01-05: 180 mg via INTRAVENOUS
  Filled 2015-01-05: qty 9

## 2015-01-05 MED ORDER — ACETAMINOPHEN 325 MG PO TABS
650.0000 mg | ORAL_TABLET | Freq: Once | ORAL | Status: AC
  Administered 2015-01-05: 650 mg via ORAL
  Filled 2015-01-05: qty 2

## 2015-01-05 MED ORDER — DIPHENHYDRAMINE HCL 25 MG PO CAPS
50.0000 mg | ORAL_CAPSULE | Freq: Once | ORAL | Status: AC
  Administered 2015-01-05: 50 mg via ORAL
  Filled 2015-01-05: qty 2

## 2015-01-05 MED ORDER — SODIUM CHLORIDE 0.9 % IJ SOLN
10.0000 mL | INTRAMUSCULAR | Status: DC | PRN
  Administered 2015-01-05: 10 mL
  Filled 2015-01-05: qty 10

## 2015-01-05 MED ORDER — SODIUM CHLORIDE 0.9 % IV SOLN
Freq: Once | INTRAVENOUS | Status: AC
  Administered 2015-01-05: 12:00:00 via INTRAVENOUS
  Filled 2015-01-05: qty 1000

## 2015-01-05 NOTE — Progress Notes (Signed)
[facility] day:  01/05/2015   Chief Complaint: Elizabeth Bond is a 65 y.o. female with metastatic Her2/neu positive breast cancer who is seen for assessment prior to cycle #6 Kadcyla.  HPI:  The patient was last seen in the medical oncology clinic on 12/15/2014.  At that time, she received cycle #5 Kadcyla.  She tolerated her infusion well.  She was admitted to [facility] from 12/22/2014 - 12/24/2014 with sepsis secondary to a UTI.  She was treated with vancomycin and Rocephin.  Urine cultures grew E coli.  She was discharged on Augmentin for 5 additional days of antibiotics.  She denies any fever.  Symptomatically, she feels better.  She is "back to myself".  Her appetite is better on Marinol.   Past Medical History  Diagnosis Date  . IBS (irritable bowel syndrome)   . Cancer ([place]) 2015    breast and bone  . Breast cancer ([place])     right  . 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 COPD (chronic obstructive pulmonary disease) [facility])     Past Surgical History  Procedure Laterality Date  . Replacement total knee Left 2004-2005?  Marland Kitchen Abdominal hysterectomy  1978  . Portacath placement  2015    Family History  Problem Relation Age of Onset  . Cancer Father     prostate  . Cancer Maternal Aunt     breast  . Cancer Cousin     breast  . Cancer Other     lung cancer - neice  . Cancer - Other Daughter     brain  . Cancer Mother     Pancreatic    Social History:  reports that she quit smoking about 21 months ago. Her smoking use included Cigarettes. She has a 10 pack-year smoking history. She has never used smokeless tobacco. She reports that she does not drink alcohol or use illicit drugs.  The patient is alone today.  Allergies:  Allergies  Allergen Reactions  . Fentanyl Other (See Comments)    "loopy and confused"    Current Medications: Current Outpatient Prescriptions  Medication Sig Dispense Refill  . acetaminophen (TYLENOL) 500 MG  tablet Take 1,000 mg by mouth every 8 (eight) hours as needed for headache.    . calcium carbonate (OS-CAL) 600 MG TABS tablet Take 1 tablet (600 mg total) by mouth 2 (two) times daily with a meal. 60 tablet 3  . clonazePAM (KLONOPIN) 0.5 MG tablet Take 1 tablet (0.5 mg total) by mouth 3 (three) times daily as needed for anxiety. 90 tablet 2  . furosemide (LASIX) 20 MG tablet TAKE ONE TABLET BY MOUTH EVERY OTHER DAYAS NEEDED FOR SWELLING 30 tablet 1  . ondansetron (ZOFRAN) 4 MG tablet Take 1 tablet (4 mg total) by mouth every 8 (eight) hours as needed for nausea or vomiting. 20 tablet 1  . oxycodone (OXY-IR) 5 MG capsule Take 1 -2 tablets every 4 hours by mouth as needed (Patient taking differently: Take 5-10 mg by mouth every 4 (four) hours as needed for pain. ) 60 capsule 0  . oxyCODONE (OXYCONTIN) 20 mg 12 hr tablet Take 1 tablet (20 mg total) by mouth every 12 (twelve) hours. 60 tablet 0  . pantoprazole (PROTONIX) 40 MG tablet Take 1 tablet (40 mg total) by mouth daily. 30 tablet 6  . phosphorus (PHOSPHA 250 NEUTRAL) 155-852-130 MG tablet Take 1 tablet (250 mg total) by mouth 2 (two) times daily. 60 tablet 2  .  potassium chloride (K-DUR) 10 MEQ tablet Take 1 tablet (10 mEq total) by mouth daily. 30 tablet 2  . dronabinol (MARINOL) 2.5 MG capsule Take 1 capsule (2.5 mg total) by mouth 2 (two) times daily before lunch and supper. (Patient not taking: Reported on 01/05/2015) 60 capsule 0   No current facility-administered medications for this visit.   Facility-Administered Medications Ordered in Other Visits  Medication Dose Route Frequency Provider Last Rate Last Dose  . sodium chloride 0.9 % injection 10 mL  10 mL Intracatheter PRN Lequita Asal, MD   10 mL at 01/05/15 1017    Review of Systems:  GENERAL:  Feels better.  No fevers or sweats.  Weight stable. PERFORMANCE STATUS (ECOG):  0 HEENT:  No visual changes, runny nose, sore throat, mouth sores or tenderness. Lungs: No shortness of  breath or cough.  No hemoptysis. Cardiac:  No chest pain, palpitations, orthopnea, or PND. GI:  No nausea, vomiting, diarrhea, constipation, melena or hematochezia. GU:  No urgency, frequency, dysuria, or hematuria. Musculoskeletal:  No back pain.  No joint pain.  No muscle tenderness. Extremities:  No pain or swelling. Skin:  No rashes or skin changes. Neuro:  No headache, numbness or weakness, balance or coordination issues. Endocrine:  No diabetes, thyroid issues, hot flashes or night sweats. Psych:  No mood changes, depression or anxiety. Pain:  No focal pain. Review of systems:  All other systems reviewed and found to be negative.   Physical Exam: Blood pressure 118/86, pulse 90, temperature 97.1 F (36.2 C), temperature source Tympanic, resp. rate 18, height _0  (1.626 m), weight 110 lb (49.896 kg). GENERAL:  Well developed, well nourished, sitting comfortably in the exam room in no acute distress. MENTAL STATUS:  Alert and oriented to person, place and time. HEAD:  Short styled gray hair.  Normocephalic, atraumatic, face symmetric, no Cushingoid features. EYES: Hazel eyes.  Pupils equal round and reactive to light and accomodation.  No conjunctivitis or scleral icterus. ENT:  Oropharynx clear without lesion.  Tongue normal. Mucous membranes moist.  RESPIRATORY:  Clear to auscultation without rales, wheezes or rhonchi. CARDIOVASCULAR:  Regular rate and rhythm without murmur, rub or gallop.  No JVD. ABDOMEN:  Soft, non-tender, with active bowel sounds, and no hepatosplenomegaly.  No masses. SKIN:  RLQ ecchymosis/hematoma.  No rashes, ulcers or lesions. EXTREMITIES: No edema, no skin discoloration or tenderness.  No palpable cords. LYMPH NODES: No palpable cervical, supraclavicular, axillary or inguinal adenopathy  NEUROLOGICAL: Unremarkable. PSYCH:  Appropriate.  NEUROLOGICAL: Unremarkable.  Infusion on 01/05/2015  Component Date Value Ref Range Status  . WBC 01/05/2015 5.8   3.6 - 11.0 K/uL Final  . RBC 01/05/2015 4.03  3.80 - 5.20 MIL/uL Final  . Hemoglobin 01/05/2015 12.0  12.0 - 16.0 g/dL Final  . HCT 01/05/2015 36.1  35.0 - 47.0 % Final  . MCV 01/05/2015 89.5  80.0 - 100.0 fL Final  . MCH 01/05/2015 29.7  26.0 - 34.0 pg Final  . MCHC 01/05/2015 33.2  32.0 - 36.0 g/dL Final  . RDW 01/05/2015 15.4* 11.5 - 14.5 % Final  . Platelets 01/05/2015 242  150 - 440 K/uL Final  . Neutrophils Relative % 01/05/2015 70   Final  . Neutro Abs 01/05/2015 4.1  1.4 - 6.5 K/uL Final  . Lymphocytes Relative 01/05/2015 19   Final  . Lymphs Abs 01/05/2015 1.1  1.0 - 3.6 K/uL Final  . Monocytes Relative 01/05/2015 9   Final  . Monocytes Absolute 01/05/2015  0.5  0.2 - 0.9 K/uL Final  . Eosinophils Relative 01/05/2015 1   Final  . Eosinophils Absolute 01/05/2015 0.1  0 - 0.7 K/uL Final  . Basophils Relative 01/05/2015 1   Final  . Basophils Absolute 01/05/2015 0.1  0 - 0.1 K/uL Final  . Sodium 01/05/2015 133* 135 - 145 mmol/L Final  . Potassium 01/05/2015 3.8  3.5 - 5.1 mmol/L Final  . Chloride 01/05/2015 98* 101 - 111 mmol/L Final  . CO2 01/05/2015 28  22 - 32 mmol/L Final  . Glucose, Bld 01/05/2015 101* 65 - 99 mg/dL Final  . BUN 01/05/2015 17  6 - 20 mg/dL Final  . Creatinine, Ser 01/05/2015 0.93  0.44 - 1.00 mg/dL Final  . Calcium 01/05/2015 9.6  8.9 - 10.3 mg/dL Final  . Total Protein 01/05/2015 8.6* 6.5 - 8.1 g/dL Final  . Albumin 01/05/2015 3.6  3.5 - 5.0 g/dL Final  . AST 01/05/2015 35  15 - 41 U/L Final  . ALT 01/05/2015 16  14 - 54 U/L Final  . Alkaline Phosphatase 01/05/2015 49  38 - 126 U/L Final  . Total Bilirubin 01/05/2015 0.5  0.3 - 1.2 mg/dL Final  . GFR calc non Af Amer 01/05/2015 >60  >60 mL/min Final  . GFR calc Af Amer 01/05/2015 >60  >60 mL/min Final   Comment: (NOTE) The eGFR has been calculated using the CKD EPI equation. This calculation has not been validated in all clinical situations. eGFR's persistently <60 mL/min signify possible Chronic  Kidney Disease.   . Anion gap 01/05/2015 7  5 - 15 Final    Assessment:  Elizabeth Bond is a 65 y.o. female with metastatic Her2/neu positive right breast cancer.  She initially presented in 03/2013 with an ulcerated breast mass and back pain.  Breast biopsy revealed lobular breast cancer.  Tumor was ER/PR positive and HER-2/neu positive. PET scan revealed bone metastasis. She received palliative radiation to T8 and T10 from 04/02-04/15/2015.  She declined chemotherapy. She initially began letrozole and Tykerb. Tykerb was discontinued after 1 dose. She received letrozole from 05/02/2013 until 01/07/2014 .  She received Herceptin from 06/02/2013 until 12/15/2013. She has received fulvestrant monthly from 01/07/2014 until 05/27/2014.  She has received Xgeva monthly (03/31/2013 until 08/25/2014; 12/15/2014).  Notes indicate Herceptin was discontinued for a rising CA-27-29.  CA27.29 was 916.3 on 03/19/2013, 286.2 on 06/23/2013, 70.8 on 08/25/2013, 56.3 on 10/08/2013, 102.7 on 12/15/2013, 149.4 on 01/07/2014, 178.0 on 02/04/2014, 190.7 on 03/30/2014, 143.2 on 04/22/2014, 134.8 on 05/20/2014, 248.3 on 07/09/2014, 723.2 on 08/25/2014, 252.3 on 10/08/2014, 126 on 10/29/2014, 71.3 on 11/24/2014, 55 on 12/15/2014, and 70.6 on 01/05/2015.   She received Ibrance and Faslodex from 02/23/2014 until 07/27/2014.  Echo on 09/04/2014 revealed an EF of 55-65%.  Echo on 12/14/2014 revealed an EF of 55-60%.  PET scan on 08/28/2014 revealed significant interval worsening and multifocal osseous metastatic disease. Lesions had increased in number and metabolic activity. There were no extra osseous metastasis.  She is s/p 5 cycles of Kadcyla (09/10/2014-12/15/2014).  She is tolerating treatment well.  She denies any bone pain.   She was admitted from 12/22/2014 - 12/24/2014 with E coli sepsis secondary to a UTI.  She completed her Augmentin.  Serum protein is elevated.  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  Symptomatically, she is doing well.  Appetite is good on Marinol.  Exam is unremarkable.  Plan: 1.  Labs today:  CBC with diff, CMP, Mg, CA27.29. 2.  Cycle #6 Kadcyla. 3.  PET scan 02/08/2015:  follow-up metastatic disease (date in February per patient request) 4.  Next echo 03/15/2015. 5.  RTC in 3 weeks for MD assessment, labs (CBC with diff, CMP, CA27.29), and cycle #7 Kadcyla (TDM-1; trastuzumab emtansine).   Lequita Asal, MD  01/05/2015, 11:28 AM

## 2015-01-06 LAB — CANCER ANTIGEN 27.29: CA 27.29: 70.6 U/mL — ABNORMAL HIGH (ref 0.0–38.6)

## 2015-01-12 ENCOUNTER — Inpatient Hospital Stay: Payer: Medicare Other

## 2015-01-15 ENCOUNTER — Telehealth: Payer: Self-pay | Admitting: *Deleted

## 2015-01-15 ENCOUNTER — Other Ambulatory Visit: Payer: Self-pay | Admitting: Hematology and Oncology

## 2015-01-15 MED ORDER — POTASSIUM CHLORIDE ER 10 MEQ PO TBCR: 10.0000 meq | EXTENDED_RELEASE_TABLET | Freq: Every day | ORAL | Status: DC

## 2015-01-15 NOTE — Telephone Encounter (Signed)
Escribed

## 2015-01-25 ENCOUNTER — Encounter: Payer: Self-pay | Admitting: Hematology and Oncology

## 2015-01-26 ENCOUNTER — Other Ambulatory Visit: Payer: Self-pay | Admitting: Hematology and Oncology

## 2015-01-26 ENCOUNTER — Inpatient Hospital Stay (HOSPITAL_BASED_OUTPATIENT_CLINIC_OR_DEPARTMENT_OTHER): Payer: Medicare Other | Admitting: Hematology and Oncology

## 2015-01-26 ENCOUNTER — Other Ambulatory Visit: Payer: Self-pay

## 2015-01-26 ENCOUNTER — Encounter: Payer: Self-pay | Admitting: Hematology and Oncology

## 2015-01-26 ENCOUNTER — Inpatient Hospital Stay: Payer: Medicare Other

## 2015-01-26 VITALS — BP 148/84 | HR 86 | Temp 97.8°F | Resp 18 | Ht 64.0 in | Wt 112.9 lb

## 2015-01-26 DIAGNOSIS — Z79899 Other long term (current) drug therapy: Secondary | ICD-10-CM

## 2015-01-26 DIAGNOSIS — C50911 Malignant neoplasm of unspecified site of right female breast: Secondary | ICD-10-CM

## 2015-01-26 DIAGNOSIS — Z5112 Encounter for antineoplastic immunotherapy: Secondary | ICD-10-CM | POA: Diagnosis not present

## 2015-01-26 DIAGNOSIS — C7951 Secondary malignant neoplasm of bone: Secondary | ICD-10-CM

## 2015-01-26 DIAGNOSIS — C50919 Malignant neoplasm of unspecified site of unspecified female breast: Secondary | ICD-10-CM

## 2015-01-26 DIAGNOSIS — D890 Polyclonal hypergammaglobulinemia: Secondary | ICD-10-CM

## 2015-01-26 DIAGNOSIS — Z170 Estrogen receptor positive status [ER+]: Secondary | ICD-10-CM

## 2015-01-26 LAB — CBC WITH DIFFERENTIAL/PLATELET
Basophils Absolute: 0 10*3/uL (ref 0–0.1)
Basophils Relative: 1 %
Eosinophils Absolute: 0.1 10*3/uL (ref 0–0.7)
Eosinophils Relative: 1 %
HCT: 33.1 % — ABNORMAL LOW (ref 35.0–47.0)
Hemoglobin: 11.1 g/dL — ABNORMAL LOW (ref 12.0–16.0)
Lymphocytes Relative: 17 %
Lymphs Abs: 0.8 10*3/uL — ABNORMAL LOW (ref 1.0–3.6)
MCH: 32.3 pg (ref 26.0–34.0)
MCHC: 33.7 g/dL (ref 32.0–36.0)
MCV: 95.9 fL (ref 80.0–100.0)
Monocytes Absolute: 0.3 10*3/uL (ref 0.2–0.9)
Monocytes Relative: 7 %
Neutro Abs: 3.5 10*3/uL (ref 1.4–6.5)
Neutrophils Relative %: 74 %
Platelets: 213 10*3/uL (ref 150–440)
RBC: 3.45 MIL/uL — ABNORMAL LOW (ref 3.80–5.20)
RDW: 15.8 % — ABNORMAL HIGH (ref 11.5–14.5)
WBC: 4.8 10*3/uL (ref 3.6–11.0)

## 2015-01-26 LAB — COMPREHENSIVE METABOLIC PANEL
ALT: 17 U/L (ref 14–54)
AST: 35 U/L (ref 15–41)
Albumin: 3.2 g/dL — ABNORMAL LOW (ref 3.5–5.0)
Alkaline Phosphatase: 49 U/L (ref 38–126)
Anion gap: 5 (ref 5–15)
BUN: 17 mg/dL (ref 6–20)
CO2: 26 mmol/L (ref 22–32)
Calcium: 8.9 mg/dL (ref 8.9–10.3)
Chloride: 104 mmol/L (ref 101–111)
Creatinine, Ser: 0.75 mg/dL (ref 0.44–1.00)
GFR calc Af Amer: >60 mL/min (ref >60)
GFR calc non Af Amer: >60 mL/min (ref >60)
Glucose, Bld: 86 mg/dL (ref 65–99)
Potassium: 3.9 mmol/L (ref 3.5–5.1)
Sodium: 135 mmol/L (ref 135–145)
Total Bilirubin: 0.4 mg/dL (ref 0.3–1.2)
Total Protein: 7.9 g/dL (ref 6.5–8.1)

## 2015-01-26 MED ORDER — OXYCODONE HCL ER 15 MG PO T12A: 15.0000 mg | EXTENDED_RELEASE_TABLET | Freq: Two times a day (BID) | ORAL | Status: DC

## 2015-01-26 MED ORDER — SODIUM CHLORIDE 0.9 % IV SOLN: 3.6000 mg/kg | Freq: Once | INTRAVENOUS | Status: DC

## 2015-01-26 MED ORDER — ADO-TRASTUZUMAB EMTANSINE CHEMO INJECTION 160 MG
3.6000 mg/kg | Freq: Once | INTRAVENOUS | Status: AC
  Administered 2015-01-26: 180 mg via INTRAVENOUS
  Filled 2015-01-26: qty 9

## 2015-01-26 MED ORDER — ACETAMINOPHEN 325 MG PO TABS
650.0000 mg | ORAL_TABLET | Freq: Once | ORAL | Status: AC
  Administered 2015-01-26: 650 mg via ORAL
  Filled 2015-01-26: qty 2

## 2015-01-26 MED ORDER — DIPHENHYDRAMINE HCL 25 MG PO CAPS
50.0000 mg | ORAL_CAPSULE | Freq: Once | ORAL | Status: AC
  Administered 2015-01-26: 50 mg via ORAL
  Filled 2015-01-26: qty 2

## 2015-01-26 MED ORDER — OXYCODONE HCL 5 MG PO CAPS: ORAL_CAPSULE | ORAL | Status: DC

## 2015-01-26 MED ORDER — SODIUM CHLORIDE 0.9 % IV SOLN
Freq: Once | INTRAVENOUS | Status: AC
  Administered 2015-01-26: 12:00:00 via INTRAVENOUS
  Filled 2015-01-26: qty 1000

## 2015-01-26 MED ORDER — HEPARIN SOD (PORK) LOCK FLUSH 100 UNIT/ML IV SOLN: 500.0000 [IU] | Freq: Once | INTRAVENOUS | Status: DC | PRN

## 2015-01-26 MED ORDER — DENOSUMAB 120 MG/1.7ML ~~LOC~~ SOLN
120.0000 mg | Freq: Once | SUBCUTANEOUS | Status: AC
  Administered 2015-01-26: 120 mg via SUBCUTANEOUS
  Filled 2015-01-26: qty 1.7

## 2015-01-26 MED ORDER — HEPARIN SOD (PORK) LOCK FLUSH 100 UNIT/ML IV SOLN
500.0000 [IU] | Freq: Once | INTRAVENOUS | Status: AC
  Administered 2015-01-26: 500 [IU] via INTRAVENOUS
  Filled 2015-01-26: qty 5

## 2015-01-26 MED ORDER — SODIUM CHLORIDE 0.9 % IJ SOLN
10.0000 mL | INTRAMUSCULAR | Status: DC | PRN
  Administered 2015-01-26: 10 mL via INTRAVENOUS
  Filled 2015-01-26: qty 10

## 2015-01-26 NOTE — Progress Notes (Signed)
[facility] day:  01/26/2015   Chief Complaint: Elizabeth Bond is a 65 y.o. female with metastatic Her2/neu positive breast cancer who is seen for assessment prior to cycle #7 Kadcyla.  HPI:  The patient was last seen in the medical oncology clinic on 01/05/2015.  At that time, she received cycle #6 Kadcyla.  She had recently been admitted to [facility] from with sepsis secondary to a UTI.  She completed a course of Augmentin.  She was back to her baseline health.   During the interim, she has done well.  She denies any bone pain.  She notes that she rarely takes her breakthrough pain medications (none in awhile).  She is not taking Marinol.  She has gained 3 pounds.  Past Medical History  Diagnosis Date  . IBS (irritable bowel syndrome)   . Cancer ([place]) 2015    breast and bone  . Breast cancer ([place])     right  . Metastasis to bone ([place]) 09/01/2014  . COPD (chronic obstructive pulmonary disease) [facility])     Past Surgical History  Procedure Laterality Date  . Replacement total knee Left 2004-2005?  Marland Kitchen Abdominal hysterectomy  1978  . Portacath placement  2015    Family History  Problem Relation Age of Onset  . Cancer Father     prostate  . Cancer Maternal Aunt     breast  . Cancer Cousin     breast  . Cancer Other     lung cancer - neice  . Cancer - Other Daughter     brain  . Cancer Mother     Pancreatic    Social History:  reports that she quit smoking about 21 months ago. Her smoking use included Cigarettes. She has a 10 pack-year smoking history. She has never used smokeless tobacco. She reports that she does not drink alcohol or use illicit drugs.  The patient is alone today.  Allergies:  Allergies  Allergen Reactions  . Fentanyl Other (See Comments)    "loopy and confused"    Current Medications: Current Outpatient Prescriptions  Medication Sig Dispense Refill  . acetaminophen (TYLENOL) 500 MG tablet Take 1,000 mg by mouth  every 8 (eight) hours as needed for headache.    . calcium carbonate (OS-CAL) 600 MG TABS tablet Take 1 tablet (600 mg total) by mouth 2 (two) times daily with a meal. 60 tablet 3  . clonazePAM (KLONOPIN) 0.5 MG tablet Take 1 tablet (0.5 mg total) by mouth 3 (three) times daily as needed for anxiety. 90 tablet 2  . dronabinol (MARINOL) 2.5 MG capsule Take 1 capsule (2.5 mg total) by mouth 2 (two) times daily before lunch and supper. 60 capsule 0  . furosemide (LASIX) 20 MG tablet TAKE ONE TABLET BY MOUTH EVERY OTHER DAYAS NEEDED FOR SWELLING 30 tablet 1  . ondansetron (ZOFRAN) 4 MG tablet Take 1 tablet (4 mg total) by mouth every 8 (eight) hours as needed for nausea or vomiting. 20 tablet 1  . oxycodone (OXY-IR) 5 MG capsule Take 1 tablet every 4 hours by mouth as needed for pain 20 capsule 0  . oxyCODONE (OXYCONTIN) 15 mg 12 hr tablet Take 1 tablet (15 mg total) by mouth every 12 (twelve) hours. 60 tablet 0  . pantoprazole (PROTONIX) 40 MG tablet Take 1 tablet (40 mg total) by mouth daily. 30 tablet 6  . phosphorus (PHOSPHA 250 NEUTRAL) 155-852-130 MG tablet Take 1 tablet (250 mg total)  by mouth 2 (two) times daily. 60 tablet 2  . potassium chloride (K-DUR) 10 MEQ tablet Take 1 tablet (10 mEq total) by mouth daily. 30 tablet 2   No current facility-administered medications for this visit.   Facility-Administered Medications Ordered in Other Visits  Medication Dose Route Frequency Provider Last Rate Last Dose  . heparin lock flush 100 unit/mL  500 Units Intravenous Once Lequita Asal, MD      . sodium chloride 0.9 % injection 10 mL  10 mL Intravenous PRN Lequita Asal, MD   10 mL at 01/26/15 0940    Review of Systems:  GENERAL:  Feels good.  No fevers or sweats.  Weight up 3 pounds. PERFORMANCE STATUS (ECOG):  0 HEENT:  No visual changes, runny nose, sore throat, mouth sores or tenderness. Lungs: No shortness of breath or cough.  No hemoptysis. Cardiac:  No chest pain,  palpitations, orthopnea, or PND. GI:  No nausea, vomiting, diarrhea, constipation, melena or hematochezia. GU:  No urgency, frequency, dysuria, or hematuria. Musculoskeletal:  No back pain.  No joint pain.  No muscle tenderness. Extremities:  No pain or swelling. Skin:  No rashes or skin changes. Neuro:  No headache, numbness or weakness, balance or coordination issues. Endocrine:  No diabetes, thyroid issues, hot flashes or night sweats. Psych:  No mood changes, depression or anxiety. Pain:  No focal pain. Review of systems:  All other systems reviewed and found to be negative.   Physical Exam: Blood pressure 148/84, pulse 86, temperature 97.8 F (36.6 C), temperature source Tympanic, resp. rate 18, height 5' 4"  (1.626 m), weight 112 lb 14 oz (51.2 kg). GENERAL:  Well developed, well nourished, sitting comfortably in the exam room in no acute distress. MENTAL STATUS:  Alert and oriented to person, place and time. HEAD:  Short styled gray hair.  Normocephalic, atraumatic, face symmetric, no Cushingoid features. EYES: Hazel eyes.  Pupils equal round and reactive to light and accomodation.  No conjunctivitis or scleral icterus. ENT:  Oropharynx clear without lesion.  Tongue normal. Mucous membranes moist.  RESPIRATORY:  Clear to auscultation without rales, wheezes or rhonchi. CARDIOVASCULAR:  Regular rate and rhythm without murmur, rub or gallop.  No JVD. ABDOMEN:  Soft, non-tender, with active bowel sounds, and no hepatosplenomegaly.  No masses. SKIN:  RLQ fading area of ecchymosis s/p Lovenox injection during hospitalization.  No rashes, ulcers or lesions. EXTREMITIES: No edema, no skin discoloration or tenderness.  No palpable cords. LYMPH NODES: No palpable cervical, supraclavicular, axillary or inguinal adenopathy  NEUROLOGICAL: Unremarkable. PSYCH:  Appropriate.  NEUROLOGICAL: Unremarkable.  Infusion on 01/26/2015  Component Date Value Ref Range Status  . WBC 01/26/2015 4.8  3.6 -  11.0 K/uL Final  . RBC 01/26/2015 3.45* 3.80 - 5.20 MIL/uL Final  . Hemoglobin 01/26/2015 11.1* 12.0 - 16.0 g/dL Final  . HCT 01/26/2015 33.1* 35.0 - 47.0 % Final  . MCV 01/26/2015 95.9  80.0 - 100.0 fL Final  . MCH 01/26/2015 32.3  26.0 - 34.0 pg Final  . MCHC 01/26/2015 33.7  32.0 - 36.0 g/dL Final  . RDW 01/26/2015 15.8* 11.5 - 14.5 % Final  . Platelets 01/26/2015 213  150 - 440 K/uL Final  . Neutrophils Relative % 01/26/2015 74   Final  . Neutro Abs 01/26/2015 3.5  1.4 - 6.5 K/uL Final  . Lymphocytes Relative 01/26/2015 17   Final  . Lymphs Abs 01/26/2015 0.8* 1.0 - 3.6 K/uL Final  . Monocytes Relative 01/26/2015 7  Final  . Monocytes Absolute 01/26/2015 0.3  0.2 - 0.9 K/uL Final  . Eosinophils Relative 01/26/2015 1   Final  . Eosinophils Absolute 01/26/2015 0.1  0 - 0.7 K/uL Final  . Basophils Relative 01/26/2015 1   Final  . Basophils Absolute 01/26/2015 0.0  0 - 0.1 K/uL Final    Assessment:  TALIYAH WATROUS is a 65 y.o. female with metastatic Her2/neu positive right breast cancer.  She initially presented in 03/2013 with an ulcerated breast mass and back pain.  Breast biopsy revealed lobular breast cancer.  Tumor was ER/PR positive and HER-2/neu positive. PET scan revealed bone metastasis. She received palliative radiation to T8 and T10 from 04/02-04/15/2015.  She declined chemotherapy. She initially began letrozole and Tykerb. Tykerb was discontinued after 1 dose. She received letrozole from 05/02/2013 until 01/07/2014 .  She received Herceptin from 06/02/2013 until 12/15/2013. She has received fulvestrant monthly from 01/07/2014 until 05/27/2014.  She has received Xgeva monthly (03/31/2013 until 08/25/2014; 12/15/2014).  Notes indicate Herceptin was discontinued for a rising CA-27-29.  CA27.29 was 916.3 on 03/19/2013, 286.2 on 06/23/2013, 70.8 on 08/25/2013, 56.3 on 10/08/2013, 102.7 on 12/15/2013, 149.4 on 01/07/2014, 178.0 on 02/04/2014, 190.7 on 03/30/2014, 143.2 on 04/22/2014,  134.8 on 05/20/2014, 248.3 on 07/09/2014, 723.2 on 08/25/2014, 252.3 on 10/08/2014, 126 on 10/29/2014, 71.3 on 11/24/2014, 55 on 12/15/2014, and 70.6 on 01/05/2015.   She received Ibrance and Faslodex from 02/23/2014 until 07/27/2014.  Echo on 09/04/2014 revealed an EF of 55-65%.  Echo on 12/14/2014 revealed an EF of 55-60%.  PET scan on 08/28/2014 revealed significant interval worsening and multifocal osseous metastatic disease. Lesions had increased in number and metabolic activity. There were no extra osseous metastasis.  She is s/p 6 cycles of Kadcyla (09/10/2014-01/05/2015).  She is tolerating treatment well.  She denies any bone pain.   She was admitted from 12/22/2014 - 12/24/2014 with E coli sepsis secondary to a UTI.  She completed her Augmentin.  Serum protein is elevated.  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  Symptomatically, she is doing well.  She has not needed any breakthrough pain medications in some time.  She wishes to taper her pain medications..  Exam is unremarkable.  Plan: 1.  Labs today:  CBC with diff, CMP, Mg, CA27.29. 2.  Cycle #7 Kadcyla. 3.  Xgeva today. 4.  Discuss pain medications and taper as patient taking oxycodone prn rarely. 5.  Rx:  Oxycontin 15 mg po q 12 hours (dis # 60) and oxycodone 5 mg q 4-6 hrs prn pain (dis:#20). 6.  PET scan planned for 02/08/2015.  Discuss last CA27.29. 7.  Echo planned for 03/15/2015. 8.  RTC on 02/15/2014 for MD assessment, labs (CBC with diff, CMP, CA27.29), review of interval scans, and cycle #8 Kadcyla (TDM-1; trastuzumab emtansine).   Lequita Asal, MD  01/26/2015, 10:28 AM

## 2015-01-27 LAB — CANCER ANTIGEN 27.29: CA 27.29: 48.6 U/mL — ABNORMAL HIGH (ref 0.0–38.6)

## 2015-02-08 ENCOUNTER — Encounter
Admission: RE | Admit: 2015-02-08 | Discharge: 2015-02-08 | Disposition: A | Discharge status: Home / Self Care | Payer: Medicare Other | Source: Ambulatory Visit | Attending: Hematology and Oncology | Admitting: Hematology and Oncology

## 2015-02-08 DIAGNOSIS — C50919 Malignant neoplasm of unspecified site of unspecified female breast: Secondary | ICD-10-CM | POA: Insufficient documentation

## 2015-02-08 DIAGNOSIS — C50911 Malignant neoplasm of unspecified site of right female breast: Secondary | ICD-10-CM | POA: Diagnosis not present

## 2015-02-08 LAB — GLUCOSE, CAPILLARY: Glucose-Capillary: 90 mg/dL (ref 65–99)

## 2015-02-08 MED ORDER — FLUDEOXYGLUCOSE F - 18 (FDG) INJECTION
12.9300 | Freq: Once | INTRAVENOUS | Status: AC | PRN
  Administered 2015-02-08: 12.93 via INTRAVENOUS

## 2015-02-15 ENCOUNTER — Telehealth: Payer: Self-pay | Admitting: *Deleted

## 2015-02-15 ENCOUNTER — Inpatient Hospital Stay: Payer: Medicare Other

## 2015-02-15 DIAGNOSIS — Z5112 Encounter for antineoplastic immunotherapy: Secondary | ICD-10-CM | POA: Diagnosis not present

## 2015-02-15 DIAGNOSIS — Z87891 Personal history of nicotine dependence: Secondary | ICD-10-CM | POA: Insufficient documentation

## 2015-02-15 DIAGNOSIS — C7951 Secondary malignant neoplasm of bone: Secondary | ICD-10-CM

## 2015-02-15 DIAGNOSIS — C50911 Malignant neoplasm of unspecified site of right female breast: Secondary | ICD-10-CM

## 2015-02-15 DIAGNOSIS — N390 Urinary tract infection, site not specified: Secondary | ICD-10-CM

## 2015-02-15 DIAGNOSIS — I2510 Atherosclerotic heart disease of native coronary artery without angina pectoris: Secondary | ICD-10-CM | POA: Diagnosis not present

## 2015-02-15 DIAGNOSIS — K589 Irritable bowel syndrome without diarrhea: Secondary | ICD-10-CM | POA: Insufficient documentation

## 2015-02-15 DIAGNOSIS — J449 Chronic obstructive pulmonary disease, unspecified: Secondary | ICD-10-CM | POA: Insufficient documentation

## 2015-02-15 DIAGNOSIS — Z79899 Other long term (current) drug therapy: Secondary | ICD-10-CM | POA: Insufficient documentation

## 2015-02-15 DIAGNOSIS — Z170 Estrogen receptor positive status [ER+]: Secondary | ICD-10-CM | POA: Insufficient documentation

## 2015-02-15 LAB — COMPREHENSIVE METABOLIC PANEL
ALBUMIN: 3.6 g/dL (ref 3.5–5.0)
ALT: 20 U/L (ref 14–54)
AST: 34 U/L (ref 15–41)
Alkaline Phosphatase: 61 U/L (ref 38–126)
Anion gap: 6 (ref 5–15)
BUN: 20 mg/dL (ref 6–20)
CHLORIDE: 101 mmol/L (ref 101–111)
CO2: 28 mmol/L (ref 22–32)
CREATININE: 0.76 mg/dL (ref 0.44–1.00)
Calcium: 8.8 mg/dL — ABNORMAL LOW (ref 8.9–10.3)
GFR calc non Af Amer: >60 mL/min (ref >60)
Glucose, Bld: 83 mg/dL (ref 65–99)
Potassium: 3.8 mmol/L (ref 3.5–5.1)
SODIUM: 135 mmol/L (ref 135–145)
Total Bilirubin: 0.4 mg/dL (ref 0.3–1.2)
Total Protein: 8.2 g/dL — ABNORMAL HIGH (ref 6.5–8.1)

## 2015-02-15 LAB — CBC WITH DIFFERENTIAL/PLATELET
BASOS ABS: 0 10*3/uL (ref 0–0.1)
BASOS PCT: 1 %
EOS ABS: 0.1 10*3/uL (ref 0–0.7)
EOS PCT: 1 %
HCT: 35.4 % (ref 35.0–47.0)
Hemoglobin: 12 g/dL (ref 12.0–16.0)
Lymphocytes Relative: 18 %
Lymphs Abs: 0.9 10*3/uL — ABNORMAL LOW (ref 1.0–3.6)
MCH: 30.7 pg (ref 26.0–34.0)
MCHC: 33.9 g/dL (ref 32.0–36.0)
MCV: 90.6 fL (ref 80.0–100.0)
Monocytes Absolute: 0.4 10*3/uL (ref 0.2–0.9)
Monocytes Relative: 8 %
Neutro Abs: 3.6 10*3/uL (ref 1.4–6.5)
Neutrophils Relative %: 72 %
PLATELETS: 161 10*3/uL (ref 150–440)
RBC: 3.91 MIL/uL (ref 3.80–5.20)
RDW: 16.6 % — ABNORMAL HIGH (ref 11.5–14.5)
WBC: 5 10*3/uL (ref 3.6–11.0)

## 2015-02-15 LAB — URINALYSIS COMPLETE WITH MICROSCOPIC (ARMC ONLY)
BACTERIA UA: NONE SEEN
Bilirubin Urine: NEGATIVE
Glucose, UA: NEGATIVE mg/dL
Hgb urine dipstick: NEGATIVE
KETONES UR: NEGATIVE mg/dL
Leukocytes, UA: NEGATIVE
NITRITE: NEGATIVE
PH: 5 (ref 5.0–8.0)
PROTEIN: NEGATIVE mg/dL
SPECIFIC GRAVITY, URINE: 1.02 (ref 1.005–1.030)

## 2015-02-15 NOTE — Telephone Encounter (Signed)
Pt agrees to come in for lab check today at 1PM

## 2015-02-15 NOTE — Telephone Encounter (Signed)
Was in hospital with UTI and discharged 12/22 and had no fu appt. She is exhibiting signs of possible recurrence of her UTI by mood swings and being very "ill and hateful" Asking if we can check her urine for possible UTI. She is also very depressed and states the antidepressant is not working.

## 2015-02-16 ENCOUNTER — Inpatient Hospital Stay: Payer: Medicare Other

## 2015-02-16 ENCOUNTER — Encounter: Payer: Self-pay | Admitting: Hematology and Oncology

## 2015-02-16 ENCOUNTER — Inpatient Hospital Stay: Payer: Medicare Other | Attending: Hematology and Oncology | Admitting: Hematology and Oncology

## 2015-02-16 ENCOUNTER — Other Ambulatory Visit: Payer: Self-pay

## 2015-02-16 ENCOUNTER — Other Ambulatory Visit: Payer: Self-pay | Admitting: Hematology and Oncology

## 2015-02-16 VITALS — BP 165/87 | HR 92 | Temp 97.6°F | Resp 18 | Ht 64.0 in | Wt 113.2 lb

## 2015-02-16 DIAGNOSIS — C50911 Malignant neoplasm of unspecified site of right female breast: Secondary | ICD-10-CM

## 2015-02-16 DIAGNOSIS — Z5112 Encounter for antineoplastic immunotherapy: Secondary | ICD-10-CM | POA: Diagnosis not present

## 2015-02-16 DIAGNOSIS — Z79899 Other long term (current) drug therapy: Secondary | ICD-10-CM

## 2015-02-16 DIAGNOSIS — Z87891 Personal history of nicotine dependence: Secondary | ICD-10-CM | POA: Diagnosis not present

## 2015-02-16 DIAGNOSIS — C7951 Secondary malignant neoplasm of bone: Secondary | ICD-10-CM

## 2015-02-16 DIAGNOSIS — Z170 Estrogen receptor positive status [ER+]: Secondary | ICD-10-CM

## 2015-02-16 DIAGNOSIS — C50919 Malignant neoplasm of unspecified site of unspecified female breast: Secondary | ICD-10-CM

## 2015-02-16 LAB — CA 27.29 (SERIAL MONITOR): CA 27.29: 52.7 U/mL — AB (ref 0.0–38.6)

## 2015-02-16 MED ORDER — SODIUM CHLORIDE 0.9 % IV SOLN
Freq: Once | INTRAVENOUS | Status: AC
  Administered 2015-02-16: 10:00:00 via INTRAVENOUS
  Filled 2015-02-16: qty 1000

## 2015-02-16 MED ORDER — HEPARIN SOD (PORK) LOCK FLUSH 100 UNIT/ML IV SOLN
500.0000 [IU] | Freq: Once | INTRAVENOUS | Status: AC | PRN
  Administered 2015-02-16: 500 [IU]
  Filled 2015-02-16: qty 5

## 2015-02-16 MED ORDER — SODIUM CHLORIDE 0.9 % IV SOLN
3.6000 mg/kg | Freq: Once | INTRAVENOUS | Status: AC
  Administered 2015-02-16: 200 mg via INTRAVENOUS
  Filled 2015-02-16: qty 10

## 2015-02-16 MED ORDER — SODIUM CHLORIDE 0.9 % IJ SOLN
10.0000 mL | INTRAMUSCULAR | Status: DC | PRN
  Administered 2015-02-16: 10 mL
  Filled 2015-02-16: qty 10

## 2015-02-16 MED ORDER — DIPHENHYDRAMINE HCL 25 MG PO CAPS
50.0000 mg | ORAL_CAPSULE | Freq: Once | ORAL | Status: AC
  Administered 2015-02-16: 50 mg via ORAL
  Filled 2015-02-16: qty 2

## 2015-02-16 MED ORDER — ACETAMINOPHEN 325 MG PO TABS
650.0000 mg | ORAL_TABLET | Freq: Once | ORAL | Status: AC
  Administered 2015-02-16: 650 mg via ORAL
  Filled 2015-02-16: qty 2

## 2015-02-16 NOTE — Progress Notes (Signed)
[facility] day:  02/16/2015   Chief Complaint: Elizabeth Bond is a 65 y.o. female with metastatic Her2/neu positive breast cancer who is seen for review of interval PET scan and assessment prior to cycle #8 Kadcyla.  HPI:  The patient was last seen in the medical oncology clinic on 01/26/2015.  At that time, she received cycle #7 Kadcyla.  She tolerated her infusion well.  Per prior plan, she was scheduled for restaging PET scan.  PET scan on 02/08/2015 revealed a complete metabolic response in the sclerotic osseous metastases, with no residual hypermetabolic metastatic disease.  The absence of marrow uptake in the mid to lower thoracic spine, probably represened postradiation change. Mild patchy marrow uptake throughout the remaining skeleton, was in keeping with mild reactive marrow state.  There has been an nterval mild increase in mild patchy reticulation in the medial paramediastinal lungs with associated low level metabolism, favor evolving post radiation change.  Chronic findings included coronary atherosclerosis and diffuse colonic diverticulosis.  Symptomatically, she feels good.  She has gained weight.  She has been doing some house cleaning.    Past Medical History  Diagnosis Date  . IBS (irritable bowel syndrome)   . Cancer ([place]) 2015    breast and bone  . Breast cancer ([place])     right  . Metastasis to bone ([place]) 09/01/2014  . COPD (chronic obstructive pulmonary disease) [facility])     Past Surgical History  Procedure Laterality Date  . Replacement total knee Left 2004-2005?  Marland Kitchen Abdominal hysterectomy  1978  . Portacath placement  2015    Family History  Problem Relation Age of Onset  . Cancer Father     prostate  . Cancer Maternal Aunt     breast  . Cancer Cousin     breast  . Cancer Other     lung cancer - neice  . Cancer - Other Daughter     brain  . Cancer Mother     Pancreatic    Social History:  reports that she quit  smoking about 22 months ago. Her smoking use included Cigarettes. She has a 10 pack-year smoking history. She has never used smokeless tobacco. She reports that she does not drink alcohol or use illicit drugs.  The patient is accompanied by her sister, Mariann Laster, today.  Allergies:  Allergies  Allergen Reactions  . Fentanyl Other (See Comments)    "loopy and confused"    Current Medications: Current Outpatient Prescriptions  Medication Sig Dispense Refill  . acetaminophen (TYLENOL) 500 MG tablet Take 1,000 mg by mouth every 8 (eight) hours as needed for headache.    . calcium carbonate (OS-CAL) 600 MG TABS tablet Take 1 tablet (600 mg total) by mouth 2 (two) times daily with a meal. 60 tablet 3  . clonazePAM (KLONOPIN) 0.5 MG tablet Take 1 tablet (0.5 mg total) by mouth 3 (three) times daily as needed for anxiety. 90 tablet 2  . furosemide (LASIX) 20 MG tablet TAKE ONE TABLET BY MOUTH EVERY OTHER DAYAS NEEDED FOR SWELLING 30 tablet 1  . oxycodone (OXY-IR) 5 MG capsule Take 1 tablet every 4 hours by mouth as needed for pain 20 capsule 0  . oxyCODONE (OXYCONTIN) 15 mg 12 hr tablet Take 1 tablet (15 mg total) by mouth every 12 (twelve) hours. 60 tablet 0  . pantoprazole (PROTONIX) 40 MG tablet Take 1 tablet (40 mg total) by mouth daily. 30 tablet 6  .  phosphorus (PHOSPHA 250 NEUTRAL) 155-852-130 MG tablet Take 1 tablet (250 mg total) by mouth 2 (two) times daily. 60 tablet 2  . potassium chloride (K-DUR) 10 MEQ tablet Take 1 tablet (10 mEq total) by mouth daily. 30 tablet 2  . ondansetron (ZOFRAN) 4 MG tablet Take 1 tablet (4 mg total) by mouth every 8 (eight) hours as needed for nausea or vomiting. (Patient not taking: Reported on 02/16/2015) 20 tablet 1   No current facility-administered medications for this visit.    Review of Systems:  GENERAL:  Feels good.  No fevers or sweats.  Weight up 1 pound. PERFORMANCE STATUS (ECOG):  0 HEENT:  No visual changes, runny nose, sore throat, mouth sores  or tenderness. Lungs: No shortness of breath or cough.  No hemoptysis. Cardiac:  No chest pain, palpitations, orthopnea, or PND. GI:  No nausea, vomiting, diarrhea, constipation, melena or hematochezia. GU:  No urgency, frequency, dysuria, or hematuria. Musculoskeletal:  No back pain.  No joint pain.  No muscle tenderness. Extremities:  No pain or swelling. Skin:  No rashes or skin changes. Neuro:  No headache, numbness or weakness, balance or coordination issues. Endocrine:  No diabetes, thyroid issues, hot flashes or night sweats. Psych:  No mood changes, depression or anxiety. Pain:  No focal pain. Review of systems:  All other systems reviewed and found to be negative.   Physical Exam: Blood pressure 165/87, pulse 92, temperature 97.6 F (36.4 C), temperature source Tympanic, resp. rate 18, height 5' 4"  (1.626 m), weight 113 lb 3.3 oz (51.35 kg). GENERAL:  Well developed, well nourished, sitting comfortably in the exam room in no acute distress. MENTAL STATUS:  Alert and oriented to person, place and time. HEAD:  Short styled gray hair.  Normocephalic, atraumatic, face symmetric, no Cushingoid features. EYES: Hazel eyes.  Pupils equal round and reactive to light and accomodation.  No conjunctivitis or scleral icterus. ENT:  Oropharynx clear without lesion.  Tongue normal. Mucous membranes moist.  RESPIRATORY:  Clear to auscultation without rales, wheezes or rhonchi. CARDIOVASCULAR:  Regular rate and rhythm without murmur, rub or gallop.  No JVD. ABDOMEN:  Soft, non-tender, with active bowel sounds, and no hepatosplenomegaly.  No masses. SKIN:  RNo rashes, ulcers or lesions. EXTREMITIES: No edema, no skin discoloration or tenderness.  No palpable cords. LYMPH NODES: No palpable cervical, supraclavicular, axillary or inguinal adenopathy  NEUROLOGICAL: Unremarkable. PSYCH:  Appropriate.  NEUROLOGICAL: Unremarkable.  Appointment on 02/15/2015  Component Date Value Ref Range Status   . WBC 02/15/2015 5.0  3.6 - 11.0 K/uL Final  . RBC 02/15/2015 3.91  3.80 - 5.20 MIL/uL Final  . Hemoglobin 02/15/2015 12.0  12.0 - 16.0 g/dL Final  . HCT 02/15/2015 35.4  35.0 - 47.0 % Final  . MCV 02/15/2015 90.6  80.0 - 100.0 fL Final  . MCH 02/15/2015 30.7  26.0 - 34.0 pg Final  . MCHC 02/15/2015 33.9  32.0 - 36.0 g/dL Final  . RDW 02/15/2015 16.6* 11.5 - 14.5 % Final  . Platelets 02/15/2015 161  150 - 440 K/uL Final  . Neutrophils Relative % 02/15/2015 72   Final  . Neutro Abs 02/15/2015 3.6  1.4 - 6.5 K/uL Final  . Lymphocytes Relative 02/15/2015 18   Final  . Lymphs Abs 02/15/2015 0.9* 1.0 - 3.6 K/uL Final  . Monocytes Relative 02/15/2015 8   Final  . Monocytes Absolute 02/15/2015 0.4  0.2 - 0.9 K/uL Final  . Eosinophils Relative 02/15/2015 1   Final  .  Eosinophils Absolute 02/15/2015 0.1  0 - 0.7 K/uL Final  . Basophils Relative 02/15/2015 1   Final  . Basophils Absolute 02/15/2015 0.0  0 - 0.1 K/uL Final  . Sodium 02/15/2015 135  135 - 145 mmol/L Final  . Potassium 02/15/2015 3.8  3.5 - 5.1 mmol/L Final  . Chloride 02/15/2015 101  101 - 111 mmol/L Final  . CO2 02/15/2015 28  22 - 32 mmol/L Final  . Glucose, Bld 02/15/2015 83  65 - 99 mg/dL Final  . BUN 02/15/2015 20  6 - 20 mg/dL Final  . Creatinine, Ser 02/15/2015 0.76  0.44 - 1.00 mg/dL Final  . Calcium 02/15/2015 8.8* 8.9 - 10.3 mg/dL Final  . Total Protein 02/15/2015 8.2* 6.5 - 8.1 g/dL Final  . Albumin 02/15/2015 3.6  3.5 - 5.0 g/dL Final  . AST 02/15/2015 34  15 - 41 U/L Final  . ALT 02/15/2015 20  14 - 54 U/L Final  . Alkaline Phosphatase 02/15/2015 61  38 - 126 U/L Final  . Total Bilirubin 02/15/2015 0.4  0.3 - 1.2 mg/dL Final  . GFR calc non Af Amer 02/15/2015 >60  >60 mL/min Final  . GFR calc Af Amer 02/15/2015 >60  >60 mL/min Final   Comment: (NOTE) The eGFR has been calculated using the CKD EPI equation. This calculation has not been validated in all clinical situations. eGFR's persistently <60 mL/min  signify possible Chronic Kidney Disease.   . Anion gap 02/15/2015 6  5 - 15 Final  . Color, Urine 02/15/2015 YELLOW* YELLOW Final  . APPearance 02/15/2015 CLEAR* CLEAR Final  . Glucose, UA 02/15/2015 NEGATIVE  NEGATIVE mg/dL Final  . Bilirubin Urine 02/15/2015 NEGATIVE  NEGATIVE Final  . Ketones, ur 02/15/2015 NEGATIVE  NEGATIVE mg/dL Final  . Specific Gravity, Urine 02/15/2015 1.020  1.005 - 1.030 Final  . Hgb urine dipstick 02/15/2015 NEGATIVE  NEGATIVE Final  . pH 02/15/2015 5.0  5.0 - 8.0 Final  . Protein, ur 02/15/2015 NEGATIVE  NEGATIVE mg/dL Final  . Nitrite 02/15/2015 NEGATIVE  NEGATIVE Final  . Leukocytes, UA 02/15/2015 NEGATIVE  NEGATIVE Final  . RBC / HPF 02/15/2015 0-5  0 - 5 RBC/hpf Final  . WBC, UA 02/15/2015 0-5  0 - 5 WBC/hpf Final  . Bacteria, UA 02/15/2015 NONE SEEN  NONE SEEN Final  . Squamous Epithelial / LPF 02/15/2015 0-5* NONE SEEN Final  . Mucous 02/15/2015 PRESENT   Final  . Hyaline Casts, UA 02/15/2015 PRESENT   Final    Assessment:  Elizabeth Bond is a 65 y.o. female with metastatic Her2/neu positive right breast cancer.  She initially presented in 03/2013 with an ulcerated breast mass and back pain.  Breast biopsy revealed lobular breast cancer.  Tumor was ER/PR positive and HER-2/neu positive. PET scan revealed bone metastasis. She received palliative radiation to T8 and T10 from 04/02-04/15/2015.  She declined chemotherapy. She initially began letrozole and Tykerb. Tykerb was discontinued after 1 dose. She received letrozole from 05/02/2013 until 01/07/2014 .  She received Herceptin from 06/02/2013 until 12/15/2013. She has received fulvestrant monthly from 01/07/2014 until 05/27/2014.  She has received Xgeva monthly (03/31/2013 until 08/25/2014; last 01/26/2015).  Notes indicate Herceptin was discontinued for a rising CA-27-29.  CA27.29 was 916.3 on 03/19/2013, 286.2 on 06/23/2013, 70.8 on 08/25/2013, 56.3 on 10/08/2013, 102.7 on 12/15/2013, 149.4 on  01/07/2014, 178.0 on 02/04/2014, 190.7 on 03/30/2014, 143.2 on 04/22/2014, 134.8 on 05/20/2014, 248.3 on 07/09/2014, 723.2 on 08/25/2014, 252.3 on 10/08/2014, 126 on 10/29/2014, 71.3  on 11/24/2014, 55 on 12/15/2014, and 70.6 on 01/05/2015.   She received Ibrance and Faslodex from 02/23/2014 until 07/27/2014.  Echo on 09/04/2014 revealed an EF of 55-65%.  Echo on 12/14/2014 revealed an EF of 55-60%.  PET scan on 08/28/2014 revealed significant interval worsening and multifocal osseous metastatic disease. Lesions had increased in number and metabolic activity. There were no extra osseous metastasis.  She is s/p 7 cycles of Kadcyla (09/10/2014-01/26/2015).  She is tolerating treatment well.  She denies any bone pain.   PET scan on 02/08/2015 revealed a complete metabolic response in the sclerotic osseous metastases, with no residual hypermetabolic metastatic disease.  She was admitted from 12/22/2014 - 12/24/2014 with E coli sepsis secondary to a UTI.  She completed her Augmentin.  Serum protein is elevated.  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  Symptomatically, she feels good.  Exam is unremarkable.  Plan: 1.  Labs today:  CBC with diff, CMP, CA27.29. 2.  Review PET scan.  Discuss metabolic complete response.  Discuss plan for continued treatment until toxicity or progressive disease.  Discuss treatment holidays.  Plan to continue Xgeva every 6 weeks to coordinate with Kadcyla schedule (every other treatment). 3.  Cycle #8 Kadcyla. 4.  Next echo scheduled for 03/15/2015.  Continue every 3 months. 5.  RTC in 3 weeks for MD assessment, labs (CBC with diff, CMP, Mg), Xgeva, and cycle #9 Kadcyla (TDM-1; trastuzumab emtansine).   Lequita Asal, MD  02/16/2015, 9:56 AM

## 2015-02-16 NOTE — Progress Notes (Signed)
No changes since last visit. 

## 2015-02-17 LAB — URINE CULTURE

## 2015-02-26 ENCOUNTER — Other Ambulatory Visit: Payer: Self-pay | Admitting: *Deleted

## 2015-02-26 DIAGNOSIS — C50919 Malignant neoplasm of unspecified site of unspecified female breast: Secondary | ICD-10-CM

## 2015-02-26 MED ORDER — OXYCODONE HCL ER 15 MG PO T12A: 15.0000 mg | EXTENDED_RELEASE_TABLET | Freq: Two times a day (BID) | ORAL | Status: DC

## 2015-03-09 ENCOUNTER — Inpatient Hospital Stay: Payer: Medicare Other

## 2015-03-09 ENCOUNTER — Encounter: Payer: Self-pay | Admitting: Hematology and Oncology

## 2015-03-09 ENCOUNTER — Other Ambulatory Visit: Payer: Self-pay | Admitting: Hematology and Oncology

## 2015-03-09 ENCOUNTER — Inpatient Hospital Stay: Payer: Medicare Other | Attending: Hematology and Oncology | Admitting: Hematology and Oncology

## 2015-03-09 VITALS — BP 153/91 | HR 77 | Temp 97.1°F | Resp 18 | Ht 64.0 in | Wt 113.1 lb

## 2015-03-09 VITALS — BP 130/85 | HR 75 | Resp 20

## 2015-03-09 DIAGNOSIS — Z5112 Encounter for antineoplastic immunotherapy: Secondary | ICD-10-CM | POA: Insufficient documentation

## 2015-03-09 DIAGNOSIS — Z923 Personal history of irradiation: Secondary | ICD-10-CM | POA: Diagnosis not present

## 2015-03-09 DIAGNOSIS — C7951 Secondary malignant neoplasm of bone: Secondary | ICD-10-CM | POA: Diagnosis not present

## 2015-03-09 DIAGNOSIS — Z79899 Other long term (current) drug therapy: Secondary | ICD-10-CM | POA: Insufficient documentation

## 2015-03-09 DIAGNOSIS — C50911 Malignant neoplasm of unspecified site of right female breast: Secondary | ICD-10-CM

## 2015-03-09 DIAGNOSIS — J449 Chronic obstructive pulmonary disease, unspecified: Secondary | ICD-10-CM | POA: Insufficient documentation

## 2015-03-09 DIAGNOSIS — C50919 Malignant neoplasm of unspecified site of unspecified female breast: Secondary | ICD-10-CM

## 2015-03-09 DIAGNOSIS — Z87891 Personal history of nicotine dependence: Secondary | ICD-10-CM | POA: Diagnosis not present

## 2015-03-09 DIAGNOSIS — Z170 Estrogen receptor positive status [ER+]: Secondary | ICD-10-CM | POA: Insufficient documentation

## 2015-03-09 DIAGNOSIS — K589 Irritable bowel syndrome without diarrhea: Secondary | ICD-10-CM | POA: Insufficient documentation

## 2015-03-09 LAB — COMPREHENSIVE METABOLIC PANEL
ALT: 25 U/L (ref 14–54)
AST: 44 U/L — ABNORMAL HIGH (ref 15–41)
Albumin: 3.7 g/dL (ref 3.5–5.0)
Alkaline Phosphatase: 72 U/L (ref 38–126)
Anion gap: 6 (ref 5–15)
BUN: 20 mg/dL (ref 6–20)
CO2: 28 mmol/L (ref 22–32)
Calcium: 9 mg/dL (ref 8.9–10.3)
Chloride: 102 mmol/L (ref 101–111)
Creatinine, Ser: 0.78 mg/dL (ref 0.44–1.00)
GFR calc Af Amer: >60 mL/min (ref >60)
GFR calc non Af Amer: >60 mL/min (ref >60)
Glucose, Bld: 87 mg/dL (ref 65–99)
Potassium: 3.6 mmol/L (ref 3.5–5.1)
Sodium: 136 mmol/L (ref 135–145)
Total Bilirubin: 0.4 mg/dL (ref 0.3–1.2)
Total Protein: 8.5 g/dL — ABNORMAL HIGH (ref 6.5–8.1)

## 2015-03-09 LAB — CBC WITH DIFFERENTIAL/PLATELET
Basophils Absolute: 0.1 10*3/uL (ref 0–0.1)
Basophils Relative: 1 %
Eosinophils Absolute: 0.1 10*3/uL (ref 0–0.7)
Eosinophils Relative: 1 %
HCT: 36.2 % (ref 35.0–47.0)
Hemoglobin: 12.6 g/dL (ref 12.0–16.0)
Lymphocytes Relative: 16 %
Lymphs Abs: 0.8 10*3/uL — ABNORMAL LOW (ref 1.0–3.6)
MCH: 31.3 pg (ref 26.0–34.0)
MCHC: 34.7 g/dL (ref 32.0–36.0)
MCV: 90.4 fL (ref 80.0–100.0)
Monocytes Absolute: 0.3 10*3/uL (ref 0.2–0.9)
Monocytes Relative: 6 %
Neutro Abs: 3.7 10*3/uL (ref 1.4–6.5)
Neutrophils Relative %: 76 %
Platelets: 176 10*3/uL (ref 150–440)
RBC: 4.01 MIL/uL (ref 3.80–5.20)
RDW: 16.3 % — ABNORMAL HIGH (ref 11.5–14.5)
WBC: 4.9 10*3/uL (ref 3.6–11.0)

## 2015-03-09 LAB — MAGNESIUM: Magnesium: 1.8 mg/dL (ref 1.7–2.4)

## 2015-03-09 MED ORDER — HEPARIN SOD (PORK) LOCK FLUSH 100 UNIT/ML IV SOLN
500.0000 [IU] | Freq: Once | INTRAVENOUS | Status: AC | PRN
  Administered 2015-03-09: 500 [IU]

## 2015-03-09 MED ORDER — SODIUM CHLORIDE 0.9 % IV SOLN
Freq: Once | INTRAVENOUS | Status: AC
Start: 2015-03-09 — End: 2015-03-09
  Administered 2015-03-09: 11:00:00 via INTRAVENOUS
  Filled 2015-03-09: qty 1000

## 2015-03-09 MED ORDER — HEPARIN SOD (PORK) LOCK FLUSH 100 UNIT/ML IV SOLN
500.0000 [IU] | Freq: Once | INTRAVENOUS | Status: DC
  Filled 2015-03-09: qty 5

## 2015-03-09 MED ORDER — ACETAMINOPHEN 325 MG PO TABS
650.0000 mg | ORAL_TABLET | Freq: Once | ORAL | Status: AC
  Administered 2015-03-09: 650 mg via ORAL
  Filled 2015-03-09: qty 2

## 2015-03-09 MED ORDER — SODIUM CHLORIDE 0.9 % IV SOLN
3.6000 mg/kg | Freq: Once | INTRAVENOUS | Status: AC
  Administered 2015-03-09: 200 mg via INTRAVENOUS
  Filled 2015-03-09: qty 10

## 2015-03-09 MED ORDER — DIPHENHYDRAMINE HCL 25 MG PO CAPS
50.0000 mg | ORAL_CAPSULE | Freq: Once | ORAL | Status: AC
  Administered 2015-03-09: 50 mg via ORAL
  Filled 2015-03-09: qty 2

## 2015-03-09 MED ORDER — SODIUM CHLORIDE 0.9% FLUSH
10.0000 mL | INTRAVENOUS | Status: DC | PRN
  Administered 2015-03-09: 10 mL via INTRAVENOUS
  Filled 2015-03-09: qty 10

## 2015-03-09 NOTE — Progress Notes (Signed)
[facility] day:  03/09/2015   Chief Complaint: Elizabeth Bond is a 65 y.o. female with metastatic Her2/neu positive right breast cancer who is seen for assessment prior to cycle #9 Kadcyla.  HPI:  The patient was last seen in the medical oncology clinic on 02/16/2015.  At that time, she received cycle #8 Kadcyla.  PET scan revealed a complete metabolic response.  CA27.29 was 52.7 on 02/15/2015.  Symptomatically, she has done well.  She voices no concerns.  Past Medical History  Diagnosis Date  . IBS (irritable bowel syndrome)   . Cancer ([place]) 2015    breast and bone  . Breast cancer ([place])     right  . Metastasis to bone ([place]) 09/01/2014  . COPD (chronic obstructive pulmonary disease) [facility])     Past Surgical History  Procedure Laterality Date  . Replacement total knee Left 2004-2005?  Marland Kitchen Abdominal hysterectomy  1978  . Portacath placement  2015    Family History  Problem Relation Age of Onset  . Cancer Father     prostate  . Cancer Maternal Aunt     breast  . Cancer Cousin     breast  . Cancer Other     lung cancer - neice  . Cancer - Other Daughter     brain  . Cancer Mother     Pancreatic    Social History:  reports that she quit smoking about 23 months ago. Her smoking use included Cigarettes. She has a 10 pack-year smoking history. She has never used smokeless tobacco. She reports that she does not drink alcohol or use illicit drugs.  The patient is alone today.  Allergies:  Allergies  Allergen Reactions  . Fentanyl Other (See Comments)    "loopy and confused"    Current Medications: Current Outpatient Prescriptions  Medication Sig Dispense Refill  . acetaminophen (TYLENOL) 500 MG tablet Take 1,000 mg by mouth every 8 (eight) hours as needed for headache.    . calcium carbonate (OS-CAL) 600 MG TABS tablet Take 1 tablet (600 mg total) by mouth 2 (two) times daily with a meal. 60 tablet 3  . clonazePAM (KLONOPIN) 0.5  MG tablet Take 1 tablet (0.5 mg total) by mouth 3 (three) times daily as needed for anxiety. 90 tablet 2  . furosemide (LASIX) 20 MG tablet TAKE ONE TABLET BY MOUTH EVERY OTHER DAYAS NEEDED FOR SWELLING 30 tablet 1  . ondansetron (ZOFRAN) 4 MG tablet Take 1 tablet (4 mg total) by mouth every 8 (eight) hours as needed for nausea or vomiting. 20 tablet 1  . oxycodone (OXY-IR) 5 MG capsule Take 1 tablet every 4 hours by mouth as needed for pain 20 capsule 0  . oxyCODONE (OXYCONTIN) 15 mg 12 hr tablet Take 1 tablet (15 mg total) by mouth every 12 (twelve) hours. 60 tablet 0  . pantoprazole (PROTONIX) 40 MG tablet Take 1 tablet (40 mg total) by mouth daily. 30 tablet 6  . phosphorus (PHOSPHA 250 NEUTRAL) 155-852-130 MG tablet Take 1 tablet (250 mg total) by mouth 2 (two) times daily. 60 tablet 2  . potassium chloride (K-DUR) 10 MEQ tablet Take 1 tablet (10 mEq total) by mouth daily. 30 tablet 2   No current facility-administered medications for this visit.   Facility-Administered Medications Ordered in Other Visits  Medication Dose Route Frequency Provider Last Rate Last Dose  . heparin lock flush 100 unit/mL  500 Units Intravenous Once Lequita Asal,  MD      . sodium chloride flush (NS) 0.9 % injection 10 mL  10 mL Intravenous PRN Lequita Asal, MD   10 mL at 03/09/15 0900    Review of Systems:  GENERAL:  Feels good.  No fevers or sweats.  Weight stable. PERFORMANCE STATUS (ECOG):  0 HEENT:  No visual changes, runny nose, sore throat, mouth sores or tenderness. Lungs: No shortness of breath or cough.  No hemoptysis. Cardiac:  No chest pain, palpitations, orthopnea, or PND. GI:  No nausea, vomiting, diarrhea, constipation, melena or hematochezia. GU:  No urgency, frequency, dysuria, or hematuria. Musculoskeletal:  No back pain.  No joint pain.  No muscle tenderness. Extremities:  No pain or swelling. Skin:  No rashes or skin changes. Neuro:  No headache, numbness or weakness, balance  or coordination issues. Endocrine:  No diabetes, thyroid issues, hot flashes or night sweats. Psych:  No mood changes, depression or anxiety. Pain:  No focal pain. Review of systems:  All other systems reviewed and found to be negative.   Physical Exam: Blood pressure 153/91, pulse 77, temperature 97.1 F (36.2 C), temperature source Tympanic, resp. rate 18, height 5' 4"  (1.626 m), weight 113 lb 1.5 oz (51.3 kg). GENERAL:  Well developed, well nourished, sitting comfortably in the exam room in no acute distress. MENTAL STATUS:  Alert and oriented to person, place and time. HEAD:  Short styled gray hair.  Normocephalic, atraumatic, face symmetric, no Cushingoid features. EYES: Hazel eyes.  Pupils equal round and reactive to light and accomodation.  No conjunctivitis or scleral icterus. ENT:  Oropharynx clear without lesion.  Tongue normal. Mucous membranes moist.  RESPIRATORY:  Clear to auscultation without rales, wheezes or rhonchi. CARDIOVASCULAR:  Regular rate and rhythm without murmur, rub or gallop.  No JVD. ABDOMEN:  Soft, non-tender, with active bowel sounds, and no hepatosplenomegaly.  No masses. SKIN:  No rashes, ulcers or lesions. EXTREMITIES: No edema, no skin discoloration or tenderness.  No palpable cords. LYMPH NODES: No palpable cervical, supraclavicular, axillary or inguinal adenopathy  NEUROLOGICAL: Unremarkable. PSYCH:  Appropriate.  NEUROLOGICAL: Unremarkable.  Infusion on 03/09/2015  Component Date Value Ref Range Status  . WBC 03/09/2015 4.9  3.6 - 11.0 K/uL Final  . RBC 03/09/2015 4.01  3.80 - 5.20 MIL/uL Final  . Hemoglobin 03/09/2015 12.6  12.0 - 16.0 g/dL Final  . HCT 03/09/2015 36.2  35.0 - 47.0 % Final  . MCV 03/09/2015 90.4  80.0 - 100.0 fL Final  . MCH 03/09/2015 31.3  26.0 - 34.0 pg Final  . MCHC 03/09/2015 34.7  32.0 - 36.0 g/dL Final  . RDW 03/09/2015 16.3* 11.5 - 14.5 % Final  . Platelets 03/09/2015 176  150 - 440 K/uL Final  . Neutrophils Relative %  03/09/2015 76   Final  . Neutro Abs 03/09/2015 3.7  1.4 - 6.5 K/uL Final  . Lymphocytes Relative 03/09/2015 16   Final  . Lymphs Abs 03/09/2015 0.8* 1.0 - 3.6 K/uL Final  . Monocytes Relative 03/09/2015 6   Final  . Monocytes Absolute 03/09/2015 0.3  0.2 - 0.9 K/uL Final  . Eosinophils Relative 03/09/2015 1   Final  . Eosinophils Absolute 03/09/2015 0.1  0 - 0.7 K/uL Final  . Basophils Relative 03/09/2015 1   Final  . Basophils Absolute 03/09/2015 0.1  0 - 0.1 K/uL Final  . Sodium 03/09/2015 136  135 - 145 mmol/L Final  . Potassium 03/09/2015 3.6  3.5 - 5.1 mmol/L Final  .  Chloride 03/09/2015 102  101 - 111 mmol/L Final  . CO2 03/09/2015 28  22 - 32 mmol/L Final  . Glucose, Bld 03/09/2015 87  65 - 99 mg/dL Final  . BUN 03/09/2015 20  6 - 20 mg/dL Final  . Creatinine, Ser 03/09/2015 0.78  0.44 - 1.00 mg/dL Final  . Calcium 03/09/2015 9.0  8.9 - 10.3 mg/dL Final  . Total Protein 03/09/2015 8.5* 6.5 - 8.1 g/dL Final  . Albumin 03/09/2015 3.7  3.5 - 5.0 g/dL Final  . AST 03/09/2015 44* 15 - 41 U/L Final  . ALT 03/09/2015 25  14 - 54 U/L Final  . Alkaline Phosphatase 03/09/2015 72  38 - 126 U/L Final  . Total Bilirubin 03/09/2015 0.4  0.3 - 1.2 mg/dL Final  . GFR calc non Af Amer 03/09/2015 >60  >60 mL/min Final  . GFR calc Af Amer 03/09/2015 >60  >60 mL/min Final   Comment: (NOTE) The eGFR has been calculated using the CKD EPI equation. This calculation has not been validated in all clinical situations. eGFR's persistently <60 mL/min signify possible Chronic Kidney Disease.   . Anion gap 03/09/2015 6  5 - 15 Final  . Magnesium 03/09/2015 1.8  1.7 - 2.4 mg/dL Final    Assessment:  Elizabeth Bond is a 65 y.o. female with metastatic Her2/neu positive right breast cancer.  She initially presented in 03/2013 with an ulcerated breast mass and back pain.  Breast biopsy revealed lobular breast cancer.  Tumor was ER/PR positive and HER-2/neu positive. PET scan revealed bone metastasis. She  received palliative radiation to T8 and T10 from 04/02-04/15/2015.  She declined chemotherapy. She initially began letrozole and Tykerb. Tykerb was discontinued after 1 dose. She received letrozole from 05/02/2013 until 01/07/2014 .  She received Herceptin from 06/02/2013 until 12/15/2013. She has received fulvestrant monthly from 01/07/2014 until 05/27/2014.  She has received Xgeva monthly (03/31/2013 until 08/25/2014; last 01/26/2015).  She was switched to every 6 weeks Xgeva to coordinate with her treatment.  Herceptin was discontinued for a rising CA-27-29.  CA27.29 was 916.3 on 03/19/2013, 286.2 on 06/23/2013, 70.8 on 08/25/2013, 56.3 on 10/08/2013, 102.7 on 12/15/2013, 149.4 on 01/07/2014, 178.0 on 02/04/2014, 190.7 on 03/30/2014, 143.2 on 04/22/2014, 134.8 on 05/20/2014, 248.3 on 07/09/2014, 723.2 on 08/25/2014, 252.3 on 10/08/2014, 126 on 10/29/2014, 71.3 on 11/24/2014, 55 on 12/15/2014, 70.6 on 01/05/2015, 48.6 on 01/26/2015, and 52.7 on 02/15/2015.   She received Ibrance and Faslodex from 02/23/2014 until 07/27/2014.  Echo on 09/04/2014 revealed an EF of 55-65%.  Echo on 12/14/2014 revealed an EF of 55-60%.  PET scan on 08/28/2014 revealed significant interval worsening and multifocal osseous metastatic disease. Lesions had increased in number and metabolic activity. There were no extra osseous metastasis.  She is s/p 8 cycles of Kadcyla (09/10/2014-02/16/2015).  She is tolerating treatment well.  She denies any bone pain.   PET scan on 02/08/2015 revealed a complete metabolic response in the sclerotic osseous metastases, with no residual hypermetabolic metastatic disease.  She was admitted from 12/22/2014 - 12/24/2014 with E coli sepsis secondary to a UTI.  She was treated with Augmentin.  Serum protein is elevated.  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  Symptomatically, she feels good.  Exam is unremarkable.  Plan: 1.  Labs today:  CBC with diff, CMP, Mg. 2.  Cycle #9  Kadcyla. 3.  Xgeva today. 4.  Follow-up echo scheduled for 03/15/2015.  Continue every 3 months. 5.  RTC in 3 weeks for MD  assessment, labs (CBC with diff, CMP, Mg, CA27.29), and cycle #10 Kadcyla (TDM-1; trastuzumab emtansine).   Lequita Asal, MD  03/09/2015, 9:34 AM

## 2015-03-11 ENCOUNTER — Other Ambulatory Visit: Payer: Self-pay | Admitting: *Deleted

## 2015-03-11 DIAGNOSIS — C50919 Malignant neoplasm of unspecified site of unspecified female breast: Secondary | ICD-10-CM

## 2015-03-12 MED ORDER — OXYCODONE HCL 5 MG PO CAPS: ORAL_CAPSULE | ORAL | Status: DC

## 2015-03-12 MED ORDER — K PHOS MONO-SOD PHOS DI & MONO 155-852-130 MG PO TABS: 250.0000 mg | ORAL_TABLET | Freq: Two times a day (BID) | ORAL | Status: DC

## 2015-03-12 MED ORDER — POTASSIUM CHLORIDE ER 10 MEQ PO TBCR: 10.0000 meq | EXTENDED_RELEASE_TABLET | Freq: Every day | ORAL | Status: DC

## 2015-03-12 MED ORDER — CLONAZEPAM 0.5 MG PO TABS: 0.5000 mg | ORAL_TABLET | Freq: Three times a day (TID) | ORAL | Status: DC | PRN

## 2015-03-12 MED ORDER — PANTOPRAZOLE SODIUM 40 MG PO TBEC: 40.0000 mg | DELAYED_RELEASE_TABLET | Freq: Every day | ORAL | Status: DC

## 2015-03-15 ENCOUNTER — Ambulatory Visit
Admission: RE | Admit: 2015-03-15 | Discharge: 2015-03-15 | Disposition: A | Discharge status: Home / Self Care | Payer: Medicare Other | Source: Ambulatory Visit | Attending: Hematology and Oncology | Admitting: Hematology and Oncology

## 2015-03-15 DIAGNOSIS — I517 Cardiomegaly: Secondary | ICD-10-CM | POA: Insufficient documentation

## 2015-03-15 DIAGNOSIS — I371 Nonrheumatic pulmonary valve insufficiency: Secondary | ICD-10-CM | POA: Diagnosis not present

## 2015-03-15 DIAGNOSIS — C50919 Malignant neoplasm of unspecified site of unspecified female breast: Secondary | ICD-10-CM

## 2015-03-15 DIAGNOSIS — R0602 Shortness of breath: Secondary | ICD-10-CM | POA: Diagnosis not present

## 2015-03-29 ENCOUNTER — Other Ambulatory Visit: Payer: Self-pay | Admitting: *Deleted

## 2015-03-29 DIAGNOSIS — C7951 Secondary malignant neoplasm of bone: Secondary | ICD-10-CM

## 2015-03-29 DIAGNOSIS — C50911 Malignant neoplasm of unspecified site of right female breast: Secondary | ICD-10-CM

## 2015-03-30 ENCOUNTER — Inpatient Hospital Stay (HOSPITAL_BASED_OUTPATIENT_CLINIC_OR_DEPARTMENT_OTHER): Payer: Medicare Other | Admitting: Hematology and Oncology

## 2015-03-30 ENCOUNTER — Inpatient Hospital Stay: Payer: Medicare Other

## 2015-03-30 VITALS — BP 151/85 | HR 69 | Temp 97.1°F | Resp 18 | Wt 113.1 lb

## 2015-03-30 DIAGNOSIS — C50911 Malignant neoplasm of unspecified site of right female breast: Secondary | ICD-10-CM

## 2015-03-30 DIAGNOSIS — Z5112 Encounter for antineoplastic immunotherapy: Secondary | ICD-10-CM | POA: Diagnosis not present

## 2015-03-30 DIAGNOSIS — C50919 Malignant neoplasm of unspecified site of unspecified female breast: Secondary | ICD-10-CM

## 2015-03-30 DIAGNOSIS — Z79899 Other long term (current) drug therapy: Secondary | ICD-10-CM

## 2015-03-30 DIAGNOSIS — C7951 Secondary malignant neoplasm of bone: Secondary | ICD-10-CM

## 2015-03-30 DIAGNOSIS — Z923 Personal history of irradiation: Secondary | ICD-10-CM | POA: Diagnosis not present

## 2015-03-30 DIAGNOSIS — Z170 Estrogen receptor positive status [ER+]: Secondary | ICD-10-CM | POA: Diagnosis not present

## 2015-03-30 LAB — CBC WITH DIFFERENTIAL/PLATELET
Basophils Absolute: 0 10*3/uL (ref 0–0.1)
Basophils Relative: 0 %
Eosinophils Absolute: 0.1 10*3/uL (ref 0–0.7)
Eosinophils Relative: 1 %
HCT: 35.4 % (ref 35.0–47.0)
Hemoglobin: 12.1 g/dL (ref 12.0–16.0)
Lymphocytes Relative: 15 %
Lymphs Abs: 0.7 10*3/uL — ABNORMAL LOW (ref 1.0–3.6)
MCH: 31.6 pg (ref 26.0–34.0)
MCHC: 34.2 g/dL (ref 32.0–36.0)
MCV: 92.3 fL (ref 80.0–100.0)
Monocytes Absolute: 0.4 10*3/uL (ref 0.2–0.9)
Monocytes Relative: 8 %
Neutro Abs: 3.8 10*3/uL (ref 1.4–6.5)
Neutrophils Relative %: 76 %
Platelets: 176 10*3/uL (ref 150–440)
RBC: 3.84 MIL/uL (ref 3.80–5.20)
RDW: 15.4 % — ABNORMAL HIGH (ref 11.5–14.5)
WBC: 5 10*3/uL (ref 3.6–11.0)

## 2015-03-30 LAB — COMPREHENSIVE METABOLIC PANEL
ALT: 22 U/L (ref 14–54)
AST: 42 U/L — ABNORMAL HIGH (ref 15–41)
Albumin: 3.9 g/dL (ref 3.5–5.0)
Alkaline Phosphatase: 83 U/L (ref 38–126)
Anion gap: 5 (ref 5–15)
BUN: 20 mg/dL (ref 6–20)
CO2: 29 mmol/L (ref 22–32)
Calcium: 9.3 mg/dL (ref 8.9–10.3)
Chloride: 100 mmol/L — ABNORMAL LOW (ref 101–111)
Creatinine, Ser: 0.94 mg/dL (ref 0.44–1.00)
GFR calc Af Amer: >60 mL/min (ref >60)
GFR calc non Af Amer: >60 mL/min (ref >60)
Glucose, Bld: 86 mg/dL (ref 65–99)
Potassium: 3.8 mmol/L (ref 3.5–5.1)
Sodium: 134 mmol/L — ABNORMAL LOW (ref 135–145)
Total Bilirubin: 0.6 mg/dL (ref 0.3–1.2)
Total Protein: 8.8 g/dL — ABNORMAL HIGH (ref 6.5–8.1)

## 2015-03-30 LAB — MAGNESIUM: Magnesium: 1.6 mg/dL — ABNORMAL LOW (ref 1.7–2.4)

## 2015-03-30 MED ORDER — SODIUM CHLORIDE 0.9 % IV SOLN
3.6000 mg/kg | Freq: Once | INTRAVENOUS | Status: AC
  Administered 2015-03-30: 200 mg via INTRAVENOUS
  Filled 2015-03-30: qty 10

## 2015-03-30 MED ORDER — ACETAMINOPHEN 325 MG PO TABS
650.0000 mg | ORAL_TABLET | Freq: Once | ORAL | Status: AC
  Administered 2015-03-30: 650 mg via ORAL
  Filled 2015-03-30: qty 2

## 2015-03-30 MED ORDER — DIPHENHYDRAMINE HCL 25 MG PO CAPS
50.0000 mg | ORAL_CAPSULE | Freq: Once | ORAL | Status: AC
  Administered 2015-03-30: 50 mg via ORAL
  Filled 2015-03-30: qty 2

## 2015-03-30 MED ORDER — DENOSUMAB 120 MG/1.7ML ~~LOC~~ SOLN
120.0000 mg | Freq: Once | SUBCUTANEOUS | Status: AC
  Administered 2015-03-30: 120 mg via SUBCUTANEOUS
  Filled 2015-03-30: qty 1.7

## 2015-03-30 MED ORDER — OXYCODONE HCL ER 10 MG PO T12A: 10.0000 mg | EXTENDED_RELEASE_TABLET | Freq: Two times a day (BID) | ORAL | Status: DC

## 2015-03-30 MED ORDER — SODIUM CHLORIDE 0.9% FLUSH
10.0000 mL | INTRAVENOUS | Status: DC | PRN
  Administered 2015-03-30: 10 mL via INTRAVENOUS
  Filled 2015-03-30: qty 10

## 2015-03-30 MED ORDER — SODIUM CHLORIDE 0.9 % IV SOLN
INTRAVENOUS | Status: DC
  Administered 2015-03-30: 12:00:00 via INTRAVENOUS
  Filled 2015-03-30: qty 1000

## 2015-03-30 MED ORDER — HEPARIN SOD (PORK) LOCK FLUSH 100 UNIT/ML IV SOLN
500.0000 [IU] | Freq: Once | INTRAVENOUS | Status: AC
  Administered 2015-03-30: 500 [IU] via INTRAVENOUS
  Filled 2015-03-30: qty 5

## 2015-03-30 NOTE — Progress Notes (Signed)
Patient is having nasal congestion.  She is requesting a refill on the Oxycontin 15mg  today.  Blood pressure is elevated today at 151/85.

## 2015-03-31 LAB — CANCER ANTIGEN 27.29: CA 27.29: 54.7 U/mL — ABNORMAL HIGH (ref 0.0–38.6)

## 2015-04-20 ENCOUNTER — Inpatient Hospital Stay: Payer: Medicare Other | Attending: Hematology and Oncology

## 2015-04-20 ENCOUNTER — Encounter: Payer: Self-pay | Admitting: Hematology and Oncology

## 2015-04-20 ENCOUNTER — Telehealth: Payer: Self-pay

## 2015-04-20 ENCOUNTER — Inpatient Hospital Stay (HOSPITAL_BASED_OUTPATIENT_CLINIC_OR_DEPARTMENT_OTHER): Payer: Medicare Other | Admitting: Hematology and Oncology

## 2015-04-20 ENCOUNTER — Inpatient Hospital Stay: Payer: Medicare Other

## 2015-04-20 ENCOUNTER — Other Ambulatory Visit: Payer: Self-pay | Admitting: Hematology and Oncology

## 2015-04-20 VITALS — BP 143/83 | HR 78 | Temp 94.3°F | Resp 18 | Ht 64.0 in | Wt 115.6 lb

## 2015-04-20 DIAGNOSIS — C7951 Secondary malignant neoplasm of bone: Secondary | ICD-10-CM | POA: Diagnosis not present

## 2015-04-20 DIAGNOSIS — K589 Irritable bowel syndrome without diarrhea: Secondary | ICD-10-CM | POA: Insufficient documentation

## 2015-04-20 DIAGNOSIS — C50919 Malignant neoplasm of unspecified site of unspecified female breast: Secondary | ICD-10-CM

## 2015-04-20 DIAGNOSIS — Z170 Estrogen receptor positive status [ER+]: Secondary | ICD-10-CM

## 2015-04-20 DIAGNOSIS — Z79899 Other long term (current) drug therapy: Secondary | ICD-10-CM

## 2015-04-20 DIAGNOSIS — Z87891 Personal history of nicotine dependence: Secondary | ICD-10-CM | POA: Insufficient documentation

## 2015-04-20 DIAGNOSIS — C50911 Malignant neoplasm of unspecified site of right female breast: Secondary | ICD-10-CM

## 2015-04-20 DIAGNOSIS — J449 Chronic obstructive pulmonary disease, unspecified: Secondary | ICD-10-CM | POA: Diagnosis not present

## 2015-04-20 DIAGNOSIS — J302 Other seasonal allergic rhinitis: Secondary | ICD-10-CM | POA: Insufficient documentation

## 2015-04-20 DIAGNOSIS — Z5112 Encounter for antineoplastic immunotherapy: Secondary | ICD-10-CM | POA: Insufficient documentation

## 2015-04-20 LAB — CBC WITH DIFFERENTIAL/PLATELET
Basophils Absolute: 0 10*3/uL (ref 0–0.1)
Basophils Relative: 1 %
Eosinophils Absolute: 0.1 10*3/uL (ref 0–0.7)
Eosinophils Relative: 2 %
HCT: 36.6 % (ref 35.0–47.0)
Hemoglobin: 12.5 g/dL (ref 12.0–16.0)
Lymphocytes Relative: 18 %
Lymphs Abs: 0.9 10*3/uL — ABNORMAL LOW (ref 1.0–3.6)
MCH: 30.9 pg (ref 26.0–34.0)
MCHC: 34.3 g/dL (ref 32.0–36.0)
MCV: 90.2 fL (ref 80.0–100.0)
Monocytes Absolute: 0.4 10*3/uL (ref 0.2–0.9)
Monocytes Relative: 8 %
Neutro Abs: 3.6 10*3/uL (ref 1.4–6.5)
Neutrophils Relative %: 71 %
Platelets: 154 10*3/uL (ref 150–440)
RBC: 4.06 MIL/uL (ref 3.80–5.20)
RDW: 15.5 % — ABNORMAL HIGH (ref 11.5–14.5)
WBC: 5 10*3/uL (ref 3.6–11.0)

## 2015-04-20 LAB — COMPREHENSIVE METABOLIC PANEL
ALT: 23 U/L (ref 14–54)
AST: 46 U/L — ABNORMAL HIGH (ref 15–41)
Albumin: 3.8 g/dL (ref 3.5–5.0)
Alkaline Phosphatase: 85 U/L (ref 38–126)
Anion gap: 4 — ABNORMAL LOW (ref 5–15)
BUN: 27 mg/dL — ABNORMAL HIGH (ref 6–20)
CO2: 28 mmol/L (ref 22–32)
Calcium: 9.2 mg/dL (ref 8.9–10.3)
Chloride: 104 mmol/L (ref 101–111)
Creatinine, Ser: 0.87 mg/dL (ref 0.44–1.00)
GFR calc Af Amer: >60 mL/min (ref >60)
GFR calc non Af Amer: >60 mL/min (ref >60)
Glucose, Bld: 96 mg/dL (ref 65–99)
Potassium: 3.6 mmol/L (ref 3.5–5.1)
Sodium: 136 mmol/L (ref 135–145)
Total Bilirubin: 0.5 mg/dL (ref 0.3–1.2)
Total Protein: 8.4 g/dL — ABNORMAL HIGH (ref 6.5–8.1)

## 2015-04-20 LAB — MAGNESIUM: Magnesium: 1.8 mg/dL (ref 1.7–2.4)

## 2015-04-20 MED ORDER — SODIUM CHLORIDE 0.9 % IV SOLN
3.6000 mg/kg | Freq: Once | INTRAVENOUS | Status: AC
  Administered 2015-04-20: 200 mg via INTRAVENOUS
  Filled 2015-04-20: qty 10

## 2015-04-20 MED ORDER — ACETAMINOPHEN 325 MG PO TABS
650.0000 mg | ORAL_TABLET | Freq: Once | ORAL | Status: AC
  Administered 2015-04-20: 650 mg via ORAL
  Filled 2015-04-20: qty 2

## 2015-04-20 MED ORDER — DIPHENHYDRAMINE HCL 25 MG PO CAPS
50.0000 mg | ORAL_CAPSULE | Freq: Once | ORAL | Status: AC
Start: 2015-04-20 — End: 2015-04-20
  Administered 2015-04-20: 50 mg via ORAL
  Filled 2015-04-20: qty 2

## 2015-04-20 MED ORDER — HEPARIN SOD (PORK) LOCK FLUSH 100 UNIT/ML IV SOLN
500.0000 [IU] | Freq: Once | INTRAVENOUS | Status: AC | PRN
  Administered 2015-04-20: 500 [IU]
  Filled 2015-04-20: qty 5

## 2015-04-20 MED ORDER — SODIUM CHLORIDE 0.9 % IV SOLN
Freq: Once | INTRAVENOUS | Status: AC
  Administered 2015-04-20: 11:00:00 via INTRAVENOUS
  Filled 2015-04-20: qty 1000

## 2015-04-20 NOTE — Progress Notes (Signed)
[place] [facility] day:  03/30/2015  Chief Complaint: Elizabeth Bond is a 65 y.o. female with metastatic Her2/neu positive right breast cancer who is seen for assessment prior to cycle #10 Kadcyla.  HPI:  The patient was last seen in the medical oncology clinic on 03/09/2015.  At that time, she received cycle #9 Kadcyla.  She tolerated her infusion well.  She continues to have screening echocardiograms every 3 months.  Echo on 03/15/2015 revealed an EF of 55-60%.  During the interim, she has done well.  She notes some minor sinus symptoms.  She is interested in tapering her pain medications.   Past Medical History  Diagnosis Date  . IBS (irritable bowel syndrome)   . Cancer ([place]) 2015    breast and bone  . Breast cancer ([place])     right  . Metastasis to bone ([place]) 09/01/2014  . COPD (chronic obstructive pulmonary disease) [facility])     Past Surgical History  Procedure Laterality Date  . Replacement total knee Left 2004-2005?  Marland Kitchen Abdominal hysterectomy  1978  . Portacath placement  2015    Family History  Problem Relation Age of Onset  . Cancer Father     prostate  . Cancer Maternal Aunt     breast  . Cancer Cousin     breast  . Cancer Other     lung cancer - neice  . Cancer - Other Daughter     brain  . Cancer Mother     Pancreatic    Social History:  reports that she quit smoking about 2 years ago. Her smoking use included Cigarettes. She has a 10 pack-year smoking history. She has never used smokeless tobacco. She reports that she does not drink alcohol or use illicit drugs.  The patient is alone today.  Allergies:  Allergies  Allergen Reactions  . Fentanyl Other (See Comments)    "loopy and confused"    Current Medications: Current Outpatient Prescriptions  Medication Sig Dispense Refill  . acetaminophen (TYLENOL) 500 MG tablet Take 1,000 mg by mouth every 8 (eight) hours as needed for headache.    . calcium carbonate  (OS-CAL) 600 MG TABS tablet Take 1 tablet (600 mg total) by mouth 2 (two) times daily with a meal. 60 tablet 3  . clonazePAM (KLONOPIN) 0.5 MG tablet Take 1 tablet (0.5 mg total) by mouth 3 (three) times daily as needed for anxiety. 90 tablet 2  . furosemide (LASIX) 20 MG tablet TAKE ONE TABLET BY MOUTH EVERY OTHER DAYAS NEEDED FOR SWELLING 30 tablet 1  . ondansetron (ZOFRAN) 4 MG tablet Take 1 tablet (4 mg total) by mouth every 8 (eight) hours as needed for nausea or vomiting. 20 tablet 1  . oxycodone (OXY-IR) 5 MG capsule Take 1 tablet every 4 hours by mouth as needed for pain 20 capsule 0  . oxyCODONE (OXYCONTIN) 10 mg 12 hr tablet Take 1 tablet (10 mg total) by mouth every 12 (twelve) hours. 60 tablet 0  . pantoprazole (PROTONIX) 40 MG tablet Take 1 tablet (40 mg total) by mouth daily. 30 tablet 6  . phosphorus (PHOSPHA 250 NEUTRAL) 155-852-130 MG tablet Take 1 tablet (250 mg total) by mouth 2 (two) times daily. 60 tablet 2  . potassium chloride (K-DUR) 10 MEQ tablet Take 1 tablet (10 mEq total) by mouth daily. 30 tablet 2   No current facility-administered medications for this visit.    Review of Systems:  GENERAL:  Feels good.  No fevers or sweats.  Weight stable. PERFORMANCE STATUS (ECOG):  0 HEENT:  Nasal congestion.  No visual changes, runny nose, sore throat, mouth sores or tenderness. Lungs: No shortness of breath or cough.  No hemoptysis. Cardiac:  No chest pain, palpitations, orthopnea, or PND. GI:  Appetite 75% normal.  No nausea, vomiting, diarrhea, constipation, melena or hematochezia. GU:  No urgency, frequency, dysuria, or hematuria. Musculoskeletal:  No back pain.  No joint pain.  No muscle tenderness. Extremities:  No pain or swelling. Skin:  No rashes or skin changes. Neuro:  No headache, numbness or weakness, balance or coordination issues. Endocrine:  No diabetes, thyroid issues, hot flashes or night sweats. Psych:  No mood changes, depression or anxiety. Pain:  No  focal pain. Review of systems:  All other systems reviewed and found to be negative.   Physical Exam: Blood pressure 151/85, pulse 69, temperature 97.1 F (36.2 C), temperature source Tympanic, resp. rate 18, weight 113 lb 1.5 oz (51.3 kg). GENERAL:  Well developed, well nourished, sitting comfortably in the exam room in no acute distress. MENTAL STATUS:  Alert and oriented to person, place and time. HEAD:  Short styled gray hair.  Normocephalic, atraumatic, face symmetric, no Cushingoid features. EYES: Hazel eyes.  Pupils equal round and reactive to light and accomodation.  No conjunctivitis or scleral icterus. ENT:  Oropharynx clear without lesion.  Tongue normal. Mucous membranes moist.  RESPIRATORY:  Clear to auscultation without rales, wheezes or rhonchi. CARDIOVASCULAR:  Regular rate and rhythm without murmur, rub or gallop.  No JVD. ABDOMEN:  Soft, non-tender, with active bowel sounds, and no hepatosplenomegaly.  No masses. SKIN:  No rashes, ulcers or lesions. EXTREMITIES: No edema, no skin discoloration or tenderness.  No palpable cords. LYMPH NODES: No palpable cervical, supraclavicular, axillary or inguinal adenopathy  NEUROLOGICAL: Unremarkable.  Limp secondary to knee. PSYCH:  Appropriate.  NEUROLOGICAL: Unremarkable.  Infusion on 03/30/2015  Component Date Value Ref Range Status  . WBC 03/30/2015 5.0  3.6 - 11.0 K/uL Final  . RBC 03/30/2015 3.84  3.80 - 5.20 MIL/uL Final  . Hemoglobin 03/30/2015 12.1  12.0 - 16.0 g/dL Final  . HCT 03/30/2015 35.4  35.0 - 47.0 % Final  . MCV 03/30/2015 92.3  80.0 - 100.0 fL Final  . MCH 03/30/2015 31.6  26.0 - 34.0 pg Final  . MCHC 03/30/2015 34.2  32.0 - 36.0 g/dL Final  . RDW 03/30/2015 15.4* 11.5 - 14.5 % Final  . Platelets 03/30/2015 176  150 - 440 K/uL Final  . Neutrophils Relative % 03/30/2015 76   Final  . Neutro Abs 03/30/2015 3.8  1.4 - 6.5 K/uL Final  . Lymphocytes Relative 03/30/2015 15   Final  . Lymphs Abs 03/30/2015 0.7*  1.0 - 3.6 K/uL Final  . Monocytes Relative 03/30/2015 8   Final  . Monocytes Absolute 03/30/2015 0.4  0.2 - 0.9 K/uL Final  . Eosinophils Relative 03/30/2015 1   Final  . Eosinophils Absolute 03/30/2015 0.1  0 - 0.7 K/uL Final  . Basophils Relative 03/30/2015 0   Final  . Basophils Absolute 03/30/2015 0.0  0 - 0.1 K/uL Final  . Sodium 03/30/2015 134* 135 - 145 mmol/L Final  . Potassium 03/30/2015 3.8  3.5 - 5.1 mmol/L Final  . Chloride 03/30/2015 100* 101 - 111 mmol/L Final  . CO2 03/30/2015 29  22 - 32 mmol/L Final  . Glucose, Bld 03/30/2015 86  65 - 99 mg/dL Final  . BUN  03/30/2015 20  6 - 20 mg/dL Final  . Creatinine, Ser 03/30/2015 0.94  0.44 - 1.00 mg/dL Final  . Calcium 03/30/2015 9.3  8.9 - 10.3 mg/dL Final  . Total Protein 03/30/2015 8.8* 6.5 - 8.1 g/dL Final  . Albumin 03/30/2015 3.9  3.5 - 5.0 g/dL Final  . AST 03/30/2015 42* 15 - 41 U/L Final  . ALT 03/30/2015 22  14 - 54 U/L Final  . Alkaline Phosphatase 03/30/2015 83  38 - 126 U/L Final  . Total Bilirubin 03/30/2015 0.6  0.3 - 1.2 mg/dL Final  . GFR calc non Af Amer 03/30/2015 >60  >60 mL/min Final  . GFR calc Af Amer 03/30/2015 >60  >60 mL/min Final   Comment: (NOTE) The eGFR has been calculated using the CKD EPI equation. This calculation has not been validated in all clinical situations. eGFR's persistently <60 mL/min signify possible Chronic Kidney Disease.   . Anion gap 03/30/2015 5  5 - 15 Final  . Magnesium 03/30/2015 1.6* 1.7 - 2.4 mg/dL Final  . CA 27.29 03/30/2015 54.7* 0.0 - 38.6 U/mL Final   Comment: (NOTE) [employer]/ACS methodology Performed At: [facility] [address] [place], [place] [id number] Lindon Romp MD LD:[id number]     Assessment:  Elizabeth Bond is a 65 y.o. female with metastatic Her2/neu positive right breast cancer.  She initially presented in 03/2013 with an ulcerated breast mass and back pain.  Breast biopsy revealed lobular breast cancer.  Tumor was ER/PR positive  and HER-2/neu positive. PET scan revealed bone metastasis. She received palliative radiation to T8 and T10 from 04/02-04/15/2015.  She declined chemotherapy. She initially began letrozole and Tykerb. Tykerb was discontinued after 1 dose. She received letrozole from 05/02/2013 until 01/07/2014 .  She received Herceptin from 06/02/2013 until 12/15/2013. She has received fulvestrant monthly from 01/07/2014 until 05/27/2014.  She has received Xgeva monthly (03/31/2013 until 08/25/2014; last 01/26/2015).  She was switched to every 6 weeks Xgeva to coordinate with her treatment.  Herceptin was discontinued for a rising CA-27-29.  CA27.29 was 916.3 on 03/19/2013, 286.2 on 06/23/2013, 70.8 on 08/25/2013, 56.3 on 10/08/2013, 102.7 on 12/15/2013, 149.4 on 01/07/2014, 178.0 on 02/04/2014, 190.7 on 03/30/2014, 143.2 on 04/22/2014, 134.8 on 05/20/2014, 248.3 on 07/09/2014, 723.2 on 08/25/2014, 252.3 on 10/08/2014, 126 on 10/29/2014, 71.3 on 11/24/2014, 55 on 12/15/2014, 70.6 on 01/05/2015, 48.6 on 01/26/2015, 52.7 on 02/15/2015, and 54.7 on 03/30/2015.   She received Ibrance and Faslodex from 02/23/2014 until 07/27/2014.  Echo on 09/04/2014 revealed an EF of 55-65%.  Echo on 12/14/2014 revealed an EF of 55-60%.  Echo on 03/15/2015 revealed an EF of 55-60%.  PET scan on 08/28/2014 revealed significant interval worsening and multifocal osseous metastatic disease. Lesions had increased in number and metabolic activity. There were no extra osseous metastasis.  She is s/p 9 cycles of Kadcyla (09/10/2014-03/09/2015).  She is tolerating treatment well.  She denies any bone pain.   PET scan on 02/08/2015 revealed a complete metabolic response in the sclerotic osseous metastases, with no residual hypermetabolic metastatic disease.  She was admitted from 12/22/2014 - 12/24/2014 with E coli sepsis secondary to a UTI.  She was treated with Augmentin.  Serum protein is elevated.  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  Symptomatically, she notes some nasal congestion.  She would like to taper her pain medications.  Exam is unremarkable.  Plan: 1.  Labs today:  CBC with diff, CMP, Mg, CA27.29. 2.  Cycle #10 Kadcyla. 3.  Xgeva today.  Continue every 6 weeks (every other treatment day). 4.  Discuss continuation of treatment.  Discuss chemotherapy holidays. 5.  Discuss blood pressure.  Patient notes white coat hypertension.  She will check blood pressure away from the hospital. 6.  Taper pain medications.  Rx:  Oxycontin 10 mg BID. 7.  RTC in 3 weeks for MD assessment, labs (CBC with diff, CMP, Mg, CA27.29), and cycle #11 Kadcyla (TDM-1; trastuzumab emtansine).   Lequita Asal, MD  03/30/2015

## 2015-04-20 NOTE — Progress Notes (Signed)
Pt would like to know what she can take for seasonal allergies.  No other concerns

## 2015-04-20 NOTE — Progress Notes (Signed)
[facility] day:  04/20/2015  Chief Complaint: Elizabeth Bond is a 65 y.o. female with metastatic Her2/neu positive right breast cancer who is seen for assessment prior to cycle #11 Kadcyla.  HPI:  The patient was last seen in the medical oncology clinic on 03/30/2015.  At that time, she received cycle #10 Kadcyla.  She also received Xgeva.  She receives [place] every 6 weeks (every other treatment day).  She tolerated her infusion well.  Oxycontin was decreased to 10 mg BID.  During the interim, she has done well.  She has mild fatigue.  She notes ongoing sinus symptoms.  She denies any pain on the reduced dose of Oxycontin.   Past Medical History  Diagnosis Date  . IBS (irritable bowel syndrome)   . Cancer ([place]) 2015    breast and bone  . Breast cancer ([place])     right  . Metastasis to bone ([place]) 09/01/2014  . COPD (chronic obstructive pulmonary disease) [facility])     Past Surgical History  Procedure Laterality Date  . Replacement total knee Left 2004-2005?  Marland Kitchen Abdominal hysterectomy  1978  . Portacath placement  2015    Family History  Problem Relation Age of Onset  . Cancer Father     prostate  . Cancer Maternal Aunt     breast  . Cancer Cousin     breast  . Cancer Other     lung cancer - neice  . Cancer - Other Daughter     brain  . Cancer Mother     Pancreatic    Social History:  reports that she quit smoking about 2 years ago. Her smoking use included Cigarettes. She has a 10 pack-year smoking history. She has never used smokeless tobacco. She reports that she does not drink alcohol or use illicit drugs.  The patient is alone today.  Allergies:  Allergies  Allergen Reactions  . Fentanyl Other (See Comments)    "loopy and confused"    Current Medications: Current Outpatient Prescriptions  Medication Sig Dispense Refill  . acetaminophen (TYLENOL) 500 MG tablet Take 1,000 mg by mouth every 8 (eight) hours as needed for  headache.    . calcium carbonate (OS-CAL) 600 MG TABS tablet Take 1 tablet (600 mg total) by mouth 2 (two) times daily with a meal. 60 tablet 3  . clonazePAM (KLONOPIN) 0.5 MG tablet Take 1 tablet (0.5 mg total) by mouth 3 (three) times daily as needed for anxiety. 90 tablet 2  . furosemide (LASIX) 20 MG tablet TAKE ONE TABLET BY MOUTH EVERY OTHER DAYAS NEEDED FOR SWELLING 30 tablet 1  . ondansetron (ZOFRAN) 4 MG tablet Take 1 tablet (4 mg total) by mouth every 8 (eight) hours as needed for nausea or vomiting. 20 tablet 1  . oxycodone (OXY-IR) 5 MG capsule Take 1 tablet every 4 hours by mouth as needed for pain 20 capsule 0  . oxyCODONE (OXYCONTIN) 10 mg 12 hr tablet Take 1 tablet (10 mg total) by mouth every 12 (twelve) hours. 60 tablet 0  . pantoprazole (PROTONIX) 40 MG tablet Take 1 tablet (40 mg total) by mouth daily. 30 tablet 6  . phosphorus (PHOSPHA 250 NEUTRAL) 155-852-130 MG tablet Take 1 tablet (250 mg total) by mouth 2 (two) times daily. 60 tablet 2  . potassium chloride (K-DUR) 10 MEQ tablet Take 1 tablet (10 mEq total) by mouth daily. 30 tablet 2   No current facility-administered medications for  this visit.    Review of Systems:  GENERAL:  Feels good.  Mild fatigue.  No fevers or sweats.  Weight up 2 pounds. PERFORMANCE STATUS (ECOG):  0 HEENT:  Sinus allergies.  No visual changes, runny nose, sore throat, mouth sores or tenderness. Lungs: No shortness of breath or cough.  No hemoptysis. Cardiac:  No chest pain, palpitations, orthopnea, or PND. GI:  Appetite 75% normal.  No nausea, vomiting, diarrhea, constipation, melena or hematochezia. GU:  No urgency, frequency, dysuria, or hematuria. Musculoskeletal:  No back pain.  No joint pain.  No muscle tenderness. Extremities:  No pain or swelling. Skin:  No rashes or skin changes. Neuro:  No headache, numbness or weakness, balance or coordination issues. Endocrine:  No diabetes, thyroid issues, hot flashes or night sweats. Psych:   No mood changes, depression or anxiety. Pain:  No focal pain. Review of systems:  All other systems reviewed and found to be negative.   Physical Exam: Blood pressure 143/83, pulse 78, temperature 94.3 F (34.6 C), temperature source Tympanic, resp. rate 18, height 5' 4"  (1.626 m), weight 115 lb 10.1 oz (52.45 kg). GENERAL:  Well developed, well nourished, sitting comfortably in the exam room in no acute distress. MENTAL STATUS:  Alert and oriented to person, place and time. HEAD:  Short styled gray hair.  Normocephalic, atraumatic, face symmetric, no Cushingoid features. EYES: Hazel eyes.  Pupils equal round and reactive to light and accomodation.  No conjunctivitis or scleral icterus. ENT:  Oropharynx clear without lesion.  Tongue normal. Mucous membranes moist.  RESPIRATORY:  Clear to auscultation without rales, wheezes or rhonchi. CARDIOVASCULAR:  Regular rate and rhythm without murmur, rub or gallop.  No JVD. ABDOMEN:  Soft, non-tender, with active bowel sounds, and no hepatosplenomegaly.  No masses. SKIN:  No rashes, ulcers or lesions. EXTREMITIES: No edema, no skin discoloration or tenderness.  No palpable cords. LYMPH NODES: No palpable cervical, supraclavicular, axillary or inguinal adenopathy  NEUROLOGICAL: Unremarkable.  Limp secondary to knee. PSYCH:  Appropriate.  NEUROLOGICAL: Unremarkable.  Appointment on 04/20/2015  Component Date Value Ref Range Status  . WBC 04/20/2015 5.0  3.6 - 11.0 K/uL Final  . RBC 04/20/2015 4.06  3.80 - 5.20 MIL/uL Final  . Hemoglobin 04/20/2015 12.5  12.0 - 16.0 g/dL Final  . HCT 04/20/2015 36.6  35.0 - 47.0 % Final  . MCV 04/20/2015 90.2  80.0 - 100.0 fL Final  . MCH 04/20/2015 30.9  26.0 - 34.0 pg Final  . MCHC 04/20/2015 34.3  32.0 - 36.0 g/dL Final  . RDW 04/20/2015 15.5* 11.5 - 14.5 % Final  . Platelets 04/20/2015 154  150 - 440 K/uL Final  . Neutrophils Relative % 04/20/2015 71   Final  . Neutro Abs 04/20/2015 3.6  1.4 - 6.5 K/uL Final   . Lymphocytes Relative 04/20/2015 18   Final  . Lymphs Abs 04/20/2015 0.9* 1.0 - 3.6 K/uL Final  . Monocytes Relative 04/20/2015 8   Final  . Monocytes Absolute 04/20/2015 0.4  0.2 - 0.9 K/uL Final  . Eosinophils Relative 04/20/2015 2   Final  . Eosinophils Absolute 04/20/2015 0.1  0 - 0.7 K/uL Final  . Basophils Relative 04/20/2015 1   Final  . Basophils Absolute 04/20/2015 0.0  0 - 0.1 K/uL Final  . Sodium 04/20/2015 136  135 - 145 mmol/L Final  . Potassium 04/20/2015 3.6  3.5 - 5.1 mmol/L Final  . Chloride 04/20/2015 104  101 - 111 mmol/L Final  . CO2  04/20/2015 28  22 - 32 mmol/L Final  . Glucose, Bld 04/20/2015 96  65 - 99 mg/dL Final  . BUN 04/20/2015 27* 6 - 20 mg/dL Final  . Creatinine, Ser 04/20/2015 0.87  0.44 - 1.00 mg/dL Final  . Calcium 04/20/2015 9.2  8.9 - 10.3 mg/dL Final  . Total Protein 04/20/2015 8.4* 6.5 - 8.1 g/dL Final  . Albumin 04/20/2015 3.8  3.5 - 5.0 g/dL Final  . AST 04/20/2015 46* 15 - 41 U/L Final  . ALT 04/20/2015 23  14 - 54 U/L Final  . Alkaline Phosphatase 04/20/2015 85  38 - 126 U/L Final  . Total Bilirubin 04/20/2015 0.5  0.3 - 1.2 mg/dL Final  . GFR calc non Af Amer 04/20/2015 >60  >60 mL/min Final  . GFR calc Af Amer 04/20/2015 >60  >60 mL/min Final   Comment: (NOTE) The eGFR has been calculated using the CKD EPI equation. This calculation has not been validated in all clinical situations. eGFR's persistently <60 mL/min signify possible Chronic Kidney Disease.   . Anion gap 04/20/2015 4* 5 - 15 Final  . Magnesium 04/20/2015 1.8  1.7 - 2.4 mg/dL Final    Assessment:  LEXY MEININGER is a 65 y.o. female with metastatic Her2/neu positive right breast cancer.  She initially presented in 03/2013 with an ulcerated breast mass and back pain.  Breast biopsy revealed lobular breast cancer.  Tumor was ER/PR positive and HER-2/neu positive. PET scan revealed bone metastasis. She received palliative radiation to T8 and T10 from 04/02-04/15/2015.  She  declined chemotherapy. She initially began letrozole and Tykerb. Tykerb was discontinued after 1 dose. She received letrozole from 05/02/2013 until 01/07/2014 .  She received Herceptin from 06/02/2013 until 12/15/2013. She received fulvestrant monthly from 01/07/2014 until 05/27/2014.    She has received Xgeva monthly (03/31/2013 until 08/25/2014; last 03/30/2015).  She was switched to every 6 weeks Xgeva to coordinate with her treatment.  Herceptin was discontinued for a rising CA-27-29.  CA27.29 was 916.3 on 03/19/2013, 286.2 on 06/23/2013, 70.8 on 08/25/2013, 56.3 on 10/08/2013, 102.7 on 12/15/2013, 149.4 on 01/07/2014, 178.0 on 02/04/2014, 190.7 on 03/30/2014, 143.2 on 04/22/2014, 134.8 on 05/20/2014, 248.3 on 07/09/2014, 723.2 on 08/25/2014, 252.3 on 10/08/2014, 126 on 10/29/2014, 71.3 on 11/24/2014, 55 on 12/15/2014, 70.6 on 01/05/2015, 48.6 on 01/26/2015, 52.7 on 02/15/2015, 54.7 on 03/30/2015, and 54 on 04/20/2015.   She received Ibrance and Faslodex from 02/23/2014 until 07/27/2014.  Echo on 09/04/2014 revealed an EF of 55-65%.  Echo on 12/14/2014 revealed an EF of 55-60%.  Echo on 03/15/2015 revealed an EF of 55-60%.  PET scan on 08/28/2014 revealed significant interval worsening and multifocal osseous metastatic disease. Lesions had increased in number and metabolic activity. There were no extra osseous metastasis.  She is s/p 10 cycles of Kadcyla (09/10/2014-03/30/2015).  She is tolerating treatment well.  She denies any bone pain.   PET scan on 02/08/2015 revealed a complete metabolic response in the sclerotic osseous metastases, with no residual hypermetabolic metastatic disease.  She was admitted from 12/22/2014 - 12/24/2014 with E coli sepsis secondary to a UTI.  She was treated with Augmentin.  Serum protein is elevated.  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  Symptomatically, she has seasonal allergies.  Exam is unremarkable.  Plan: 1.  Labs today:  CBC with diff,  CMP, Mg, CA27.29. 2.  Cycle #11 Kadcyla. 3.  Discuss recent CA27.29 levels.  Continue to monitor. 4.  Anticipate next echo on 06/15/2015. 5.  Taper  Oxycontin to 10 mg po q hs. 6.  Discuss use of Claritin for sinus symptoms. 7.  RTC in 3 weeks for MD assessment, labs (CBC with diff, CMP, Mg, CA27.29), Xegva and cycle #12 Kadcyla (TDM-1; trastuzumab emtansine).   Elizabeth Asal, MD  04/20/2015, 10:14 AM

## 2015-04-20 NOTE — Telephone Encounter (Signed)
Called pt per MD to encourage pt to start taking magnesium once daily.  Pt stated she would look into it and ask the pharmacist if she needs help.  Pt asked foods that contained magnesium and we went over some.   Pt then again stated she would try looking into a pill.  I told pt she could speak with pharmacist to show her the OTC magnesium or can call us back with any questions. No other concerns noted.

## 2015-04-21 LAB — CANCER ANTIGEN 27.29: CA 27.29: 54 U/mL — ABNORMAL HIGH (ref 0.0–38.6)

## 2015-05-11 ENCOUNTER — Inpatient Hospital Stay: Payer: Medicare Other

## 2015-05-11 ENCOUNTER — Inpatient Hospital Stay: Payer: Medicare Other | Attending: Hematology and Oncology

## 2015-05-11 ENCOUNTER — Inpatient Hospital Stay (HOSPITAL_BASED_OUTPATIENT_CLINIC_OR_DEPARTMENT_OTHER): Payer: Medicare Other | Admitting: Hematology and Oncology

## 2015-05-11 ENCOUNTER — Encounter: Payer: Self-pay | Admitting: Hematology and Oncology

## 2015-05-11 ENCOUNTER — Other Ambulatory Visit: Payer: Self-pay | Admitting: Hematology and Oncology

## 2015-05-11 VITALS — BP 152/85 | HR 77 | Temp 95.2°F | Resp 18 | Ht 64.0 in | Wt 116.6 lb

## 2015-05-11 DIAGNOSIS — Z5112 Encounter for antineoplastic immunotherapy: Secondary | ICD-10-CM | POA: Diagnosis not present

## 2015-05-11 DIAGNOSIS — C7951 Secondary malignant neoplasm of bone: Secondary | ICD-10-CM | POA: Insufficient documentation

## 2015-05-11 DIAGNOSIS — D890 Polyclonal hypergammaglobulinemia: Secondary | ICD-10-CM | POA: Diagnosis not present

## 2015-05-11 DIAGNOSIS — G2581 Restless legs syndrome: Secondary | ICD-10-CM | POA: Insufficient documentation

## 2015-05-11 DIAGNOSIS — Z79899 Other long term (current) drug therapy: Secondary | ICD-10-CM | POA: Diagnosis not present

## 2015-05-11 DIAGNOSIS — C50911 Malignant neoplasm of unspecified site of right female breast: Secondary | ICD-10-CM | POA: Insufficient documentation

## 2015-05-11 DIAGNOSIS — J449 Chronic obstructive pulmonary disease, unspecified: Secondary | ICD-10-CM | POA: Diagnosis not present

## 2015-05-11 DIAGNOSIS — Z170 Estrogen receptor positive status [ER+]: Secondary | ICD-10-CM | POA: Insufficient documentation

## 2015-05-11 DIAGNOSIS — K589 Irritable bowel syndrome without diarrhea: Secondary | ICD-10-CM | POA: Diagnosis not present

## 2015-05-11 DIAGNOSIS — Z923 Personal history of irradiation: Secondary | ICD-10-CM | POA: Diagnosis not present

## 2015-05-11 DIAGNOSIS — Z87891 Personal history of nicotine dependence: Secondary | ICD-10-CM | POA: Insufficient documentation

## 2015-05-11 DIAGNOSIS — Z5111 Encounter for antineoplastic chemotherapy: Secondary | ICD-10-CM

## 2015-05-11 DIAGNOSIS — C50919 Malignant neoplasm of unspecified site of unspecified female breast: Secondary | ICD-10-CM

## 2015-05-11 LAB — COMPREHENSIVE METABOLIC PANEL
ALT: 21 U/L (ref 14–54)
AST: 42 U/L — ABNORMAL HIGH (ref 15–41)
Albumin: 3.6 g/dL (ref 3.5–5.0)
Alkaline Phosphatase: 77 U/L (ref 38–126)
Anion gap: 7 (ref 5–15)
BUN: 25 mg/dL — ABNORMAL HIGH (ref 6–20)
CO2: 28 mmol/L (ref 22–32)
Calcium: 9.1 mg/dL (ref 8.9–10.3)
Chloride: 101 mmol/L (ref 101–111)
Creatinine, Ser: 0.79 mg/dL (ref 0.44–1.00)
GFR calc Af Amer: >60 mL/min (ref >60)
GFR calc non Af Amer: >60 mL/min (ref >60)
Glucose, Bld: 98 mg/dL (ref 65–99)
Potassium: 3.6 mmol/L (ref 3.5–5.1)
Sodium: 136 mmol/L (ref 135–145)
Total Bilirubin: 0.6 mg/dL (ref 0.3–1.2)
Total Protein: 8.4 g/dL — ABNORMAL HIGH (ref 6.5–8.1)

## 2015-05-11 LAB — CBC WITH DIFFERENTIAL/PLATELET
Basophils Absolute: 0 10*3/uL (ref 0–0.1)
Basophils Relative: 1 %
Eosinophils Absolute: 0.1 10*3/uL (ref 0–0.7)
Eosinophils Relative: 1 %
HCT: 36.7 % (ref 35.0–47.0)
Hemoglobin: 12.7 g/dL (ref 12.0–16.0)
Lymphocytes Relative: 14 %
Lymphs Abs: 0.8 10*3/uL — ABNORMAL LOW (ref 1.0–3.6)
MCH: 31.3 pg (ref 26.0–34.0)
MCHC: 34.6 g/dL (ref 32.0–36.0)
MCV: 90.5 fL (ref 80.0–100.0)
Monocytes Absolute: 0.4 10*3/uL (ref 0.2–0.9)
Monocytes Relative: 7 %
Neutro Abs: 4 10*3/uL (ref 1.4–6.5)
Neutrophils Relative %: 77 %
Platelets: 159 10*3/uL (ref 150–440)
RBC: 4.06 MIL/uL (ref 3.80–5.20)
RDW: 15.3 % — ABNORMAL HIGH (ref 11.5–14.5)
WBC: 5.3 10*3/uL (ref 3.6–11.0)

## 2015-05-11 LAB — MAGNESIUM: Magnesium: 1.8 mg/dL (ref 1.7–2.4)

## 2015-05-11 MED ORDER — SODIUM CHLORIDE 0.9 % IV SOLN
Freq: Once | INTRAVENOUS | Status: AC
  Administered 2015-05-11: 10:00:00 via INTRAVENOUS
  Filled 2015-05-11: qty 1000

## 2015-05-11 MED ORDER — DIPHENHYDRAMINE HCL 25 MG PO CAPS
50.0000 mg | ORAL_CAPSULE | Freq: Once | ORAL | Status: AC
  Administered 2015-05-11: 50 mg via ORAL
  Filled 2015-05-11: qty 2

## 2015-05-11 MED ORDER — OXYCODONE HCL 5 MG PO CAPS: ORAL_CAPSULE | ORAL | Status: DC

## 2015-05-11 MED ORDER — ACETAMINOPHEN 325 MG PO TABS
650.0000 mg | ORAL_TABLET | Freq: Once | ORAL | Status: AC
  Administered 2015-05-11: 650 mg via ORAL
  Filled 2015-05-11: qty 2

## 2015-05-11 MED ORDER — HEPARIN SOD (PORK) LOCK FLUSH 100 UNIT/ML IV SOLN
500.0000 [IU] | Freq: Once | INTRAVENOUS | Status: AC
  Administered 2015-05-11: 500 [IU] via INTRAVENOUS
  Filled 2015-05-11: qty 5

## 2015-05-11 MED ORDER — SODIUM CHLORIDE 0.9% FLUSH
10.0000 mL | INTRAVENOUS | Status: DC | PRN
  Administered 2015-05-11: 10 mL via INTRAVENOUS
  Filled 2015-05-11: qty 10

## 2015-05-11 MED ORDER — DENOSUMAB 120 MG/1.7ML ~~LOC~~ SOLN
120.0000 mg | Freq: Once | SUBCUTANEOUS | Status: AC
  Administered 2015-05-11: 120 mg via SUBCUTANEOUS
  Filled 2015-05-11: qty 1.7

## 2015-05-11 MED ORDER — SODIUM CHLORIDE 0.9 % IV SOLN
3.6000 mg/kg | Freq: Once | INTRAVENOUS | Status: AC
  Administered 2015-05-11: 200 mg via INTRAVENOUS
  Filled 2015-05-11: qty 10

## 2015-05-11 NOTE — Progress Notes (Signed)
Pt reports she is having to take pain medication at nighttime to aid her to sleep.  She sleeps good when the medication kicks in.  Taking the magnesium 250mg  once daily.  Still taking claritin for sinus issues

## 2015-05-11 NOTE — Progress Notes (Signed)
[facility] day:  05/11/2015  Chief Complaint: Elizabeth Bond is a 65 y.o. female with metastatic Her2/neu positive right breast cancer who is seen for assessment prior to cycle #12 Kadcyla.  HPI:  The patient was last seen in the medical oncology clinic on 04/20/2015.  At that time, she received cycle #11 Kadcyla.  She denied any complaint except for seasonal allergies.  CBC with diff and CMP was normal.  CA27.29 was 54.0 (stable).  Oxycontin was decreased to 10 mg a day.  During the interim, she has done well.  She is taking Claritin for her sinus symptoms.  She has restless legs.  She is taking oral magnesium. She is taking 2 oxycodone (5 mg tablets) a day.   Past Medical History  Diagnosis Date  . IBS (irritable bowel syndrome)   . Cancer ([place]) 2015    breast and bone  . Breast cancer ([place])     right  . Metastasis to bone ([place]) 09/01/2014  . COPD (chronic obstructive pulmonary disease) [facility])     Past Surgical History  Procedure Laterality Date  . Replacement total knee Left 2004-2005?  Marland Kitchen Abdominal hysterectomy  1978  . Portacath placement  2015    Family History  Problem Relation Age of Onset  . Cancer Father     prostate  . Cancer Maternal Aunt     breast  . Cancer Cousin     breast  . Cancer Other     lung cancer - neice  . Cancer - Other Daughter     brain  . Cancer Mother     Pancreatic    Social History:  reports that she quit smoking about 2 years ago. Her smoking use included Cigarettes. She has a 10 pack-year smoking history. She has never used smokeless tobacco. She reports that she does not drink alcohol or use illicit drugs.  The patient is alone today.  Allergies:  Allergies  Allergen Reactions  . Fentanyl Other (See Comments)    "loopy and confused"    Current Medications: Current Outpatient Prescriptions  Medication Sig Dispense Refill  . acetaminophen (TYLENOL) 500 MG tablet Take 1,000 mg by mouth  every 8 (eight) hours as needed for headache.    . calcium carbonate (OS-CAL) 600 MG TABS tablet Take 1 tablet (600 mg total) by mouth 2 (two) times daily with a meal. 60 tablet 3  . clonazePAM (KLONOPIN) 0.5 MG tablet Take 1 tablet (0.5 mg total) by mouth 3 (three) times daily as needed for anxiety. 90 tablet 2  . furosemide (LASIX) 20 MG tablet TAKE ONE TABLET BY MOUTH EVERY OTHER DAYAS NEEDED FOR SWELLING 30 tablet 1  . Magnesium 250 MG TABS Take by mouth.    . ondansetron (ZOFRAN) 4 MG tablet Take 1 tablet (4 mg total) by mouth every 8 (eight) hours as needed for nausea or vomiting. 20 tablet 1  . oxycodone (OXY-IR) 5 MG capsule Take 1 tablet every 4 hours by mouth as needed for pain 20 capsule 0  . oxyCODONE (OXYCONTIN) 10 mg 12 hr tablet Take 1 tablet (10 mg total) by mouth every 12 (twelve) hours. 60 tablet 0  . pantoprazole (PROTONIX) 40 MG tablet Take 1 tablet (40 mg total) by mouth daily. 30 tablet 6  . phosphorus (PHOSPHA 250 NEUTRAL) 155-852-130 MG tablet Take 1 tablet (250 mg total) by mouth 2 (two) times daily. 60 tablet 2  . potassium chloride (K-DUR) 10 MEQ  tablet Take 1 tablet (10 mEq total) by mouth daily. 30 tablet 2   No current facility-administered medications for this visit.   Facility-Administered Medications Ordered in Other Visits  Medication Dose Route Frequency Provider Last Rate Last Dose  . heparin lock flush 100 unit/mL  500 Units Intravenous Once Lequita Asal, MD      . sodium chloride flush (NS) 0.9 % injection 10 mL  10 mL Intravenous PRN Lequita Asal, MD   10 mL at 05/11/15 0847    Review of Systems:  GENERAL:  Feels good.  No fevers or sweats.  Weight up 1 pound. PERFORMANCE STATUS (ECOG):  0 HEENT:  Sinus symptoms improved on Claritin.  No visual changes, runny nose, sore throat, mouth sores or tenderness. Lungs: No shortness of breath or cough.  No hemoptysis. Cardiac:  No chest pain, palpitations, orthopnea, or PND. GI:  Appetite normal.   No nausea, vomiting, diarrhea, constipation, melena or hematochezia. GU:  No urgency, frequency, dysuria, or hematuria. Musculoskeletal:  No back pain.  No joint pain.  No muscle tenderness. Extremities:  No pain or swelling. Skin:  No rashes or skin changes. Neuro:  Restless legs at night.  No headache, numbness or weakness, balance or coordination issues. Endocrine:  No diabetes, thyroid issues, hot flashes or night sweats. Psych:  No mood changes, depression or anxiety. Pain:  No focal pain. Review of systems:  All other systems reviewed and found to be negative.   Physical Exam: Blood pressure 152/85, pulse 77, temperature 95.2 F (35.1 C), resp. rate 18, height 5' 4"  (1.626 m), weight 116 lb 10 oz (52.9 kg). GENERAL:  Well developed, well nourished, sitting comfortably in the exam room in no acute distress. MENTAL STATUS:  Alert and oriented to person, place and time. HEAD:  Short styled gray hair.  Normocephalic, atraumatic, face symmetric, no Cushingoid features. EYES: Hazel eyes.  Pupils equal round and reactive to light and accomodation.  No conjunctivitis or scleral icterus. ENT:  Oropharynx clear without lesion.  Tongue normal. Mucous membranes moist.  RESPIRATORY:  Clear to auscultation without rales, wheezes or rhonchi. CARDIOVASCULAR:  Regular rate and rhythm without murmur, rub or gallop.  No JVD. ABDOMEN:  Soft, non-tender, with active bowel sounds, and no hepatosplenomegaly.  No masses. SKIN:  No rashes, ulcers or lesions. EXTREMITIES: No edema, no skin discoloration or tenderness.  No palpable cords. LYMPH NODES: No palpable cervical, supraclavicular, axillary or inguinal adenopathy  NEUROLOGICAL: Unremarkable. PSYCH:  Appropriate.  NEUROLOGICAL: Unremarkable.  Infusion on 05/11/2015  Component Date Value Ref Range Status  . WBC 05/11/2015 5.3  3.6 - 11.0 K/uL Final  . RBC 05/11/2015 4.06  3.80 - 5.20 MIL/uL Final  . Hemoglobin 05/11/2015 12.7  12.0 - 16.0 g/dL  Final  . HCT 05/11/2015 36.7  35.0 - 47.0 % Final  . MCV 05/11/2015 90.5  80.0 - 100.0 fL Final  . MCH 05/11/2015 31.3  26.0 - 34.0 pg Final  . MCHC 05/11/2015 34.6  32.0 - 36.0 g/dL Final  . RDW 05/11/2015 15.3* 11.5 - 14.5 % Final  . Platelets 05/11/2015 159  150 - 440 K/uL Final  . Neutrophils Relative % 05/11/2015 77   Final  . Neutro Abs 05/11/2015 4.0  1.4 - 6.5 K/uL Final  . Lymphocytes Relative 05/11/2015 14   Final  . Lymphs Abs 05/11/2015 0.8* 1.0 - 3.6 K/uL Final  . Monocytes Relative 05/11/2015 7   Final  . Monocytes Absolute 05/11/2015 0.4  0.2 -  0.9 K/uL Final  . Eosinophils Relative 05/11/2015 1   Final  . Eosinophils Absolute 05/11/2015 0.1  0 - 0.7 K/uL Final  . Basophils Relative 05/11/2015 1   Final  . Basophils Absolute 05/11/2015 0.0  0 - 0.1 K/uL Final  . Sodium 05/11/2015 136  135 - 145 mmol/L Final  . Potassium 05/11/2015 3.6  3.5 - 5.1 mmol/L Final  . Chloride 05/11/2015 101  101 - 111 mmol/L Final  . CO2 05/11/2015 28  22 - 32 mmol/L Final  . Glucose, Bld 05/11/2015 98  65 - 99 mg/dL Final  . BUN 05/11/2015 25* 6 - 20 mg/dL Final  . Creatinine, Ser 05/11/2015 0.79  0.44 - 1.00 mg/dL Final  . Calcium 05/11/2015 9.1  8.9 - 10.3 mg/dL Final  . Total Protein 05/11/2015 8.4* 6.5 - 8.1 g/dL Final  . Albumin 05/11/2015 3.6  3.5 - 5.0 g/dL Final  . AST 05/11/2015 42* 15 - 41 U/L Final  . ALT 05/11/2015 21  14 - 54 U/L Final  . Alkaline Phosphatase 05/11/2015 77  38 - 126 U/L Final  . Total Bilirubin 05/11/2015 0.6  0.3 - 1.2 mg/dL Final  . GFR calc non Af Amer 05/11/2015 >60  >60 mL/min Final  . GFR calc Af Amer 05/11/2015 >60  >60 mL/min Final   Comment: (NOTE) The eGFR has been calculated using the CKD EPI equation. This calculation has not been validated in all clinical situations. eGFR's persistently <60 mL/min signify possible Chronic Kidney Disease.   . Anion gap 05/11/2015 7  5 - 15 Final  . Magnesium 05/11/2015 1.8  1.7 - 2.4 mg/dL Final     Assessment:  Elizabeth Bond is a 65 y.o. female with metastatic Her2/neu positive right breast cancer.  She initially presented in 03/2013 with an ulcerated breast mass and back pain.  Breast biopsy revealed lobular breast cancer.  Tumor was ER/PR positive and HER-2/neu positive. PET scan revealed bone metastasis. She received palliative radiation to T8 and T10 from 04/02-04/15/2015.  She declined chemotherapy. She initially began letrozole and Tykerb. Tykerb was discontinued after 1 dose. She received letrozole from 05/02/2013 until 01/07/2014 .  She received Herceptin from 06/02/2013 until 12/15/2013. She received fulvestrant monthly from 01/07/2014 until 05/27/2014.    She has received Xgeva monthly (03/31/2013 until 08/25/2014; last 03/30/2015).  She was switched to every 6 weeks Xgeva to coordinate with her treatment.  Herceptin was discontinued for a rising CA-27-29.  CA27.29 was 916.3 on 03/19/2013, 286.2 on 06/23/2013, 70.8 on 08/25/2013, 56.3 on 10/08/2013, 102.7 on 12/15/2013, 149.4 on 01/07/2014, 178.0 on 02/04/2014, 190.7 on 03/30/2014, 143.2 on 04/22/2014, 134.8 on 05/20/2014, 248.3 on 07/09/2014, 723.2 on 08/25/2014, 252.3 on 10/08/2014, 126 on 10/29/2014, 71.3 on 11/24/2014, 55 on 12/15/2014, 70.6 on 01/05/2015, 48.6 on 01/26/2015, 52.7 on 02/15/2015, 54.7 on 03/30/2015, and 54 on 04/20/2015.   She received Ibrance and Faslodex from 02/23/2014 until 07/27/2014.  Echo on 09/04/2014 revealed an EF of 55-65%.  Echo on 12/14/2014 revealed an EF of 55-60%.  Echo on 03/15/2015 revealed an EF of 55-60%.  PET scan on 08/28/2014 revealed significant interval worsening and multifocal osseous metastatic disease. Lesions had increased in number and metabolic activity. There were no extra osseous metastasis.  She is s/p 11 cycles of Kadcyla (09/10/2014-04/20/2015).  She is tolerating treatment well.  She denies any bone pain.   PET scan on 02/08/2015 revealed a complete metabolic response in the  sclerotic osseous metastases, with no residual hypermetabolic metastatic disease.  She was admitted from 12/22/2014 - 12/24/2014 with E coli sepsis secondary to a UTI.  She was treated with Augmentin.  Serum protein is elevated.  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  Symptomatically, she has tapered off her Oxycontin.  Exam is unremarkable.  Magnesium is normal.  Plan: 1.  Labs today:  CBC with diff, CMP, Mg, CA27.29. 2.  Cycle #12 Kadcyla. 3.  Xgeva today  4.  Schedule echo 06/15/2015. 5.  Continue magnesium 250 mg po q day. 6.  Rx: oxycodone 5 mg po q 6 hours prn pain; dis #60. 7.  RTC in 3 weeks for MD assessment, labs (CBC with diff, CMP, Mg, CA27.29), and cycle #13 Kadcyla (TDM-1; trastuzumab emtansine).   Lequita Asal, MD  05/11/2015, 9:42 AM

## 2015-05-13 ENCOUNTER — Encounter: Payer: Self-pay | Admitting: Hematology and Oncology

## 2015-05-13 DIAGNOSIS — Z170 Estrogen receptor positive status [ER+]: Secondary | ICD-10-CM

## 2015-05-13 DIAGNOSIS — C50911 Malignant neoplasm of unspecified site of right female breast: Secondary | ICD-10-CM | POA: Insufficient documentation

## 2015-05-30 ENCOUNTER — Other Ambulatory Visit: Payer: Self-pay | Admitting: Hematology and Oncology

## 2015-06-01 ENCOUNTER — Ambulatory Visit: Payer: Medicare Other | Admitting: Family Medicine

## 2015-06-01 ENCOUNTER — Inpatient Hospital Stay: Payer: Medicare Other

## 2015-06-01 ENCOUNTER — Other Ambulatory Visit: Payer: Medicare Other

## 2015-06-01 ENCOUNTER — Inpatient Hospital Stay (HOSPITAL_BASED_OUTPATIENT_CLINIC_OR_DEPARTMENT_OTHER): Payer: Medicare Other | Admitting: Oncology

## 2015-06-01 ENCOUNTER — Telehealth: Payer: Self-pay | Admitting: *Deleted

## 2015-06-01 ENCOUNTER — Ambulatory Visit: Payer: Medicare Other

## 2015-06-01 DIAGNOSIS — C50911 Malignant neoplasm of unspecified site of right female breast: Secondary | ICD-10-CM

## 2015-06-01 DIAGNOSIS — Z923 Personal history of irradiation: Secondary | ICD-10-CM

## 2015-06-01 DIAGNOSIS — D890 Polyclonal hypergammaglobulinemia: Secondary | ICD-10-CM

## 2015-06-01 DIAGNOSIS — Z5112 Encounter for antineoplastic immunotherapy: Secondary | ICD-10-CM | POA: Diagnosis not present

## 2015-06-01 DIAGNOSIS — C7951 Secondary malignant neoplasm of bone: Secondary | ICD-10-CM

## 2015-06-01 DIAGNOSIS — C50919 Malignant neoplasm of unspecified site of unspecified female breast: Secondary | ICD-10-CM

## 2015-06-01 DIAGNOSIS — Z170 Estrogen receptor positive status [ER+]: Secondary | ICD-10-CM

## 2015-06-01 DIAGNOSIS — Z79899 Other long term (current) drug therapy: Secondary | ICD-10-CM

## 2015-06-01 DIAGNOSIS — Z5111 Encounter for antineoplastic chemotherapy: Secondary | ICD-10-CM

## 2015-06-01 LAB — CBC WITH DIFFERENTIAL/PLATELET
Basophils Absolute: 0 10*3/uL (ref 0–0.1)
Basophils Relative: 1 %
Eosinophils Absolute: 0.1 10*3/uL (ref 0–0.7)
Eosinophils Relative: 1 %
HCT: 38.6 % (ref 35.0–47.0)
Hemoglobin: 13.4 g/dL (ref 12.0–16.0)
Lymphocytes Relative: 19 %
Lymphs Abs: 1.1 10*3/uL (ref 1.0–3.6)
MCH: 31.3 pg (ref 26.0–34.0)
MCHC: 34.7 g/dL (ref 32.0–36.0)
MCV: 90 fL (ref 80.0–100.0)
Monocytes Absolute: 0.4 10*3/uL (ref 0.2–0.9)
Monocytes Relative: 7 %
Neutro Abs: 4.1 10*3/uL (ref 1.4–6.5)
Neutrophils Relative %: 72 %
Platelets: 181 10*3/uL (ref 150–440)
RBC: 4.29 MIL/uL (ref 3.80–5.20)
RDW: 15.4 % — ABNORMAL HIGH (ref 11.5–14.5)
WBC: 5.7 10*3/uL (ref 3.6–11.0)

## 2015-06-01 LAB — COMPREHENSIVE METABOLIC PANEL
ALT: 24 U/L (ref 14–54)
AST: 46 U/L — ABNORMAL HIGH (ref 15–41)
Albumin: 3.7 g/dL (ref 3.5–5.0)
Alkaline Phosphatase: 97 U/L (ref 38–126)
Anion gap: 8 (ref 5–15)
BUN: 19 mg/dL (ref 6–20)
CO2: 26 mmol/L (ref 22–32)
Calcium: 8.9 mg/dL (ref 8.9–10.3)
Chloride: 102 mmol/L (ref 101–111)
Creatinine, Ser: 0.79 mg/dL (ref 0.44–1.00)
GFR calc Af Amer: >60 mL/min (ref >60)
GFR calc non Af Amer: >60 mL/min (ref >60)
Glucose, Bld: 85 mg/dL (ref 65–99)
Potassium: 3.4 mmol/L — ABNORMAL LOW (ref 3.5–5.1)
Sodium: 136 mmol/L (ref 135–145)
Total Bilirubin: 0.9 mg/dL (ref 0.3–1.2)
Total Protein: 9 g/dL — ABNORMAL HIGH (ref 6.5–8.1)

## 2015-06-01 LAB — MAGNESIUM: Magnesium: 1.9 mg/dL (ref 1.7–2.4)

## 2015-06-01 MED ORDER — OXYCODONE HCL 5 MG PO CAPS: ORAL_CAPSULE | ORAL | Status: DC

## 2015-06-01 MED ORDER — ACETAMINOPHEN 325 MG PO TABS
650.0000 mg | ORAL_TABLET | Freq: Once | ORAL | Status: AC
  Administered 2015-06-01: 650 mg via ORAL
  Filled 2015-06-01: qty 2

## 2015-06-01 MED ORDER — SODIUM CHLORIDE 0.9 % IV SOLN
3.6000 mg/kg | Freq: Once | INTRAVENOUS | Status: AC
  Administered 2015-06-01: 200 mg via INTRAVENOUS
  Filled 2015-06-01: qty 10

## 2015-06-01 MED ORDER — DIPHENHYDRAMINE HCL 25 MG PO CAPS
50.0000 mg | ORAL_CAPSULE | Freq: Once | ORAL | Status: AC
  Administered 2015-06-01: 50 mg via ORAL
  Filled 2015-06-01: qty 2

## 2015-06-01 MED ORDER — SODIUM CHLORIDE 0.9% FLUSH
10.0000 mL | INTRAVENOUS | Status: DC | PRN
  Filled 2015-06-01: qty 10

## 2015-06-01 MED ORDER — HEPARIN SOD (PORK) LOCK FLUSH 100 UNIT/ML IV SOLN
INTRAVENOUS | Status: AC
  Filled 2015-06-01: qty 5

## 2015-06-01 MED ORDER — HEPARIN SOD (PORK) LOCK FLUSH 100 UNIT/ML IV SOLN
500.0000 [IU] | Freq: Once | INTRAVENOUS | Status: AC
  Administered 2015-06-01: 500 [IU] via INTRAVENOUS

## 2015-06-01 MED ORDER — SODIUM CHLORIDE 0.9 % IV SOLN
Freq: Once | INTRAVENOUS | Status: AC
  Administered 2015-06-01: 11:00:00 via INTRAVENOUS
  Filled 2015-06-01: qty 1000

## 2015-06-01 NOTE — Telephone Encounter (Signed)
Called pt and she is taking her potassium every day.  Dr. Grayland Ormond said it was only off for 1/10 of a point and if she is taking her potassium just eat  A banana today and maybe eat one 3 times a week to keep potassium level up and she was agreeable to Dr. Grayland Ormond request.

## 2015-06-01 NOTE — Progress Notes (Signed)
Dr. Mike Gip changed her pain medication from Oxycontin to Oxycodone 5mg  and since the change she is having difficulty sleeping.  Not taking the pain medication during the day but is needing to take at night for sleep.  Patient will discuss this issue at her next appt with Dr. Mike Gip.

## 2015-06-02 LAB — CANCER ANTIGEN 27.29: CA 27.29: 55.2 U/mL — ABNORMAL HIGH (ref 0.0–38.6)

## 2015-06-02 NOTE — Progress Notes (Signed)
[facility] day:  06/02/2015  Chief Complaint: Elizabeth Bond is a 65 y.o. female with metastatic Her2/neu positive right breast cancer who is seen for assessment prior to cycle #12 Kadcyla.  HPI:  Patient returns to clinic today for further evaluation and consideration of cycle 13 of Kadcyla.  She continues to have mild pain, but she only takes her narcotic at night to help her sleep. She continues to have seasonal allergies, but otherwise feels well. She denies any recent fevers. She has no chest pain, shortness of breath, cough, or hemoptysis. She denies any nausea, vomiting, constipation, or diarrhea. She has no urinary complaints. Patient otherwise feels well and offers no further specific complaints.   Past Medical History  Diagnosis Date  . IBS (irritable bowel syndrome)   . Cancer ([place]) 2015    breast and bone  . Breast cancer ([place])     right  . Metastasis to bone ([place]) 09/01/2014  . COPD (chronic obstructive pulmonary disease) [facility])     Past Surgical History  Procedure Laterality Date  . Replacement total knee Left 2004-2005?  Marland Kitchen Abdominal hysterectomy  1978  . Portacath placement  2015    Family History  Problem Relation Age of Onset  . Cancer Father     prostate  . Cancer Maternal Aunt     breast  . Cancer Cousin     breast  . Cancer Other     lung cancer - neice  . Cancer - Other Daughter     brain  . Cancer Mother     Pancreatic    Social History:  reports that she quit smoking about 2 years ago. Her smoking use included Cigarettes. She has a 10 pack-year smoking history. She has never used smokeless tobacco. She reports that she does not drink alcohol or use illicit drugs.  The patient is alone today.  Allergies:  Allergies  Allergen Reactions  . Fentanyl Other (See Comments)    "loopy and confused"    Current Medications: Current Outpatient Prescriptions  Medication Sig Dispense Refill  . acetaminophen  (TYLENOL) 500 MG tablet Take 1,000 mg by mouth every 8 (eight) hours as needed for headache.    . calcium carbonate (OS-CAL) 600 MG TABS tablet Take 1 tablet (600 mg total) by mouth 2 (two) times daily with a meal. 60 tablet 3  . clonazePAM (KLONOPIN) 0.5 MG tablet Take 1 tablet (0.5 mg total) by mouth 3 (three) times daily as needed for anxiety. 90 tablet 2  . furosemide (LASIX) 20 MG tablet TAKE ONE TABLET BY MOUTH EVERY OTHER DAYAS NEEDED FOR SWELLING 30 tablet 1  . Magnesium 250 MG TABS Take by mouth.    . ondansetron (ZOFRAN) 4 MG tablet Take 1 tablet (4 mg total) by mouth every 8 (eight) hours as needed for nausea or vomiting. 20 tablet 1  . oxycodone (OXY-IR) 5 MG capsule Take 1 tablet every 6 hours by mouth as needed for pain 60 capsule 0  . pantoprazole (PROTONIX) 40 MG tablet Take 1 tablet (40 mg total) by mouth daily. 30 tablet 6  . phosphorus (PHOSPHA 250 NEUTRAL) 155-852-130 MG tablet Take 1 tablet (250 mg total) by mouth 2 (two) times daily. 60 tablet 2  . potassium chloride (K-DUR) 10 MEQ tablet Take 1 tablet (10 mEq total) by mouth daily. 30 tablet 2   No current facility-administered medications for this visit.    Review of Systems:  GENERAL:  Feels good.  No fevers or sweats.  PERFORMANCE STATUS (ECOG):  0 HEENT:  Sinus symptoms improved on Claritin.  No visual changes, runny nose, sore throat, mouth sores or tenderness. Lungs: No shortness of breath or cough.  No hemoptysis. Cardiac:  No chest pain, palpitations, orthopnea, or PND. GI:  Appetite normal.  No nausea, vomiting, diarrhea, constipation, melena or hematochezia. GU:  No urgency, frequency, dysuria, or hematuria. Musculoskeletal:  No back pain.  No joint pain.  No muscle tenderness. Extremities:  No pain or swelling. Skin:  No rashes or skin changes. Neuro:  Restless legs at night.  No headache, numbness or weakness, balance or coordination issues. Endocrine:  No diabetes, thyroid issues, hot flashes or night  sweats. Psych:  No mood changes, depression or anxiety. Pain:  No focal pain.  Review of systems:  All other systems reviewed and found to be negative.   Physical Exam: There were no vitals taken for this visit.   General: Well-developed, well-nourished, no acute distress. Eyes: Pink conjunctiva, anicteric sclera. HEENT: Normocephalic, moist mucous membranes, clear oropharnyx. Lungs: Clear to auscultation bilaterally. Heart: Regular rate and rhythm. No rubs, murmurs, or gallops. Abdomen: Soft, nontender, nondistended. No organomegaly noted, normoactive bowel sounds. Musculoskeletal: No edema, cyanosis, or clubbing. Neuro: Alert, answering all questions appropriately. Cranial nerves grossly intact. Skin: No rashes or petechiae noted. Psych: Normal affect.   Clinical Support on 06/01/2015  Component Date Value Ref Range Status  . WBC 06/01/2015 5.7  3.6 - 11.0 K/uL Final  . RBC 06/01/2015 4.29  3.80 - 5.20 MIL/uL Final  . Hemoglobin 06/01/2015 13.4  12.0 - 16.0 g/dL Final  . HCT 06/01/2015 38.6  35.0 - 47.0 % Final  . MCV 06/01/2015 90.0  80.0 - 100.0 fL Final  . MCH 06/01/2015 31.3  26.0 - 34.0 pg Final  . MCHC 06/01/2015 34.7  32.0 - 36.0 g/dL Final  . RDW 06/01/2015 15.4* 11.5 - 14.5 % Final  . Platelets 06/01/2015 181  150 - 440 K/uL Final  . Neutrophils Relative % 06/01/2015 72   Final  . Neutro Abs 06/01/2015 4.1  1.4 - 6.5 K/uL Final  . Lymphocytes Relative 06/01/2015 19   Final  . Lymphs Abs 06/01/2015 1.1  1.0 - 3.6 K/uL Final  . Monocytes Relative 06/01/2015 7   Final  . Monocytes Absolute 06/01/2015 0.4  0.2 - 0.9 K/uL Final  . Eosinophils Relative 06/01/2015 1   Final  . Eosinophils Absolute 06/01/2015 0.1  0 - 0.7 K/uL Final  . Basophils Relative 06/01/2015 1   Final  . Basophils Absolute 06/01/2015 0.0  0 - 0.1 K/uL Final  . Sodium 06/01/2015 136  135 - 145 mmol/L Final  . Potassium 06/01/2015 3.4* 3.5 - 5.1 mmol/L Final  . Chloride 06/01/2015 102  101 - 111  mmol/L Final  . CO2 06/01/2015 26  22 - 32 mmol/L Final  . Glucose, Bld 06/01/2015 85  65 - 99 mg/dL Final  . BUN 06/01/2015 19  6 - 20 mg/dL Final  . Creatinine, Ser 06/01/2015 0.79  0.44 - 1.00 mg/dL Final  . Calcium 06/01/2015 8.9  8.9 - 10.3 mg/dL Final  . Total Protein 06/01/2015 9.0* 6.5 - 8.1 g/dL Final  . Albumin 06/01/2015 3.7  3.5 - 5.0 g/dL Final  . AST 06/01/2015 46* 15 - 41 U/L Final  . ALT 06/01/2015 24  14 - 54 U/L Final  . Alkaline Phosphatase 06/01/2015 97  38 - 126 U/L Final  . Total  Bilirubin 06/01/2015 0.9  0.3 - 1.2 mg/dL Final  . GFR calc non Af Amer 06/01/2015 >60  >60 mL/min Final  . GFR calc Af Amer 06/01/2015 >60  >60 mL/min Final   Comment: (NOTE) The eGFR has been calculated using the CKD EPI equation. This calculation has not been validated in all clinical situations. eGFR's persistently <60 mL/min signify possible Chronic Kidney Disease.   . Anion gap 06/01/2015 8  5 - 15 Final  . Magnesium 06/01/2015 1.9  1.7 - 2.4 mg/dL Final  . CA 27.29 06/01/2015 55.2* 0.0 - 38.6 U/mL Final   Comment: (NOTE) [employer]/ACS methodology Performed At: [facility] [address] [place], [place] [id number] Lindon Romp MD RW:[id number]     Assessment:  FELISA ZECHMAN is a 65 y.o. female with metastatic Her2/neu positive right breast cancer.  She initially presented in 03/2013 with an ulcerated breast mass and back pain.  Breast biopsy revealed lobular breast cancer.  Tumor was ER/PR positive and HER-2/neu positive. PET scan revealed bone metastasis. She received palliative radiation to T8 and T10 from 04/02-04/15/2015.  She declined chemotherapy. She initially began letrozole and Tykerb. Tykerb was discontinued after 1 dose. She received letrozole from 05/02/2013 until 01/07/2014 .  She received Herceptin from 06/02/2013 until 12/15/2013. She received fulvestrant monthly from 01/07/2014 until 05/27/2014.    She has received Xgeva monthly (03/31/2013 until  08/25/2014; last 03/30/2015).  She was switched to every 6 weeks Xgeva to coordinate with her treatment.  Herceptin was discontinued for a rising CA-27-29.  CA27.29 was 916.3 on 03/19/2013, 286.2 on 06/23/2013, 70.8 on 08/25/2013, 56.3 on 10/08/2013, 102.7 on 12/15/2013, 149.4 on 01/07/2014, 178.0 on 02/04/2014, 190.7 on 03/30/2014, 143.2 on 04/22/2014, 134.8 on 05/20/2014, 248.3 on 07/09/2014, 723.2 on 08/25/2014, 252.3 on 10/08/2014, 126 on 10/29/2014, 71.3 on 11/24/2014, 55 on 12/15/2014, 70.6 on 01/05/2015, 48.6 on 01/26/2015, 52.7 on 02/15/2015, 54.7 on 03/30/2015, and 54 on 04/20/2015.   She received Ibrance and Faslodex from 02/23/2014 until 07/27/2014.  Echo on 09/04/2014 revealed an EF of 55-65%.  Echo on 12/14/2014 revealed an EF of 55-60%.  Echo on 03/15/2015 revealed an EF of 55-60%.  PET scan on 08/28/2014 revealed significant interval worsening and multifocal osseous metastatic disease. Lesions had increased in number and metabolic activity. There were no extra osseous metastasis.  PET scan on 02/08/2015 revealed a complete metabolic response in the sclerotic osseous metastases, with no residual hypermetabolic metastatic disease.  She was admitted from 12/22/2014 - 12/24/2014 with E coli sepsis secondary to a UTI.  She was treated with Augmentin.  Serum protein is elevated.  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   Plan: 1.  Labs today:  CBC with diff, CMP, Mg, CA27.29. 2.  Cycle #13 Kadcyla. Will discuss the need for reimaging at next clinic visit. CA-27-29 continues to trend down. 3.  Xgeva with cycle 14. 4.  Schedule echo 06/15/2015. 5.  Continue magnesium 250 mg po q day. 6.  Rx: Continue oxycodone 5 mg po q 6 hours prn pain 7.  RTC in 3 weeks for MD assessment, labs (CBC with diff, CMP, Mg, CA27.29), and cycle #14 Kadcyla (TDM-1; trastuzumab emtansine).   Lloyd Huger, MD  06/02/2015, 11:52 AM

## 2015-06-15 ENCOUNTER — Ambulatory Visit
Admission: RE | Admit: 2015-06-15 | Discharge: 2015-06-15 | Disposition: A | Discharge status: Home / Self Care | Payer: Medicare Other | Source: Ambulatory Visit | Attending: Hematology and Oncology | Admitting: Hematology and Oncology

## 2015-06-15 DIAGNOSIS — Z5111 Encounter for antineoplastic chemotherapy: Secondary | ICD-10-CM | POA: Diagnosis not present

## 2015-06-15 DIAGNOSIS — C7951 Secondary malignant neoplasm of bone: Secondary | ICD-10-CM | POA: Diagnosis not present

## 2015-06-15 DIAGNOSIS — C50911 Malignant neoplasm of unspecified site of right female breast: Secondary | ICD-10-CM | POA: Insufficient documentation

## 2015-06-15 DIAGNOSIS — C50919 Malignant neoplasm of unspecified site of unspecified female breast: Secondary | ICD-10-CM | POA: Insufficient documentation

## 2015-06-15 DIAGNOSIS — I351 Nonrheumatic aortic (valve) insufficiency: Secondary | ICD-10-CM | POA: Diagnosis not present

## 2015-06-15 DIAGNOSIS — I340 Nonrheumatic mitral (valve) insufficiency: Secondary | ICD-10-CM | POA: Diagnosis not present

## 2015-06-15 DIAGNOSIS — R0602 Shortness of breath: Secondary | ICD-10-CM | POA: Diagnosis not present

## 2015-06-15 DIAGNOSIS — Z09 Encounter for follow-up examination after completed treatment for conditions other than malignant neoplasm: Secondary | ICD-10-CM | POA: Diagnosis present

## 2015-06-15 LAB — ECHOCARDIOGRAM COMPLETE
E decel time: 254 msec
E/e' ratio: 10.65
FS: 32 % (ref 28–44)
IVS/LV PW RATIO, ED: 0.97
LA ID, A-P, ES: 40 mm
LA diam end sys: 40 mm
LA diam index: 2.56 cm/m2
LA vol A4C: 38 ml
LA vol index: 27.1 mL/m2
LA vol: 42.3 mL
LV E/e' medial: 10.65
LV E/e'average: 10.65
LV PW d: 13.1 mm — AB (ref 0.6–1.1)
LV e' LATERAL: 6.74 cm/s
LVOT area: 3.14 cm2
LVOT diameter: 20 mm
MV Dec: 254
MV Peak grad: 2 mmHg
MV pk A vel: 107 m/s
MV pk E vel: 71.8 m/s
P 1/2 time: 467 ms
TAPSE: 13.6 mm
TDI e' lateral: 6.74
TDI e' medial: 4.13

## 2015-06-15 NOTE — Progress Notes (Signed)
*  PRELIMINARY RESULTS* Echocardiogram 2D Echocardiogram has been performed.  Elizabeth Bond 06/15/2015, 11:20 AM

## 2015-06-22 ENCOUNTER — Inpatient Hospital Stay: Payer: Medicare Other

## 2015-06-22 ENCOUNTER — Other Ambulatory Visit: Payer: Self-pay | Admitting: Hematology and Oncology

## 2015-06-22 ENCOUNTER — Encounter: Payer: Self-pay | Admitting: Hematology and Oncology

## 2015-06-22 ENCOUNTER — Inpatient Hospital Stay: Payer: Medicare Other | Attending: Hematology and Oncology

## 2015-06-22 ENCOUNTER — Inpatient Hospital Stay (HOSPITAL_BASED_OUTPATIENT_CLINIC_OR_DEPARTMENT_OTHER): Payer: Medicare Other | Admitting: Hematology and Oncology

## 2015-06-22 VITALS — BP 152/92 | HR 79 | Temp 96.3°F | Resp 18 | Wt 119.6 lb

## 2015-06-22 DIAGNOSIS — C50919 Malignant neoplasm of unspecified site of unspecified female breast: Secondary | ICD-10-CM

## 2015-06-22 DIAGNOSIS — C7951 Secondary malignant neoplasm of bone: Secondary | ICD-10-CM | POA: Diagnosis not present

## 2015-06-22 DIAGNOSIS — Z79899 Other long term (current) drug therapy: Secondary | ICD-10-CM

## 2015-06-22 DIAGNOSIS — K589 Irritable bowel syndrome without diarrhea: Secondary | ICD-10-CM | POA: Insufficient documentation

## 2015-06-22 DIAGNOSIS — Z87891 Personal history of nicotine dependence: Secondary | ICD-10-CM | POA: Diagnosis not present

## 2015-06-22 DIAGNOSIS — Z170 Estrogen receptor positive status [ER+]: Secondary | ICD-10-CM | POA: Insufficient documentation

## 2015-06-22 DIAGNOSIS — C50911 Malignant neoplasm of unspecified site of right female breast: Secondary | ICD-10-CM | POA: Diagnosis not present

## 2015-06-22 DIAGNOSIS — Z923 Personal history of irradiation: Secondary | ICD-10-CM | POA: Insufficient documentation

## 2015-06-22 DIAGNOSIS — J449 Chronic obstructive pulmonary disease, unspecified: Secondary | ICD-10-CM | POA: Insufficient documentation

## 2015-06-22 LAB — COMPREHENSIVE METABOLIC PANEL
ALT: 30 U/L (ref 14–54)
AST: 54 U/L — AB (ref 15–41)
Albumin: 3.6 g/dL (ref 3.5–5.0)
Alkaline Phosphatase: 87 U/L (ref 38–126)
Anion gap: 5 (ref 5–15)
BILIRUBIN TOTAL: 0.8 mg/dL (ref 0.3–1.2)
BUN: 16 mg/dL (ref 6–20)
CALCIUM: 9.1 mg/dL (ref 8.9–10.3)
CO2: 29 mmol/L (ref 22–32)
CREATININE: 0.68 mg/dL (ref 0.44–1.00)
Chloride: 103 mmol/L (ref 101–111)
GFR calc Af Amer: >60 mL/min (ref >60)
GLUCOSE: 88 mg/dL (ref 65–99)
Potassium: 3.5 mmol/L (ref 3.5–5.1)
Sodium: 137 mmol/L (ref 135–145)
Total Protein: 8.5 g/dL — ABNORMAL HIGH (ref 6.5–8.1)

## 2015-06-22 LAB — CBC WITH DIFFERENTIAL/PLATELET
BASOS ABS: 0 10*3/uL (ref 0–0.1)
Basophils Relative: 1 %
Eosinophils Absolute: 0.1 10*3/uL (ref 0–0.7)
Eosinophils Relative: 2 %
HEMATOCRIT: 38.1 % (ref 35.0–47.0)
Hemoglobin: 13.1 g/dL (ref 12.0–16.0)
LYMPHS PCT: 19 %
Lymphs Abs: 0.9 10*3/uL — ABNORMAL LOW (ref 1.0–3.6)
MCH: 31.5 pg (ref 26.0–34.0)
MCHC: 34.3 g/dL (ref 32.0–36.0)
MCV: 91.9 fL (ref 80.0–100.0)
MONO ABS: 0.4 10*3/uL (ref 0.2–0.9)
MONOS PCT: 9 %
NEUTROS ABS: 3.3 10*3/uL (ref 1.4–6.5)
Neutrophils Relative %: 69 %
Platelets: 161 10*3/uL (ref 150–440)
RBC: 4.14 MIL/uL (ref 3.80–5.20)
RDW: 16.2 % — AB (ref 11.5–14.5)
WBC: 4.7 10*3/uL (ref 3.6–11.0)

## 2015-06-22 LAB — MAGNESIUM: Magnesium: 1.8 mg/dL (ref 1.7–2.4)

## 2015-06-22 MED ORDER — HEPARIN SOD (PORK) LOCK FLUSH 100 UNIT/ML IV SOLN
500.0000 [IU] | Freq: Once | INTRAVENOUS | Status: AC | PRN
  Administered 2015-06-22: 500 [IU]
  Filled 2015-06-22: qty 5

## 2015-06-22 MED ORDER — DENOSUMAB 120 MG/1.7ML ~~LOC~~ SOLN
120.0000 mg | Freq: Once | SUBCUTANEOUS | Status: AC
  Administered 2015-06-22: 120 mg via SUBCUTANEOUS
  Filled 2015-06-22: qty 1.7

## 2015-06-22 MED ORDER — SODIUM CHLORIDE 0.9 % IV SOLN
3.6000 mg/kg | Freq: Once | INTRAVENOUS | Status: AC
  Administered 2015-06-22: 200 mg via INTRAVENOUS
  Filled 2015-06-22: qty 10

## 2015-06-22 MED ORDER — DIPHENHYDRAMINE HCL 25 MG PO CAPS
50.0000 mg | ORAL_CAPSULE | Freq: Once | ORAL | Status: AC
  Administered 2015-06-22: 50 mg via ORAL
  Filled 2015-06-22: qty 2

## 2015-06-22 MED ORDER — SODIUM CHLORIDE 0.9 % IJ SOLN
10.0000 mL | INTRAMUSCULAR | Status: DC | PRN
  Administered 2015-06-22: 10 mL
  Filled 2015-06-22: qty 10

## 2015-06-22 MED ORDER — SODIUM CHLORIDE 0.9 % IV SOLN
Freq: Once | INTRAVENOUS | Status: AC
  Administered 2015-06-22: 11:00:00 via INTRAVENOUS
  Filled 2015-06-22: qty 1000

## 2015-06-22 MED ORDER — ACETAMINOPHEN 325 MG PO TABS
650.0000 mg | ORAL_TABLET | Freq: Once | ORAL | Status: AC
  Administered 2015-06-22: 650 mg via ORAL
  Filled 2015-06-22: qty 2

## 2015-06-22 NOTE — Progress Notes (Signed)
[facility] day:  06/22/2015  Chief Complaint: Elizabeth Bond is a 65 y.o. female with metastatic Her2/neu positive right breast cancer who is seen for assessment prior to cycle #14 Kadcyla and Xgeva.  HPI:  The patient was last seen by me in the medical oncology clinic on 05/11/2015.  At that time, she received cycle #12 Kadcyla.  She also received Xgeva.  Symptomatically, she was doing well.  She had tapered off her Oxycontin.  Exam was unremarkable.  She received her chemotherapy uneventfully.  On 06/01/2015, she saw Dr. Delight Hoh.  She notes some abdominal pain at night for which she took her oxycodone 5 mg qhs prn.  She noted some issues with seasonal allergies.  CA27.29 was 55.2.  She received cycle #13 Kadcyla.  Echocardiogram on 06/15/2015 revealed an EF of 55-60%.  During the interim, she has continued to do well.  She voices no concerns.  She takes her pain medications only at night.   Past Medical History  Diagnosis Date  . IBS (irritable bowel syndrome)   . Cancer ([place]) 2015    breast and bone  . Breast cancer ([place])     right  . Metastasis to bone ([place]) 09/01/2014  . COPD (chronic obstructive pulmonary disease) [facility])     Past Surgical History  Procedure Laterality Date  . Replacement total knee Left 2004-2005?  Marland Kitchen Abdominal hysterectomy  1978  . Portacath placement  2015    Family History  Problem Relation Age of Onset  . Cancer Father     prostate  . Cancer Maternal Aunt     breast  . Cancer Cousin     breast  . Cancer Other     lung cancer - neice  . Cancer - Other Daughter     brain  . Cancer Mother     Pancreatic    Social History:  reports that she quit smoking about 2 years ago. Her smoking use included Cigarettes. She has a 10 pack-year smoking history. She has never used smokeless tobacco. She reports that she does not drink alcohol or use illicit drugs.  The patient is alone today.  Allergies:   Allergies  Allergen Reactions  . Fentanyl Other (See Comments)    "loopy and confused"    Current Medications: Current Outpatient Prescriptions  Medication Sig Dispense Refill  . acetaminophen (TYLENOL) 500 MG tablet Take 1,000 mg by mouth every 8 (eight) hours as needed for headache.    . calcium carbonate (OS-CAL) 600 MG TABS tablet Take 1 tablet (600 mg total) by mouth 2 (two) times daily with a meal. 60 tablet 3  . clonazePAM (KLONOPIN) 0.5 MG tablet Take 1 tablet (0.5 mg total) by mouth 3 (three) times daily as needed for anxiety. 90 tablet 2  . furosemide (LASIX) 20 MG tablet TAKE ONE TABLET BY MOUTH EVERY OTHER DAYAS NEEDED FOR SWELLING 30 tablet 1  . Magnesium 250 MG TABS Take by mouth.    Marland Kitchen oxycodone (OXY-IR) 5 MG capsule Take 1 tablet every 6 hours by mouth as needed for pain 60 capsule 0  . pantoprazole (PROTONIX) 40 MG tablet Take 1 tablet (40 mg total) by mouth daily. 30 tablet 6  . phosphorus (PHOSPHA 250 NEUTRAL) 155-852-130 MG tablet Take 1 tablet (250 mg total) by mouth 2 (two) times daily. 60 tablet 2  . potassium chloride (K-DUR) 10 MEQ tablet Take 1 tablet (10 mEq total) by mouth daily. 30 tablet  2  . ondansetron (ZOFRAN) 4 MG tablet Take 1 tablet (4 mg total) by mouth every 8 (eight) hours as needed for nausea or vomiting. (Patient not taking: Reported on 06/22/2015) 20 tablet 1   No current facility-administered medications for this visit.    Review of Systems:  GENERAL:  Feels good.  No fevers or sweats.  Weight up 3 pounds. PERFORMANCE STATUS (ECOG):  0 HEENT:  Sinus/ allergy symptoms.  No visual changes, runny nose, sore throat, mouth sores or tenderness. Lungs: No shortness of breath or cough.  No hemoptysis. Cardiac:  No chest pain, palpitations, orthopnea, or PND. GI:  Appetite normal.  No nausea, vomiting, diarrhea, constipation, melena or hematochezia. GU:  No urgency, frequency, dysuria, or hematuria. Musculoskeletal:  No back pain.  No joint pain.  No  muscle tenderness. Extremities:  No pain or swelling. Skin:  No rashes or skin changes. Neuro:  Restless legs at night.  No headache, numbness or weakness, balance or coordination issues. Endocrine:  No diabetes, thyroid issues, hot flashes or night sweats. Psych:  No mood changes, depression or anxiety. Pain:  No focal pain.  Takes pain pill at night. Review of systems:  All other systems reviewed and found to be negative.   Physical Exam: Blood pressure 152/92, pulse 79, temperature 96.3 F (35.7 C), temperature source Tympanic, resp. rate 18, weight 119 lb 9.6 oz (54.25 kg). GENERAL:  Well developed, well nourished, woman sitting comfortably in the exam room in no acute distress. MENTAL STATUS:  Alert and oriented to person, place and time. HEAD:  Short styled gray hair.  Normocephalic, atraumatic, face symmetric, no Cushingoid features. EYES: Hazel eyes.  Pupils equal round and reactive to light and accomodation.  No conjunctivitis or scleral icterus. ENT:  Oropharynx clear without lesion.  Tongue normal. Mucous membranes moist.  RESPIRATORY:  Clear to auscultation without rales, wheezes or rhonchi. CARDIOVASCULAR:  Regular rate and rhythm without murmur, rub or gallop.  No JVD. ABDOMEN:  Soft, non-tender, with active bowel sounds, and no hepatosplenomegaly.  No masses. SKIN:  No rashes, ulcers or lesions. EXTREMITIES: No edema, no skin discoloration or tenderness.  No palpable cords. LYMPH NODES: No palpable cervical, supraclavicular, axillary or inguinal adenopathy  NEUROLOGICAL: Unremarkable. PSYCH:  Appropriate.  NEUROLOGICAL: Unremarkable.  Appointment on 06/22/2015  Component Date Value Ref Range Status  . WBC 06/22/2015 4.7  3.6 - 11.0 K/uL Final  . RBC 06/22/2015 4.14  3.80 - 5.20 MIL/uL Final  . Hemoglobin 06/22/2015 13.1  12.0 - 16.0 g/dL Final  . HCT 06/22/2015 38.1  35.0 - 47.0 % Final  . MCV 06/22/2015 91.9  80.0 - 100.0 fL Final  . MCH 06/22/2015 31.5  26.0 - 34.0  pg Final  . MCHC 06/22/2015 34.3  32.0 - 36.0 g/dL Final  . RDW 06/22/2015 16.2* 11.5 - 14.5 % Final  . Platelets 06/22/2015 161  150 - 440 K/uL Final  . Neutrophils Relative % 06/22/2015 69   Final  . Neutro Abs 06/22/2015 3.3  1.4 - 6.5 K/uL Final  . Lymphocytes Relative 06/22/2015 19   Final  . Lymphs Abs 06/22/2015 0.9* 1.0 - 3.6 K/uL Final  . Monocytes Relative 06/22/2015 9   Final  . Monocytes Absolute 06/22/2015 0.4  0.2 - 0.9 K/uL Final  . Eosinophils Relative 06/22/2015 2   Final  . Eosinophils Absolute 06/22/2015 0.1  0 - 0.7 K/uL Final  . Basophils Relative 06/22/2015 1   Final  . Basophils Absolute 06/22/2015 0.0  0 - 0.1 K/uL Final  . Sodium 06/22/2015 137  135 - 145 mmol/L Final  . Potassium 06/22/2015 3.5  3.5 - 5.1 mmol/L Final  . Chloride 06/22/2015 103  101 - 111 mmol/L Final  . CO2 06/22/2015 29  22 - 32 mmol/L Final  . Glucose, Bld 06/22/2015 88  65 - 99 mg/dL Final  . BUN 06/22/2015 16  6 - 20 mg/dL Final  . Creatinine, Ser 06/22/2015 0.68  0.44 - 1.00 mg/dL Final  . Calcium 06/22/2015 9.1  8.9 - 10.3 mg/dL Final  . Total Protein 06/22/2015 8.5* 6.5 - 8.1 g/dL Final  . Albumin 06/22/2015 3.6  3.5 - 5.0 g/dL Final  . AST 06/22/2015 54* 15 - 41 U/L Final  . ALT 06/22/2015 30  14 - 54 U/L Final  . Alkaline Phosphatase 06/22/2015 87  38 - 126 U/L Final  . Total Bilirubin 06/22/2015 0.8  0.3 - 1.2 mg/dL Final  . GFR calc non Af Amer 06/22/2015 >60  >60 mL/min Final  . GFR calc Af Amer 06/22/2015 >60  >60 mL/min Final   Comment: (NOTE) The eGFR has been calculated using the CKD EPI equation. This calculation has not been validated in all clinical situations. eGFR's persistently <60 mL/min signify possible Chronic Kidney Disease.   . Anion gap 06/22/2015 5  5 - 15 Final  . Magnesium 06/22/2015 1.8  1.7 - 2.4 mg/dL Final    Assessment:  Elizabeth Bond is a 65 y.o. female with metastatic Her2/neu positive right breast cancer.  She initially presented in 03/2013 with  an ulcerated breast mass and back pain.  Breast biopsy revealed lobular breast cancer.  Tumor was ER/PR positive and HER-2/neu positive. PET scan revealed bone metastasis. She received palliative radiation to T8 and T10 from 04/02-04/15/2015.  She declined chemotherapy. She initially began letrozole and Tykerb. Tykerb was discontinued after 1 dose. She received letrozole from 05/02/2013 until 01/07/2014 .  She received Herceptin from 06/02/2013 until 12/15/2013. She received fulvestrant monthly from 01/07/2014 until 05/27/2014.    She has received Xgeva monthly (03/31/2013 until 08/25/2014; last 05/11/2015).  She was switched to every 6 weeks Xgeva to coordinate with her treatment.  Herceptin was discontinued for a rising CA-27-29.  CA27.29 was 916.3 on 03/19/2013, 286.2 on 06/23/2013, 70.8 on 08/25/2013, 56.3 on 10/08/2013, 102.7 on 12/15/2013, 149.4 on 01/07/2014, 178.0 on 02/04/2014, 190.7 on 03/30/2014, 143.2 on 04/22/2014, 134.8 on 05/20/2014, 248.3 on 07/09/2014, 723.2 on 08/25/2014, 252.3 on 10/08/2014, 126 on 10/29/2014, 71.3 on 11/24/2014, 55 on 12/15/2014, 70.6 on 01/05/2015, 48.6 on 01/26/2015, 52.7 on 02/15/2015, 54.7 on 03/30/2015, 54 on 04/20/2015, 55.2 on 06/01/2015, and 48.8 on 06/22/2015.   She received Ibrance and Faslodex from 02/23/2014 until 07/27/2014.    Echo on 09/04/2014 revealed an EF of 55-65%.  Echo on 12/14/2014, 03/15/2015, and 06/15/2015 revealed an EF of 55-60%.   PET scan on 08/28/2014 revealed significant interval worsening and multifocal osseous metastatic disease. Lesions had increased in number and metabolic activity. There were no extra osseous metastasis.  She is s/p 13 cycles of Kadcyla (09/10/2014 - 06/01/2015).  She is tolerating treatment well.  She denies any bone pain.   PET scan on 02/08/2015 revealed a complete metabolic response in the sclerotic osseous metastases, with no residual hypermetabolic metastatic disease.  She was admitted from 12/22/2014 -  12/24/2014 with E coli sepsis secondary to a UTI.  She was treated with Augmentin.  Serum protein is elevated.  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  Symptomatically, she feels good.  Exam is unremarkable.  Plan: 1.  Labs today:  CBC with diff, CMP, Mg, CA27.29. 2.  Review echo from 06/15/2015. 3.  Cycle #14 Kadcyla.  4.  Xgeva today. 5.  Schedule bone scan on 07/30/2015. 6.  Continue magnesium and potassium. 7.  RTC on 07/13/2015 for MD assessment, labs (CBC with diff, CMP, Mg), and cycle #15 Kadcyla (TDM-1; trastuzumab emtansine). 8.  RTC on 08/03/2015 for MD assessment, labs (CBC with diff, CMP, CA27.29), Xgeva and cycle #16 Kadcyla (TDM-1; trastuzumab emtansine).   Lequita Asal, MD  06/22/2015, 10:43 AM

## 2015-06-23 LAB — CANCER ANTIGEN 27.29: CA 27.29: 48.8 U/mL — ABNORMAL HIGH (ref 0.0–38.6)

## 2015-06-28 ENCOUNTER — Other Ambulatory Visit: Payer: Self-pay | Admitting: *Deleted

## 2015-06-28 DIAGNOSIS — C50911 Malignant neoplasm of unspecified site of right female breast: Secondary | ICD-10-CM

## 2015-06-28 DIAGNOSIS — C50919 Malignant neoplasm of unspecified site of unspecified female breast: Secondary | ICD-10-CM

## 2015-06-28 DIAGNOSIS — Z5111 Encounter for antineoplastic chemotherapy: Secondary | ICD-10-CM

## 2015-06-28 DIAGNOSIS — C7951 Secondary malignant neoplasm of bone: Secondary | ICD-10-CM

## 2015-06-28 MED ORDER — OXYCODONE HCL 5 MG PO CAPS: ORAL_CAPSULE | ORAL | Status: DC

## 2015-07-13 ENCOUNTER — Inpatient Hospital Stay (HOSPITAL_BASED_OUTPATIENT_CLINIC_OR_DEPARTMENT_OTHER): Payer: Medicare Other | Admitting: Hematology and Oncology

## 2015-07-13 ENCOUNTER — Inpatient Hospital Stay: Payer: Medicare Other

## 2015-07-13 ENCOUNTER — Inpatient Hospital Stay: Payer: Medicare Other | Attending: Hematology and Oncology

## 2015-07-13 ENCOUNTER — Other Ambulatory Visit: Payer: Self-pay | Admitting: Hematology and Oncology

## 2015-07-13 ENCOUNTER — Encounter: Payer: Self-pay | Admitting: Hematology and Oncology

## 2015-07-13 VITALS — BP 142/88 | HR 86 | Temp 97.8°F | Resp 18 | Wt 117.5 lb

## 2015-07-13 DIAGNOSIS — C7951 Secondary malignant neoplasm of bone: Secondary | ICD-10-CM

## 2015-07-13 DIAGNOSIS — C50911 Malignant neoplasm of unspecified site of right female breast: Secondary | ICD-10-CM | POA: Diagnosis not present

## 2015-07-13 DIAGNOSIS — Z5112 Encounter for antineoplastic immunotherapy: Secondary | ICD-10-CM | POA: Insufficient documentation

## 2015-07-13 DIAGNOSIS — Z79899 Other long term (current) drug therapy: Secondary | ICD-10-CM

## 2015-07-13 DIAGNOSIS — C50919 Malignant neoplasm of unspecified site of unspecified female breast: Secondary | ICD-10-CM

## 2015-07-13 DIAGNOSIS — J449 Chronic obstructive pulmonary disease, unspecified: Secondary | ICD-10-CM | POA: Diagnosis not present

## 2015-07-13 DIAGNOSIS — Z170 Estrogen receptor positive status [ER+]: Secondary | ICD-10-CM | POA: Insufficient documentation

## 2015-07-13 DIAGNOSIS — K589 Irritable bowel syndrome without diarrhea: Secondary | ICD-10-CM | POA: Diagnosis not present

## 2015-07-13 DIAGNOSIS — Z87891 Personal history of nicotine dependence: Secondary | ICD-10-CM | POA: Insufficient documentation

## 2015-07-13 DIAGNOSIS — Z5111 Encounter for antineoplastic chemotherapy: Secondary | ICD-10-CM

## 2015-07-13 LAB — MAGNESIUM: Magnesium: 1.8 mg/dL (ref 1.7–2.4)

## 2015-07-13 LAB — CBC WITH DIFFERENTIAL/PLATELET
Basophils Absolute: 0 10*3/uL (ref 0–0.1)
Basophils Relative: 1 %
Eosinophils Absolute: 0.1 10*3/uL (ref 0–0.7)
Eosinophils Relative: 2 %
HCT: 36.8 % (ref 35.0–47.0)
Hemoglobin: 12.9 g/dL (ref 12.0–16.0)
Lymphocytes Relative: 17 %
Lymphs Abs: 0.8 10*3/uL — ABNORMAL LOW (ref 1.0–3.6)
MCH: 32 pg (ref 26.0–34.0)
MCHC: 35.1 g/dL (ref 32.0–36.0)
MCV: 91 fL (ref 80.0–100.0)
Monocytes Absolute: 0.4 10*3/uL (ref 0.2–0.9)
Monocytes Relative: 8 %
Neutro Abs: 3.5 10*3/uL (ref 1.4–6.5)
Neutrophils Relative %: 72 %
Platelets: 175 10*3/uL (ref 150–440)
RBC: 4.05 MIL/uL (ref 3.80–5.20)
RDW: 16.2 % — ABNORMAL HIGH (ref 11.5–14.5)
WBC: 4.9 10*3/uL (ref 3.6–11.0)

## 2015-07-13 LAB — COMPREHENSIVE METABOLIC PANEL
ALT: 23 U/L (ref 14–54)
AST: 50 U/L — ABNORMAL HIGH (ref 15–41)
Albumin: 3.5 g/dL (ref 3.5–5.0)
Alkaline Phosphatase: 70 U/L (ref 38–126)
Anion gap: 8 (ref 5–15)
BUN: 18 mg/dL (ref 6–20)
CO2: 28 mmol/L (ref 22–32)
Calcium: 9.2 mg/dL (ref 8.9–10.3)
Chloride: 102 mmol/L (ref 101–111)
Creatinine, Ser: 0.76 mg/dL (ref 0.44–1.00)
GFR calc Af Amer: >60 mL/min (ref >60)
GFR calc non Af Amer: >60 mL/min (ref >60)
Glucose, Bld: 108 mg/dL — ABNORMAL HIGH (ref 65–99)
Potassium: 3.5 mmol/L (ref 3.5–5.1)
Sodium: 138 mmol/L (ref 135–145)
Total Bilirubin: 1 mg/dL (ref 0.3–1.2)
Total Protein: 8.3 g/dL — ABNORMAL HIGH (ref 6.5–8.1)

## 2015-07-13 MED ORDER — ACETAMINOPHEN 325 MG PO TABS
650.0000 mg | ORAL_TABLET | Freq: Once | ORAL | Status: AC
  Administered 2015-07-13: 650 mg via ORAL
  Filled 2015-07-13: qty 2

## 2015-07-13 MED ORDER — HEPARIN SOD (PORK) LOCK FLUSH 100 UNIT/ML IV SOLN
500.0000 [IU] | Freq: Once | INTRAVENOUS | Status: AC | PRN
Start: 2015-07-13 — End: 2015-07-13
  Administered 2015-07-13: 500 [IU]
  Filled 2015-07-13: qty 5

## 2015-07-13 MED ORDER — CLONAZEPAM 0.5 MG PO TABS: 0.5000 mg | ORAL_TABLET | Freq: Three times a day (TID) | ORAL | Status: DC | PRN

## 2015-07-13 MED ORDER — OXYCODONE HCL 5 MG PO CAPS: ORAL_CAPSULE | ORAL | Status: DC

## 2015-07-13 MED ORDER — K PHOS MONO-SOD PHOS DI & MONO 155-852-130 MG PO TABS: 250.0000 mg | ORAL_TABLET | Freq: Two times a day (BID) | ORAL | Status: DC

## 2015-07-13 MED ORDER — DIPHENHYDRAMINE HCL 25 MG PO CAPS
50.0000 mg | ORAL_CAPSULE | Freq: Once | ORAL | Status: AC
  Administered 2015-07-13: 50 mg via ORAL
  Filled 2015-07-13: qty 2

## 2015-07-13 MED ORDER — SODIUM CHLORIDE 0.9 % IV SOLN
3.6000 mg/kg | Freq: Once | INTRAVENOUS | Status: AC
  Administered 2015-07-13: 200 mg via INTRAVENOUS
  Filled 2015-07-13: qty 10

## 2015-07-13 MED ORDER — POTASSIUM CHLORIDE ER 10 MEQ PO TBCR: 10.0000 meq | EXTENDED_RELEASE_TABLET | Freq: Every day | ORAL | Status: DC

## 2015-07-13 MED ORDER — SODIUM CHLORIDE 0.9 % IV SOLN
Freq: Once | INTRAVENOUS | Status: AC
  Administered 2015-07-13: 11:00:00 via INTRAVENOUS
  Filled 2015-07-13: qty 1000

## 2015-07-13 NOTE — Progress Notes (Signed)
Patient is here for a follow up, patient has a cough, would like something to help with this.

## 2015-07-13 NOTE — Progress Notes (Signed)
[facility] day:  07/13/2015  Chief Complaint: Elizabeth Bond is a 65 y.o. female with metastatic Her2/neu positive right breast cancer who is seen for assessment prior to cycle #15 Kadcyla.  HPI:  The patient was last seen by me in the medical oncology clinic on 06/22/2015.  At that time, she received cycle #14 Kadcyla and her every 6 week Xgeva.  She denied any concern.  Exam was stable.  CA27.29 was 48.8.  Echo on 06/15/2015 revealed an EF of 55-60%.  During the interim, she has continued to do well.  She notes sinus issues.  She needs her medications refilled.   Past Medical History  Diagnosis Date  . IBS (irritable bowel syndrome)   . Cancer ([place]) 2015    breast and bone  . Breast cancer ([place])     right  . Metastasis to bone ([place]) 09/01/2014  . COPD (chronic obstructive pulmonary disease) [facility])     Past Surgical History  Procedure Laterality Date  . Replacement total knee Left 2004-2005?  Marland Kitchen Abdominal hysterectomy  1978  . Portacath placement  2015    Family History  Problem Relation Age of Onset  . Cancer Father     prostate  . Cancer Maternal Aunt     breast  . Cancer Cousin     breast  . Cancer Other     lung cancer - neice  . Cancer - Other Daughter     brain  . Cancer Mother     Pancreatic    Social History:  reports that she quit smoking about 2 years ago. Her smoking use included Cigarettes. She has a 10 pack-year smoking history. She has never used smokeless tobacco. She reports that she does not drink alcohol or use illicit drugs.  The patient is alone today.  Allergies:  Allergies  Allergen Reactions  . Fentanyl Other (See Comments)    "loopy and confused"    Current Medications: Current Outpatient Prescriptions  Medication Sig Dispense Refill  . acetaminophen (TYLENOL) 500 MG tablet Take 1,000 mg by mouth every 8 (eight) hours as needed for headache.    . calcium carbonate (OS-CAL) 600 MG TABS tablet  Take 1 tablet (600 mg total) by mouth 2 (two) times daily with a meal. 60 tablet 3  . clonazePAM (KLONOPIN) 0.5 MG tablet Take 1 tablet (0.5 mg total) by mouth 3 (three) times daily as needed for anxiety. 90 tablet 2  . furosemide (LASIX) 20 MG tablet TAKE ONE TABLET BY MOUTH EVERY OTHER DAYAS NEEDED FOR SWELLING 30 tablet 1  . Magnesium 250 MG TABS Take by mouth.    . ondansetron (ZOFRAN) 4 MG tablet Take 1 tablet (4 mg total) by mouth every 8 (eight) hours as needed for nausea or vomiting. 20 tablet 1  . oxycodone (OXY-IR) 5 MG capsule Take 1 tablet every 6 hours by mouth as needed for pain 60 capsule 0  . pantoprazole (PROTONIX) 40 MG tablet Take 1 tablet (40 mg total) by mouth daily. 30 tablet 6  . phosphorus (PHOSPHA 250 NEUTRAL) 155-852-130 MG tablet Take 1 tablet (250 mg total) by mouth 2 (two) times daily. 60 tablet 2  . potassium chloride (K-DUR) 10 MEQ tablet Take 1 tablet (10 mEq total) by mouth daily. 30 tablet 2   No current facility-administered medications for this visit.    Review of Systems:  GENERAL:  Feels good.  No fevers or sweats.  Weight down 2  pounds. PERFORMANCE STATUS (ECOG):  0 HEENT:  Sinus symptoms (dried up).  No visual changes, runny nose, sore throat, mouth sores or tenderness. Lungs: No shortness of breath.  Cough.  No hemoptysis. Cardiac:  No chest pain, palpitations, orthopnea, or PND. GI:  Appetite normal.  No nausea, vomiting, diarrhea, constipation, melena or hematochezia. GU:  No urgency, frequency, dysuria, or hematuria. Musculoskeletal:  No back pain.  No joint pain.  No muscle tenderness. Extremities:  No pain or swelling. Skin:  No rashes or skin changes. Neuro:  Restless legs at night.  No headache, numbness or weakness, balance or coordination issues. Endocrine:  No diabetes, thyroid issues, hot flashes or night sweats. Psych:  No mood changes, depression or anxiety. Pain:  No focal pain.  Takes pain pill at night. Review of systems:  All other  systems reviewed and found to be negative.   Physical Exam: Blood pressure 142/88, pulse 86, temperature 97.8 F (36.6 C), temperature source Tympanic, resp. rate 18, weight 117 lb 8.1 oz (53.3 kg). GENERAL:  Well developed, well nourished, woman sitting comfortably in the exam room in no acute distress. MENTAL STATUS:  Alert and oriented to person, place and time. HEAD:  Short styled gray hair.  Normocephalic, atraumatic, face symmetric, no Cushingoid features. EYES: Hazel eyes.  Pupils equal round and reactive to light and accomodation.  No conjunctivitis or scleral icterus. ENT:  Oropharynx clear without lesion.  Tongue normal. Mucous membranes moist.  RESPIRATORY:  Clear to auscultation without rales, wheezes or rhonchi. CARDIOVASCULAR:  Regular rate and rhythm without murmur, rub or gallop.  No JVD. ABDOMEN:  Soft, non-tender, with active bowel sounds, and no hepatosplenomegaly.  No masses. SKIN:  No rashes, ulcers or lesions. EXTREMITIES: No edema, no skin discoloration or tenderness.  No palpable cords. LYMPH NODES: No palpable cervical, supraclavicular, axillary or inguinal adenopathy  NEUROLOGICAL: Unremarkable. PSYCH:  Appropriate.    Clinical Support on 07/13/2015  Component Date Value Ref Range Status  . WBC 07/13/2015 4.9  3.6 - 11.0 K/uL Final  . RBC 07/13/2015 4.05  3.80 - 5.20 MIL/uL Final  . Hemoglobin 07/13/2015 12.9  12.0 - 16.0 g/dL Final  . HCT 07/13/2015 36.8  35.0 - 47.0 % Final  . MCV 07/13/2015 91.0  80.0 - 100.0 fL Final  . MCH 07/13/2015 32.0  26.0 - 34.0 pg Final  . MCHC 07/13/2015 35.1  32.0 - 36.0 g/dL Final  . RDW 07/13/2015 16.2* 11.5 - 14.5 % Final  . Platelets 07/13/2015 175  150 - 440 K/uL Final  . Neutrophils Relative % 07/13/2015 72   Final  . Neutro Abs 07/13/2015 3.5  1.4 - 6.5 K/uL Final  . Lymphocytes Relative 07/13/2015 17   Final  . Lymphs Abs 07/13/2015 0.8* 1.0 - 3.6 K/uL Final  . Monocytes Relative 07/13/2015 8   Final  . Monocytes  Absolute 07/13/2015 0.4  0.2 - 0.9 K/uL Final  . Eosinophils Relative 07/13/2015 2   Final  . Eosinophils Absolute 07/13/2015 0.1  0 - 0.7 K/uL Final  . Basophils Relative 07/13/2015 1   Final  . Basophils Absolute 07/13/2015 0.0  0 - 0.1 K/uL Final    Assessment:  Elizabeth Bond is a 65 y.o. female with metastatic Her2/neu positive right breast cancer.  She initially presented in 03/2013 with an ulcerated breast mass and back pain.  Breast biopsy revealed lobular breast cancer.  Tumor was ER/PR positive and HER-2/neu positive. PET scan revealed bone metastasis. She received palliative radiation  to T8 and T10 from 04/02-04/15/2015.  She declined chemotherapy. She initially began letrozole and Tykerb. Tykerb was discontinued after 1 dose. She received letrozole from 05/02/2013 until 01/07/2014 .  She received Herceptin from 06/02/2013 until 12/15/2013. She received fulvestrant monthly from 01/07/2014 until 05/27/2014.    She has received Xgeva monthly (03/31/2013 until 08/25/2014; last 05/11/2015).  She was switched to every 6 weeks Xgeva to coordinate with her treatment.  Herceptin was discontinued for a rising CA-27-29.  CA27.29 was 916.3 on 03/19/2013, 286.2 on 06/23/2013, 70.8 on 08/25/2013, 56.3 on 10/08/2013, 102.7 on 12/15/2013, 149.4 on 01/07/2014, 178.0 on 02/04/2014, 190.7 on 03/30/2014, 143.2 on 04/22/2014, 134.8 on 05/20/2014, 248.3 on 07/09/2014, 723.2 on 08/25/2014, 252.3 on 10/08/2014, 126 on 10/29/2014, 71.3 on 11/24/2014, 55 on 12/15/2014, 70.6 on 01/05/2015, 48.6 on 01/26/2015, 52.7 on 02/15/2015, 54.7 on 03/30/2015, 54 on 04/20/2015, 55.2 on 06/01/2015, and 48.8 on 06/22/2015.   She received Ibrance and Faslodex from 02/23/2014 until 07/27/2014.    Echo on 09/04/2014 revealed an EF of 55-65%.  Echo on 12/14/2014, 03/15/2015, and 06/15/2015 revealed an EF of 55-60%.   PET scan on 08/28/2014 revealed significant interval worsening and multifocal osseous metastatic disease. Lesions  had increased in number and metabolic activity. There were no extra osseous metastasis.  She is s/p 14 cycles of Kadcyla (09/10/2014 - 06/22/2015).  She is tolerating treatment well.  She denies any bone pain.   PET scan on 02/08/2015 revealed a complete metabolic response in the sclerotic osseous metastases, with no residual hypermetabolic metastatic disease.  She was admitted from 12/22/2014 - 12/24/2014 with E coli sepsis secondary to a UTI.  She was treated with Augmentin.  Serum protein is elevated.  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  Symptomatically, she feels good.  She has some minor sinus symptoms and an associated cough.  Exam is unremarkable.  Plan: 1.  Labs today:  CBC with diff, CMP, Mg. 2.  Cycle #15 Kadcyla.  3.  Follow-up bone scan on 07/30/2015. 4.  Continue magnesium and potassium. 5.  Refill oxycodone (#60), phosphorus, potassium, clonazepam. 6.  RTC on 08/03/2015 for MD assessment, labs (CBC with diff, CMP, Phos, CA27.29), Xgeva and cycle #16 Kadcyla (TDM-1; trastuzumab emtansine).   Lequita Asal, MD  07/13/2015, 9:59 AM

## 2015-07-30 ENCOUNTER — Encounter
Admission: RE | Admit: 2015-07-30 | Discharge: 2015-07-30 | Disposition: A | Discharge status: Home / Self Care | Payer: Medicare Other | Source: Ambulatory Visit | Attending: Hematology and Oncology | Admitting: Hematology and Oncology

## 2015-07-30 DIAGNOSIS — C7951 Secondary malignant neoplasm of bone: Secondary | ICD-10-CM | POA: Insufficient documentation

## 2015-07-30 DIAGNOSIS — C50919 Malignant neoplasm of unspecified site of unspecified female breast: Secondary | ICD-10-CM | POA: Insufficient documentation

## 2015-07-30 MED ORDER — TECHNETIUM TC 99M MEDRONATE IV KIT
22.4200 | PACK | Freq: Once | INTRAVENOUS | Status: AC | PRN
  Administered 2015-07-30: 22.42 via INTRAVENOUS

## 2015-08-02 ENCOUNTER — Telehealth: Payer: Self-pay

## 2015-08-02 ENCOUNTER — Other Ambulatory Visit: Payer: Self-pay | Admitting: Hematology and Oncology

## 2015-08-02 NOTE — Progress Notes (Signed)
[facility] day:  08/03/15  Chief Complaint: Elizabeth Bond is a 65 y.o. female with metastatic Her2/neu positive right breast cancer who is seen for assessment prior to cycle #16 Kadcyla.  HPI:  The patient was last seen by me in the medical oncology clinic on 07/13/2015.  At that time, she was doing well.  She had some minor sinus symptoms and an associated cough.  Exam was unremarkable.  Restaging bone scan was ordered.  Bone scan on 07/30/2015 revealed multifocal osseous metastases (sternum, thoracolumbar spine, multiple ribs, and right iliac crest). The dominant left acetabular metastasis noted on prior PET is not evident on the current study, improved.  However, some of the other sites of disease appear to have progressed.  There was no prior bone scan for comparison.  During the interim, she has continued to do well.  She denies any new symptoms.   Past Medical History:  Diagnosis Date  . Breast cancer ([place])    right  . Cancer ([place]) 2015   breast and bone  . COPD (chronic obstructive pulmonary disease) ([place])   . IBS (irritable bowel syndrome)   . Metastasis to bone ([place]) 09/01/2014    Past Surgical History:  Procedure Laterality Date  . ABDOMINAL HYSTERECTOMY  1978  . PORTACATH PLACEMENT  2015  . REPLACEMENT TOTAL KNEE Left 2004-2005?    Family History  Problem Relation Age of Onset  . Cancer Father     prostate  . Cancer Maternal Aunt     breast  . Cancer Cousin     breast  . Cancer Other     lung cancer - neice  . Cancer - Other Daughter     brain  . Cancer Mother     Pancreatic    Social History:  reports that she quit smoking about 2 years ago. Her smoking use included Cigarettes. She has a 10.00 pack-year smoking history. She has never used smokeless tobacco. She reports that she does not drink alcohol or use drugs.  The patient is alone today.  Allergies:  Allergies  Allergen Reactions  . Fentanyl Other (See  Comments)    "loopy and confused"    Current Medications: Current Outpatient Prescriptions  Medication Sig Dispense Refill  . acetaminophen (TYLENOL) 500 MG tablet Take 1,000 mg by mouth every 8 (eight) hours as needed for headache.    . calcium carbonate (OS-CAL) 600 MG TABS tablet Take 1 tablet (600 mg total) by mouth 2 (two) times daily with a meal. 60 tablet 3  . clonazePAM (KLONOPIN) 0.5 MG tablet Take 1 tablet (0.5 mg total) by mouth 3 (three) times daily as needed for anxiety. 90 tablet 2  . furosemide (LASIX) 20 MG tablet TAKE ONE TABLET BY MOUTH EVERY OTHER DAYAS NEEDED FOR SWELLING 30 tablet 1  . loratadine (CLARITIN) 10 MG tablet Take 10 mg by mouth daily.    . Magnesium 250 MG TABS Take by mouth.    . ondansetron (ZOFRAN) 4 MG tablet Take 1 tablet (4 mg total) by mouth every 8 (eight) hours as needed for nausea or vomiting. 20 tablet 1  . oxycodone (OXY-IR) 5 MG capsule Take 1 tablet every 6 hours by mouth as needed for pain 60 capsule 0  . pantoprazole (PROTONIX) 40 MG tablet Take 1 tablet (40 mg total) by mouth daily. 30 tablet 6  . phosphorus (PHOSPHA 250 NEUTRAL) 155-852-130 MG tablet Take 1 tablet (250 mg total) by mouth  2 (two) times daily. 60 tablet 2  . potassium chloride (K-DUR) 10 MEQ tablet Take 1 tablet (10 mEq total) by mouth daily. 30 tablet 2   No current facility-administered medications for this visit.    Facility-Administered Medications Ordered in Other Visits  Medication Dose Route Frequency Provider Last Rate Last Dose  . heparin lock flush 100 unit/mL  500 Units Intracatheter Once PRN Lequita Asal, MD      . sodium chloride 0.9 % injection 10 mL  10 mL Intracatheter PRN Lequita Asal, MD   10 mL at 08/03/15 0841    Review of Systems:  GENERAL:  Feels good.  No fevers or sweats.  Weight down 1 pound. PERFORMANCE STATUS (ECOG):  0 HEENT:  No visual changes, runny nose, sore throat, mouth sores or tenderness. Lungs: No shortness of breath.   Chronic cough.  No hemoptysis. Cardiac:  No chest pain, palpitations, orthopnea, or PND. GI:  Appetite good.  No nausea, vomiting, diarrhea, constipation, melena or hematochezia. GU:  No urgency, frequency, dysuria, or hematuria. Musculoskeletal:  No back pain.  No joint pain.  No muscle tenderness. Extremities:  No pain or swelling. Skin:  No rashes or skin changes. Neuro:  Restless legs at night.  No headache, numbness or weakness, balance or coordination issues. Endocrine:  No diabetes, thyroid issues, hot flashes or night sweats. Psych:  No mood changes, depression or anxiety. Pain:  No focal pain.  Takes pain pill at night. Review of systems:  All other systems reviewed and found to be negative.   Physical Exam: Blood pressure (!) 180/95, pulse 88, temperature 97.1 F (36.2 C), temperature source Tympanic, height 5' 3"  (1.6 m), weight 116 lb 10 oz (52.9 kg), SpO2 96 %. GENERAL:  Well developed, well nourished, woman sitting comfortably in the exam room in no acute distress. MENTAL STATUS:  Alert and oriented to person, place and time. HEAD:  Short styled gray hair.  Normocephalic, atraumatic, face symmetric, no Cushingoid features. EYES: Hazel eyes.  Pupils equal round and reactive to light and accomodation.  No conjunctivitis or scleral icterus. ENT:  Oropharynx clear without lesion.  Tongue normal. Mucous membranes moist.  RESPIRATORY:  Clear to auscultation without rales, wheezes or rhonchi. CARDIOVASCULAR:  Regular rate and rhythm without murmur, rub or gallop.  No JVD. ABDOMEN:  Soft, non-tender, with active bowel sounds, and no hepatosplenomegaly.  No masses. BACK:  No pain on palpation.  SKIN:  No rashes, ulcers or lesions. EXTREMITIES: No edema, no skin discoloration or tenderness.  No palpable cords. LYMPH NODES: No palpable cervical, supraclavicular, axillary or inguinal adenopathy  NEUROLOGICAL: Unremarkable. PSYCH:  Appropriate.    Infusion on 08/03/2015  Component  Date Value Ref Range Status  . WBC 08/03/2015 5.9  3.6 - 11.0 K/uL Final  . RBC 08/03/2015 4.20  3.80 - 5.20 MIL/uL Final  . Hemoglobin 08/03/2015 13.6  12.0 - 16.0 g/dL Final  . HCT 08/03/2015 38.5  35.0 - 47.0 % Final  . MCV 08/03/2015 91.7  80.0 - 100.0 fL Final  . MCH 08/03/2015 32.3  26.0 - 34.0 pg Final  . MCHC 08/03/2015 35.3  32.0 - 36.0 g/dL Final  . RDW 08/03/2015 16.7* 11.5 - 14.5 % Final  . Platelets 08/03/2015 144* 150 - 440 K/uL Final  . Neutrophils Relative % 08/03/2015 74  % Final  . Neutro Abs 08/03/2015 4.4  1.4 - 6.5 K/uL Final  . Lymphocytes Relative 08/03/2015 16  % Final  . Lymphs Abs  08/03/2015 1.0  1.0 - 3.6 K/uL Final  . Monocytes Relative 08/03/2015 8  % Final  . Monocytes Absolute 08/03/2015 0.4  0.2 - 0.9 K/uL Final  . Eosinophils Relative 08/03/2015 1  % Final  . Eosinophils Absolute 08/03/2015 0.1  0 - 0.7 K/uL Final  . Basophils Relative 08/03/2015 1  % Final  . Basophils Absolute 08/03/2015 0.0  0 - 0.1 K/uL Final  . Sodium 08/03/2015 133* 135 - 145 mmol/L Final  . Potassium 08/03/2015 3.5  3.5 - 5.1 mmol/L Final  . Chloride 08/03/2015 103  101 - 111 mmol/L Final  . CO2 08/03/2015 24  22 - 32 mmol/L Final  . Glucose, Bld 08/03/2015 99  65 - 99 mg/dL Final  . BUN 08/03/2015 14  6 - 20 mg/dL Final  . Creatinine, Ser 08/03/2015 0.64  0.44 - 1.00 mg/dL Final  . Calcium 08/03/2015 9.0  8.9 - 10.3 mg/dL Final  . Total Protein 08/03/2015 8.7* 6.5 - 8.1 g/dL Final  . Albumin 08/03/2015 3.7  3.5 - 5.0 g/dL Final  . AST 08/03/2015 49* 15 - 41 U/L Final  . ALT 08/03/2015 26  14 - 54 U/L Final  . Alkaline Phosphatase 08/03/2015 74  38 - 126 U/L Final  . Total Bilirubin 08/03/2015 1.1  0.3 - 1.2 mg/dL Final  . GFR calc non Af Amer 08/03/2015 >60  >60 mL/min Final  . GFR calc Af Amer 08/03/2015 >60  >60 mL/min Final   Comment: (NOTE) The eGFR has been calculated using the CKD EPI equation. This calculation has not been validated in all clinical  situations. eGFR's persistently <60 mL/min signify possible Chronic Kidney Disease.   . Anion gap 08/03/2015 6  5 - 15 Final  . Magnesium 08/03/2015 1.9  1.7 - 2.4 mg/dL Final    Assessment:  Elizabeth Bond is a 65 y.o. female with metastatic Her2/neu positive right breast cancer.  She initially presented in 03/2013 with an ulcerated breast mass and back pain.  Breast biopsy revealed lobular breast cancer.  Tumor was ER/PR positive and HER-2/neu positive. PET scan revealed bone metastasis. She received palliative radiation to T8 and T10 from 04/02-04/15/2015.  She declined chemotherapy. She initially began letrozole and Tykerb. Tykerb was discontinued after 1 dose. She received letrozole from 05/02/2013 until 01/07/2014 .  She received Herceptin from 06/02/2013 until 12/15/2013. She received fulvestrant monthly from 01/07/2014 until 05/27/2014.    She has received Xgeva monthly (03/31/2013 until 08/25/2014; last 06/22/2015).  She was switched to every 6 weeks Xgeva to coordinate with her treatment.  Herceptin was discontinued for a rising CA-27-29.  CA27.29 was 916.3 on 03/19/2013, 286.2 on 06/23/2013, 70.8 on 08/25/2013, 56.3 on 10/08/2013, 102.7 on 12/15/2013, 149.4 on 01/07/2014, 178.0 on 02/04/2014, 190.7 on 03/30/2014, 143.2 on 04/22/2014, 134.8 on 05/20/2014, 248.3 on 07/09/2014, 723.2 on 08/25/2014, 252.3 on 10/08/2014, 126 on 10/29/2014, 71.3 on 11/24/2014, 55 on 12/15/2014, 70.6 on 01/05/2015, 48.6 on 01/26/2015, 52.7 on 02/15/2015, 54.7 on 03/30/2015, 54 on 04/20/2015, 55.2 on 06/01/2015, and 48.8 on 06/22/2015.   She received Ibrance and Faslodex from 02/23/2014 until 07/27/2014.    Echo on 09/04/2014 revealed an EF of 55-65%.  Echo on 12/14/2014, 03/15/2015, and 06/15/2015 revealed an EF of 55-60%.   PET scan on 08/28/2014 revealed significant interval worsening and multifocal osseous metastatic disease. Lesions had increased in number and metabolic activity. There were no extra osseous  metastasis.  She is s/p 15 cycles of Kadcyla (09/10/2014 - 07/13/2015).  She is tolerating  treatment well.  She denies any bone pain.   PET scan on 02/08/2015 revealed a complete metabolic response in the sclerotic osseous metastases, with no residual hypermetabolic metastatic disease.  Bone scan on 07/30/2015 revealed multifocal osseous metastases (sternum, thoracolumbar spine, multiple ribs, and right iliac crest). The dominant left acetabular metastasis noted on prior PET was not evident. Some of the other sites of disease appear to have progressed.  There was no prior bone scan for comparison.  She was admitted from 12/22/2014 - 12/24/2014 with E coli sepsis secondary to a UTI.  She was treated with Augmentin.  Serum protein is elevated.  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  Symptomatically, she feels good.  She denies any bone pain.  Exam is unremarkable.  Alkaline phosphatase is normal.  Plan: 1.  Labs today:  CBC with diff, CMP, Phos, CA27.29. 2.  Review bone scan.  Unclear significance of bone lesions.  PET may not correlate with bone scan.  Discuss plan to continue current therapy given no symptoms and decreasing tumor marker.  Discuss plan for correlative PET scan.   3.  Cycle #16 Kadcyla.  4.  Xgeva today. 5.  Refill oxycodone (#60). 6.  RTC in 3 weeks for MD assessment, labs (CBC with diff, CMP,  CA27.29), review of PET scan, and cycle #17 Kadcyla (TDM-1; trastuzumab emtansine).   Lequita Asal, MD  08/03/2015, 9:15 AM

## 2015-08-03 ENCOUNTER — Encounter: Payer: Self-pay | Admitting: Hematology and Oncology

## 2015-08-03 ENCOUNTER — Inpatient Hospital Stay: Payer: Medicare Other

## 2015-08-03 ENCOUNTER — Inpatient Hospital Stay: Payer: Medicare Other | Attending: Hematology and Oncology | Admitting: Hematology and Oncology

## 2015-08-03 VITALS — BP 164/80 | HR 88 | Temp 97.1°F | Ht 63.0 in | Wt 116.6 lb

## 2015-08-03 VITALS — BP 164/91 | HR 88 | Resp 20

## 2015-08-03 DIAGNOSIS — D696 Thrombocytopenia, unspecified: Secondary | ICD-10-CM | POA: Insufficient documentation

## 2015-08-03 DIAGNOSIS — C7951 Secondary malignant neoplasm of bone: Secondary | ICD-10-CM

## 2015-08-03 DIAGNOSIS — K589 Irritable bowel syndrome without diarrhea: Secondary | ICD-10-CM | POA: Insufficient documentation

## 2015-08-03 DIAGNOSIS — Z170 Estrogen receptor positive status [ER+]: Secondary | ICD-10-CM | POA: Diagnosis not present

## 2015-08-03 DIAGNOSIS — C50911 Malignant neoplasm of unspecified site of right female breast: Secondary | ICD-10-CM

## 2015-08-03 DIAGNOSIS — Z87891 Personal history of nicotine dependence: Secondary | ICD-10-CM | POA: Diagnosis not present

## 2015-08-03 DIAGNOSIS — C50919 Malignant neoplasm of unspecified site of unspecified female breast: Secondary | ICD-10-CM

## 2015-08-03 DIAGNOSIS — Z5112 Encounter for antineoplastic immunotherapy: Secondary | ICD-10-CM | POA: Diagnosis not present

## 2015-08-03 DIAGNOSIS — J449 Chronic obstructive pulmonary disease, unspecified: Secondary | ICD-10-CM | POA: Insufficient documentation

## 2015-08-03 DIAGNOSIS — D890 Polyclonal hypergammaglobulinemia: Secondary | ICD-10-CM

## 2015-08-03 DIAGNOSIS — Z5111 Encounter for antineoplastic chemotherapy: Secondary | ICD-10-CM

## 2015-08-03 DIAGNOSIS — Z79899 Other long term (current) drug therapy: Secondary | ICD-10-CM | POA: Insufficient documentation

## 2015-08-03 LAB — COMPREHENSIVE METABOLIC PANEL
ALT: 26 U/L (ref 14–54)
AST: 49 U/L — ABNORMAL HIGH (ref 15–41)
Albumin: 3.7 g/dL (ref 3.5–5.0)
Alkaline Phosphatase: 74 U/L (ref 38–126)
Anion gap: 6 (ref 5–15)
BUN: 14 mg/dL (ref 6–20)
CO2: 24 mmol/L (ref 22–32)
Calcium: 9 mg/dL (ref 8.9–10.3)
Chloride: 103 mmol/L (ref 101–111)
Creatinine, Ser: 0.64 mg/dL (ref 0.44–1.00)
GFR calc Af Amer: >60 mL/min (ref >60)
GFR calc non Af Amer: >60 mL/min (ref >60)
Glucose, Bld: 99 mg/dL (ref 65–99)
Potassium: 3.5 mmol/L (ref 3.5–5.1)
Sodium: 133 mmol/L — ABNORMAL LOW (ref 135–145)
Total Bilirubin: 1.1 mg/dL (ref 0.3–1.2)
Total Protein: 8.7 g/dL — ABNORMAL HIGH (ref 6.5–8.1)

## 2015-08-03 LAB — CBC WITH DIFFERENTIAL/PLATELET
Basophils Absolute: 0 10*3/uL (ref 0–0.1)
Basophils Relative: 1 %
Eosinophils Absolute: 0.1 10*3/uL (ref 0–0.7)
Eosinophils Relative: 1 %
HCT: 38.5 % (ref 35.0–47.0)
Hemoglobin: 13.6 g/dL (ref 12.0–16.0)
Lymphocytes Relative: 16 %
Lymphs Abs: 1 10*3/uL (ref 1.0–3.6)
MCH: 32.3 pg (ref 26.0–34.0)
MCHC: 35.3 g/dL (ref 32.0–36.0)
MCV: 91.7 fL (ref 80.0–100.0)
Monocytes Absolute: 0.4 10*3/uL (ref 0.2–0.9)
Monocytes Relative: 8 %
Neutro Abs: 4.4 10*3/uL (ref 1.4–6.5)
Neutrophils Relative %: 74 %
Platelets: 144 10*3/uL — ABNORMAL LOW (ref 150–440)
RBC: 4.2 MIL/uL (ref 3.80–5.20)
RDW: 16.7 % — ABNORMAL HIGH (ref 11.5–14.5)
WBC: 5.9 10*3/uL (ref 3.6–11.0)

## 2015-08-03 LAB — PHOSPHORUS: Phosphorus: 3.3 mg/dL (ref 2.5–4.6)

## 2015-08-03 LAB — MAGNESIUM: Magnesium: 1.9 mg/dL (ref 1.7–2.4)

## 2015-08-03 MED ORDER — HEPARIN SOD (PORK) LOCK FLUSH 100 UNIT/ML IV SOLN
500.0000 [IU] | Freq: Once | INTRAVENOUS | Status: AC | PRN
  Administered 2015-08-03: 500 [IU]
  Filled 2015-08-03: qty 5

## 2015-08-03 MED ORDER — ACETAMINOPHEN 325 MG PO TABS
650.0000 mg | ORAL_TABLET | Freq: Once | ORAL | Status: AC
  Administered 2015-08-03: 650 mg via ORAL
  Filled 2015-08-03: qty 2

## 2015-08-03 MED ORDER — DENOSUMAB 120 MG/1.7ML ~~LOC~~ SOLN
120.0000 mg | Freq: Once | SUBCUTANEOUS | Status: AC
  Administered 2015-08-03: 120 mg via SUBCUTANEOUS
  Filled 2015-08-03: qty 1.7

## 2015-08-03 MED ORDER — SODIUM CHLORIDE 0.9 % IV SOLN
3.6000 mg/kg | Freq: Once | INTRAVENOUS | Status: AC
  Administered 2015-08-03: 200 mg via INTRAVENOUS
  Filled 2015-08-03: qty 10

## 2015-08-03 MED ORDER — DIPHENHYDRAMINE HCL 25 MG PO CAPS
50.0000 mg | ORAL_CAPSULE | Freq: Once | ORAL | Status: AC
  Administered 2015-08-03: 50 mg via ORAL
  Filled 2015-08-03: qty 2

## 2015-08-03 MED ORDER — OXYCODONE HCL 5 MG PO CAPS: ORAL_CAPSULE | ORAL | 0 refills | Status: DC

## 2015-08-03 MED ORDER — SODIUM CHLORIDE 0.9 % IV SOLN
Freq: Once | INTRAVENOUS | Status: AC
  Administered 2015-08-03: 10:00:00 via INTRAVENOUS
  Filled 2015-08-03: qty 1000

## 2015-08-03 MED ORDER — SODIUM CHLORIDE 0.9 % IJ SOLN
10.0000 mL | INTRAMUSCULAR | Status: DC | PRN
  Administered 2015-08-03: 10 mL
  Filled 2015-08-03: qty 10

## 2015-08-03 NOTE — Progress Notes (Signed)
Patient here for results BP elevated. Advised patient to monitor BP and see PCP if it continues to be elevated. Will recheck after MD visit.

## 2015-08-04 LAB — CANCER ANTIGEN 27.29: CA 27.29: 49.7 U/mL — ABNORMAL HIGH (ref 0.0–38.6)

## 2015-08-05 ENCOUNTER — Other Ambulatory Visit: Payer: Self-pay | Admitting: *Deleted

## 2015-08-05 DIAGNOSIS — C50911 Malignant neoplasm of unspecified site of right female breast: Secondary | ICD-10-CM

## 2015-08-05 DIAGNOSIS — C7951 Secondary malignant neoplasm of bone: Secondary | ICD-10-CM

## 2015-08-17 ENCOUNTER — Ambulatory Visit: Payer: Medicare Other

## 2015-08-19 ENCOUNTER — Encounter: Admission: RE | Admit: 2015-08-19 | Payer: Medicare Other | Source: Ambulatory Visit

## 2015-08-19 ENCOUNTER — Other Ambulatory Visit: Payer: Medicare Other

## 2015-08-22 NOTE — Progress Notes (Signed)
[facility] day:  08/24/15  Chief Complaint: Elizabeth Bond is a 65 y.o. female with metastatic Her2/neu positive right breast cancer who is seen for assessment prior to cycle #17 Kadcyla.  HPI:  The patient was last seen by me in the medical oncology clinic on 08/03/2015.  At that time, she was doing well.  Bone scan was reviewed.  The dominant left acetabular metastasis noted on prior PET was not evident on the current study.  However, some of the other sites of disease appeared to have progressed.  CA27.29 was 49.7.  We discussed plan for correlative PET scan.  She received cycle #16 Kadcyla.  She also received Xgeva.  PET scan on 08/23/2015 revealed no new or progressive hypermetabolic metastatic disease.  There was stable mildly hypermetabolic sclerotic osseous metastasis in the right proximal femoral metaphysis.  There was stable nonspecific mild patchy hypermetabolism in the upper and lower spine correlating with mixed lytic and sclerotic osseous metastases in the CT images.  There was absence of marrow hypermetabolism in the lower thoracic spine and upper lumbar spine (unchanged).  During the interim, she has continued to do well.  She denies any new symptoms. She states that her blood pressure is up today as she was worried about her scans.   Past Medical History:  Diagnosis Date  . Breast cancer ([place])    right  . Cancer ([place]) 2015   breast and bone  . COPD (chronic obstructive pulmonary disease) ([place])   . IBS (irritable bowel syndrome)   . 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 ABDOMINAL HYSTERECTOMY  1978  . PORTACATH PLACEMENT  2015  . REPLACEMENT TOTAL KNEE Left 2004-2005?    Family History  Problem Relation Age of Onset  . Cancer Father     prostate  . Cancer Mother     Pancreatic  . Cancer Maternal Aunt     breast  . Cancer Cousin     breast  . Cancer Other     lung  cancer - neice  . Cancer - Other Daughter     brain    Social History:  reports that she quit smoking about 2 years ago. Her smoking use included Cigarettes. She has a 10.00 pack-year smoking history. She has never used smokeless tobacco. She reports that she does not drink alcohol or use drugs.  The patient is alone today.  Allergies:  Allergies  Allergen Reactions  . Fentanyl Other (See Comments)    "loopy and confused"    Current Medications: Current Outpatient Prescriptions  Medication Sig Dispense Refill  . acetaminophen (TYLENOL) 500 MG tablet Take 1,000 mg by mouth every 8 (eight) hours as needed for headache.    . Calcium Carb-Cholecalciferol (CALCIUM-VITAMIN D3) 600-400 MG-UNIT TABS Take 1 tablet by mouth 2 (two) times daily.    . clonazePAM (KLONOPIN) 0.5 MG tablet Take 1 tablet (0.5 mg total) by mouth 3 (three) times daily as needed for anxiety. 90 tablet 2  . furosemide (LASIX) 20 MG tablet TAKE ONE TABLET BY MOUTH EVERY OTHER DAYAS NEEDED FOR SWELLING 30 tablet 1  . loratadine (CLARITIN) 10 MG tablet Take 10 mg by mouth daily.    . Magnesium 250 MG TABS Take 1 each by mouth daily.     . ondansetron (ZOFRAN) 4 MG tablet Take 1 tablet (4 mg total) by mouth every 8 (eight) hours as needed for  nausea or vomiting. 20 tablet 1  . oxycodone (OXY-IR) 5 MG capsule Take 1 tablet every 6 hours by mouth as needed for pain 60 capsule 0  . pantoprazole (PROTONIX) 40 MG tablet Take 1 tablet (40 mg total) by mouth daily. 30 tablet 6  . phosphorus (PHOSPHA 250 NEUTRAL) 155-852-130 MG tablet Take 1 tablet (250 mg total) by mouth 2 (two) times daily. 60 tablet 2  . potassium chloride (K-DUR) 10 MEQ tablet Take 1 tablet (10 mEq total) by mouth daily. 30 tablet 2   No current facility-administered medications for this visit.    Facility-Administered Medications Ordered in Other Visits  Medication Dose Route Frequency Provider Last Rate Last Dose  . heparin lock flush 100 unit/mL  500 Units  Intravenous Once Lequita Asal, MD      . sodium chloride 0.9 % injection 10 mL  10 mL Intravenous Once Lequita Asal, MD        Review of Systems:  GENERAL:  Feels good.  No fevers or sweats.  Weight stable. PERFORMANCE STATUS (ECOG):  0 HEENT:  No visual changes, runny nose, sore throat, mouth sores or tenderness. Lungs: No shortness of breath.  Chronic cough.  No hemoptysis. Cardiac:  No chest pain, palpitations, orthopnea, or PND.  Blood pressure up as worried about scans GI:  Appetite good.  No nausea, vomiting, diarrhea, constipation, melena or hematochezia. GU:  No urgency, frequency, dysuria, or hematuria. Musculoskeletal:  No back pain.  No joint pain.  No muscle tenderness. Extremities:  No pain or swelling. Skin:  No rashes or skin changes. Neuro:  Restless legs at night.  No headache, numbness or weakness, balance or coordination issues. Endocrine:  No diabetes, thyroid issues, hot flashes or night sweats. Psych:  No mood changes, depression or anxiety. Pain:  No focal pain.  Takes pain pill at night. Review of systems:  All other systems reviewed and found to be negative.   Physical Exam: Blood pressure (!) 169/90, pulse 80, temperature 97.8 F (36.6 C), temperature source Tympanic, resp. rate 18, height _0  (1.6 m), weight 116 lb 13.5 oz (53 kg). GENERAL:  Well developed, well nourished, woman sitting comfortably in the exam room in no acute distress. MENTAL STATUS:  Alert and oriented to person, place and time. HEAD:  Short styled gray hair.  Normocephalic, atraumatic, face symmetric, no Cushingoid features. EYES: Hazel eyes.  Pupils equal round and reactive to light and accomodation.  No conjunctivitis or scleral icterus. ENT:  Oropharynx clear without lesion.  Tongue normal. Mucous membranes moist.  RESPIRATORY:  Clear to auscultation without rales, wheezes or rhonchi. CARDIOVASCULAR:  Regular rate and rhythm without murmur, rub or gallop.  No JVD. ABDOMEN:   Soft, non-tender, with active bowel sounds, and no hepatosplenomegaly.  No masses. SKIN:  No rashes, ulcers or lesions. EXTREMITIES: No edema, no skin discoloration or tenderness.  No palpable cords. LYMPH NODES: No palpable cervical, supraclavicular, axillary or inguinal adenopathy  NEUROLOGICAL: Unremarkable. PSYCH:  Appropriate.    Infusion on 08/24/2015  Component Date Value Ref Range Status  . WBC 08/24/2015 4.8  3.6 - 11.0 K/uL Final  . RBC 08/24/2015 3.90  3.80 - 5.20 MIL/uL Final  . Hemoglobin 08/24/2015 12.9  12.0 - 16.0 g/dL Final  . HCT 08/24/2015 36.7  35.0 - 47.0 % Final  . MCV 08/24/2015 94.0  80.0 - 100.0 fL Final  . MCH 08/24/2015 33.1  26.0 - 34.0 pg Final  . MCHC 08/24/2015 35.3  32.0 -  36.0 g/dL Final  . RDW 08/24/2015 16.1* 11.5 - 14.5 % Final  . Platelets 08/24/2015 131* 150 - 440 K/uL Final  . Neutrophils Relative % 08/24/2015 70  % Final  . Neutro Abs 08/24/2015 3.4  1.4 - 6.5 K/uL Final  . Lymphocytes Relative 08/24/2015 20  % Final  . Lymphs Abs 08/24/2015 0.9* 1.0 - 3.6 K/uL Final  . Monocytes Relative 08/24/2015 8  % Final  . Monocytes Absolute 08/24/2015 0.4  0.2 - 0.9 K/uL Final  . Eosinophils Relative 08/24/2015 1  % Final  . Eosinophils Absolute 08/24/2015 0.1  0 - 0.7 K/uL Final  . Basophils Relative 08/24/2015 1  % Final  . Basophils Absolute 08/24/2015 0.0  0 - 0.1 K/uL Final  . Sodium 08/24/2015 134* 135 - 145 mmol/L Final  . Potassium 08/24/2015 3.6  3.5 - 5.1 mmol/L Final  . Chloride 08/24/2015 101  101 - 111 mmol/L Final  . CO2 08/24/2015 25  22 - 32 mmol/L Final  . Glucose, Bld 08/24/2015 91  65 - 99 mg/dL Final  . BUN 08/24/2015 18  6 - 20 mg/dL Final  . Creatinine, Ser 08/24/2015 0.63  0.44 - 1.00 mg/dL Final  . Calcium 08/24/2015 9.0  8.9 - 10.3 mg/dL Final  . Total Protein 08/24/2015 8.2* 6.5 - 8.1 g/dL Final  . Albumin 08/24/2015 3.5  3.5 - 5.0 g/dL Final  . AST 08/24/2015 48* 15 - 41 U/L Final  . ALT 08/24/2015 26  14 - 54 U/L  Final  . Alkaline Phosphatase 08/24/2015 73  38 - 126 U/L Final  . Total Bilirubin 08/24/2015 0.8  0.3 - 1.2 mg/dL Final  . GFR calc non Af Amer 08/24/2015 >60  >60 mL/min Final  . GFR calc Af Amer 08/24/2015 >60  >60 mL/min Final   Comment: (NOTE) The eGFR has been calculated using the CKD EPI equation. This calculation has not been validated in all clinical situations. eGFR's persistently <60 mL/min signify possible Chronic Kidney Disease.   . Anion gap 08/24/2015 8  5 - 15 Final  . Magnesium 08/24/2015 1.9  1.7 - 2.4 mg/dL Final  Hospital Outpatient Visit on 08/23/2015  Component Date Value Ref Range Status  . Glucose-Capillary 08/23/2015 79  65 - 99 mg/dL Final    Assessment:  ROSAISELA JAMROZ is a 65 y.o. female with metastatic Her2/neu positive right breast cancer.  She initially presented in 03/2013 with an ulcerated breast mass and back pain.  Breast biopsy revealed lobular breast cancer.  Tumor was ER/PR positive and HER-2/neu positive. PET scan revealed bone metastasis. She received palliative radiation to T8 and T10 from 04/02-04/15/2015.  She declined chemotherapy. She initially began letrozole and Tykerb. Tykerb was discontinued after 1 dose. She received letrozole from 05/02/2013 until 01/07/2014 .  She received Herceptin from 06/02/2013 until 12/15/2013. She received fulvestrant monthly from 01/07/2014 until 05/27/2014.    She has received Xgeva monthly (03/31/2013 until 08/25/2014; last 08/03/2015).  She was switched to every 6 weeks Xgeva to coordinate with her treatment.  Herceptin was discontinued for a rising CA-27-29.  CA27.29 was 916.3 on 03/19/2013, 286.2 on 06/23/2013, 70.8 on 08/25/2013, 56.3 on 10/08/2013, 102.7 on 12/15/2013, 149.4 on 01/07/2014, 178.0 on 02/04/2014, 190.7 on 03/30/2014, 143.2 on 04/22/2014, 134.8 on 05/20/2014, 248.3 on 07/09/2014, 723.2 on 08/25/2014, 252.3 on 10/08/2014, 126 on 10/29/2014, 71.3 on 11/24/2014, 55 on 12/15/2014, 70.6 on 01/05/2015,  48.6 on 01/26/2015, 52.7 on 02/15/2015, 54.7 on 03/30/2015, 54 on 04/20/2015, 55.2 on 06/01/2015, 48.8  on 06/22/2015, and 49.7 08/03/2015.   She received Ibrance and Faslodex from 02/23/2014 until 07/27/2014.    Echo on 09/04/2014 revealed an EF of 55-65%.  Echo on 12/14/2014, 03/15/2015, and 06/15/2015 revealed an EF of 55-60%.   PET scan on 08/28/2014 revealed significant interval worsening and multifocal osseous metastatic disease. Lesions had increased in number and metabolic activity. There were no extra osseous metastasis.  She is s/p 16 cycles of Kadcyla (09/10/2014 - 08/03/2015).  She is tolerating treatment well.  She denies any bone pain.   PET scan on 02/08/2015 revealed a complete metabolic response in the sclerotic osseous metastases, with no residual hypermetabolic metastatic disease.  PET scan on 08/23/2015 revealed no new or progressive hypermetabolic metastatic disease.   Bone scan on 07/30/2015 revealed multifocal osseous metastases (sternum, thoracolumbar spine, multiple ribs, and right iliac crest).  The dominant left acetabular metastasis noted on prior PET was not evident. Some of the other sites of disease appear to have progressed.  There was no prior bone scan for comparison.  She was admitted from 12/22/2014 - 12/24/2014 with E coli sepsis secondary to a UTI.  She was treated with Augmentin.  Serum protein is elevated.  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  Symptomatically, she feels good.  Exam is unremarkable.  She has mild thrombocytopenia (platelets 131,000).  Plan: 1.  Labs today:  CBC with diff, CMP, CA27.29. 2.  Review PET scan.  Scans reveal stable disease with no evidence of progression. 3.  Cycle #17 Kadcyla.  4.  Anticipate follow-up echo after next cycle of Kadcyla. 5.  RTC in 3 weeks for MD assessment, labs (CBC with diff, CMP,  CA27.29), cycle #18 Kadcyla (TDM-1; trastuzumab emtansine) and Xgeva.   Lequita Asal, MD  08/24/2015,  9:23 AM

## 2015-08-23 ENCOUNTER — Encounter
Admission: RE | Admit: 2015-08-23 | Discharge: 2015-08-23 | Disposition: A | Discharge status: Home / Self Care | Payer: Medicare Other | Source: Ambulatory Visit | Attending: Hematology and Oncology | Admitting: Hematology and Oncology

## 2015-08-23 DIAGNOSIS — C7951 Secondary malignant neoplasm of bone: Secondary | ICD-10-CM | POA: Diagnosis not present

## 2015-08-23 DIAGNOSIS — C50911 Malignant neoplasm of unspecified site of right female breast: Secondary | ICD-10-CM | POA: Diagnosis not present

## 2015-08-23 LAB — GLUCOSE, CAPILLARY: Glucose-Capillary: 79 mg/dL (ref 65–99)

## 2015-08-23 MED ORDER — FLUDEOXYGLUCOSE F - 18 (FDG) INJECTION
12.7600 | Freq: Once | INTRAVENOUS | Status: AC
Start: 2015-08-23 — End: 2015-08-23
  Administered 2015-08-23: 12.76 via INTRAVENOUS

## 2015-08-24 ENCOUNTER — Encounter: Payer: Self-pay | Admitting: Hematology and Oncology

## 2015-08-24 ENCOUNTER — Inpatient Hospital Stay (HOSPITAL_BASED_OUTPATIENT_CLINIC_OR_DEPARTMENT_OTHER): Payer: Medicare Other | Admitting: Hematology and Oncology

## 2015-08-24 ENCOUNTER — Inpatient Hospital Stay: Payer: Medicare Other

## 2015-08-24 ENCOUNTER — Other Ambulatory Visit: Payer: Self-pay | Admitting: Hematology and Oncology

## 2015-08-24 VITALS — BP 169/90 | HR 80 | Temp 97.8°F | Resp 18 | Ht 63.0 in | Wt 116.8 lb

## 2015-08-24 DIAGNOSIS — Z79899 Other long term (current) drug therapy: Secondary | ICD-10-CM

## 2015-08-24 DIAGNOSIS — C50911 Malignant neoplasm of unspecified site of right female breast: Secondary | ICD-10-CM

## 2015-08-24 DIAGNOSIS — Z170 Estrogen receptor positive status [ER+]: Secondary | ICD-10-CM

## 2015-08-24 DIAGNOSIS — D696 Thrombocytopenia, unspecified: Secondary | ICD-10-CM

## 2015-08-24 DIAGNOSIS — C7951 Secondary malignant neoplasm of bone: Secondary | ICD-10-CM

## 2015-08-24 DIAGNOSIS — C801 Malignant (primary) neoplasm, unspecified: Secondary | ICD-10-CM

## 2015-08-24 DIAGNOSIS — Z5111 Encounter for antineoplastic chemotherapy: Secondary | ICD-10-CM

## 2015-08-24 DIAGNOSIS — D890 Polyclonal hypergammaglobulinemia: Secondary | ICD-10-CM | POA: Diagnosis not present

## 2015-08-24 DIAGNOSIS — C50919 Malignant neoplasm of unspecified site of unspecified female breast: Secondary | ICD-10-CM

## 2015-08-24 DIAGNOSIS — Z5112 Encounter for antineoplastic immunotherapy: Secondary | ICD-10-CM | POA: Diagnosis not present

## 2015-08-24 LAB — COMPREHENSIVE METABOLIC PANEL
ALT: 26 U/L (ref 14–54)
AST: 48 U/L — ABNORMAL HIGH (ref 15–41)
Albumin: 3.5 g/dL (ref 3.5–5.0)
Alkaline Phosphatase: 73 U/L (ref 38–126)
Anion gap: 8 (ref 5–15)
BUN: 18 mg/dL (ref 6–20)
CO2: 25 mmol/L (ref 22–32)
Calcium: 9 mg/dL (ref 8.9–10.3)
Chloride: 101 mmol/L (ref 101–111)
Creatinine, Ser: 0.63 mg/dL (ref 0.44–1.00)
GFR calc Af Amer: >60 mL/min (ref >60)
GFR calc non Af Amer: >60 mL/min (ref >60)
Glucose, Bld: 91 mg/dL (ref 65–99)
Potassium: 3.6 mmol/L (ref 3.5–5.1)
Sodium: 134 mmol/L — ABNORMAL LOW (ref 135–145)
Total Bilirubin: 0.8 mg/dL (ref 0.3–1.2)
Total Protein: 8.2 g/dL — ABNORMAL HIGH (ref 6.5–8.1)

## 2015-08-24 LAB — CBC WITH DIFFERENTIAL/PLATELET
Basophils Absolute: 0 10*3/uL (ref 0–0.1)
Basophils Relative: 1 %
Eosinophils Absolute: 0.1 10*3/uL (ref 0–0.7)
Eosinophils Relative: 1 %
HCT: 36.7 % (ref 35.0–47.0)
Hemoglobin: 12.9 g/dL (ref 12.0–16.0)
Lymphocytes Relative: 20 %
Lymphs Abs: 0.9 10*3/uL — ABNORMAL LOW (ref 1.0–3.6)
MCH: 33.1 pg (ref 26.0–34.0)
MCHC: 35.3 g/dL (ref 32.0–36.0)
MCV: 94 fL (ref 80.0–100.0)
Monocytes Absolute: 0.4 10*3/uL (ref 0.2–0.9)
Monocytes Relative: 8 %
Neutro Abs: 3.4 10*3/uL (ref 1.4–6.5)
Neutrophils Relative %: 70 %
Platelets: 131 10*3/uL — ABNORMAL LOW (ref 150–440)
RBC: 3.9 MIL/uL (ref 3.80–5.20)
RDW: 16.1 % — ABNORMAL HIGH (ref 11.5–14.5)
WBC: 4.8 10*3/uL (ref 3.6–11.0)

## 2015-08-24 LAB — MAGNESIUM: Magnesium: 1.9 mg/dL (ref 1.7–2.4)

## 2015-08-24 MED ORDER — ACETAMINOPHEN 325 MG PO TABS
650.0000 mg | ORAL_TABLET | Freq: Once | ORAL | Status: AC
  Administered 2015-08-24: 650 mg via ORAL
  Filled 2015-08-24: qty 2

## 2015-08-24 MED ORDER — DIPHENHYDRAMINE HCL 25 MG PO CAPS
50.0000 mg | ORAL_CAPSULE | Freq: Once | ORAL | Status: AC
  Administered 2015-08-24: 50 mg via ORAL
  Filled 2015-08-24: qty 2

## 2015-08-24 MED ORDER — HEPARIN SOD (PORK) LOCK FLUSH 100 UNIT/ML IV SOLN
500.0000 [IU] | Freq: Once | INTRAVENOUS | Status: AC
  Administered 2015-08-24: 500 [IU] via INTRAVENOUS
  Filled 2015-08-24: qty 5

## 2015-08-24 MED ORDER — OXYCODONE HCL 5 MG PO CAPS: ORAL_CAPSULE | ORAL | 0 refills | Status: DC

## 2015-08-24 MED ORDER — SODIUM CHLORIDE 0.9 % IV SOLN
Freq: Once | INTRAVENOUS | Status: AC
  Administered 2015-08-24: 10:00:00 via INTRAVENOUS
  Filled 2015-08-24: qty 1000

## 2015-08-24 MED ORDER — SODIUM CHLORIDE 0.9 % IJ SOLN
10.0000 mL | Freq: Once | INTRAMUSCULAR | Status: AC
  Administered 2015-08-24: 10 mL via INTRAVENOUS
  Filled 2015-08-24: qty 10

## 2015-08-24 MED ORDER — SODIUM CHLORIDE 0.9 % IV SOLN
3.6000 mg/kg | Freq: Once | INTRAVENOUS | Status: AC
  Administered 2015-08-24: 200 mg via INTRAVENOUS
  Filled 2015-08-24: qty 10

## 2015-08-24 NOTE — Progress Notes (Signed)
Pt b/p some elevated and she is nervous about the results of scan today. She is eating good, having reg. Bowel movements and no c/o today

## 2015-08-25 ENCOUNTER — Other Ambulatory Visit: Payer: Self-pay | Admitting: *Deleted

## 2015-08-25 DIAGNOSIS — C50911 Malignant neoplasm of unspecified site of right female breast: Secondary | ICD-10-CM

## 2015-08-25 DIAGNOSIS — C7951 Secondary malignant neoplasm of bone: Secondary | ICD-10-CM

## 2015-08-25 LAB — CANCER ANTIGEN 27.29: CA 27.29: 44.6 U/mL — ABNORMAL HIGH (ref 0.0–38.6)

## 2015-09-03 ENCOUNTER — Telehealth: Payer: Self-pay | Admitting: *Deleted

## 2015-09-03 NOTE — Telephone Encounter (Signed)
[place] has a new phone number [phone]. Demographics updated

## 2015-09-14 ENCOUNTER — Inpatient Hospital Stay: Payer: Medicare Other | Admitting: Hematology and Oncology

## 2015-09-14 ENCOUNTER — Inpatient Hospital Stay: Payer: Medicare Other

## 2015-09-16 ENCOUNTER — Encounter: Payer: Self-pay | Admitting: Hematology and Oncology

## 2015-09-16 ENCOUNTER — Inpatient Hospital Stay: Payer: Medicare Other

## 2015-09-16 ENCOUNTER — Inpatient Hospital Stay: Payer: Medicare Other | Attending: Hematology and Oncology | Admitting: Hematology and Oncology

## 2015-09-16 ENCOUNTER — Other Ambulatory Visit: Payer: Self-pay | Admitting: *Deleted

## 2015-09-16 ENCOUNTER — Other Ambulatory Visit: Payer: Self-pay | Admitting: Hematology and Oncology

## 2015-09-16 VITALS — BP 127/84 | HR 81 | Temp 96.8°F | Resp 18 | Wt 117.5 lb

## 2015-09-16 DIAGNOSIS — C50911 Malignant neoplasm of unspecified site of right female breast: Secondary | ICD-10-CM

## 2015-09-16 DIAGNOSIS — D890 Polyclonal hypergammaglobulinemia: Secondary | ICD-10-CM

## 2015-09-16 DIAGNOSIS — Z170 Estrogen receptor positive status [ER+]: Secondary | ICD-10-CM | POA: Diagnosis not present

## 2015-09-16 DIAGNOSIS — C7951 Secondary malignant neoplasm of bone: Secondary | ICD-10-CM

## 2015-09-16 DIAGNOSIS — Z79899 Other long term (current) drug therapy: Secondary | ICD-10-CM

## 2015-09-16 DIAGNOSIS — K589 Irritable bowel syndrome without diarrhea: Secondary | ICD-10-CM | POA: Diagnosis not present

## 2015-09-16 DIAGNOSIS — Z7189 Other specified counseling: Secondary | ICD-10-CM | POA: Insufficient documentation

## 2015-09-16 DIAGNOSIS — F17210 Nicotine dependence, cigarettes, uncomplicated: Secondary | ICD-10-CM | POA: Diagnosis not present

## 2015-09-16 DIAGNOSIS — D696 Thrombocytopenia, unspecified: Secondary | ICD-10-CM | POA: Diagnosis not present

## 2015-09-16 DIAGNOSIS — J449 Chronic obstructive pulmonary disease, unspecified: Secondary | ICD-10-CM | POA: Diagnosis not present

## 2015-09-16 DIAGNOSIS — C50919 Malignant neoplasm of unspecified site of unspecified female breast: Secondary | ICD-10-CM

## 2015-09-16 DIAGNOSIS — Z5112 Encounter for antineoplastic immunotherapy: Secondary | ICD-10-CM | POA: Insufficient documentation

## 2015-09-16 DIAGNOSIS — Z5111 Encounter for antineoplastic chemotherapy: Secondary | ICD-10-CM

## 2015-09-16 LAB — COMPREHENSIVE METABOLIC PANEL
ALT: 25 U/L (ref 14–54)
AST: 47 U/L — ABNORMAL HIGH (ref 15–41)
Albumin: 3.4 g/dL — ABNORMAL LOW (ref 3.5–5.0)
Alkaline Phosphatase: 69 U/L (ref 38–126)
Anion gap: 4 — ABNORMAL LOW (ref 5–15)
BUN: 13 mg/dL (ref 6–20)
CO2: 25 mmol/L (ref 22–32)
Calcium: 8.7 mg/dL — ABNORMAL LOW (ref 8.9–10.3)
Chloride: 105 mmol/L (ref 101–111)
Creatinine, Ser: 0.61 mg/dL (ref 0.44–1.00)
GFR calc Af Amer: >60 mL/min (ref >60)
GFR calc non Af Amer: >60 mL/min (ref >60)
Glucose, Bld: 89 mg/dL (ref 65–99)
Potassium: 3.6 mmol/L (ref 3.5–5.1)
Sodium: 134 mmol/L — ABNORMAL LOW (ref 135–145)
Total Bilirubin: 1 mg/dL (ref 0.3–1.2)
Total Protein: 8.4 g/dL — ABNORMAL HIGH (ref 6.5–8.1)

## 2015-09-16 LAB — CBC WITH DIFFERENTIAL/PLATELET
Basophils Absolute: 0.1 10*3/uL (ref 0–0.1)
Basophils Relative: 1 %
Eosinophils Absolute: 0.1 10*3/uL (ref 0–0.7)
Eosinophils Relative: 1 %
HCT: 37.2 % (ref 35.0–47.0)
Hemoglobin: 13.1 g/dL (ref 12.0–16.0)
Lymphocytes Relative: 19 %
Lymphs Abs: 0.9 10*3/uL — ABNORMAL LOW (ref 1.0–3.6)
MCH: 32.9 pg (ref 26.0–34.0)
MCHC: 35.2 g/dL (ref 32.0–36.0)
MCV: 93.4 fL (ref 80.0–100.0)
Monocytes Absolute: 0.4 10*3/uL (ref 0.2–0.9)
Monocytes Relative: 8 %
Neutro Abs: 3.4 10*3/uL (ref 1.4–6.5)
Neutrophils Relative %: 71 %
Platelets: 145 10*3/uL — ABNORMAL LOW (ref 150–440)
RBC: 3.98 MIL/uL (ref 3.80–5.20)
RDW: 16.4 % — ABNORMAL HIGH (ref 11.5–14.5)
WBC: 4.8 10*3/uL (ref 3.6–11.0)

## 2015-09-16 MED ORDER — SODIUM CHLORIDE 0.9 % IJ SOLN
10.0000 mL | INTRAMUSCULAR | Status: DC | PRN
  Filled 2015-09-16: qty 10

## 2015-09-16 MED ORDER — K PHOS MONO-SOD PHOS DI & MONO 155-852-130 MG PO TABS: 250.0000 mg | ORAL_TABLET | Freq: Two times a day (BID) | ORAL | 2 refills | Status: DC

## 2015-09-16 MED ORDER — DENOSUMAB 120 MG/1.7ML ~~LOC~~ SOLN
120.0000 mg | Freq: Once | SUBCUTANEOUS | Status: AC
  Administered 2015-09-16: 120 mg via SUBCUTANEOUS
  Filled 2015-09-16: qty 1.7

## 2015-09-16 MED ORDER — PANTOPRAZOLE SODIUM 40 MG PO TBEC: 40.0000 mg | DELAYED_RELEASE_TABLET | Freq: Every day | ORAL | 6 refills | Status: DC

## 2015-09-16 MED ORDER — SODIUM CHLORIDE 0.9 % IV SOLN
Freq: Once | INTRAVENOUS | Status: AC
  Administered 2015-09-16: 12:00:00 via INTRAVENOUS
  Filled 2015-09-16: qty 1000

## 2015-09-16 MED ORDER — HEPARIN SOD (PORK) LOCK FLUSH 100 UNIT/ML IV SOLN
500.0000 [IU] | Freq: Once | INTRAVENOUS | Status: AC | PRN
  Administered 2015-09-16: 500 [IU]
  Filled 2015-09-16: qty 5

## 2015-09-16 MED ORDER — POTASSIUM CHLORIDE ER 10 MEQ PO TBCR: 10.0000 meq | EXTENDED_RELEASE_TABLET | Freq: Every day | ORAL | 2 refills | Status: DC

## 2015-09-16 MED ORDER — DIPHENHYDRAMINE HCL 25 MG PO CAPS
50.0000 mg | ORAL_CAPSULE | Freq: Once | ORAL | Status: AC
  Administered 2015-09-16: 50 mg via ORAL

## 2015-09-16 MED ORDER — SODIUM CHLORIDE 0.9 % IV SOLN
3.6000 mg/kg | Freq: Once | INTRAVENOUS | Status: AC
  Administered 2015-09-16: 200 mg via INTRAVENOUS
  Filled 2015-09-16: qty 10

## 2015-09-16 NOTE — Progress Notes (Signed)
[facility] day:  09/16/15  Chief Complaint: Elizabeth Bond is a 65 y.o. female with metastatic Her2/neu positive right breast cancer who is seen for assessment prior to cycle #18 Kadcyla.  HPI:  The patient was last seen by me in the medical oncology clinic on 08/24/2015.  At that time, she was doing well.  At that time, PET scan revealed no new or progressive disease.  She received cycle #17 Kadcyla uneventfully.  Symptomatically, she denies any complaint.  She is eating well. She has gained a pound.  She denies any nausea or vomiting.  She is taking her calcium + vitamin D.   Past Medical History:  Diagnosis Date  . Breast cancer ([place])    right  . Cancer ([place]) 2015   breast and bone  . COPD (chronic obstructive pulmonary disease) ([place])   . IBS (irritable bowel syndrome)   . 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 ABDOMINAL HYSTERECTOMY  1978  . PORTACATH PLACEMENT  2015  . REPLACEMENT TOTAL KNEE Left 2004-2005?    Family History  Problem Relation Age of Onset  . Cancer Father     prostate  . Cancer Mother     Pancreatic  . Cancer Maternal Aunt     breast  . Cancer Cousin     breast  . Cancer Other     lung cancer - neice  . Cancer - Other Daughter     brain    Social History:  reports that she quit smoking about 2 years ago. Her smoking use included Cigarettes. She has a 10.00 pack-year smoking history. She has never used smokeless tobacco. She reports that she does not drink alcohol or use drugs.  The patient is alone today.  Allergies:  Allergies  Allergen Reactions  . Fentanyl Other (See Comments)    "loopy and confused"    Current Medications: Current Outpatient Prescriptions  Medication Sig Dispense Refill  . acetaminophen (TYLENOL) 500 MG tablet Take 1,000 mg by mouth every 8 (eight) hours as needed for headache.    . Calcium Carb-Cholecalciferol  (CALCIUM-VITAMIN D3) 600-400 MG-UNIT TABS Take 1 tablet by mouth 2 (two) times daily.    . clonazePAM (KLONOPIN) 0.5 MG tablet Take 1 tablet (0.5 mg total) by mouth 3 (three) times daily as needed for anxiety. 90 tablet 2  . furosemide (LASIX) 20 MG tablet TAKE ONE TABLET BY MOUTH EVERY OTHER DAYAS NEEDED FOR SWELLING 30 tablet 1  . loratadine (CLARITIN) 10 MG tablet Take 10 mg by mouth daily.    . Magnesium 250 MG TABS Take 1 each by mouth daily.     . ondansetron (ZOFRAN) 4 MG tablet Take 1 tablet (4 mg total) by mouth every 8 (eight) hours as needed for nausea or vomiting. 20 tablet 1  . oxycodone (OXY-IR) 5 MG capsule Take 1 tablet every 6 hours by mouth as needed for pain 60 capsule 0  . pantoprazole (PROTONIX) 40 MG tablet Take 1 tablet (40 mg total) by mouth daily. 30 tablet 6  . phosphorus (PHOSPHA 250 NEUTRAL) 155-852-130 MG tablet Take 1 tablet (250 mg total) by mouth 2 (two) times daily. 60 tablet 2  . potassium chloride (K-DUR) 10 MEQ tablet Take 1 tablet (10 mEq total) by mouth daily. 30 tablet 2   No current facility-administered medications for this visit.    Facility-Administered Medications Ordered in Other Visits  Medication Dose Route Frequency Provider Last Rate Last Dose  . heparin lock flush 100 unit/mL  500 Units Intracatheter Once PRN Lequita Asal, MD      . sodium chloride 0.9 % injection 10 mL  10 mL Intracatheter PRN Lequita Asal, MD        Review of Systems:  GENERAL:  Feels good.  No fevers or sweats.  Weight up 1 pound. PERFORMANCE STATUS (ECOG):  0 HEENT:  No visual changes, runny nose, sore throat, mouth sores or tenderness. Lungs: No shortness of breath.  Chronic cough.  No hemoptysis. Cardiac:  No chest pain, palpitations, orthopnea, or PND.  Blood pressure up as worried about scans GI:  Appetite good.  Eating well.  No nausea, vomiting, diarrhea, constipation, melena or hematochezia. GU:  No urgency, frequency, dysuria, or  hematuria. Musculoskeletal:  No back pain.  No joint pain.  No muscle tenderness. Extremities:  No pain or swelling. Skin:  No rashes or skin changes. Neuro:  Restless legs, resolved.  No headache, numbness or weakness, balance or coordination issues. Endocrine:  No diabetes, thyroid issues, hot flashes or night sweats. Psych:  No mood changes, depression or anxiety. Pain:  No focal pain.  Takes pain pill at night (no change). Review of systems:  All other systems reviewed and found to be negative.   Physical Exam: Blood pressure 127/84, pulse 81, temperature (!) 96.8 F (36 C), temperature source Tympanic, resp. rate 18, weight 117 lb 8.1 oz (53.3 kg). GENERAL:  Well developed, well nourished, woman sitting comfortably in the exam room in no acute distress. MENTAL STATUS:  Alert and oriented to person, place and time. HEAD:  Short styled gray hair.  Normocephalic, atraumatic, face symmetric, no Cushingoid features. EYES: Hazel eyes.  Pupils equal round and reactive to light and accomodation.  No conjunctivitis or scleral icterus. ENT:  Oropharynx clear without lesion.  Tongue normal. Mucous membranes moist.  RESPIRATORY:  Clear to auscultation without rales, wheezes or rhonchi. CARDIOVASCULAR:  Regular rate and rhythm without murmur, rub or gallop.  No JVD. ABDOMEN:  Soft, non-tender, with active bowel sounds, and no hepatosplenomegaly.  No masses. SKIN:  No rashes, ulcers or lesions. EXTREMITIES: No edema, no skin discoloration or tenderness.  No palpable cords. LYMPH NODES: No palpable cervical, supraclavicular, axillary or inguinal adenopathy  NEUROLOGICAL: Unremarkable. PSYCH:  Appropriate.    Infusion on 09/16/2015  Component Date Value Ref Range Status  . WBC 09/16/2015 4.8  3.6 - 11.0 K/uL Final  . RBC 09/16/2015 3.98  3.80 - 5.20 MIL/uL Final  . Hemoglobin 09/16/2015 13.1  12.0 - 16.0 g/dL Final  . HCT 09/16/2015 37.2  35.0 - 47.0 % Final  . MCV 09/16/2015 93.4  80.0 -  100.0 fL Final  . MCH 09/16/2015 32.9  26.0 - 34.0 pg Final  . MCHC 09/16/2015 35.2  32.0 - 36.0 g/dL Final  . RDW 09/16/2015 16.4* 11.5 - 14.5 % Final  . Platelets 09/16/2015 145* 150 - 440 K/uL Final  . Neutrophils Relative % 09/16/2015 71  % Final  . Neutro Abs 09/16/2015 3.4  1.4 - 6.5 K/uL Final  . Lymphocytes Relative 09/16/2015 19  % Final  . Lymphs Abs 09/16/2015 0.9* 1.0 - 3.6 K/uL Final  . Monocytes Relative 09/16/2015 8  % Final  . Monocytes Absolute 09/16/2015 0.4  0.2 - 0.9 K/uL Final  . Eosinophils Relative 09/16/2015 1  % Final  . Eosinophils Absolute 09/16/2015 0.1  0 - 0.7 K/uL Final  .  Basophils Relative 09/16/2015 1  % Final  . Basophils Absolute 09/16/2015 0.1  0 - 0.1 K/uL Final  . Sodium 09/16/2015 134* 135 - 145 mmol/L Final  . Potassium 09/16/2015 3.6  3.5 - 5.1 mmol/L Final  . Chloride 09/16/2015 105  101 - 111 mmol/L Final  . CO2 09/16/2015 25  22 - 32 mmol/L Final  . Glucose, Bld 09/16/2015 89  65 - 99 mg/dL Final  . BUN 09/16/2015 13  6 - 20 mg/dL Final  . Creatinine, Ser 09/16/2015 0.61  0.44 - 1.00 mg/dL Final  . Calcium 09/16/2015 8.7* 8.9 - 10.3 mg/dL Final  . Total Protein 09/16/2015 8.4* 6.5 - 8.1 g/dL Final  . Albumin 09/16/2015 3.4* 3.5 - 5.0 g/dL Final  . AST 09/16/2015 47* 15 - 41 U/L Final  . ALT 09/16/2015 25  14 - 54 U/L Final  . Alkaline Phosphatase 09/16/2015 69  38 - 126 U/L Final  . Total Bilirubin 09/16/2015 1.0  0.3 - 1.2 mg/dL Final  . GFR calc non Af Amer 09/16/2015 >60  >60 mL/min Final  . GFR calc Af Amer 09/16/2015 >60  >60 mL/min Final   Comment: (NOTE) The eGFR has been calculated using the CKD EPI equation. This calculation has not been validated in all clinical situations. eGFR's persistently <60 mL/min signify possible Chronic Kidney Disease.   . Anion gap 09/16/2015 4* 5 - 15 Final    Assessment:  Elizabeth Bond is a 65 y.o. female with metastatic Her2/neu positive right breast cancer.  She initially presented in 03/2013  with an ulcerated breast mass and back pain.  Breast biopsy revealed lobular breast cancer.  Tumor was ER/PR positive and HER-2/neu positive. PET scan revealed bone metastasis. She received palliative radiation to T8 and T10 from 04/02-04/15/2015.  She declined chemotherapy. She initially began letrozole and Tykerb. Tykerb was discontinued after 1 dose. She received letrozole from 05/02/2013 until 01/07/2014 .  She received Herceptin from 06/02/2013 until 12/15/2013. She received fulvestrant monthly from 01/07/2014 until 05/27/2014.    She has received Xgeva monthly (03/31/2013 until 08/25/2014; last 08/03/2015).  She was switched to every 6 weeks Xgeva to coordinate with her treatment.  Herceptin was discontinued for a rising CA-27-29.  CA27.29 was 916.3 on 03/19/2013, 286.2 on 06/23/2013, 70.8 on 08/25/2013, 56.3 on 10/08/2013, 102.7 on 12/15/2013, 149.4 on 01/07/2014, 178.0 on 02/04/2014, 190.7 on 03/30/2014, 143.2 on 04/22/2014, 134.8 on 05/20/2014, 248.3 on 07/09/2014, 723.2 on 08/25/2014, 252.3 on 10/08/2014, 126 on 10/29/2014, 71.3 on 11/24/2014, 55 on 12/15/2014, 70.6 on 01/05/2015, 48.6 on 01/26/2015, 52.7 on 02/15/2015, 54.7 on 03/30/2015, 54 on 04/20/2015, 55.2 on 06/01/2015, 48.8 on 06/22/2015, and 49.7 08/03/2015.   She received Ibrance and Faslodex from 02/23/2014 until 07/27/2014.    Echo on 09/04/2014 revealed an EF of 55-65%.  Echo on 12/14/2014, 03/15/2015, and 06/15/2015 revealed an EF of 55-60%.   PET scan on 08/28/2014 revealed significant interval worsening and multifocal osseous metastatic disease. Lesions had increased in number and metabolic activity. There were no extra osseous metastasis.  She is s/p 17 cycles of Kadcyla (09/10/2014 - 08/24/2015).  She is tolerating treatment well.  She denies any bone pain.   PET scan on 02/08/2015 revealed a complete metabolic response in the sclerotic osseous metastases, with no residual hypermetabolic metastatic disease.  PET scan on  08/23/2015 revealed no new or progressive hypermetabolic metastatic disease.   Bone scan on 07/30/2015 revealed multifocal osseous metastases (sternum, thoracolumbar spine, multiple ribs, and right iliac crest).  The  dominant left acetabular metastasis noted on prior PET was not evident. Some of the other sites of disease appear to have progressed.  There was no prior bone scan for comparison.  She was admitted from 12/22/2014 - 12/24/2014 with E coli sepsis secondary to a UTI.  Serum protein is elevated.  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  Symptomatically, she feels good.  Exam is unremarkable.  She has mild thrombocytopenia (platelets 145,000).  Corrected calcium is 9.18.  Plan: 1.  Labs today:  CBC with diff, CMP, CA27.29. 2.  Cycle #18 Kadcyla.  3.  Xgeva today. 4.  Discuss calcium supplementation (calcium rich foods, Tums) + continuation of calcium-vitamin D tablets BID.   5.  Schedule echo. 6.  Refill:  Protonix, phosphorus, potassium chloride. 7.  RTC in 3 weeks for MD assessment, labs (CBC with diff, CMP, Mg, Phos, CA27.29), review of echo, and cycle #19 Kadcyla (T-DM1; trastuzumab emtansine).   Lequita Asal, MD  09/16/2015, 10:50 AM

## 2015-09-16 NOTE — Progress Notes (Signed)
Patient states she has exertional SOB.  When she does something she has to rest before finishing.  Requesting refills for K+, Pantoprazole and Phospha Sodium.

## 2015-09-17 LAB — CANCER ANTIGEN 27.29: CA 27.29: 44.5 U/mL — ABNORMAL HIGH (ref 0.0–38.6)

## 2015-09-23 ENCOUNTER — Ambulatory Visit
Admission: RE | Admit: 2015-09-23 | Discharge: 2015-09-23 | Disposition: A | Discharge status: Home / Self Care | Payer: Medicare Other | Source: Ambulatory Visit | Attending: Hematology and Oncology | Admitting: Hematology and Oncology

## 2015-09-23 ENCOUNTER — Telehealth: Payer: Self-pay | Admitting: *Deleted

## 2015-09-23 DIAGNOSIS — I272 Other secondary pulmonary hypertension: Secondary | ICD-10-CM | POA: Insufficient documentation

## 2015-09-23 DIAGNOSIS — Z5111 Encounter for antineoplastic chemotherapy: Secondary | ICD-10-CM

## 2015-09-23 DIAGNOSIS — I340 Nonrheumatic mitral (valve) insufficiency: Secondary | ICD-10-CM | POA: Diagnosis not present

## 2015-09-23 DIAGNOSIS — C50911 Malignant neoplasm of unspecified site of right female breast: Secondary | ICD-10-CM

## 2015-09-23 DIAGNOSIS — I351 Nonrheumatic aortic (valve) insufficiency: Secondary | ICD-10-CM | POA: Diagnosis not present

## 2015-09-23 DIAGNOSIS — C7951 Secondary malignant neoplasm of bone: Secondary | ICD-10-CM

## 2015-09-23 MED ORDER — OXYCODONE HCL 5 MG PO CAPS
ORAL_CAPSULE | ORAL | 0 refills | Status: DC
Start: 2015-09-23 — End: 2015-10-28

## 2015-09-23 NOTE — Telephone Encounter (Signed)
Pt here in clinic for refill of oxycodone and it was filled. She also wanted refill of clonazepam but she has 2 refills left on it.

## 2015-09-23 NOTE — Progress Notes (Signed)
*  PRELIMINARY RESULTS* Echocardiogram 2D Echocardiogram has been performed.  Elizabeth Bond 09/23/2015, 10:10 AM

## 2015-10-07 ENCOUNTER — Inpatient Hospital Stay: Payer: Medicare Other

## 2015-10-07 ENCOUNTER — Encounter: Payer: Self-pay | Admitting: Hematology and Oncology

## 2015-10-07 ENCOUNTER — Other Ambulatory Visit: Payer: Self-pay | Admitting: *Deleted

## 2015-10-07 ENCOUNTER — Inpatient Hospital Stay: Payer: Medicare Other | Attending: Hematology and Oncology | Admitting: Hematology and Oncology

## 2015-10-07 ENCOUNTER — Other Ambulatory Visit: Payer: Self-pay | Admitting: Hematology and Oncology

## 2015-10-07 VITALS — BP 159/87 | HR 79 | Temp 96.1°F | Wt 115.3 lb

## 2015-10-07 DIAGNOSIS — K589 Irritable bowel syndrome without diarrhea: Secondary | ICD-10-CM | POA: Diagnosis not present

## 2015-10-07 DIAGNOSIS — Z794 Long term (current) use of insulin: Secondary | ICD-10-CM | POA: Insufficient documentation

## 2015-10-07 DIAGNOSIS — C50911 Malignant neoplasm of unspecified site of right female breast: Secondary | ICD-10-CM

## 2015-10-07 DIAGNOSIS — E876 Hypokalemia: Secondary | ICD-10-CM | POA: Insufficient documentation

## 2015-10-07 DIAGNOSIS — Z170 Estrogen receptor positive status [ER+]: Secondary | ICD-10-CM

## 2015-10-07 DIAGNOSIS — Z79899 Other long term (current) drug therapy: Secondary | ICD-10-CM | POA: Diagnosis not present

## 2015-10-07 DIAGNOSIS — C7951 Secondary malignant neoplasm of bone: Secondary | ICD-10-CM

## 2015-10-07 DIAGNOSIS — Z923 Personal history of irradiation: Secondary | ICD-10-CM

## 2015-10-07 DIAGNOSIS — F17210 Nicotine dependence, cigarettes, uncomplicated: Secondary | ICD-10-CM

## 2015-10-07 DIAGNOSIS — D890 Polyclonal hypergammaglobulinemia: Secondary | ICD-10-CM

## 2015-10-07 DIAGNOSIS — C50919 Malignant neoplasm of unspecified site of unspecified female breast: Secondary | ICD-10-CM

## 2015-10-07 DIAGNOSIS — Z5111 Encounter for antineoplastic chemotherapy: Secondary | ICD-10-CM

## 2015-10-07 DIAGNOSIS — D696 Thrombocytopenia, unspecified: Secondary | ICD-10-CM

## 2015-10-07 DIAGNOSIS — J449 Chronic obstructive pulmonary disease, unspecified: Secondary | ICD-10-CM | POA: Insufficient documentation

## 2015-10-07 DIAGNOSIS — Z5112 Encounter for antineoplastic immunotherapy: Secondary | ICD-10-CM | POA: Insufficient documentation

## 2015-10-07 LAB — CBC WITH DIFFERENTIAL/PLATELET
Basophils Absolute: 0 10*3/uL (ref 0–0.1)
Basophils Relative: 1 %
Eosinophils Absolute: 0.1 10*3/uL (ref 0–0.7)
Eosinophils Relative: 2 %
HCT: 37.2 % (ref 35.0–47.0)
Hemoglobin: 13.1 g/dL (ref 12.0–16.0)
Lymphocytes Relative: 16 %
Lymphs Abs: 0.7 10*3/uL — ABNORMAL LOW (ref 1.0–3.6)
MCH: 33.3 pg (ref 26.0–34.0)
MCHC: 35.1 g/dL (ref 32.0–36.0)
MCV: 94.8 fL (ref 80.0–100.0)
Monocytes Absolute: 0.3 10*3/uL (ref 0.2–0.9)
Monocytes Relative: 8 %
Neutro Abs: 3.2 10*3/uL (ref 1.4–6.5)
Neutrophils Relative %: 73 %
Platelets: 127 10*3/uL — ABNORMAL LOW (ref 150–440)
RBC: 3.92 MIL/uL (ref 3.80–5.20)
RDW: 16.3 % — ABNORMAL HIGH (ref 11.5–14.5)
WBC: 4.4 10*3/uL (ref 3.6–11.0)

## 2015-10-07 LAB — COMPREHENSIVE METABOLIC PANEL
ALT: 25 U/L (ref 14–54)
AST: 49 U/L — ABNORMAL HIGH (ref 15–41)
Albumin: 3.5 g/dL (ref 3.5–5.0)
Alkaline Phosphatase: 73 U/L (ref 38–126)
Anion gap: 5 (ref 5–15)
BUN: 12 mg/dL (ref 6–20)
CO2: 26 mmol/L (ref 22–32)
Calcium: 9.1 mg/dL (ref 8.9–10.3)
Chloride: 105 mmol/L (ref 101–111)
Creatinine, Ser: 0.63 mg/dL (ref 0.44–1.00)
GFR calc Af Amer: >60 mL/min (ref >60)
GFR calc non Af Amer: >60 mL/min (ref >60)
Glucose, Bld: 91 mg/dL (ref 65–99)
Potassium: 3.3 mmol/L — ABNORMAL LOW (ref 3.5–5.1)
Sodium: 136 mmol/L (ref 135–145)
Total Bilirubin: 1.2 mg/dL (ref 0.3–1.2)
Total Protein: 8.1 g/dL (ref 6.5–8.1)

## 2015-10-07 LAB — MAGNESIUM: Magnesium: 1.8 mg/dL (ref 1.7–2.4)

## 2015-10-07 LAB — PHOSPHORUS: Phosphorus: 3.7 mg/dL (ref 2.5–4.6)

## 2015-10-07 MED ORDER — SODIUM CHLORIDE 0.9 % IV SOLN
3.6000 mg/kg | Freq: Once | INTRAVENOUS | Status: AC
  Administered 2015-10-07: 200 mg via INTRAVENOUS
  Filled 2015-10-07: qty 10

## 2015-10-07 MED ORDER — DIPHENHYDRAMINE HCL 25 MG PO CAPS
50.0000 mg | ORAL_CAPSULE | Freq: Once | ORAL | Status: AC
  Administered 2015-10-07: 50 mg via ORAL
  Filled 2015-10-07: qty 2

## 2015-10-07 MED ORDER — SODIUM CHLORIDE 0.9 % IV SOLN
Freq: Once | INTRAVENOUS | Status: AC
  Administered 2015-10-07: 10:00:00 via INTRAVENOUS
  Filled 2015-10-07: qty 1000

## 2015-10-07 MED ORDER — SODIUM CHLORIDE 0.9 % IJ SOLN
10.0000 mL | INTRAMUSCULAR | Status: DC | PRN
  Administered 2015-10-07: 10 mL
  Filled 2015-10-07: qty 10

## 2015-10-07 MED ORDER — ACETAMINOPHEN 325 MG PO TABS
650.0000 mg | ORAL_TABLET | Freq: Once | ORAL | Status: AC
  Administered 2015-10-07: 650 mg via ORAL
  Filled 2015-10-07: qty 2

## 2015-10-07 MED ORDER — HEPARIN SOD (PORK) LOCK FLUSH 100 UNIT/ML IV SOLN
500.0000 [IU] | Freq: Once | INTRAVENOUS | Status: AC | PRN
  Administered 2015-10-07: 500 [IU]
  Filled 2015-10-07: qty 5

## 2015-10-07 NOTE — Progress Notes (Signed)
Patient's BP elevated 158/87.  HR 79. Patient states she is anxious today about her numbers, etc. Does not take anything for BP.

## 2015-10-07 NOTE — Progress Notes (Signed)
[facility] day:  10/07/15  Chief Complaint: Elizabeth Bond is a 65 y.o. female with metastatic Her2/neu positive right breast cancer who is seen for assessment prior to cycle #19 Kadcyla.  HPI:  The patient was last seen by me in the medical oncology clinic on 09/16/2015.  At that time, she felt good.  Exam was unremarkable.  She had mild thrombocytopenia (platelets 145,000).  She was taking her calcium and vitamin D. Corrected calcium was 9.18.  She received Kadcyla and Xgeva.  Echo on 09/23/2015 revealed an EF of 50%.  Symptomatically, she denies any new complaints.  She feels good.  She denies any shortness of breath, PND, or orthopnea.  She is on potassium 10 meq a day and calcium 3 times a day.   Past Medical History:  Diagnosis Date  . Breast cancer ([place])    right  . Cancer ([place]) 2015   breast and bone  . COPD (chronic obstructive pulmonary disease) ([place])   . IBS (irritable bowel syndrome)   . Metastasis to bone ([place]) 09/01/2014    Past Surgical History:  Procedure Laterality Date  . ABDOMINAL HYSTERECTOMY  1978  . PORTACATH PLACEMENT  2015  . REPLACEMENT TOTAL KNEE Left 2004-2005?    Family History  Problem Relation Age of Onset  . Cancer Father     prostate  . Cancer Mother     Pancreatic  . Cancer Maternal Aunt     breast  . Cancer Cousin     breast  . Cancer Other     lung cancer - neice  . Cancer - Other Daughter     brain    Social History:  reports that she quit smoking about 2 years ago. Her smoking use included Cigarettes. She has a 10.00 pack-year smoking history. She has never used smokeless tobacco. She reports that she does not drink alcohol or use drugs.  The patient is alone today.  Allergies:  Allergies  Allergen Reactions  . Fentanyl Other (See Comments)    "loopy and confused"    Current Medications: Current Outpatient Prescriptions  Medication Sig Dispense Refill  . acetaminophen (TYLENOL)  500 MG tablet Take 1,000 mg by mouth every 8 (eight) hours as needed for headache.    . Calcium Carb-Cholecalciferol (CALCIUM-VITAMIN D3) 600-400 MG-UNIT TABS Take 1 tablet by mouth 2 (two) times daily.    . clonazePAM (KLONOPIN) 0.5 MG tablet Take 1 tablet (0.5 mg total) by mouth 3 (three) times daily as needed for anxiety. 90 tablet 2  . furosemide (LASIX) 20 MG tablet TAKE ONE TABLET BY MOUTH EVERY OTHER DAYAS NEEDED FOR SWELLING 30 tablet 1  . loratadine (CLARITIN) 10 MG tablet Take 10 mg by mouth daily.    . Magnesium 250 MG TABS Take 1 each by mouth daily.     . ondansetron (ZOFRAN) 4 MG tablet Take 1 tablet (4 mg total) by mouth every 8 (eight) hours as needed for nausea or vomiting. 20 tablet 1  . oxycodone (OXY-IR) 5 MG capsule Take 1 tablet every 6 hours by mouth as needed for pain 60 capsule 0  . pantoprazole (PROTONIX) 40 MG tablet Take 1 tablet (40 mg total) by mouth daily. 30 tablet 6  . phosphorus (PHOSPHA 250 NEUTRAL) 155-852-130 MG tablet Take 1 tablet (250 mg total) by mouth 2 (two) times daily. 60 tablet 2  . potassium chloride (K-DUR) 10 MEQ tablet Take 1 tablet (10 mEq total) by mouth  daily. 30 tablet 2   No current facility-administered medications for this visit.     Review of Systems:  GENERAL:  Feels good.  No fevers or sweats.  Weight down 2 pounds. PERFORMANCE STATUS (ECOG):  0 HEENT:  No visual changes, runny nose, sore throat, mouth sores or tenderness. Lungs: No shortness of breath.  Chronic cough.  No hemoptysis. Cardiac:  No chest pain, palpitations, orthopnea, or PND.  Blood pressure up as worried about scans GI:  Eating well.  No nausea, vomiting, diarrhea, constipation, melena or hematochezia. GU:  No urgency, frequency, dysuria, or hematuria. Musculoskeletal:  No back pain.  No joint pain.  No muscle tenderness. Extremities:  No pain or swelling. Skin:  No rashes or skin changes. Neuro:  No headache, numbness or weakness, balance or coordination  issues. Endocrine:  No diabetes, thyroid issues, hot flashes or night sweats. Psych:  No mood changes, depression or anxiety. Pain:  No focal pain.  Takes pain pill at night (no change). Review of systems:  All other systems reviewed and found to be negative.   Physical Exam: Blood pressure (!) 159/87, pulse 79, temperature (!) 96.1 F (35.6 C), temperature source Tympanic, weight 115 lb 4.8 oz (52.3 kg). GENERAL:  Well developed, well nourished, woman sitting comfortably in the exam room in no acute distress. MENTAL STATUS:  Alert and oriented to person, place and time. HEAD:  Short styled gray hair.  Normocephalic, atraumatic, face symmetric, no Cushingoid features. EYES: Hazel eyes.  Pupils equal round and reactive to light and accomodation.  No conjunctivitis or scleral icterus. ENT:  Oropharynx clear without lesion.  Tongue normal. Mucous membranes moist.  RESPIRATORY:  Clear to auscultation without rales, wheezes or rhonchi. CARDIOVASCULAR:  Regular rate and rhythm without murmur, rub or gallop.  No JVD. ABDOMEN:  Soft, non-tender, with active bowel sounds, and no hepatosplenomegaly.  No masses. SKIN:  No rashes, ulcers or lesions. EXTREMITIES: No edema, no skin discoloration or tenderness.  No palpable cords. LYMPH NODES: No palpable cervical, supraclavicular, axillary or inguinal adenopathy  NEUROLOGICAL: Unremarkable. PSYCH:  Appropriate.    Infusion on 10/07/2015  Component Date Value Ref Range Status  . WBC 10/07/2015 4.4  3.6 - 11.0 K/uL Final  . RBC 10/07/2015 3.92  3.80 - 5.20 MIL/uL Final  . Hemoglobin 10/07/2015 13.1  12.0 - 16.0 g/dL Final  . HCT 10/07/2015 37.2  35.0 - 47.0 % Final  . MCV 10/07/2015 94.8  80.0 - 100.0 fL Final  . MCH 10/07/2015 33.3  26.0 - 34.0 pg Final  . MCHC 10/07/2015 35.1  32.0 - 36.0 g/dL Final  . RDW 10/07/2015 16.3* 11.5 - 14.5 % Final  . Platelets 10/07/2015 127* 150 - 440 K/uL Final  . Neutrophils Relative % 10/07/2015 73  % Final  .  Neutro Abs 10/07/2015 3.2  1.4 - 6.5 K/uL Final  . Lymphocytes Relative 10/07/2015 16  % Final  . Lymphs Abs 10/07/2015 0.7* 1.0 - 3.6 K/uL Final  . Monocytes Relative 10/07/2015 8  % Final  . Monocytes Absolute 10/07/2015 0.3  0.2 - 0.9 K/uL Final  . Eosinophils Relative 10/07/2015 2  % Final  . Eosinophils Absolute 10/07/2015 0.1  0 - 0.7 K/uL Final  . Basophils Relative 10/07/2015 1  % Final  . Basophils Absolute 10/07/2015 0.0  0 - 0.1 K/uL Final  . Sodium 10/07/2015 136  135 - 145 mmol/L Final  . Potassium 10/07/2015 3.3* 3.5 - 5.1 mmol/L Final  . Chloride 10/07/2015 105  101 - 111 mmol/L Final  . CO2 10/07/2015 26  22 - 32 mmol/L Final  . Glucose, Bld 10/07/2015 91  65 - 99 mg/dL Final  . BUN 10/07/2015 12  6 - 20 mg/dL Final  . Creatinine, Ser 10/07/2015 0.63  0.44 - 1.00 mg/dL Final  . Calcium 10/07/2015 9.1  8.9 - 10.3 mg/dL Final  . Total Protein 10/07/2015 8.1  6.5 - 8.1 g/dL Final  . Albumin 10/07/2015 3.5  3.5 - 5.0 g/dL Final  . AST 10/07/2015 49* 15 - 41 U/L Final  . ALT 10/07/2015 25  14 - 54 U/L Final  . Alkaline Phosphatase 10/07/2015 73  38 - 126 U/L Final  . Total Bilirubin 10/07/2015 1.2  0.3 - 1.2 mg/dL Final  . GFR calc non Af Amer 10/07/2015 >60  >60 mL/min Final  . GFR calc Af Amer 10/07/2015 >60  >60 mL/min Final   Comment: (NOTE) The eGFR has been calculated using the CKD EPI equation. This calculation has not been validated in all clinical situations. eGFR's persistently <60 mL/min signify possible Chronic Kidney Disease.   . Anion gap 10/07/2015 5  5 - 15 Final  . Magnesium 10/07/2015 1.8  1.7 - 2.4 mg/dL Final    Assessment:  RAEYA MERRITTS is a 65 y.o. female with metastatic Her2/neu positive right breast cancer.  She initially presented in 03/2013 with an ulcerated breast mass and back pain.  Breast biopsy revealed lobular breast cancer.  Tumor was ER/PR positive and HER-2/neu positive. PET scan revealed bone metastasis. She received palliative  radiation to T8 and T10 from 04/02-04/15/2015.  She declined chemotherapy. She initially began letrozole and Tykerb. Tykerb was discontinued after 1 dose. She received letrozole from 05/02/2013 until 01/07/2014 .  She received Herceptin from 06/02/2013 until 12/15/2013. She received fulvestrant monthly from 01/07/2014 until 05/27/2014.    She has received Xgeva monthly (03/31/2013 until 08/25/2014; last 09/16/2015).  She was switched to every 6 weeks Xgeva to coordinate with her treatment.  Herceptin was discontinued for a rising CA-27-29.  CA27.29 was 916.3 on 03/19/2013, 286.2 on 06/23/2013, 70.8 on 08/25/2013, 56.3 on 10/08/2013, 102.7 on 12/15/2013, 149.4 on 01/07/2014, 178.0 on 02/04/2014, 190.7 on 03/30/2014, 143.2 on 04/22/2014, 134.8 on 05/20/2014, 248.3 on 07/09/2014, 723.2 on 08/25/2014, 252.3 on 10/08/2014, 126 on 10/29/2014, 71.3 on 11/24/2014, 55 on 12/15/2014, 70.6 on 01/05/2015, 48.6 on 01/26/2015, 52.7 on 02/15/2015, 54.7 on 03/30/2015, 54 on 04/20/2015, 55.2 on 06/01/2015, 48.8 on 06/22/2015, 49.7 08/03/2015, 44.6 on 08/24/2015, and 44.5 on 09/16/2015.   She received Ibrance and Faslodex from 02/23/2014 until 07/27/2014.    Echo on 09/04/2014 revealed an EF of 55-65%.  Echo on 12/14/2014, 03/15/2015, 06/15/2015 revealed an EF of 55-60%, and 50% on 09/23/2015.   PET scan on 08/28/2014 revealed significant interval worsening and multifocal osseous metastatic disease. Lesions had increased in number and metabolic activity. There were no extra osseous metastasis.  She is s/p 18 cycles of Kadcyla (09/10/2014 - 09/16/2015).  She is tolerating treatment well.  She denies any bone pain.   PET scan on 02/08/2015 revealed a complete metabolic response in the sclerotic osseous metastases, with no residual hypermetabolic metastatic disease.  PET scan on 08/23/2015 revealed no new or progressive hypermetabolic metastatic disease.   Bone scan on 07/30/2015 revealed multifocal osseous metastases  (sternum, thoracolumbar spine, multiple ribs, and right iliac crest).  The dominant left acetabular metastasis noted on prior PET was not evident. Some of the other sites of disease appear to have progressed.  There was no prior bone scan for comparison.  She was admitted from 12/22/2014 - 12/24/2014 with E coli sepsis secondary to a UTI.  Serum protein is elevated.  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  Symptomatically, she feels good.  Exam is unremarkable.  She has mild thrombocytopenia (platelets 127,000).   She has hypokalemia (3.3) on potassium 10 meq a day.  Calcium is normal on calcium TID.  Plan: 1.  Labs today:  CBC with diff, CMP, Phos, CA27.29. 2.  Review echo.  Mild decrease in EF. EF 50%.  Discuss signs and symptoms of CHF.  Discuss plan for close monitoring (check in 6 weeks instead of 12 weeks)  and holding Kadcycla if further drop in EF.  Patient agrees with plan. 3.  Cycle #19 Kadcyla.  4.  Potassium chloride 10 meq BID x 3-4 days then back to 10 meq a day. 5.  RTC in 3 weeks for MD assessment, labs (CBC with diff, CMP, Mg, CA27.29), Xgeva, and cycle #20 Kadcyla (T-DM1)   Lequita Asal, MD  10/07/2015, 9:03 AM

## 2015-10-08 LAB — CANCER ANTIGEN 27.29: CA 27.29: 44.8 U/mL — ABNORMAL HIGH (ref 0.0–38.6)

## 2015-10-28 ENCOUNTER — Inpatient Hospital Stay: Payer: Medicare Other

## 2015-10-28 ENCOUNTER — Inpatient Hospital Stay (HOSPITAL_BASED_OUTPATIENT_CLINIC_OR_DEPARTMENT_OTHER): Payer: Medicare Other | Admitting: Hematology and Oncology

## 2015-10-28 ENCOUNTER — Other Ambulatory Visit: Payer: Self-pay | Admitting: Hematology and Oncology

## 2015-10-28 VITALS — BP 157/92 | HR 75 | Resp 18

## 2015-10-28 VITALS — BP 153/95 | HR 80 | Temp 96.5°F | Resp 18 | Wt 113.1 lb

## 2015-10-28 DIAGNOSIS — D696 Thrombocytopenia, unspecified: Secondary | ICD-10-CM

## 2015-10-28 DIAGNOSIS — C7951 Secondary malignant neoplasm of bone: Secondary | ICD-10-CM

## 2015-10-28 DIAGNOSIS — G4700 Insomnia, unspecified: Secondary | ICD-10-CM

## 2015-10-28 DIAGNOSIS — D890 Polyclonal hypergammaglobulinemia: Secondary | ICD-10-CM

## 2015-10-28 DIAGNOSIS — C50911 Malignant neoplasm of unspecified site of right female breast: Secondary | ICD-10-CM

## 2015-10-28 DIAGNOSIS — Z5111 Encounter for antineoplastic chemotherapy: Secondary | ICD-10-CM

## 2015-10-28 DIAGNOSIS — Z923 Personal history of irradiation: Secondary | ICD-10-CM

## 2015-10-28 DIAGNOSIS — Z170 Estrogen receptor positive status [ER+]: Secondary | ICD-10-CM

## 2015-10-28 DIAGNOSIS — C50919 Malignant neoplasm of unspecified site of unspecified female breast: Secondary | ICD-10-CM

## 2015-10-28 DIAGNOSIS — Z79899 Other long term (current) drug therapy: Secondary | ICD-10-CM

## 2015-10-28 DIAGNOSIS — F17210 Nicotine dependence, cigarettes, uncomplicated: Secondary | ICD-10-CM

## 2015-10-28 DIAGNOSIS — E876 Hypokalemia: Secondary | ICD-10-CM | POA: Diagnosis not present

## 2015-10-28 DIAGNOSIS — R17 Unspecified jaundice: Secondary | ICD-10-CM

## 2015-10-28 DIAGNOSIS — Z5112 Encounter for antineoplastic immunotherapy: Secondary | ICD-10-CM | POA: Diagnosis not present

## 2015-10-28 LAB — CBC WITH DIFFERENTIAL/PLATELET
Basophils Absolute: 0 10*3/uL (ref 0–0.1)
Basophils Relative: 0 %
Eosinophils Absolute: 0.1 10*3/uL (ref 0–0.7)
Eosinophils Relative: 1 %
HCT: 38.6 % (ref 35.0–47.0)
Hemoglobin: 13.7 g/dL (ref 12.0–16.0)
Lymphocytes Relative: 13 %
Lymphs Abs: 0.8 10*3/uL — ABNORMAL LOW (ref 1.0–3.6)
MCH: 33.5 pg (ref 26.0–34.0)
MCHC: 35.6 g/dL (ref 32.0–36.0)
MCV: 94.2 fL (ref 80.0–100.0)
Monocytes Absolute: 0.4 10*3/uL (ref 0.2–0.9)
Monocytes Relative: 7 %
Neutro Abs: 4.7 10*3/uL (ref 1.4–6.5)
Neutrophils Relative %: 79 %
Platelets: 153 10*3/uL (ref 150–440)
RBC: 4.1 MIL/uL (ref 3.80–5.20)
RDW: 16.3 % — ABNORMAL HIGH (ref 11.5–14.5)
WBC: 6.1 10*3/uL (ref 3.6–11.0)

## 2015-10-28 LAB — COMPREHENSIVE METABOLIC PANEL
ALT: 29 U/L (ref 14–54)
AST: 53 U/L — ABNORMAL HIGH (ref 15–41)
Albumin: 3.9 g/dL (ref 3.5–5.0)
Alkaline Phosphatase: 79 U/L (ref 38–126)
Anion gap: 8 (ref 5–15)
BUN: 13 mg/dL (ref 6–20)
CO2: 25 mmol/L (ref 22–32)
Calcium: 9.2 mg/dL (ref 8.9–10.3)
Chloride: 102 mmol/L (ref 101–111)
Creatinine, Ser: 0.58 mg/dL (ref 0.44–1.00)
GFR calc Af Amer: >60 mL/min (ref >60)
GFR calc non Af Amer: >60 mL/min (ref >60)
Glucose, Bld: 88 mg/dL (ref 65–99)
Potassium: 3.7 mmol/L (ref 3.5–5.1)
Sodium: 135 mmol/L (ref 135–145)
Total Bilirubin: 1.3 mg/dL — ABNORMAL HIGH (ref 0.3–1.2)
Total Protein: 8.8 g/dL — ABNORMAL HIGH (ref 6.5–8.1)

## 2015-10-28 LAB — BILIRUBIN, DIRECT: Bilirubin, Direct: 0.2 mg/dL (ref 0.1–0.5)

## 2015-10-28 LAB — MAGNESIUM: Magnesium: 1.9 mg/dL (ref 1.7–2.4)

## 2015-10-28 MED ORDER — HEPARIN SOD (PORK) LOCK FLUSH 100 UNIT/ML IV SOLN
500.0000 [IU] | Freq: Once | INTRAVENOUS | Status: AC | PRN
  Administered 2015-10-28: 500 [IU]
  Filled 2015-10-28: qty 5

## 2015-10-28 MED ORDER — OXYCODONE HCL 5 MG PO CAPS: ORAL_CAPSULE | ORAL | 0 refills | Status: DC

## 2015-10-28 MED ORDER — TEMAZEPAM 15 MG PO CAPS: 15.0000 mg | ORAL_CAPSULE | Freq: Every evening | ORAL | 0 refills | Status: DC | PRN

## 2015-10-28 MED ORDER — SODIUM CHLORIDE 0.9 % IV SOLN
Freq: Once | INTRAVENOUS | Status: AC
  Administered 2015-10-28: 10:00:00 via INTRAVENOUS
  Filled 2015-10-28: qty 1000

## 2015-10-28 MED ORDER — DIPHENHYDRAMINE HCL 25 MG PO CAPS
50.0000 mg | ORAL_CAPSULE | Freq: Once | ORAL | Status: AC
  Administered 2015-10-28: 50 mg via ORAL
  Filled 2015-10-28: qty 2

## 2015-10-28 MED ORDER — ACETAMINOPHEN 325 MG PO TABS
650.0000 mg | ORAL_TABLET | Freq: Once | ORAL | Status: AC
  Administered 2015-10-28: 650 mg via ORAL
  Filled 2015-10-28: qty 2

## 2015-10-28 MED ORDER — DENOSUMAB 120 MG/1.7ML ~~LOC~~ SOLN
120.0000 mg | Freq: Once | SUBCUTANEOUS | Status: AC
  Administered 2015-10-28: 120 mg via SUBCUTANEOUS
  Filled 2015-10-28: qty 1.7

## 2015-10-28 MED ORDER — SODIUM CHLORIDE 0.9 % IV SOLN
3.6000 mg/kg | Freq: Once | INTRAVENOUS | Status: AC
  Administered 2015-10-28: 200 mg via INTRAVENOUS
  Filled 2015-10-28: qty 10

## 2015-10-28 NOTE — Progress Notes (Signed)
Patient states she does not get hungry but forces herself to eat.  States she does not sleep well and is asking for something to help her.  Also requesting refill for Oxycodone. BP elevated this morning. 167/91 HR 80  Recheck 153/95  HR 80.

## 2015-10-28 NOTE — Progress Notes (Signed)
[facility] day:  10/28/15  Chief Complaint: Elizabeth Bond is a 65 y.o. female with metastatic Her2/neu positive right breast cancer who is seen for assessment prior to cycle #20 Kadcyla.  HPI:  The patient was last seen by me in the medical oncology clinic on 10/07/2015.  At that time, she felt good.  Exam was unremarkable.  She had mild thrombocytopenia (platelets 127,000).  EF was 50%.  She received cycle #19 Kadcyla.  During the interim, she has done well.  Her only complaint is inability to sleep.    Past Medical History:  Diagnosis Date  . Breast cancer ([place])    right  . Cancer ([place]) 2015   breast and bone  . COPD (chronic obstructive pulmonary disease) ([place])   . IBS (irritable bowel syndrome)   . Metastasis to bone ([place]) 09/01/2014    Past Surgical History:  Procedure Laterality Date  . ABDOMINAL HYSTERECTOMY  1978  . PORTACATH PLACEMENT  2015  . REPLACEMENT TOTAL KNEE Left 2004-2005?    Family History  Problem Relation Age of Onset  . Cancer Father     prostate  . Cancer Mother     Pancreatic  . Cancer Maternal Aunt     breast  . Cancer Cousin     breast  . Cancer Other     lung cancer - neice  . Cancer - Other Daughter     brain    Social History:  reports that she quit smoking about 2 years ago. Her smoking use included Cigarettes. She has a 10.00 pack-year smoking history. She has never used smokeless tobacco. She reports that she does not drink alcohol or use drugs.  The patient is acconmpanied by her daughter, Baruch Gouty, today.  Allergies:  Allergies  Allergen Reactions  . Fentanyl Other (See Comments)    "loopy and confused"    Current Medications: Current Outpatient Prescriptions  Medication Sig Dispense Refill  . acetaminophen (TYLENOL) 500 MG tablet Take 1,000 mg by mouth every 8 (eight) hours as needed for headache.    . Calcium Carb-Cholecalciferol (CALCIUM-VITAMIN D3) 600-400 MG-UNIT TABS Take 1  tablet by mouth 2 (two) times daily.    . clonazePAM (KLONOPIN) 0.5 MG tablet Take 1 tablet (0.5 mg total) by mouth 3 (three) times daily as needed for anxiety. 90 tablet 2  . furosemide (LASIX) 20 MG tablet TAKE ONE TABLET BY MOUTH EVERY OTHER DAYAS NEEDED FOR SWELLING 30 tablet 1  . loratadine (CLARITIN) 10 MG tablet Take 10 mg by mouth daily.    . Magnesium 250 MG TABS Take 1 each by mouth daily.     . ondansetron (ZOFRAN) 4 MG tablet Take 1 tablet (4 mg total) by mouth every 8 (eight) hours as needed for nausea or vomiting. 20 tablet 1  . oxycodone (OXY-IR) 5 MG capsule Take 1 tablet every 6 hours by mouth as needed for pain 60 capsule 0  . pantoprazole (PROTONIX) 40 MG tablet Take 1 tablet (40 mg total) by mouth daily. 30 tablet 6  . phosphorus (PHOSPHA 250 NEUTRAL) 155-852-130 MG tablet Take 1 tablet (250 mg total) by mouth 2 (two) times daily. 60 tablet 2  . potassium chloride (K-DUR) 10 MEQ tablet Take 1 tablet (10 mEq total) by mouth daily. 30 tablet 2   No current facility-administered medications for this visit.     Review of Systems:  GENERAL:  Feels good.  No fevers or sweats.  Weight down  2 pounds. PERFORMANCE STATUS (ECOG):  0 HEENT:  No visual changes, runny nose, sore throat, mouth sores or tenderness. Lungs: No shortness of breath.  Chronic cough.  No hemoptysis. Cardiac:  No chest pain, palpitations, orthopnea, or PND.  Blood pressure up as worried about scans GI:  Eating well.  No nausea, vomiting, diarrhea, constipation, melena or hematochezia. GU:  No urgency, frequency, dysuria, or hematuria. Musculoskeletal:  No back pain.  No joint pain.  No muscle tenderness. Extremities:  No pain or swelling. Skin:  No rashes or skin changes. Neuro:  No headache, numbness or weakness, balance or coordination issues. Endocrine:  No diabetes, thyroid issues, hot flashes or night sweats. Psych:  Insomnia.  No mood changes, depression or anxiety. Pain:  No focal pain.  Takes pain  pill at night (no change). Review of systems:  All other systems reviewed and found to be negative.   Physical Exam: Blood pressure (!) 153/95, pulse 80, temperature (!) 96.5 F (35.8 C), temperature source Tympanic, resp. rate 18, weight 113 lb 1.5 oz (51.3 kg). GENERAL:  Thin woman sitting comfortably in the exam room in no acute distress. MENTAL STATUS:  Alert and oriented to person, place and time. HEAD:  Short styled gray hair.  Normocephalic, atraumatic, face symmetric, no Cushingoid features. EYES: Hazel eyes.  Pupils equal round and reactive to light and accomodation.  No conjunctivitis or scleral icterus. ENT:  Oropharynx clear without lesion.  Tongue normal. Mucous membranes moist.  RESPIRATORY:  Clear to auscultation without rales, wheezes or rhonchi. CARDIOVASCULAR:  Regular rate and rhythm without murmur, rub or gallop.  No JVD. ABDOMEN:  Soft, non-tender, with active bowel sounds, and no hepatosplenomegaly.  No masses. SKIN:  No rashes, ulcers or lesions. EXTREMITIES: No edema, no skin discoloration or tenderness.  No palpable cords. LYMPH NODES: No palpable cervical, supraclavicular, axillary or inguinal adenopathy  NEUROLOGICAL: Unremarkable. PSYCH:  Appropriate.    Infusion on 10/28/2015  Component Date Value Ref Range Status  . WBC 10/28/2015 6.1  3.6 - 11.0 K/uL Final  . RBC 10/28/2015 4.10  3.80 - 5.20 MIL/uL Final  . Hemoglobin 10/28/2015 13.7  12.0 - 16.0 g/dL Final  . HCT 10/28/2015 38.6  35.0 - 47.0 % Final  . MCV 10/28/2015 94.2  80.0 - 100.0 fL Final  . MCH 10/28/2015 33.5  26.0 - 34.0 pg Final  . MCHC 10/28/2015 35.6  32.0 - 36.0 g/dL Final  . RDW 10/28/2015 16.3* 11.5 - 14.5 % Final  . Platelets 10/28/2015 153  150 - 440 K/uL Final  . Neutrophils Relative % 10/28/2015 79  % Final  . Neutro Abs 10/28/2015 4.7  1.4 - 6.5 K/uL Final  . Lymphocytes Relative 10/28/2015 13  % Final  . Lymphs Abs 10/28/2015 0.8* 1.0 - 3.6 K/uL Final  . Monocytes Relative  10/28/2015 7  % Final  . Monocytes Absolute 10/28/2015 0.4  0.2 - 0.9 K/uL Final  . Eosinophils Relative 10/28/2015 1  % Final  . Eosinophils Absolute 10/28/2015 0.1  0 - 0.7 K/uL Final  . Basophils Relative 10/28/2015 0  % Final  . Basophils Absolute 10/28/2015 0.0  0 - 0.1 K/uL Final  . Sodium 10/28/2015 135  135 - 145 mmol/L Final  . Potassium 10/28/2015 3.7  3.5 - 5.1 mmol/L Final  . Chloride 10/28/2015 102  101 - 111 mmol/L Final  . CO2 10/28/2015 25  22 - 32 mmol/L Final  . Glucose, Bld 10/28/2015 88  65 - 99 mg/dL Final  .  BUN 10/28/2015 13  6 - 20 mg/dL Final  . Creatinine, Ser 10/28/2015 0.58  0.44 - 1.00 mg/dL Final  . Calcium 10/28/2015 9.2  8.9 - 10.3 mg/dL Final  . Total Protein 10/28/2015 8.8* 6.5 - 8.1 g/dL Final  . Albumin 10/28/2015 3.9  3.5 - 5.0 g/dL Final  . AST 10/28/2015 53* 15 - 41 U/L Final  . ALT 10/28/2015 29  14 - 54 U/L Final  . Alkaline Phosphatase 10/28/2015 79  38 - 126 U/L Final  . Total Bilirubin 10/28/2015 1.3* 0.3 - 1.2 mg/dL Final  . GFR calc non Af Amer 10/28/2015 >60  >60 mL/min Final  . GFR calc Af Amer 10/28/2015 >60  >60 mL/min Final   Comment: (NOTE) The eGFR has been calculated using the CKD EPI equation. This calculation has not been validated in all clinical situations. eGFR's persistently <60 mL/min signify possible Chronic Kidney Disease.   . Anion gap 10/28/2015 8  5 - 15 Final  . Magnesium 10/28/2015 1.9  1.7 - 2.4 mg/dL Final    Assessment:  DOMINI VANDEHEI is a 65 y.o. female with metastatic Her2/neu positive right breast cancer.  She initially presented in 03/2013 with an ulcerated breast mass and back pain.  Breast biopsy revealed lobular breast cancer.  Tumor was ER/PR positive and HER-2/neu positive. PET scan revealed bone metastasis. She received palliative radiation to T8 and T10 from 04/02-04/15/2015.  She declined chemotherapy. She initially began letrozole and Tykerb. Tykerb was discontinued after 1 dose. She received  letrozole from 05/02/2013 until 01/07/2014 .  She received Herceptin from 06/02/2013 until 12/15/2013. She received fulvestrant monthly from 01/07/2014 until 05/27/2014.    She has received Xgeva monthly (03/31/2013 until 08/25/2014; last 09/16/2015).  She was switched to every 6 weeks Xgeva to coordinate with her treatment.  Herceptin was discontinued for a rising CA-27-29.  CA27.29 was 916.3 on 03/19/2013, 286.2 on 06/23/2013, 70.8 on 08/25/2013, 56.3 on 10/08/2013, 102.7 on 12/15/2013, 149.4 on 01/07/2014, 178.0 on 02/04/2014, 190.7 on 03/30/2014, 143.2 on 04/22/2014, 134.8 on 05/20/2014, 248.3 on 07/09/2014, 723.2 on 08/25/2014, 252.3 on 10/08/2014, 126 on 10/29/2014, 71.3 on 11/24/2014, 55 on 12/15/2014, 70.6 on 01/05/2015, 48.6 on 01/26/2015, 52.7 on 02/15/2015, 54.7 on 03/30/2015, 54 on 04/20/2015, 55.2 on 06/01/2015, 48.8 on 06/22/2015, 49.7 08/03/2015, 44.6 on 08/24/2015, 44.5 on 09/16/2015, and 44.8 on 10/07/2015.   She received Ibrance and Faslodex from 02/23/2014 until 07/27/2014.    Echo on 09/04/2014 revealed an EF of 55-65%.  Echo on 12/14/2014, 03/15/2015, 06/15/2015 revealed an EF of 55-60%, and 50% on 09/23/2015.   PET scan on 08/28/2014 revealed significant interval worsening and multifocal osseous metastatic disease. Lesions had increased in number and metabolic activity. There were no extra osseous metastasis.  She is s/p 19 cycles of Kadcyla (09/10/2014 - 10/07/2015).  She is tolerating treatment well.  She denies any bone pain.   PET scan on 02/08/2015 revealed a complete metabolic response in the sclerotic osseous metastases, with no residual hypermetabolic metastatic disease.  PET scan on 08/23/2015 revealed no new or progressive hypermetabolic metastatic disease.   Bone scan on 07/30/2015 revealed multifocal osseous metastases (sternum, thoracolumbar spine, multiple ribs, and right iliac crest).  The dominant left acetabular metastasis noted on prior PET was not evident.  Some of the other sites of disease appear to have progressed.  There was no prior bone scan for comparison.  She was admitted from 12/22/2014 - 12/24/2014 with E coli sepsis secondary to a UTI.  Serum protein is  elevated.  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  Symptomatically, she feels good.  She has insomnia.  Exam is unremarkable.  Platelet count is normal (platelets 153,000).   She has mild hyperbilirubinemia (1.3).  Potassium and calcium are normal on supplementation.  Plan: 1.  Labs today:  CBC with diff, CMP, Mg, CA27.29. 2.  Add fractionated bilirubin. 3.  Cycle #20 Kadcyla.  4.  Xgeva today. 5.  Schedule short term follow-up echo 11/16/2015. 6.  Rx:  Restoril.  Discuss not taking with pain medications or other anxiolytics (clonazepam). 7.  RTC in 3 weeks for MD assessment, labs (CBC with diff, CMP, Mg, CA27.29), and cycle #21 Kadcyla (T-DM1)   Lequita Asal, MD  10/28/2015, 9:31 AM

## 2015-10-29 LAB — CANCER ANTIGEN 27.29: CA 27.29: 52.1 U/mL — ABNORMAL HIGH (ref 0.0–38.6)

## 2015-10-31 ENCOUNTER — Encounter: Payer: Self-pay | Admitting: Hematology and Oncology

## 2015-11-10 ENCOUNTER — Other Ambulatory Visit: Payer: Self-pay | Admitting: *Deleted

## 2015-11-10 DIAGNOSIS — E876 Hypokalemia: Secondary | ICD-10-CM

## 2015-11-10 NOTE — Telephone Encounter (Signed)
States she needs refill of potassium, she has 1 refill left on bottle, but states we had doubled her dose so the pharmacy will not refill it as the directions are on current rx, it is too early.   When I looked at chart, I see in 10/5 note that she was to double her potassium for 3 - 4 days only, then resume 10 meq daily. Elizabeth Bond informed of this and states she has been talking twice the amount since 10/5.  Please advise if she needs to come in for a lab check this week, or what you would like to do regarding her potassium prescription. She has been out of potassium since the weekend.

## 2015-11-10 NOTE — Telephone Encounter (Signed)
Per Dr Mike Gip, come in for lab check tomorrow, [facility] agreed to appt at 915 AM

## 2015-11-11 ENCOUNTER — Telehealth: Payer: Self-pay | Admitting: *Deleted

## 2015-11-11 ENCOUNTER — Inpatient Hospital Stay: Payer: Medicare Other | Attending: Hematology and Oncology

## 2015-11-11 DIAGNOSIS — C7951 Secondary malignant neoplasm of bone: Secondary | ICD-10-CM | POA: Insufficient documentation

## 2015-11-11 DIAGNOSIS — Z79899 Other long term (current) drug therapy: Secondary | ICD-10-CM | POA: Insufficient documentation

## 2015-11-11 DIAGNOSIS — Z170 Estrogen receptor positive status [ER+]: Secondary | ICD-10-CM | POA: Insufficient documentation

## 2015-11-11 DIAGNOSIS — E876 Hypokalemia: Secondary | ICD-10-CM | POA: Diagnosis not present

## 2015-11-11 DIAGNOSIS — D696 Thrombocytopenia, unspecified: Secondary | ICD-10-CM | POA: Insufficient documentation

## 2015-11-11 DIAGNOSIS — D890 Polyclonal hypergammaglobulinemia: Secondary | ICD-10-CM | POA: Diagnosis not present

## 2015-11-11 DIAGNOSIS — C50911 Malignant neoplasm of unspecified site of right female breast: Secondary | ICD-10-CM | POA: Insufficient documentation

## 2015-11-11 DIAGNOSIS — Z87891 Personal history of nicotine dependence: Secondary | ICD-10-CM | POA: Insufficient documentation

## 2015-11-11 DIAGNOSIS — Z5111 Encounter for antineoplastic chemotherapy: Secondary | ICD-10-CM

## 2015-11-11 DIAGNOSIS — Z923 Personal history of irradiation: Secondary | ICD-10-CM | POA: Diagnosis not present

## 2015-11-11 LAB — POTASSIUM: Potassium: 4 mmol/L (ref 3.5–5.1)

## 2015-11-11 MED ORDER — POTASSIUM CHLORIDE ER 10 MEQ PO TBCR: 10.0000 meq | EXTENDED_RELEASE_TABLET | Freq: Every day | ORAL | 2 refills | Status: DC

## 2015-11-11 NOTE — Telephone Encounter (Signed)
Called pt and got her voicemail and left message potassium level  Is 4.0 and that is normal but we know when she does not take any. We are sending a new rx for potassium and the same dose 1 a day to her pharmacy.

## 2015-11-16 ENCOUNTER — Ambulatory Visit
Admission: RE | Admit: 2015-11-16 | Discharge: 2015-11-16 | Disposition: A | Discharge status: Home / Self Care | Payer: Medicare Other | Source: Ambulatory Visit | Attending: Hematology and Oncology | Admitting: Hematology and Oncology

## 2015-11-16 DIAGNOSIS — Z170 Estrogen receptor positive status [ER+]: Secondary | ICD-10-CM

## 2015-11-16 DIAGNOSIS — C50911 Malignant neoplasm of unspecified site of right female breast: Secondary | ICD-10-CM | POA: Diagnosis not present

## 2015-11-16 DIAGNOSIS — I351 Nonrheumatic aortic (valve) insufficiency: Secondary | ICD-10-CM | POA: Insufficient documentation

## 2015-11-16 DIAGNOSIS — Z87891 Personal history of nicotine dependence: Secondary | ICD-10-CM | POA: Diagnosis not present

## 2015-11-16 DIAGNOSIS — I071 Rheumatic tricuspid insufficiency: Secondary | ICD-10-CM | POA: Diagnosis not present

## 2015-11-18 ENCOUNTER — Inpatient Hospital Stay (HOSPITAL_BASED_OUTPATIENT_CLINIC_OR_DEPARTMENT_OTHER): Payer: Medicare Other | Admitting: Hematology and Oncology

## 2015-11-18 ENCOUNTER — Inpatient Hospital Stay: Payer: Medicare Other

## 2015-11-18 ENCOUNTER — Other Ambulatory Visit: Payer: Self-pay | Admitting: Hematology and Oncology

## 2015-11-18 ENCOUNTER — Encounter: Payer: Self-pay | Admitting: Hematology and Oncology

## 2015-11-18 ENCOUNTER — Telehealth: Payer: Self-pay | Admitting: *Deleted

## 2015-11-18 VITALS — BP 134/83 | HR 85 | Temp 96.4°F | Resp 18 | Wt 113.5 lb

## 2015-11-18 DIAGNOSIS — C50919 Malignant neoplasm of unspecified site of unspecified female breast: Secondary | ICD-10-CM

## 2015-11-18 DIAGNOSIS — C50911 Malignant neoplasm of unspecified site of right female breast: Secondary | ICD-10-CM

## 2015-11-18 DIAGNOSIS — C7951 Secondary malignant neoplasm of bone: Secondary | ICD-10-CM

## 2015-11-18 DIAGNOSIS — Z170 Estrogen receptor positive status [ER+]: Secondary | ICD-10-CM

## 2015-11-18 DIAGNOSIS — D696 Thrombocytopenia, unspecified: Secondary | ICD-10-CM

## 2015-11-18 DIAGNOSIS — D890 Polyclonal hypergammaglobulinemia: Secondary | ICD-10-CM | POA: Diagnosis not present

## 2015-11-18 DIAGNOSIS — Z87891 Personal history of nicotine dependence: Secondary | ICD-10-CM

## 2015-11-18 DIAGNOSIS — E876 Hypokalemia: Secondary | ICD-10-CM

## 2015-11-18 DIAGNOSIS — Z5111 Encounter for antineoplastic chemotherapy: Secondary | ICD-10-CM

## 2015-11-18 DIAGNOSIS — Z923 Personal history of irradiation: Secondary | ICD-10-CM

## 2015-11-18 DIAGNOSIS — Z79899 Other long term (current) drug therapy: Secondary | ICD-10-CM

## 2015-11-18 LAB — CBC WITH DIFFERENTIAL/PLATELET
Basophils Absolute: 0.1 10*3/uL (ref 0–0.1)
Basophils Relative: 1 %
Eosinophils Absolute: 0 10*3/uL (ref 0–0.7)
Eosinophils Relative: 1 %
HCT: 36.9 % (ref 35.0–47.0)
Hemoglobin: 13 g/dL (ref 12.0–16.0)
Lymphocytes Relative: 18 %
Lymphs Abs: 0.8 10*3/uL — ABNORMAL LOW (ref 1.0–3.6)
MCH: 33.7 pg (ref 26.0–34.0)
MCHC: 35.2 g/dL (ref 32.0–36.0)
MCV: 95.5 fL (ref 80.0–100.0)
Monocytes Absolute: 0.3 10*3/uL (ref 0.2–0.9)
Monocytes Relative: 7 %
Neutro Abs: 3.3 10*3/uL (ref 1.4–6.5)
Neutrophils Relative %: 73 %
Platelets: 118 10*3/uL — ABNORMAL LOW (ref 150–440)
RBC: 3.87 MIL/uL (ref 3.80–5.20)
RDW: 16.6 % — ABNORMAL HIGH (ref 11.5–14.5)
WBC: 4.6 10*3/uL (ref 3.6–11.0)

## 2015-11-18 LAB — COMPREHENSIVE METABOLIC PANEL
ALT: 28 U/L (ref 14–54)
AST: 49 U/L — ABNORMAL HIGH (ref 15–41)
Albumin: 3.5 g/dL (ref 3.5–5.0)
Alkaline Phosphatase: 68 U/L (ref 38–126)
Anion gap: 5 (ref 5–15)
BUN: 16 mg/dL (ref 6–20)
CO2: 26 mmol/L (ref 22–32)
Calcium: 9.3 mg/dL (ref 8.9–10.3)
Chloride: 106 mmol/L (ref 101–111)
Creatinine, Ser: 0.63 mg/dL (ref 0.44–1.00)
GFR calc Af Amer: >60 mL/min (ref >60)
GFR calc non Af Amer: >60 mL/min (ref >60)
Glucose, Bld: 114 mg/dL — ABNORMAL HIGH (ref 65–99)
Potassium: 3.4 mmol/L — ABNORMAL LOW (ref 3.5–5.1)
Sodium: 137 mmol/L (ref 135–145)
Total Bilirubin: 1.3 mg/dL — ABNORMAL HIGH (ref 0.3–1.2)
Total Protein: 8.3 g/dL — ABNORMAL HIGH (ref 6.5–8.1)

## 2015-11-18 LAB — MAGNESIUM: Magnesium: 1.7 mg/dL (ref 1.7–2.4)

## 2015-11-18 MED ORDER — ACETAMINOPHEN 325 MG PO TABS
650.0000 mg | ORAL_TABLET | Freq: Once | ORAL | Status: AC
  Administered 2015-11-18: 650 mg via ORAL
  Filled 2015-11-18: qty 2

## 2015-11-18 MED ORDER — DIPHENHYDRAMINE HCL 25 MG PO CAPS
50.0000 mg | ORAL_CAPSULE | Freq: Once | ORAL | Status: AC
  Administered 2015-11-18: 50 mg via ORAL
  Filled 2015-11-18: qty 2

## 2015-11-18 MED ORDER — HEPARIN SOD (PORK) LOCK FLUSH 100 UNIT/ML IV SOLN
INTRAVENOUS | Status: AC
  Filled 2015-11-18: qty 5

## 2015-11-18 MED ORDER — HEPARIN SOD (PORK) LOCK FLUSH 100 UNIT/ML IV SOLN
500.0000 [IU] | Freq: Once | INTRAVENOUS | Status: AC | PRN
  Administered 2015-11-18: 500 [IU]

## 2015-11-18 MED ORDER — SODIUM CHLORIDE 0.9 % IV SOLN
3.6000 mg/kg | Freq: Once | INTRAVENOUS | Status: AC
  Administered 2015-11-18: 200 mg via INTRAVENOUS
  Filled 2015-11-18: qty 10

## 2015-11-18 MED ORDER — SODIUM CHLORIDE 0.9 % IV SOLN
Freq: Once | INTRAVENOUS | Status: AC
  Administered 2015-11-18: 10:00:00 via INTRAVENOUS
  Filled 2015-11-18: qty 1000

## 2015-11-18 NOTE — Progress Notes (Signed)
[facility] day:  11/18/15  Chief Complaint: Elizabeth Bond is a 65 y.o. female with metastatic Her2/neu positive right breast cancer who is seen for assessment prior to cycle #21 Kadcyla.  HPI:  The patient was last seen by me in the medical oncology clinic on 10/28/2015.  At that time, she felt good.  She only noted insomnia.  Exam was unremarkable.  Bilirubin was 1.3 (direct 0.2).  She received cycle #20 Kadcyla.  She received Xgeva.  A prescription for Restoril was provided.  We discussed plans for short term follow-up echo.  Echo on 11/16/2015 revealed an EF of 60-65%.  During the interim, she has done well. She denies any complaints.   Past Medical History:  Diagnosis Date  . Breast cancer ([place])    right  . Cancer ([place]) 2015   breast and bone  . COPD (chronic obstructive pulmonary disease) ([place])   . IBS (irritable bowel syndrome)   . Metastasis to bone ([place]) 09/01/2014    Past Surgical History:  Procedure Laterality Date  . ABDOMINAL HYSTERECTOMY  1978  . PORTACATH PLACEMENT  2015  . REPLACEMENT TOTAL KNEE Left 2004-2005?    Family History  Problem Relation Age of Onset  . Cancer Father     prostate  . Cancer Mother     Pancreatic  . Cancer Maternal Aunt     breast  . Cancer Cousin     breast  . Cancer Other     lung cancer - neice  . Cancer - Other Daughter     brain    Social History:  reports that she quit smoking about 2 years ago. Her smoking use included Cigarettes. She has a 10.00 pack-year smoking history. She has never used smokeless tobacco. She reports that she does not drink alcohol or use drugs.  The patient is accompanied by her daughter, Elizabeth Bond, today.  Allergies:  Allergies  Allergen Reactions  . Fentanyl Other (See Comments)    "loopy and confused"    Current Medications: Current Outpatient Prescriptions  Medication Sig Dispense Refill  . acetaminophen (TYLENOL) 500 MG tablet Take 1,000 mg by  mouth every 8 (eight) hours as needed for headache.    . Calcium Carb-Cholecalciferol (CALCIUM-VITAMIN D3) 600-400 MG-UNIT TABS Take 1 tablet by mouth 2 (two) times daily.    . clonazePAM (KLONOPIN) 0.5 MG tablet Take 1 tablet (0.5 mg total) by mouth 3 (three) times daily as needed for anxiety. 90 tablet 2  . furosemide (LASIX) 20 MG tablet TAKE ONE TABLET BY MOUTH EVERY OTHER DAYAS NEEDED FOR SWELLING 30 tablet 1  . loratadine (CLARITIN) 10 MG tablet Take 10 mg by mouth daily.    . Magnesium 250 MG TABS Take 1 each by mouth daily.     Marland Kitchen oxycodone (OXY-IR) 5 MG capsule Take 1 tablet every 6 hours by mouth as needed for pain 60 capsule 0  . pantoprazole (PROTONIX) 40 MG tablet Take 1 tablet (40 mg total) by mouth daily. 30 tablet 6  . phosphorus (PHOSPHA 250 NEUTRAL) 155-852-130 MG tablet Take 1 tablet (250 mg total) by mouth 2 (two) times daily. 60 tablet 2  . temazepam (RESTORIL) 15 MG capsule Take 1 capsule (15 mg total) by mouth at bedtime as needed for sleep. 30 capsule 0  . ondansetron (ZOFRAN) 4 MG tablet Take 1 tablet (4 mg total) by mouth every 8 (eight) hours as needed for nausea or vomiting. (Patient not taking: Reported  on 11/18/2015) 20 tablet 1  . potassium chloride (K-DUR) 10 MEQ tablet Take 1 tablet (10 mEq total) by mouth daily. (Patient not taking: Reported on 11/18/2015) 30 tablet 2   No current facility-administered medications for this visit.     Review of Systems:  GENERAL:  Feels good.  No fevers or sweats.  Weight stable. PERFORMANCE STATUS (ECOG):  0 HEENT:  No visual changes, runny nose, sore throat, mouth sores or tenderness. Lungs: No shortness of breath.  Chronic cough.  No hemoptysis. Cardiac:  No chest pain, palpitations, orthopnea, or PND.  Blood pressure up as worried about scans GI:  Eating well.  No nausea, vomiting, diarrhea, constipation, melena or hematochezia. GU:  No urgency, frequency, dysuria, or hematuria. Musculoskeletal:  No back pain.  No joint  pain.  No muscle tenderness. Extremities:  No pain or swelling. Skin:  No rashes or skin changes. Neuro:  No headache, numbness or weakness, balance or coordination issues. Endocrine:  No diabetes, thyroid issues, hot flashes or night sweats. Psych:  Insomnia improved on Restoril.  No mood changes, depression or anxiety. Pain:  No focal pain.   Review of systems:  All other systems reviewed and found to be negative.   Physical Exam: Blood pressure 134/83, pulse 85, temperature (!) 96.4 F (35.8 C), temperature source Tympanic, resp. rate 18, weight 113 lb 8.6 oz (51.5 kg). GENERAL:  Thin woman sitting comfortably in the exam room in no acute distress. MENTAL STATUS:  Alert and oriented to person, place and time. HEAD:  Short styled gray hair.  Normocephalic, atraumatic, face symmetric, no Cushingoid features. EYES: Hazel eyes.  Pupils equal round and reactive to light and accomodation.  No conjunctivitis or scleral icterus. ENT:  Oropharynx clear without lesion.  Tongue normal. Mucous membranes moist.  RESPIRATORY:  Clear to auscultation without rales, wheezes or rhonchi. CARDIOVASCULAR:  Regular rate and rhythm without murmur, rub or gallop.  No JVD. ABDOMEN:  Soft, non-tender, with active bowel sounds, and no hepatosplenomegaly.  No masses. SKIN:  No rashes, ulcers or lesions. EXTREMITIES: No edema, no skin discoloration or tenderness.  No palpable cords. LYMPH NODES: No palpable cervical, supraclavicular, axillary or inguinal adenopathy  NEUROLOGICAL: Unremarkable. PSYCH:  Appropriate.    Appointment on 11/18/2015  Component Date Value Ref Range Status  . WBC 11/18/2015 4.6  3.6 - 11.0 K/uL Final  . RBC 11/18/2015 3.87  3.80 - 5.20 MIL/uL Final  . Hemoglobin 11/18/2015 13.0  12.0 - 16.0 g/dL Final  . HCT 11/18/2015 36.9  35.0 - 47.0 % Final  . MCV 11/18/2015 95.5  80.0 - 100.0 fL Final  . MCH 11/18/2015 33.7  26.0 - 34.0 pg Final  . MCHC 11/18/2015 35.2  32.0 - 36.0 g/dL Final   . RDW 11/18/2015 16.6* 11.5 - 14.5 % Final  . Platelets 11/18/2015 118* 150 - 440 K/uL Final  . Neutrophils Relative % 11/18/2015 73  % Final  . Neutro Abs 11/18/2015 3.3  1.4 - 6.5 K/uL Final  . Lymphocytes Relative 11/18/2015 18  % Final  . Lymphs Abs 11/18/2015 0.8* 1.0 - 3.6 K/uL Final  . Monocytes Relative 11/18/2015 7  % Final  . Monocytes Absolute 11/18/2015 0.3  0.2 - 0.9 K/uL Final  . Eosinophils Relative 11/18/2015 1  % Final  . Eosinophils Absolute 11/18/2015 0.0  0 - 0.7 K/uL Final  . Basophils Relative 11/18/2015 1  % Final  . Basophils Absolute 11/18/2015 0.1  0 - 0.1 K/uL Final  . Sodium  11/18/2015 137  135 - 145 mmol/L Final  . Potassium 11/18/2015 3.4* 3.5 - 5.1 mmol/L Final  . Chloride 11/18/2015 106  101 - 111 mmol/L Final  . CO2 11/18/2015 26  22 - 32 mmol/L Final  . Glucose, Bld 11/18/2015 114* 65 - 99 mg/dL Final  . BUN 11/18/2015 16  6 - 20 mg/dL Final  . Creatinine, Ser 11/18/2015 0.63  0.44 - 1.00 mg/dL Final  . Calcium 11/18/2015 9.3  8.9 - 10.3 mg/dL Final  . Total Protein 11/18/2015 8.3* 6.5 - 8.1 g/dL Final  . Albumin 11/18/2015 3.5  3.5 - 5.0 g/dL Final  . AST 11/18/2015 49* 15 - 41 U/L Final  . ALT 11/18/2015 28  14 - 54 U/L Final  . Alkaline Phosphatase 11/18/2015 68  38 - 126 U/L Final  . Total Bilirubin 11/18/2015 1.3* 0.3 - 1.2 mg/dL Final  . GFR calc non Af Amer 11/18/2015 >60  >60 mL/min Final  . GFR calc Af Amer 11/18/2015 >60  >60 mL/min Final   Comment: (NOTE) The eGFR has been calculated using the CKD EPI equation. This calculation has not been validated in all clinical situations. eGFR's persistently <60 mL/min signify possible Chronic Kidney Disease.   . Anion gap 11/18/2015 5  5 - 15 Final  . Magnesium 11/18/2015 1.7  1.7 - 2.4 mg/dL Final    Assessment:  Elizabeth Bond is a 65 y.o. female with metastatic Her2/neu positive right breast cancer.  She initially presented in 03/2013 with an ulcerated breast mass and back pain.  Breast  biopsy revealed lobular breast cancer.  Tumor was ER/PR positive and HER-2/neu positive. PET scan revealed bone metastasis. She received palliative radiation to T8 and T10 from 04/02-04/15/2015.  She declined chemotherapy. She initially began letrozole and Tykerb. Tykerb was discontinued after 1 dose. She received letrozole from 05/02/2013 until 01/07/2014 .  She received Herceptin from 06/02/2013 until 12/15/2013. She received fulvestrant monthly from 01/07/2014 until 05/27/2014.    She has received Xgeva monthly (03/31/2013 until 08/25/2014; last 10/28/2015).  She was switched to every 6 weeks Xgeva to coordinate with her treatment.  Herceptin was discontinued for a rising CA-27-29.  CA27.29 was 916.3 on 03/19/2013, 286.2 on 06/23/2013, 70.8 on 08/25/2013, 56.3 on 10/08/2013, 102.7 on 12/15/2013, 149.4 on 01/07/2014, 178.0 on 02/04/2014, 190.7 on 03/30/2014, 143.2 on 04/22/2014, 134.8 on 05/20/2014, 248.3 on 07/09/2014, 723.2 on 08/25/2014, 252.3 on 10/08/2014, 126 on 10/29/2014, 71.3 on 11/24/2014, 55 on 12/15/2014, 70.6 on 01/05/2015, 48.6 on 01/26/2015, 52.7 on 02/15/2015, 54.7 on 03/30/2015, 54 on 04/20/2015, 55.2 on 06/01/2015, 48.8 on 06/22/2015, 49.7 08/03/2015, 44.6 on 08/24/2015, 44.5 on 09/16/2015, and 44.8 on 10/07/2015.   She received Ibrance and Faslodex from 02/23/2014 until 07/27/2014.    Echo on 09/04/2014 revealed an EF of 55-65%.  Echo on 12/14/2014, 03/15/2015, 06/15/2015 revealed an EF of 55-60%, 50% on 09/23/2015, and 60-65% on 11/16/2015.   PET scan on 08/28/2014 revealed significant interval worsening and multifocal osseous metastatic disease. Lesions had increased in number and metabolic activity. There were no extra osseous metastasis.  She is s/p 20 cycles of Kadcyla (09/10/2014 - 10/28/2015).  She is tolerating treatment well.  She denies any bone pain.   PET scan on 02/08/2015 revealed a complete metabolic response in the sclerotic osseous metastases, with no residual  hypermetabolic metastatic disease.  PET scan on 08/23/2015 revealed no new or progressive hypermetabolic metastatic disease.   Bone scan on 07/30/2015 revealed multifocal osseous metastases (sternum, thoracolumbar spine, multiple ribs, and  right iliac crest).  The dominant left acetabular metastasis noted on prior PET was not evident. Some of the other sites of disease appear to have progressed.  There was no prior bone scan for comparison.  PET scan on 08/23/2015 revealed no new or progressive hypermetabolic disease.  There was stable mildly hypermetabolic sclerotic metastasis in the right proximal femoral metaphysis.  There was stable nonspecific mild patchy hypermetabolism in the upper and lower spine correlating with mixed lytic and sclerotic osseous metastases in the CT images  She was admitted from 12/22/2014 - 12/24/2014 with E coli sepsis secondary to a UTI.  Serum protein is elevated.  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  Symptomatically, she feels good.  Exam is unremarkable.  She has mild thrombocytopenia (platelets 118,000).   She has stable mild hyperbilirubinemia (1.3; direct fraction 0.2 on 10/28/2015).  She has mild hypokalemia off supplementation.  Plan: 1.  Labs today:  CBC with diff, CMP, Mg, CA27.29. 2.  Review interim echo.  Ejection fraction normal.  Next echo in 3 months. 3.  Cycle #21 Kadcyla.  4.  Take potassium 10 meq BID x 3 days then return to baseline supplementation (10 meq q day). 5.  Discuss plans for repeat bone scan in 01/2016. 6.  RTC in 3 weeks for MD assessment, labs (CBC with diff, CMP, Mg, CA27.29), Delton See, cycle #22 Kadcyla (T-DM1)   Lequita Asal, MD  11/18/2015, 9:56 AM

## 2015-11-18 NOTE — Addendum Note (Signed)
Addended by: Luella Cook on: 11/18/2015 10:05 AM   Modules accepted: Orders

## 2015-11-18 NOTE — Telephone Encounter (Signed)
I called [facility] to ask about refills of temazepman and phospho sodium.  She was given total 3 refill of both meds.  [facility] has been paying for copay for pt and we will need to cont. Paying copay for these refills just given.

## 2015-11-18 NOTE — Progress Notes (Signed)
Patient is not sleeping well.  States even with her medication she is not sleeping.  Requesting refills for: Temazepam and Phospha/Mona Sodium.  Wants to know if these prescriptions can be covered by [employer] through [employer].

## 2015-11-19 LAB — CANCER ANTIGEN 27.29: CA 27.29: 47 U/mL — ABNORMAL HIGH (ref 0.0–38.6)

## 2015-12-02 ENCOUNTER — Other Ambulatory Visit: Payer: Self-pay | Admitting: *Deleted

## 2015-12-02 ENCOUNTER — Other Ambulatory Visit: Payer: Self-pay | Admitting: Hematology and Oncology

## 2015-12-02 MED ORDER — TEMAZEPAM 15 MG PO CAPS: 15.0000 mg | ORAL_CAPSULE | Freq: Every evening | ORAL | 0 refills | Status: DC | PRN

## 2015-12-02 NOTE — Telephone Encounter (Signed)
Asking for refill on Temazepam and wants the [facility] to pay for it

## 2015-12-09 ENCOUNTER — Inpatient Hospital Stay: Payer: Medicare Other

## 2015-12-09 ENCOUNTER — Inpatient Hospital Stay (HOSPITAL_BASED_OUTPATIENT_CLINIC_OR_DEPARTMENT_OTHER): Payer: Medicare Other | Admitting: Hematology and Oncology

## 2015-12-09 ENCOUNTER — Other Ambulatory Visit: Payer: Self-pay

## 2015-12-09 ENCOUNTER — Other Ambulatory Visit: Payer: Self-pay | Admitting: Hematology and Oncology

## 2015-12-09 ENCOUNTER — Inpatient Hospital Stay: Payer: Medicare Other | Attending: Hematology and Oncology

## 2015-12-09 VITALS — BP 162/91 | HR 82 | Temp 96.6°F | Resp 18 | Wt 113.1 lb

## 2015-12-09 DIAGNOSIS — C7951 Secondary malignant neoplasm of bone: Secondary | ICD-10-CM | POA: Diagnosis not present

## 2015-12-09 DIAGNOSIS — F17210 Nicotine dependence, cigarettes, uncomplicated: Secondary | ICD-10-CM | POA: Insufficient documentation

## 2015-12-09 DIAGNOSIS — D890 Polyclonal hypergammaglobulinemia: Secondary | ICD-10-CM

## 2015-12-09 DIAGNOSIS — E876 Hypokalemia: Secondary | ICD-10-CM | POA: Insufficient documentation

## 2015-12-09 DIAGNOSIS — Z170 Estrogen receptor positive status [ER+]: Secondary | ICD-10-CM

## 2015-12-09 DIAGNOSIS — Z79899 Other long term (current) drug therapy: Secondary | ICD-10-CM | POA: Diagnosis not present

## 2015-12-09 DIAGNOSIS — D696 Thrombocytopenia, unspecified: Secondary | ICD-10-CM

## 2015-12-09 DIAGNOSIS — C50911 Malignant neoplasm of unspecified site of right female breast: Secondary | ICD-10-CM | POA: Diagnosis not present

## 2015-12-09 DIAGNOSIS — Z5111 Encounter for antineoplastic chemotherapy: Secondary | ICD-10-CM

## 2015-12-09 DIAGNOSIS — R17 Unspecified jaundice: Secondary | ICD-10-CM

## 2015-12-09 DIAGNOSIS — K589 Irritable bowel syndrome without diarrhea: Secondary | ICD-10-CM | POA: Insufficient documentation

## 2015-12-09 DIAGNOSIS — J449 Chronic obstructive pulmonary disease, unspecified: Secondary | ICD-10-CM | POA: Diagnosis not present

## 2015-12-09 LAB — COMPREHENSIVE METABOLIC PANEL
ALT: 35 U/L (ref 14–54)
AST: 58 U/L — ABNORMAL HIGH (ref 15–41)
Albumin: 3.7 g/dL (ref 3.5–5.0)
Alkaline Phosphatase: 77 U/L (ref 38–126)
Anion gap: 8 (ref 5–15)
BUN: 17 mg/dL (ref 6–20)
CO2: 26 mmol/L (ref 22–32)
Calcium: 9.6 mg/dL (ref 8.9–10.3)
Chloride: 103 mmol/L (ref 101–111)
Creatinine, Ser: 0.58 mg/dL (ref 0.44–1.00)
GFR calc Af Amer: >60 mL/min (ref >60)
GFR calc non Af Amer: >60 mL/min (ref >60)
Glucose, Bld: 91 mg/dL (ref 65–99)
Potassium: 3.6 mmol/L (ref 3.5–5.1)
Sodium: 137 mmol/L (ref 135–145)
Total Bilirubin: 1.3 mg/dL — ABNORMAL HIGH (ref 0.3–1.2)
Total Protein: 8.4 g/dL — ABNORMAL HIGH (ref 6.5–8.1)

## 2015-12-09 LAB — CBC WITH DIFFERENTIAL/PLATELET
Basophils Absolute: 0 10*3/uL (ref 0–0.1)
Basophils Relative: 1 %
Eosinophils Absolute: 0.1 10*3/uL (ref 0–0.7)
Eosinophils Relative: 1 %
HCT: 37.8 % (ref 35.0–47.0)
Hemoglobin: 13.2 g/dL (ref 12.0–16.0)
Lymphocytes Relative: 18 %
Lymphs Abs: 0.9 10*3/uL — ABNORMAL LOW (ref 1.0–3.6)
MCH: 33.2 pg (ref 26.0–34.0)
MCHC: 34.9 g/dL (ref 32.0–36.0)
MCV: 95.1 fL (ref 80.0–100.0)
Monocytes Absolute: 0.4 10*3/uL (ref 0.2–0.9)
Monocytes Relative: 9 %
Neutro Abs: 3.5 10*3/uL (ref 1.4–6.5)
Neutrophils Relative %: 71 %
Platelets: 129 10*3/uL — ABNORMAL LOW (ref 150–440)
RBC: 3.98 MIL/uL (ref 3.80–5.20)
RDW: 16.6 % — ABNORMAL HIGH (ref 11.5–14.5)
WBC: 5 10*3/uL (ref 3.6–11.0)

## 2015-12-09 LAB — BILIRUBIN, DIRECT: Bilirubin, Direct: 0.3 mg/dL (ref 0.1–0.5)

## 2015-12-09 LAB — MAGNESIUM: Magnesium: 1.8 mg/dL (ref 1.7–2.4)

## 2015-12-09 MED ORDER — K PHOS MONO-SOD PHOS DI & MONO 155-852-130 MG PO TABS: 250.0000 mg | ORAL_TABLET | Freq: Two times a day (BID) | ORAL | 2 refills | Status: DC

## 2015-12-09 MED ORDER — ACETAMINOPHEN 325 MG PO TABS
650.0000 mg | ORAL_TABLET | Freq: Once | ORAL | Status: AC
  Administered 2015-12-09: 650 mg via ORAL
  Filled 2015-12-09: qty 2

## 2015-12-09 MED ORDER — SODIUM CHLORIDE 0.9 % IV SOLN
3.6000 mg/kg | Freq: Once | INTRAVENOUS | Status: AC
  Administered 2015-12-09: 200 mg via INTRAVENOUS
  Filled 2015-12-09: qty 10

## 2015-12-09 MED ORDER — SODIUM CHLORIDE 0.9 % IV SOLN
Freq: Once | INTRAVENOUS | Status: AC
  Administered 2015-12-09: 11:00:00 via INTRAVENOUS
  Filled 2015-12-09: qty 1000

## 2015-12-09 MED ORDER — SODIUM CHLORIDE 0.9 % IJ SOLN
10.0000 mL | INTRAMUSCULAR | Status: DC | PRN
  Administered 2015-12-09: 10 mL
  Filled 2015-12-09: qty 10

## 2015-12-09 MED ORDER — POTASSIUM CHLORIDE ER 10 MEQ PO TBCR: 10.0000 meq | EXTENDED_RELEASE_TABLET | Freq: Every day | ORAL | 2 refills | Status: DC

## 2015-12-09 MED ORDER — DENOSUMAB 120 MG/1.7ML ~~LOC~~ SOLN
120.0000 mg | Freq: Once | SUBCUTANEOUS | Status: AC
  Administered 2015-12-09: 120 mg via SUBCUTANEOUS
  Filled 2015-12-09: qty 1.7

## 2015-12-09 MED ORDER — DIPHENHYDRAMINE HCL 25 MG PO CAPS
50.0000 mg | ORAL_CAPSULE | Freq: Once | ORAL | Status: AC
  Administered 2015-12-09: 50 mg via ORAL
  Filled 2015-12-09: qty 2

## 2015-12-09 MED ORDER — HEPARIN SOD (PORK) LOCK FLUSH 100 UNIT/ML IV SOLN
500.0000 [IU] | Freq: Once | INTRAVENOUS | Status: AC | PRN
  Administered 2015-12-09: 500 [IU]
  Filled 2015-12-09: qty 5

## 2015-12-09 NOTE — Progress Notes (Signed)
[facility] day:  12/09/15  Chief Complaint: Elizabeth Bond is a 65 y.o. female with metastatic Her2/neu positive right breast cancer who is seen for assessment prior to cycle #22 Kadcyla.  HPI:  The patient was last seen by me in the medical oncology clinic on 11/18/2015.  At that time, she was doing well. She had mild thrombocytopenia (platelets 118,000).   She had stable mild hyperbilirubinemia (1.3; direct fraction 0.2 on 10/28/2015).  She had mild hypokalemia off supplementation.  She received cycle #21 Kadcyla.  During the interim, she has done well. She denies any complaints.   Past Medical History:  Diagnosis Date  . Breast cancer ([place])    right  . Cancer ([place]) 2015   breast and bone  . COPD (chronic obstructive pulmonary disease) ([place])   . IBS (irritable bowel syndrome)   . Metastasis to bone ([place]) 09/01/2014    Past Surgical History:  Procedure Laterality Date  . ABDOMINAL HYSTERECTOMY  1978  . PORTACATH PLACEMENT  2015  . REPLACEMENT TOTAL KNEE Left 2004-2005?    Family History  Problem Relation Age of Onset  . Cancer Father     prostate  . Cancer Mother     Pancreatic  . Cancer Maternal Aunt     breast  . Cancer Cousin     breast  . Cancer Other     lung cancer - neice  . Cancer - Other Daughter     brain    Social History:  reports that she quit smoking about 2 years ago. Her smoking use included Cigarettes. She has a 10.00 pack-year smoking history. She has never used smokeless tobacco. She reports that she does not drink alcohol or use drugs.  The patient is accompanied by her daughter, Baruch Gouty, today.  Allergies:  Allergies  Allergen Reactions  . Fentanyl Other (See Comments)    "loopy and confused"    Current Medications: Current Outpatient Prescriptions  Medication Sig Dispense Refill  . acetaminophen (TYLENOL) 500 MG tablet Take 1,000 mg by mouth every 8 (eight) hours as needed for headache.    .  Calcium Carb-Cholecalciferol (CALCIUM-VITAMIN D3) 600-400 MG-UNIT TABS Take 1 tablet by mouth 2 (two) times daily.    . clonazePAM (KLONOPIN) 0.5 MG tablet Take 1 tablet (0.5 mg total) by mouth 3 (three) times daily as needed for anxiety. 90 tablet 2  . furosemide (LASIX) 20 MG tablet TAKE ONE TABLET BY MOUTH EVERY OTHER DAYAS NEEDED FOR SWELLING 30 tablet 1  . loratadine (CLARITIN) 10 MG tablet Take 10 mg by mouth daily.    . Magnesium 250 MG TABS Take 1 each by mouth daily.     . ondansetron (ZOFRAN) 4 MG tablet Take 1 tablet (4 mg total) by mouth every 8 (eight) hours as needed for nausea or vomiting. 20 tablet 1  . oxycodone (OXY-IR) 5 MG capsule Take 1 tablet every 6 hours by mouth as needed for pain 60 capsule 0  . pantoprazole (PROTONIX) 40 MG tablet Take 1 tablet (40 mg total) by mouth daily. 30 tablet 6  . phosphorus (PHOSPHA 250 NEUTRAL) 155-852-130 MG tablet Take 1 tablet (250 mg total) by mouth 2 (two) times daily. 60 tablet 2  . potassium chloride (K-DUR) 10 MEQ tablet Take 1 tablet (10 mEq total) by mouth daily. 30 tablet 2  . temazepam (RESTORIL) 15 MG capsule Take 1 capsule (15 mg total) by mouth at bedtime as needed for sleep. [place]  capsule 0   No current facility-administered medications for this visit.     Review of Systems:  GENERAL:  Feels good.  No fevers or sweats.  Weight stable. PERFORMANCE STATUS (ECOG):  0 HEENT:  No visual changes, runny nose, sore throat, mouth sores or tenderness. Lungs: No shortness of breath.  Chronic cough.  No hemoptysis. Cardiac:  No chest pain, palpitations, orthopnea, or PND.  Blood pressure up as worried about scans GI:  No nausea, vomiting, diarrhea, constipation, melena or hematochezia. GU:  No urgency, frequency, dysuria, or hematuria. Musculoskeletal:  No back pain.  No joint pain.  No muscle tenderness. Extremities:  No pain or swelling. Skin:  No rashes or skin changes. Neuro:  No headache, numbness or weakness, balance or  coordination issues. Endocrine:  No diabetes, thyroid issues, hot flashes or night sweats. Psych:  Insomnia improved on Restoril.  No mood changes, depression or anxiety. Pain:  No focal pain.   Review of systems:  All other systems reviewed and found to be negative.   Physical Exam: Blood pressure (!) 153/82, pulse 80, temperature (!) 96.6 F (35.9 C), temperature source Tympanic, resp. rate 18, weight 113 lb 1.5 oz (51.3 kg). GENERAL:  Thin woman sitting comfortably in the exam room in no acute distress. MENTAL STATUS:  Alert and oriented to person, place and time. HEAD:  Short styled gray hair.  Normocephalic, atraumatic, face symmetric, no Cushingoid features. EYES: Hazel eyes.  Pupils equal round and reactive to light and accomodation.  No conjunctivitis or scleral icterus. ENT:  Oropharynx clear without lesion.  Tongue normal. Mucous membranes moist.  RESPIRATORY:  Clear to auscultation without rales, wheezes or rhonchi. CARDIOVASCULAR:  Regular rate and rhythm without murmur, rub or gallop.  No JVD. ABDOMEN:  Soft, non-tender, with active bowel sounds, and no hepatosplenomegaly.  No masses. SKIN:  No rashes, ulcers or lesions. EXTREMITIES: No edema, no skin discoloration or tenderness.  No palpable cords. LYMPH NODES: No palpable cervical, supraclavicular, axillary or inguinal adenopathy  NEUROLOGICAL: Unremarkable. PSYCH:  Appropriate.    Orders Only on 12/09/2015  Component Date Value Ref Range Status  . WBC 12/09/2015 5.0  3.6 - 11.0 K/uL Final  . RBC 12/09/2015 3.98  3.80 - 5.20 MIL/uL Final  . Hemoglobin 12/09/2015 13.2  12.0 - 16.0 g/dL Final  . HCT 12/09/2015 37.8  35.0 - 47.0 % Final  . MCV 12/09/2015 95.1  80.0 - 100.0 fL Final  . MCH 12/09/2015 33.2  26.0 - 34.0 pg Final  . MCHC 12/09/2015 34.9  32.0 - 36.0 g/dL Final  . RDW 12/09/2015 16.6* 11.5 - 14.5 % Final  . Platelets 12/09/2015 129* 150 - 440 K/uL Final  . Neutrophils Relative % 12/09/2015 71  % Final  .  Neutro Abs 12/09/2015 3.5  1.4 - 6.5 K/uL Final  . Lymphocytes Relative 12/09/2015 18  % Final  . Lymphs Abs 12/09/2015 0.9* 1.0 - 3.6 K/uL Final  . Monocytes Relative 12/09/2015 9  % Final  . Monocytes Absolute 12/09/2015 0.4  0.2 - 0.9 K/uL Final  . Eosinophils Relative 12/09/2015 1  % Final  . Eosinophils Absolute 12/09/2015 0.1  0 - 0.7 K/uL Final  . Basophils Relative 12/09/2015 1  % Final  . Basophils Absolute 12/09/2015 0.0  0 - 0.1 K/uL Final  . Sodium 12/09/2015 137  135 - 145 mmol/L Final  . Potassium 12/09/2015 3.6  3.5 - 5.1 mmol/L Final  . Chloride 12/09/2015 103  101 - 111 mmol/L Final  .  CO2 12/09/2015 26  22 - 32 mmol/L Final  . Glucose, Bld 12/09/2015 91  65 - 99 mg/dL Final  . BUN 12/09/2015 17  6 - 20 mg/dL Final  . Creatinine, Ser 12/09/2015 0.58  0.44 - 1.00 mg/dL Final  . Calcium 12/09/2015 9.6  8.9 - 10.3 mg/dL Final  . Total Protein 12/09/2015 8.4* 6.5 - 8.1 g/dL Final  . Albumin 12/09/2015 3.7  3.5 - 5.0 g/dL Final  . AST 12/09/2015 58* 15 - 41 U/L Final  . ALT 12/09/2015 35  14 - 54 U/L Final  . Alkaline Phosphatase 12/09/2015 77  38 - 126 U/L Final  . Total Bilirubin 12/09/2015 1.3* 0.3 - 1.2 mg/dL Final  . GFR calc non Af Amer 12/09/2015 >60  >60 mL/min Final  . GFR calc Af Amer 12/09/2015 >60  >60 mL/min Final   Comment: (NOTE) The eGFR has been calculated using the CKD EPI equation. This calculation has not been validated in all clinical situations. eGFR's persistently <60 mL/min signify possible Chronic Kidney Disease.   . Anion gap 12/09/2015 8  5 - 15 Final  . Magnesium 12/09/2015 1.8  1.7 - 2.4 mg/dL Final  . Bilirubin, Direct 12/09/2015 0.3  0.1 - 0.5 mg/dL Final    Assessment:  MAIZE BRITTINGHAM is a 65 y.o. female with metastatic Her2/neu positive right breast cancer.  She initially presented in 03/2013 with an ulcerated breast mass and back pain.  Breast biopsy revealed lobular breast cancer.  Tumor was ER/PR positive and HER-2/neu positive. PET  scan revealed bone metastasis. She received palliative radiation to T8 and T10 from 04/02-04/15/2015.  She declined chemotherapy. She initially began letrozole and Tykerb. Tykerb was discontinued after 1 dose. She received letrozole from 05/02/2013 until 01/07/2014 .  She received Herceptin from 06/02/2013 until 12/15/2013. She received fulvestrant monthly from 01/07/2014 until 05/27/2014.    She has received Xgeva monthly (03/31/2013 until 08/25/2014; last 10/28/2015).  She was switched to every 6 weeks Xgeva to coordinate with her treatment.  Herceptin was discontinued for a rising CA-27-29.  CA27.29 was 916.3 on 03/19/2013, 286.2 on 06/23/2013, 70.8 on 08/25/2013, 56.3 on 10/08/2013, 102.7 on 12/15/2013, 149.4 on 01/07/2014, 178.0 on 02/04/2014, 190.7 on 03/30/2014, 143.2 on 04/22/2014, 134.8 on 05/20/2014, 248.3 on 07/09/2014, 723.2 on 08/25/2014, 252.3 on 10/08/2014, 126 on 10/29/2014, 71.3 on 11/24/2014, 55 on 12/15/2014, 70.6 on 01/05/2015, 48.6 on 01/26/2015, 52.7 on 02/15/2015, 54.7 on 03/30/2015, 54 on 04/20/2015, 55.2 on 06/01/2015, 48.8 on 06/22/2015, 49.7 08/03/2015, 44.6 on 08/24/2015, 44.5 on 09/16/2015, 44.8 on 10/07/2015, 52.1 on 10/28/2015, 47.0 on 11/18/2015, and 49.9 on 12/09/2015.   She received Ibrance and Faslodex from 02/23/2014 until 07/27/2014.    Echo on 09/04/2014 revealed an EF of 55-65%.  Echo on 12/14/2014, 03/15/2015, 06/15/2015 revealed an EF of 55-60%, 50% on 09/23/2015, and 60-65% on 11/16/2015.   PET scan on 08/28/2014 revealed significant interval worsening and multifocal osseous metastatic disease. Lesions had increased in number and metabolic activity. There were no extra osseous metastasis.  She is s/p 21 cycles of Kadcyla (09/10/2014 - 11/18/2015).  She is tolerating treatment well.  She denies any bone pain.   PET scan on 02/08/2015 revealed a complete metabolic response in the sclerotic osseous metastases, with no residual hypermetabolic metastatic disease.   PET scan on 08/23/2015 revealed no new or progressive hypermetabolic metastatic disease.   Bone scan on 07/30/2015 revealed multifocal osseous metastases (sternum, thoracolumbar spine, multiple ribs, and right iliac crest).  The dominant left acetabular metastasis  noted on prior PET was not evident. Some of the other sites of disease appear to have progressed.  There was no prior bone scan for comparison.  PET scan on 08/23/2015 revealed no new or progressive hypermetabolic disease.  There was stable mildly hypermetabolic sclerotic metastasis in the right proximal femoral metaphysis.  There was stable nonspecific mild patchy hypermetabolism in the upper and lower spine correlating with mixed lytic and sclerotic osseous metastases in the CT images  She was admitted from 12/22/2014 - 12/24/2014 with E coli sepsis secondary to a UTI.  Serum protein is elevated.  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  Symptomatically, she feels good.  Exam is unremarkable.  She has mild thrombocytopenia (platelets 129,000).   She has stable mild hyperbilirubinemia (1.3; direct fraction 0.3 on 12/09/2015).  Calcium is 9.6 (normal).  Potassium is normal on supplementation.  Plan: 1.  Labs today:  CBC with diff, CMP, Mg, CA27.29. 2.  Cycle #22 Kadcyla.  3.  Xgeva today. 4.  Continue potassium 10 meq q day. 5.  Anticipate bone scan around 01/30/2016. 6.  RTC in 3 weeks for MD assessment, labs (CBC with diff, CMP, Mg, CA27.29), and cycle #23 Kadcyla (T-DM1)   Lequita Asal, MD  12/09/2015, 10:18 AM

## 2015-12-09 NOTE — Progress Notes (Signed)
Patient has not had any new diagnosis since last visit.  Requesting refills for Phospha and Potassium.

## 2015-12-10 LAB — CANCER ANTIGEN 27.29: CA 27.29: 49.9 U/mL — ABNORMAL HIGH (ref 0.0–38.6)

## 2015-12-29 ENCOUNTER — Encounter: Payer: Self-pay | Admitting: Hematology and Oncology

## 2015-12-29 NOTE — Progress Notes (Signed)
Elizabeth Bond is a 65 y.o. female with metastatic Her2/neu positive right breast cancer who is seen for assessment prior to cycle #23 Kadcyla.  HPI:  The patient was last seen by me in the medical oncology clinic on 12/09/2015.  At that time, she was doing well. She had mild thrombocytopenia (platelets 129,000).   She had stable mild hyperbilirubinemia (1.3; direct fraction 0.3).  Potassium and calcium were normal.  She received cycle #22 Kadcyla.  She received Xgeva.  During the interim, she has continued to feel good.  She denies any bone pain.  She denies any bone pain.  Weight is up 1 pound.   Past Medical History:  Diagnosis Date  . Breast cancer ([place])    right  . Cancer ([place]) 2015   breast and bone  . COPD (chronic obstructive pulmonary disease) ([place])   . IBS (irritable bowel syndrome)   . Metastasis to bone ([place]) 09/01/2014    Past Surgical History:  Procedure Laterality Date  . ABDOMINAL HYSTERECTOMY  1978  . PORTACATH PLACEMENT  2015  . REPLACEMENT TOTAL KNEE Left 2004-2005?    Family History  Problem Relation Age of Onset  . Cancer Father     prostate  . Cancer Mother     Pancreatic  . Cancer Maternal Aunt     breast  . Cancer Cousin     breast  . Cancer Other     lung cancer - neice  . Cancer - Other Daughter     brain    Social History:  reports that she quit smoking about 2 years ago. Her smoking use included Cigarettes. She has a 10.00 pack-year smoking history. She has never used smokeless tobacco. She reports that she does not drink alcohol or use drugs.  The patient is alone today.  Allergies:  Allergies  Allergen Reactions  . Fentanyl Other (See Comments)    "loopy and confused"    Current Medications: Current Outpatient Prescriptions  Medication Sig Dispense Refill  . acetaminophen (TYLENOL) 500 MG tablet Take 1,000 mg by mouth every 8 (eight) hours as  needed for headache.    . Calcium Carb-Cholecalciferol (CALCIUM-VITAMIN D3) 600-400 MG-UNIT TABS Take 1 tablet by mouth 2 (two) times daily.    . clonazePAM (KLONOPIN) 0.5 MG tablet Take 1 tablet (0.5 mg total) by mouth 3 (three) times daily as needed for anxiety. 90 tablet 2  . furosemide (LASIX) 20 MG tablet TAKE ONE TABLET BY MOUTH EVERY OTHER DAYAS NEEDED FOR SWELLING 30 tablet 1  . loratadine (CLARITIN) 10 MG tablet Take 10 mg by mouth daily.    . Magnesium 250 MG TABS Take 1 each by mouth daily.     . ondansetron (ZOFRAN) 4 MG tablet Take 1 tablet (4 mg total) by mouth every 8 (eight) hours as needed for nausea or vomiting. 20 tablet 1  . oxycodone (OXY-IR) 5 MG capsule Take 1 tablet every 6 hours by mouth as needed for pain 60 capsule 0  . pantoprazole (PROTONIX) 40 MG tablet Take 1 tablet (40 mg total) by mouth daily. 30 tablet 6  . phosphorus (PHOSPHA 250 NEUTRAL) 155-852-130 MG tablet Take 1 tablet (250 mg total) by mouth 2 (two) times daily. 60 tablet 2  . potassium chloride (K-DUR) 10 MEQ tablet Take 1 tablet (10 mEq total) by mouth daily. 30 tablet 2  . temazepam (RESTORIL) 15 MG capsule Take 1  capsule (15 mg total) by mouth at bedtime as needed for sleep. 30 capsule 0   No current facility-administered medications for this visit.    Facility-Administered Medications Ordered in Other Visits  Medication Dose Route Frequency Provider Last Rate Last Dose  . heparin lock flush 100 unit/mL  500 Units Intravenous Once Lequita Asal, MD      . sodium chloride flush (NS) 0.9 % injection 10 mL  10 mL Intravenous PRN Lequita Asal, MD   10 mL at 12/30/15 8937    Review of Systems:  GENERAL:  Feels good.  No fevers or sweats.  Weight up 1 pound. PERFORMANCE STATUS (ECOG):  0 HEENT:  No visual changes, runny nose, sore throat, mouth sores or tenderness. Lungs: No shortness of breath.  Chronic cough.  No hemoptysis.  Continues to smoke. Cardiac:  No chest pain, palpitations,  orthopnea, or PND.   GI:  No nausea, vomiting, diarrhea, constipation, melena or hematochezia. GU:  No urgency, frequency, dysuria, or hematuria. Musculoskeletal:  No back pain.  No joint pain.  No muscle tenderness. Extremities:  No pain or swelling. Skin:  No rashes or skin changes. Neuro:  No headache, numbness or weakness, balance or coordination issues. Endocrine:  No diabetes, thyroid issues, hot flashes or night sweats. Psych:  Insomnia improved on Restoril.  No mood changes, depression or anxiety. Pain:  No focal pain.   Review of systems:  All other systems reviewed and found to be negative.   Physical Exam: Blood pressure (!) 149/86, pulse 80, temperature (!) 94.8 F (34.9 C), temperature source Tympanic, resp. rate 18, weight 114 lb 13.8 oz (52.1 kg). GENERAL:  Thin woman sitting comfortably in the exam room in no acute distress. MENTAL STATUS:  Alert and oriented to person, place and time. HEAD:  Short styled gray hair.  Normocephalic, atraumatic, face symmetric, no Cushingoid features. EYES: Hazel eyes.  Pupils equal round and reactive to light and accomodation.  No conjunctivitis or scleral icterus. ENT:  Oropharynx clear without lesion.  Tongue normal. Mucous membranes moist.  RESPIRATORY:  Clear to auscultation without rales, wheezes or rhonchi. CARDIOVASCULAR:  Regular rate and rhythm without murmur, rub or gallop.  No JVD. ABDOMEN:  Soft, non-tender, with active bowel sounds, and no hepatosplenomegaly.  No masses. SKIN:  No rashes, ulcers or lesions. EXTREMITIES: No edema, no skin discoloration or tenderness.  No palpable cords. LYMPH NODES: No palpable cervical, supraclavicular, axillary or inguinal adenopathy  NEUROLOGICAL: Unremarkable. PSYCH:  Appropriate.    Infusion on 12/30/2015  Component Date Value Ref Range Status  . WBC 12/30/2015 4.8  3.6 - 11.0 K/uL Final  . RBC 12/30/2015 3.73* 3.80 - 5.20 MIL/uL Final  . Hemoglobin 12/30/2015 12.8  12.0 - 16.0 g/dL  Final  . HCT 12/30/2015 36.3  35.0 - 47.0 % Final  . MCV 12/30/2015 97.4  80.0 - 100.0 fL Final  . MCH 12/30/2015 34.3* 26.0 - 34.0 pg Final  . MCHC 12/30/2015 35.2  32.0 - 36.0 g/dL Final  . RDW 12/30/2015 16.2* 11.5 - 14.5 % Final  . Platelets 12/30/2015 129* 150 - 440 K/uL Final  . Neutrophils Relative % 12/30/2015 71  % Final  . Neutro Abs 12/30/2015 3.4  1.4 - 6.5 K/uL Final  . Lymphocytes Relative 12/30/2015 18  % Final  . Lymphs Abs 12/30/2015 0.8* 1.0 - 3.6 K/uL Final  . Monocytes Relative 12/30/2015 9  % Final  . Monocytes Absolute 12/30/2015 0.4  0.2 - 0.9 K/uL Final  .  Eosinophils Relative 12/30/2015 1  % Final  . Eosinophils Absolute 12/30/2015 0.1  0 - 0.7 K/uL Final  . Basophils Relative 12/30/2015 1  % Final  . Basophils Absolute 12/30/2015 0.0  0 - 0.1 K/uL Final  . Sodium 12/30/2015 137  135 - 145 mmol/L Final  . Potassium 12/30/2015 3.4* 3.5 - 5.1 mmol/L Final  . Chloride 12/30/2015 103  101 - 111 mmol/L Final  . CO2 12/30/2015 28  22 - 32 mmol/L Final  . Glucose, Bld 12/30/2015 89  65 - 99 mg/dL Final  . BUN 12/30/2015 14  6 - 20 mg/dL Final  . Creatinine, Ser 12/30/2015 0.51  0.44 - 1.00 mg/dL Final  . Calcium 12/30/2015 9.8  8.9 - 10.3 mg/dL Final  . Total Protein 12/30/2015 8.2* 6.5 - 8.1 g/dL Final  . Albumin 12/30/2015 3.6  3.5 - 5.0 g/dL Final  . AST 12/30/2015 58* 15 - 41 U/L Final  . ALT 12/30/2015 32  14 - 54 U/L Final  . Alkaline Phosphatase 12/30/2015 80  38 - 126 U/L Final  . Total Bilirubin 12/30/2015 1.3* 0.3 - 1.2 mg/dL Final  . GFR calc non Af Amer 12/30/2015 >60  >60 mL/min Final  . GFR calc Af Amer 12/30/2015 >60  >60 mL/min Final   Comment: (NOTE) The eGFR has been calculated using the CKD EPI equation. This calculation has not been validated in all clinical situations. eGFR's persistently <60 mL/min signify possible Chronic Kidney Disease.   . Anion gap 12/30/2015 6  5 - 15 Final  . Magnesium 12/30/2015 1.6* 1.7 - 2.4 mg/dL Final     Assessment:  Elizabeth Bond is a 65 y.o. female with metastatic Her2/neu positive right breast cancer.  She initially presented in 03/2013 with an ulcerated breast mass and back pain.  Breast biopsy revealed lobular breast cancer.  Tumor was ER/PR positive and HER-2/neu positive. PET scan revealed bone metastasis. She received palliative radiation to T8 and T10 from 04/02-04/15/2015.  She declined chemotherapy. She initially began letrozole and Tykerb. Tykerb was discontinued after 1 dose. She received letrozole from 05/02/2013 until 01/07/2014 .  She received Herceptin from 06/02/2013 until 12/15/2013. She received fulvestrant monthly from 01/07/2014 until 05/27/2014.    She has received Xgeva monthly (03/31/2013 until 08/25/2014; last 12/09/2015).  She was switched to every 6 weeks Xgeva to coordinate with her treatment.  Herceptin was discontinued for a rising CA-27-29.  CA27.29 was 916.3 on 03/19/2013, 286.2 on 06/23/2013, 70.8 on 08/25/2013, 56.3 on 10/08/2013, 102.7 on 12/15/2013, 149.4 on 01/07/2014, 178.0 on 02/04/2014, 190.7 on 03/30/2014, 143.2 on 04/22/2014, 134.8 on 05/20/2014, 248.3 on 07/09/2014, 723.2 on 08/25/2014, 252.3 on 10/08/2014, 126 on 10/29/2014, 71.3 on 11/24/2014, 55 on 12/15/2014, 70.6 on 01/05/2015, 48.6 on 01/26/2015, 52.7 on 02/15/2015, 54.7 on 03/30/2015, 54 on 04/20/2015, 55.2 on 06/01/2015, 48.8 on 06/22/2015, 49.7 08/03/2015, 44.6 on 08/24/2015, 44.5 on 09/16/2015, 44.8 on 10/07/2015, 52.1 on 10/28/2015, 47.0 on 11/18/2015, 49.9 on 12/09/2015, and 46.7 on 12/30/2015.   She received Ibrance and Faslodex from 02/23/2014 until 07/27/2014.    Echo on 09/04/2014 revealed an EF of 55-65%.  Echo on 12/14/2014, 03/15/2015, 06/15/2015 revealed an EF of 55-60%, 50% on 09/23/2015, and 60-65% on 11/16/2015.   PET scan on 08/28/2014 revealed significant interval worsening and multifocal osseous metastatic disease. Lesions had increased in number and metabolic activity. There were  no extra osseous metastasis.  She is s/p 22 cycles of Kadcyla (09/10/2014 - 12/09/2015).  She is tolerating treatment well.  She  denies any bone pain.   PET scan on 02/08/2015 revealed a complete metabolic response in the sclerotic osseous metastases, with no residual hypermetabolic metastatic disease.  PET scan on 08/23/2015 revealed no new or progressive hypermetabolic metastatic disease.   Bone scan on 07/30/2015 revealed multifocal osseous metastases (sternum, thoracolumbar spine, multiple ribs, and right iliac crest).  The dominant left acetabular metastasis noted on prior PET was not evident. Some of the other sites of disease appear to have progressed.  There was no prior bone scan for comparison.  PET scan on 08/23/2015 revealed no new or progressive hypermetabolic disease.  There was stable mildly hypermetabolic sclerotic metastasis in the right proximal femoral metaphysis.  There was stable nonspecific mild patchy hypermetabolism in the upper and lower spine correlating with mixed lytic and sclerotic osseous metastases in the CT images  She was admitted from 12/22/2014 - 12/24/2014 with E coli sepsis secondary to a UTI.  Serum protein is elevated.  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  Symptomatically, she feels good.  Exam is unremarkable.  She has mild thrombocytopenia (platelets 129,000).   She has stable mild hyperbilirubinemia (1.3).  Calcium is 9.8 (normal).  She has mild hypokalemia (3.4) and hypomagnesemia (1.6).  Plan: 1.  Labs today:  CBC with diff, CMP, Mg, CA27.29. 2.  Cycle #23 Kadcyla.  3.  Anticipate bone scan on 01/30/2016. 4.  Anticipate next echo on 02/09/2016. 5.  Continue potassium 10 meq q day x 3 days then back to 10 meq a day 6.  Increase oral magnesium to BID x 4 days.  Patient to call if any diarrhea. 7.  Rx:  Protonix and Restoril. 8.  RTC in 3 weeks for MD assessment, labs (CBC with diff, CMP, Mg, CA27.29), Xgeva, and cycle #24 Kadcyla  (T-DM1)   Lequita Asal, MD  12/30/2015, 9:51 AM

## 2015-12-30 ENCOUNTER — Inpatient Hospital Stay (HOSPITAL_BASED_OUTPATIENT_CLINIC_OR_DEPARTMENT_OTHER): Payer: Medicare Other | Admitting: Hematology and Oncology

## 2015-12-30 ENCOUNTER — Inpatient Hospital Stay: Payer: Medicare Other

## 2015-12-30 VITALS — BP 149/86 | HR 80 | Temp 94.8°F | Resp 18 | Wt 114.9 lb

## 2015-12-30 DIAGNOSIS — Z79899 Other long term (current) drug therapy: Secondary | ICD-10-CM

## 2015-12-30 DIAGNOSIS — D890 Polyclonal hypergammaglobulinemia: Secondary | ICD-10-CM

## 2015-12-30 DIAGNOSIS — E876 Hypokalemia: Secondary | ICD-10-CM

## 2015-12-30 DIAGNOSIS — C50911 Malignant neoplasm of unspecified site of right female breast: Secondary | ICD-10-CM | POA: Diagnosis not present

## 2015-12-30 DIAGNOSIS — D696 Thrombocytopenia, unspecified: Secondary | ICD-10-CM | POA: Diagnosis not present

## 2015-12-30 DIAGNOSIS — R17 Unspecified jaundice: Secondary | ICD-10-CM

## 2015-12-30 DIAGNOSIS — Z170 Estrogen receptor positive status [ER+]: Secondary | ICD-10-CM | POA: Diagnosis not present

## 2015-12-30 DIAGNOSIS — F17210 Nicotine dependence, cigarettes, uncomplicated: Secondary | ICD-10-CM | POA: Diagnosis not present

## 2015-12-30 DIAGNOSIS — Z5111 Encounter for antineoplastic chemotherapy: Secondary | ICD-10-CM

## 2015-12-30 DIAGNOSIS — C7951 Secondary malignant neoplasm of bone: Secondary | ICD-10-CM | POA: Diagnosis not present

## 2015-12-30 LAB — CBC WITH DIFFERENTIAL/PLATELET
Basophils Absolute: 0 10*3/uL (ref 0–0.1)
Basophils Relative: 1 %
Eosinophils Absolute: 0.1 10*3/uL (ref 0–0.7)
Eosinophils Relative: 1 %
HCT: 36.3 % (ref 35.0–47.0)
Hemoglobin: 12.8 g/dL (ref 12.0–16.0)
Lymphocytes Relative: 18 %
Lymphs Abs: 0.8 10*3/uL — ABNORMAL LOW (ref 1.0–3.6)
MCH: 34.3 pg — ABNORMAL HIGH (ref 26.0–34.0)
MCHC: 35.2 g/dL (ref 32.0–36.0)
MCV: 97.4 fL (ref 80.0–100.0)
Monocytes Absolute: 0.4 10*3/uL (ref 0.2–0.9)
Monocytes Relative: 9 %
Neutro Abs: 3.4 10*3/uL (ref 1.4–6.5)
Neutrophils Relative %: 71 %
Platelets: 129 10*3/uL — ABNORMAL LOW (ref 150–440)
RBC: 3.73 MIL/uL — ABNORMAL LOW (ref 3.80–5.20)
RDW: 16.2 % — ABNORMAL HIGH (ref 11.5–14.5)
WBC: 4.8 10*3/uL (ref 3.6–11.0)

## 2015-12-30 LAB — COMPREHENSIVE METABOLIC PANEL
ALT: 32 U/L (ref 14–54)
AST: 58 U/L — ABNORMAL HIGH (ref 15–41)
Albumin: 3.6 g/dL (ref 3.5–5.0)
Alkaline Phosphatase: 80 U/L (ref 38–126)
Anion gap: 6 (ref 5–15)
BUN: 14 mg/dL (ref 6–20)
CO2: 28 mmol/L (ref 22–32)
Calcium: 9.8 mg/dL (ref 8.9–10.3)
Chloride: 103 mmol/L (ref 101–111)
Creatinine, Ser: 0.51 mg/dL (ref 0.44–1.00)
GFR calc Af Amer: >60 mL/min (ref >60)
GFR calc non Af Amer: >60 mL/min (ref >60)
Glucose, Bld: 89 mg/dL (ref 65–99)
Potassium: 3.4 mmol/L — ABNORMAL LOW (ref 3.5–5.1)
Sodium: 137 mmol/L (ref 135–145)
Total Bilirubin: 1.3 mg/dL — ABNORMAL HIGH (ref 0.3–1.2)
Total Protein: 8.2 g/dL — ABNORMAL HIGH (ref 6.5–8.1)

## 2015-12-30 LAB — MAGNESIUM: Magnesium: 1.6 mg/dL — ABNORMAL LOW (ref 1.7–2.4)

## 2015-12-30 MED ORDER — ACETAMINOPHEN 325 MG PO TABS
650.0000 mg | ORAL_TABLET | Freq: Once | ORAL | Status: AC
  Administered 2015-12-30: 650 mg via ORAL
  Filled 2015-12-30: qty 2

## 2015-12-30 MED ORDER — HEPARIN SOD (PORK) LOCK FLUSH 100 UNIT/ML IV SOLN
500.0000 [IU] | Freq: Once | INTRAVENOUS | Status: AC
  Administered 2015-12-30: 500 [IU] via INTRAVENOUS

## 2015-12-30 MED ORDER — SODIUM CHLORIDE 0.9 % IV SOLN
3.6000 mg/kg | Freq: Once | INTRAVENOUS | Status: AC
  Administered 2015-12-30: 200 mg via INTRAVENOUS
  Filled 2015-12-30: qty 10

## 2015-12-30 MED ORDER — SODIUM CHLORIDE 0.9 % IV SOLN
Freq: Once | INTRAVENOUS | Status: AC
  Administered 2015-12-30: 11:00:00 via INTRAVENOUS
  Filled 2015-12-30: qty 1000

## 2015-12-30 MED ORDER — PANTOPRAZOLE SODIUM 40 MG PO TBEC: 40.0000 mg | DELAYED_RELEASE_TABLET | Freq: Every day | ORAL | 6 refills | Status: DC

## 2015-12-30 MED ORDER — HEPARIN SOD (PORK) LOCK FLUSH 100 UNIT/ML IV SOLN
500.0000 [IU] | Freq: Once | INTRAVENOUS | Status: DC | PRN
  Filled 2015-12-30: qty 5

## 2015-12-30 MED ORDER — DIPHENHYDRAMINE HCL 25 MG PO CAPS
50.0000 mg | ORAL_CAPSULE | Freq: Once | ORAL | Status: AC
  Administered 2015-12-30: 50 mg via ORAL
  Filled 2015-12-30: qty 2

## 2015-12-30 MED ORDER — TEMAZEPAM 15 MG PO CAPS: 15.0000 mg | ORAL_CAPSULE | Freq: Every evening | ORAL | 0 refills | Status: DC | PRN

## 2015-12-30 MED ORDER — SODIUM CHLORIDE 0.9% FLUSH
10.0000 mL | INTRAVENOUS | Status: DC | PRN
  Administered 2015-12-30: 10 mL via INTRAVENOUS
  Filled 2015-12-30: qty 10

## 2015-12-30 NOTE — Progress Notes (Signed)
Patient requesting refills for protonix and restoril.

## 2015-12-31 LAB — CANCER ANTIGEN 27.29: CA 27.29: 46.7 U/mL — ABNORMAL HIGH (ref 0.0–38.6)

## 2016-01-20 ENCOUNTER — Inpatient Hospital Stay: Payer: Medicare Other

## 2016-01-20 ENCOUNTER — Inpatient Hospital Stay: Payer: Medicare Other | Admitting: Hematology and Oncology

## 2016-01-27 ENCOUNTER — Other Ambulatory Visit: Payer: Medicare Other

## 2016-01-27 ENCOUNTER — Ambulatory Visit: Payer: Medicare Other | Admitting: Hematology and Oncology

## 2016-01-27 ENCOUNTER — Ambulatory Visit: Payer: Medicare Other

## 2016-02-02 ENCOUNTER — Other Ambulatory Visit: Payer: Self-pay | Admitting: Hematology and Oncology

## 2016-02-03 ENCOUNTER — Inpatient Hospital Stay: Payer: Medicare Other

## 2016-02-03 ENCOUNTER — Encounter: Payer: Self-pay | Admitting: Hematology and Oncology

## 2016-02-03 ENCOUNTER — Inpatient Hospital Stay: Payer: Medicare Other | Attending: Hematology and Oncology

## 2016-02-03 ENCOUNTER — Inpatient Hospital Stay (HOSPITAL_BASED_OUTPATIENT_CLINIC_OR_DEPARTMENT_OTHER): Payer: Medicare Other | Admitting: Hematology and Oncology

## 2016-02-03 VITALS — BP 111/70 | HR 80

## 2016-02-03 VITALS — BP 165/72 | HR 89 | Temp 96.4°F | Resp 18 | Wt 115.5 lb

## 2016-02-03 DIAGNOSIS — D696 Thrombocytopenia, unspecified: Secondary | ICD-10-CM

## 2016-02-03 DIAGNOSIS — F17210 Nicotine dependence, cigarettes, uncomplicated: Secondary | ICD-10-CM | POA: Insufficient documentation

## 2016-02-03 DIAGNOSIS — D890 Polyclonal hypergammaglobulinemia: Secondary | ICD-10-CM | POA: Insufficient documentation

## 2016-02-03 DIAGNOSIS — C50911 Malignant neoplasm of unspecified site of right female breast: Secondary | ICD-10-CM

## 2016-02-03 DIAGNOSIS — Z79899 Other long term (current) drug therapy: Secondary | ICD-10-CM | POA: Insufficient documentation

## 2016-02-03 DIAGNOSIS — Z170 Estrogen receptor positive status [ER+]: Secondary | ICD-10-CM

## 2016-02-03 DIAGNOSIS — C7951 Secondary malignant neoplasm of bone: Secondary | ICD-10-CM

## 2016-02-03 DIAGNOSIS — Z5112 Encounter for antineoplastic immunotherapy: Secondary | ICD-10-CM | POA: Diagnosis not present

## 2016-02-03 DIAGNOSIS — K589 Irritable bowel syndrome without diarrhea: Secondary | ICD-10-CM | POA: Diagnosis not present

## 2016-02-03 DIAGNOSIS — J449 Chronic obstructive pulmonary disease, unspecified: Secondary | ICD-10-CM | POA: Insufficient documentation

## 2016-02-03 DIAGNOSIS — C50919 Malignant neoplasm of unspecified site of unspecified female breast: Secondary | ICD-10-CM

## 2016-02-03 DIAGNOSIS — Z5111 Encounter for antineoplastic chemotherapy: Secondary | ICD-10-CM

## 2016-02-03 LAB — CBC WITH DIFFERENTIAL/PLATELET
Basophils Absolute: 0 10*3/uL (ref 0–0.1)
Basophils Relative: 1 %
Eosinophils Absolute: 0.1 10*3/uL (ref 0–0.7)
Eosinophils Relative: 1 %
HCT: 36.4 % (ref 35.0–47.0)
Hemoglobin: 12.7 g/dL (ref 12.0–16.0)
Lymphocytes Relative: 13 %
Lymphs Abs: 0.8 10*3/uL — ABNORMAL LOW (ref 1.0–3.6)
MCH: 33.8 pg (ref 26.0–34.0)
MCHC: 34.8 g/dL (ref 32.0–36.0)
MCV: 97.3 fL (ref 80.0–100.0)
Monocytes Absolute: 0.5 10*3/uL (ref 0.2–0.9)
Monocytes Relative: 9 %
Neutro Abs: 4.6 10*3/uL (ref 1.4–6.5)
Neutrophils Relative %: 76 %
Platelets: 123 10*3/uL — ABNORMAL LOW (ref 150–440)
RBC: 3.74 MIL/uL — ABNORMAL LOW (ref 3.80–5.20)
RDW: 16 % — ABNORMAL HIGH (ref 11.5–14.5)
WBC: 6.1 10*3/uL (ref 3.6–11.0)

## 2016-02-03 LAB — MAGNESIUM: Magnesium: 1.8 mg/dL (ref 1.7–2.4)

## 2016-02-03 LAB — COMPREHENSIVE METABOLIC PANEL
ALT: 26 U/L (ref 14–54)
AST: 47 U/L — ABNORMAL HIGH (ref 15–41)
Albumin: 3.5 g/dL (ref 3.5–5.0)
Alkaline Phosphatase: 96 U/L (ref 38–126)
Anion gap: 5 (ref 5–15)
BUN: 15 mg/dL (ref 6–20)
CO2: 24 mmol/L (ref 22–32)
Calcium: 8.8 mg/dL — ABNORMAL LOW (ref 8.9–10.3)
Chloride: 108 mmol/L (ref 101–111)
Creatinine, Ser: 0.64 mg/dL (ref 0.44–1.00)
GFR calc Af Amer: >60 mL/min (ref >60)
GFR calc non Af Amer: >60 mL/min (ref >60)
Glucose, Bld: 93 mg/dL (ref 65–99)
Potassium: 3.6 mmol/L (ref 3.5–5.1)
Sodium: 137 mmol/L (ref 135–145)
Total Bilirubin: 1.1 mg/dL (ref 0.3–1.2)
Total Protein: 8 g/dL (ref 6.5–8.1)

## 2016-02-03 LAB — BILIRUBIN, DIRECT: Bilirubin, Direct: 0.3 mg/dL (ref 0.1–0.5)

## 2016-02-03 MED ORDER — DIPHENHYDRAMINE HCL 25 MG PO CAPS
50.0000 mg | ORAL_CAPSULE | Freq: Once | ORAL | Status: AC
  Administered 2016-02-03: 50 mg via ORAL
  Filled 2016-02-03: qty 2

## 2016-02-03 MED ORDER — SODIUM CHLORIDE 0.9 % IJ SOLN
10.0000 mL | INTRAMUSCULAR | Status: DC | PRN
  Administered 2016-02-03: 10 mL
  Filled 2016-02-03: qty 10

## 2016-02-03 MED ORDER — SODIUM CHLORIDE 0.9 % IV SOLN
Freq: Once | INTRAVENOUS | Status: AC
  Administered 2016-02-03: 11:00:00 via INTRAVENOUS
  Filled 2016-02-03: qty 1000

## 2016-02-03 MED ORDER — ACETAMINOPHEN 325 MG PO TABS
650.0000 mg | ORAL_TABLET | Freq: Once | ORAL | Status: AC
Start: 2016-02-03 — End: 2016-02-03
  Administered 2016-02-03: 650 mg via ORAL
  Filled 2016-02-03: qty 2

## 2016-02-03 MED ORDER — SODIUM CHLORIDE 0.9 % IV SOLN
3.6000 mg/kg | Freq: Once | INTRAVENOUS | Status: AC
  Administered 2016-02-03: 200 mg via INTRAVENOUS
  Filled 2016-02-03: qty 10

## 2016-02-03 MED ORDER — HEPARIN SOD (PORK) LOCK FLUSH 100 UNIT/ML IV SOLN
500.0000 [IU] | Freq: Once | INTRAVENOUS | Status: AC | PRN
  Administered 2016-02-03: 500 [IU]
  Filled 2016-02-03: qty 5

## 2016-02-03 NOTE — Progress Notes (Signed)
Patient states she has noticed vision changes.  Things are a little blurry.  Told her chemo can cause vision changes.

## 2016-02-03 NOTE — Progress Notes (Signed)
[facility] day:  02/03/16  Chief Complaint: Elizabeth Bond is a 66 y.o. female with metastatic Her2/neu positive right breast cancer who is seen for assessment prior to cycle #24 Kadcyla.  HPI:  The patient was last seen by me in the medical oncology clinic on 12/30/2015.  At that time, she was doing well. She had mild thrombocytopenia (platelets 129,000).   She had stable mild hyperbilirubinemia (1.3).  She had mild hypokalemia (3.4) and hypomagnesemia (1.6).   Oral supplements were increased.  CA27.29 was 46.7.  She received cycle #23 Kadcyla.   During the interim, she has done well.  She denies any complaints.  She is taking calcium 600 mg BID.  Treatment was delayed secondary to the snow.  She just had a birthday.     Past Medical History:  Diagnosis Date  . Breast cancer ([place])    right  . Cancer ([place]) 2015   breast and bone  . COPD (chronic obstructive pulmonary disease) ([place])   . IBS (irritable bowel syndrome)   . Metastasis to bone ([place]) 09/01/2014    Past Surgical History:  Procedure Laterality Date  . ABDOMINAL HYSTERECTOMY  1978  . PORTACATH PLACEMENT  2015  . REPLACEMENT TOTAL KNEE Left 2004-2005?    Family History  Problem Relation Age of Onset  . Cancer Father     prostate  . Cancer Mother     Pancreatic  . Cancer Maternal Aunt     breast  . Cancer Cousin     breast  . Cancer Other     lung cancer - neice  . Cancer - Other Daughter     brain    Social History:  reports that she quit smoking about 2 years ago. Her smoking use included Cigarettes. She has a 10.00 pack-year smoking history. She has never used smokeless tobacco. She reports that she does not drink alcohol or use drugs.  The patient is alone today.  Allergies:  Allergies  Allergen Reactions  . Fentanyl Other (See Comments)    "loopy and confused"    Current Medications: Current Outpatient Prescriptions  Medication Sig Dispense Refill  .  acetaminophen (TYLENOL) 500 MG tablet Take 1,000 mg by mouth every 8 (eight) hours as needed for headache.    . Calcium Carb-Cholecalciferol (CALCIUM-VITAMIN D3) 600-400 MG-UNIT TABS Take 1 tablet by mouth 2 (two) times daily.    Marland Kitchen loratadine (CLARITIN) 10 MG tablet Take 10 mg by mouth daily.    . Magnesium 250 MG TABS Take 1 each by mouth daily.     . pantoprazole (PROTONIX) 40 MG tablet Take 1 tablet (40 mg total) by mouth daily. 30 tablet 6  . phosphorus (PHOSPHA 250 NEUTRAL) 155-852-130 MG tablet Take 1 tablet (250 mg total) by mouth 2 (two) times daily. 60 tablet 2  . potassium chloride (K-DUR) 10 MEQ tablet Take 1 tablet (10 mEq total) by mouth daily. 30 tablet 2  . temazepam (RESTORIL) 15 MG capsule Take 1 capsule (15 mg total) by mouth at bedtime as needed for sleep. 30 capsule 0  . clonazePAM (KLONOPIN) 0.5 MG tablet Take 1 tablet (0.5 mg total) by mouth 3 (three) times daily as needed for anxiety. (Patient not taking: Reported on 02/03/2016) 90 tablet 2  . furosemide (LASIX) 20 MG tablet TAKE ONE TABLET BY MOUTH EVERY OTHER DAYAS NEEDED FOR SWELLING (Patient not taking: Reported on 02/03/2016) 30 tablet 1  . ondansetron (ZOFRAN) 4 MG tablet  Take 1 tablet (4 mg total) by mouth every 8 (eight) hours as needed for nausea or vomiting. (Patient not taking: Reported on 02/03/2016) 20 tablet 1  . oxycodone (OXY-IR) 5 MG capsule Take 1 tablet every 6 hours by mouth as needed for pain (Patient not taking: Reported on 02/03/2016) 60 capsule 0   No current facility-administered medications for this visit.     Review of Systems:  GENERAL:  Feels good.  No fevers or sweats.  Weight up 1 pound. PERFORMANCE STATUS (ECOG):  0 HEENT:  Slight vision changes.  No runny nose, sore throat, mouth sores or tenderness. Lungs: No shortness of breath.  Chronic cough.  No hemoptysis. Cardiac:  No chest pain, palpitations, orthopnea, or PND.  GI:  No nausea, vomiting, diarrhea, constipation, melena or hematochezia. GU:   No urgency, frequency, dysuria, or hematuria. Musculoskeletal:  No back pain.  No joint pain.  No muscle tenderness. Extremities:  No pain or swelling. Skin:  No rashes or skin changes. Neuro:  No headache, numbness or weakness, balance or coordination issues. Endocrine:  No diabetes, thyroid issues, hot flashes or night sweats. Psych:  Insomnia improved on Restoril.  No mood changes, depression or anxiety. Pain:  No focal pain.   Review of systems:  All other systems reviewed and found to be negative.   Physical Exam: Blood pressure (!) 145/83, pulse 88, temperature (!) 96.4 F (35.8 C), temperature source Tympanic, resp. rate 18, weight 115 lb 8.3 oz (52.4 kg). GENERAL:  Thin woman sitting comfortably in the exam room in no acute distress. MENTAL STATUS:  Alert and oriented to person, place and time. HEAD:  Short styled gray hair.  Normocephalic, atraumatic, face symmetric, no Cushingoid features. EYES: Hazel eyes.  Pupils equal round and reactive to light and accomodation.  No conjunctivitis or scleral icterus. ENT:  Oropharynx clear without lesion.  Tongue normal. Mucous membranes moist.  RESPIRATORY:  Clear to auscultation without rales, wheezes or rhonchi. CARDIOVASCULAR:  Regular rate and rhythm without murmur, rub or gallop.  No JVD. ABDOMEN:  Soft, non-tender, with active bowel sounds, and no hepatosplenomegaly.  No masses. SKIN:  No rashes, ulcers or lesions. EXTREMITIES: No edema, no skin discoloration or tenderness.  No palpable cords. LYMPH NODES: No palpable cervical, supraclavicular, axillary or inguinal adenopathy  NEUROLOGICAL: Unremarkable. PSYCH:  Appropriate.    Appointment on 02/03/2016  Component Date Value Ref Range Status  . WBC 02/03/2016 6.1  3.6 - 11.0 K/uL Final  . RBC 02/03/2016 3.74* 3.80 - 5.20 MIL/uL Final  . Hemoglobin 02/03/2016 12.7  12.0 - 16.0 g/dL Final  . HCT 02/03/2016 36.4  35.0 - 47.0 % Final  . MCV 02/03/2016 97.3  80.0 - 100.0 fL Final   . MCH 02/03/2016 33.8  26.0 - 34.0 pg Final  . MCHC 02/03/2016 34.8  32.0 - 36.0 g/dL Final  . RDW 02/03/2016 16.0* 11.5 - 14.5 % Final  . Platelets 02/03/2016 123* 150 - 440 K/uL Final  . Neutrophils Relative % 02/03/2016 76  % Final  . Neutro Abs 02/03/2016 4.6  1.4 - 6.5 K/uL Final  . Lymphocytes Relative 02/03/2016 13  % Final  . Lymphs Abs 02/03/2016 0.8* 1.0 - 3.6 K/uL Final  . Monocytes Relative 02/03/2016 9  % Final  . Monocytes Absolute 02/03/2016 0.5  0.2 - 0.9 K/uL Final  . Eosinophils Relative 02/03/2016 1  % Final  . Eosinophils Absolute 02/03/2016 0.1  0 - 0.7 K/uL Final  . Basophils Relative 02/03/2016 1  %  Final  . Basophils Absolute 02/03/2016 0.0  0 - 0.1 K/uL Final  . Sodium 02/03/2016 137  135 - 145 mmol/L Final  . Potassium 02/03/2016 3.6  3.5 - 5.1 mmol/L Final  . Chloride 02/03/2016 108  101 - 111 mmol/L Final  . CO2 02/03/2016 24  22 - 32 mmol/L Final  . Glucose, Bld 02/03/2016 93  65 - 99 mg/dL Final  . BUN 02/03/2016 15  6 - 20 mg/dL Final  . Creatinine, Ser 02/03/2016 0.64  0.44 - 1.00 mg/dL Final  . Calcium 02/03/2016 8.8* 8.9 - 10.3 mg/dL Final  . Total Protein 02/03/2016 8.0  6.5 - 8.1 g/dL Final  . Albumin 02/03/2016 3.5  3.5 - 5.0 g/dL Final  . AST 02/03/2016 47* 15 - 41 U/L Final  . ALT 02/03/2016 26  14 - 54 U/L Final  . Alkaline Phosphatase 02/03/2016 96  38 - 126 U/L Final  . Total Bilirubin 02/03/2016 1.1  0.3 - 1.2 mg/dL Final  . GFR calc non Af Amer 02/03/2016 >60  >60 mL/min Final  . GFR calc Af Amer 02/03/2016 >60  >60 mL/min Final   Comment: (NOTE) The eGFR has been calculated using the CKD EPI equation. This calculation has not been validated in all clinical situations. eGFR's persistently <60 mL/min signify possible Chronic Kidney Disease.   . Anion gap 02/03/2016 5  5 - 15 Final  . Magnesium 02/03/2016 1.8  1.7 - 2.4 mg/dL Final  . Bilirubin, Direct 02/03/2016 0.3  0.1 - 0.5 mg/dL Final    Assessment:  URIAH TRUEBA is a 66 y.o.  female with metastatic Her2/neu positive right breast cancer.  She initially presented in 03/2013 with an ulcerated breast mass and back pain.  Breast biopsy revealed lobular breast cancer.  Tumor was ER/PR positive and HER-2/neu positive. PET scan revealed bone metastasis. She received palliative radiation to T8 and T10 from 04/02-04/15/2015.  She declined chemotherapy. She initially began letrozole and Tykerb. Tykerb was discontinued after 1 dose. She received letrozole from 05/02/2013 until 01/07/2014 .  She received Herceptin from 06/02/2013 until 12/15/2013. She received fulvestrant monthly from 01/07/2014 until 05/27/2014.    She has received Xgeva monthly (03/31/2013 until 08/25/2014; last 12/09/2015).  She was switched to every 6 weeks Xgeva to coordinate with her treatment.  Herceptin was discontinued for a rising CA-27-29.  CA27.29 was 916.3 on 03/19/2013, 286.2 on 06/23/2013, 70.8 on 08/25/2013, 56.3 on 10/08/2013, 102.7 on 12/15/2013, 149.4 on 01/07/2014, 178.0 on 02/04/2014, 190.7 on 03/30/2014, 143.2 on 04/22/2014, 134.8 on 05/20/2014, 248.3 on 07/09/2014, 723.2 on 08/25/2014, 252.3 on 10/08/2014, 126 on 10/29/2014, 71.3 on 11/24/2014, 55 on 12/15/2014, 70.6 on 01/05/2015, 48.6 on 01/26/2015, 52.7 on 02/15/2015, 54.7 on 03/30/2015, 54 on 04/20/2015, 55.2 on 06/01/2015, 48.8 on 06/22/2015, 49.7 08/03/2015, 44.6 on 08/24/2015, 44.5 on 09/16/2015, 44.8 on 10/07/2015, 52.1 on 10/28/2015, 47.0 on 11/18/2015, 49.9 on 12/09/2015, 46.7 on 12/30/2015, and 53.3 on 02/03/2016.   She received Ibrance and Faslodex from 02/23/2014 until 07/27/2014.    Echo on 09/04/2014 revealed an EF of 55-65%.  Echo on 12/14/2014, 03/15/2015, 06/15/2015 revealed an EF of 55-60%, 50% on 09/23/2015, and 60-65% on 11/16/2015.   PET scan on 08/28/2014 revealed significant interval worsening and multifocal osseous metastatic disease. Lesions had increased in number and metabolic activity. There were no extra osseous  metastasis.  She is s/p 23 cycles of Kadcyla (09/10/2014 - 12/30/2015).  She is tolerating treatment well.  She denies any bone pain.   PET scan on  02/08/2015 revealed a complete metabolic response in the sclerotic osseous metastases, with no residual hypermetabolic metastatic disease.  PET scan on 08/23/2015 revealed no new or progressive hypermetabolic metastatic disease.   Bone scan on 07/30/2015 revealed multifocal osseous metastases (sternum, thoracolumbar spine, multiple ribs, and right iliac crest).  The dominant left acetabular metastasis noted on prior PET was not evident. Some of the other sites of disease appear to have progressed.  There was no prior bone scan for comparison.  PET scan on 08/23/2015 revealed no new or progressive hypermetabolic disease.  There was stable mildly hypermetabolic sclerotic metastasis in the right proximal femoral metaphysis.  There was stable nonspecific mild patchy hypermetabolism in the upper and lower spine correlating with mixed lytic and sclerotic osseous metastases in the CT images  She was admitted from 12/22/2014 - 12/24/2014 with E coli sepsis secondary to a UTI.  Serum protein is elevated.  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  Symptomatically, she feels good.  Exam is stable.  She has mild thrombocytopenia (platelets 123,000).   She has stable mild hyperbilirubinemia (1.1; direct fraction 0.3).  She has mild hypocalcemia (8.8).  Potassium (3.6) and magnesium (1.8) are normal.  Plan: 1.  Labs today:  CBC with diff, CMP, Mg, CA27.29. 2.  Cycle #24 Kadcyla. 3.  No Xgeva today secondary to hypocalcemia. 4.  Schedule bone scan on 02/16/2016. 5.  Schedule echo on 02/16/2016. 6.  Increase calcium from 600 mg po BID to TID.  7.  Continue potassium 10 meq q day. 8.  Continue magnesium 1 pill a day. 9.  RTC in 3 weeks for MD assessment, labs (CBC with diff, CMP, Mg, CA27.29), review of echo and bone scan, Xgeva, and cycle #25 Kadcyla  (T-DM1)   Lequita Asal, MD  02/03/2016, 10:34 AM

## 2016-02-04 LAB — CANCER ANTIGEN 27.29: CA 27.29: 53.3 U/mL — ABNORMAL HIGH (ref 0.0–38.6)

## 2016-02-16 ENCOUNTER — Ambulatory Visit
Admission: RE | Admit: 2016-02-16 | Discharge: 2016-02-16 | Disposition: A | Discharge status: Home / Self Care | Payer: Medicare Other | Source: Ambulatory Visit | Attending: Hematology and Oncology | Admitting: Hematology and Oncology

## 2016-02-16 ENCOUNTER — Encounter
Admission: RE | Admit: 2016-02-16 | Discharge: 2016-02-16 | Disposition: A | Discharge status: Home / Self Care | Payer: Medicare Other | Source: Ambulatory Visit | Attending: Hematology and Oncology | Admitting: Hematology and Oncology

## 2016-02-16 DIAGNOSIS — C50911 Malignant neoplasm of unspecified site of right female breast: Secondary | ICD-10-CM | POA: Diagnosis not present

## 2016-02-16 DIAGNOSIS — I351 Nonrheumatic aortic (valve) insufficiency: Secondary | ICD-10-CM | POA: Insufficient documentation

## 2016-02-16 DIAGNOSIS — Z853 Personal history of malignant neoplasm of breast: Secondary | ICD-10-CM | POA: Diagnosis not present

## 2016-02-16 DIAGNOSIS — Z170 Estrogen receptor positive status [ER+]: Secondary | ICD-10-CM

## 2016-02-16 DIAGNOSIS — C7951 Secondary malignant neoplasm of bone: Secondary | ICD-10-CM | POA: Diagnosis not present

## 2016-02-16 DIAGNOSIS — J449 Chronic obstructive pulmonary disease, unspecified: Secondary | ICD-10-CM | POA: Insufficient documentation

## 2016-02-16 DIAGNOSIS — I340 Nonrheumatic mitral (valve) insufficiency: Secondary | ICD-10-CM | POA: Insufficient documentation

## 2016-02-16 DIAGNOSIS — I272 Pulmonary hypertension, unspecified: Secondary | ICD-10-CM | POA: Insufficient documentation

## 2016-02-16 DIAGNOSIS — I071 Rheumatic tricuspid insufficiency: Secondary | ICD-10-CM | POA: Diagnosis not present

## 2016-02-16 MED ORDER — TECHNETIUM TC 99M MEDRONATE IV KIT
20.5590 | PACK | Freq: Once | INTRAVENOUS | Status: AC | PRN
  Administered 2016-02-16: 20.559 via INTRAVENOUS

## 2016-02-16 NOTE — Progress Notes (Signed)
*  PRELIMINARY RESULTS* Echocardiogram 2D Echocardiogram has been performed.  Sherrie Sport 02/16/2016, 9:43 AM

## 2016-02-24 ENCOUNTER — Inpatient Hospital Stay: Payer: Medicare Other

## 2016-02-24 ENCOUNTER — Telehealth: Payer: Self-pay | Admitting: *Deleted

## 2016-02-24 ENCOUNTER — Inpatient Hospital Stay (HOSPITAL_BASED_OUTPATIENT_CLINIC_OR_DEPARTMENT_OTHER): Payer: Medicare Other | Admitting: Hematology and Oncology

## 2016-02-24 ENCOUNTER — Ambulatory Visit
Admission: RE | Admit: 2016-02-24 | Discharge: 2016-02-24 | Disposition: A | Discharge status: Home / Self Care | Payer: Medicare Other | Source: Ambulatory Visit | Attending: Hematology and Oncology | Admitting: Hematology and Oncology

## 2016-02-24 VITALS — BP 146/83 | HR 102 | Temp 99.8°F | Resp 18 | Wt 115.3 lb

## 2016-02-24 DIAGNOSIS — D890 Polyclonal hypergammaglobulinemia: Secondary | ICD-10-CM | POA: Diagnosis not present

## 2016-02-24 DIAGNOSIS — C7951 Secondary malignant neoplasm of bone: Secondary | ICD-10-CM | POA: Insufficient documentation

## 2016-02-24 DIAGNOSIS — Z170 Estrogen receptor positive status [ER+]: Principal | ICD-10-CM

## 2016-02-24 DIAGNOSIS — M25511 Pain in right shoulder: Secondary | ICD-10-CM

## 2016-02-24 DIAGNOSIS — Z5112 Encounter for antineoplastic immunotherapy: Secondary | ICD-10-CM | POA: Diagnosis not present

## 2016-02-24 DIAGNOSIS — Z79899 Other long term (current) drug therapy: Secondary | ICD-10-CM

## 2016-02-24 DIAGNOSIS — D696 Thrombocytopenia, unspecified: Secondary | ICD-10-CM | POA: Diagnosis not present

## 2016-02-24 DIAGNOSIS — C50911 Malignant neoplasm of unspecified site of right female breast: Secondary | ICD-10-CM | POA: Diagnosis not present

## 2016-02-24 DIAGNOSIS — R17 Unspecified jaundice: Secondary | ICD-10-CM

## 2016-02-24 DIAGNOSIS — M19011 Primary osteoarthritis, right shoulder: Secondary | ICD-10-CM | POA: Diagnosis not present

## 2016-02-24 LAB — COMPREHENSIVE METABOLIC PANEL
ALT: 30 U/L (ref 14–54)
AST: 53 U/L — ABNORMAL HIGH (ref 15–41)
Albumin: 3.6 g/dL (ref 3.5–5.0)
Alkaline Phosphatase: 85 U/L (ref 38–126)
Anion gap: 8 (ref 5–15)
BUN: 14 mg/dL (ref 6–20)
CO2: 23 mmol/L (ref 22–32)
Calcium: 9.4 mg/dL (ref 8.9–10.3)
Chloride: 104 mmol/L (ref 101–111)
Creatinine, Ser: 0.62 mg/dL (ref 0.44–1.00)
GFR calc Af Amer: >60 mL/min (ref >60)
GFR calc non Af Amer: >60 mL/min (ref >60)
Glucose, Bld: 106 mg/dL — ABNORMAL HIGH (ref 65–99)
Potassium: 3.7 mmol/L (ref 3.5–5.1)
Sodium: 135 mmol/L (ref 135–145)
Total Bilirubin: 1.8 mg/dL — ABNORMAL HIGH (ref 0.3–1.2)
Total Protein: 8.8 g/dL — ABNORMAL HIGH (ref 6.5–8.1)

## 2016-02-24 LAB — CBC WITH DIFFERENTIAL/PLATELET
Basophils Absolute: 0 10*3/uL (ref 0–0.1)
Basophils Relative: 0 %
Eosinophils Absolute: 0 10*3/uL (ref 0–0.7)
Eosinophils Relative: 1 %
HCT: 36.5 % (ref 35.0–47.0)
Hemoglobin: 13 g/dL (ref 12.0–16.0)
Lymphocytes Relative: 11 %
Lymphs Abs: 0.9 10*3/uL — ABNORMAL LOW (ref 1.0–3.6)
MCH: 34.1 pg — ABNORMAL HIGH (ref 26.0–34.0)
MCHC: 35.6 g/dL (ref 32.0–36.0)
MCV: 95.9 fL (ref 80.0–100.0)
Monocytes Absolute: 1 10*3/uL — ABNORMAL HIGH (ref 0.2–0.9)
Monocytes Relative: 11 %
Neutro Abs: 6.7 10*3/uL — ABNORMAL HIGH (ref 1.4–6.5)
Neutrophils Relative %: 77 %
Platelets: 205 10*3/uL (ref 150–440)
RBC: 3.8 MIL/uL (ref 3.80–5.20)
RDW: 15 % — ABNORMAL HIGH (ref 11.5–14.5)
WBC: 8.7 10*3/uL (ref 3.6–11.0)

## 2016-02-24 LAB — MAGNESIUM: Magnesium: 1.8 mg/dL (ref 1.7–2.4)

## 2016-02-24 LAB — BILIRUBIN, DIRECT: Bilirubin, Direct: 0.3 mg/dL (ref 0.1–0.5)

## 2016-02-24 MED ORDER — HEPARIN SOD (PORK) LOCK FLUSH 100 UNIT/ML IV SOLN
500.0000 [IU] | Freq: Once | INTRAVENOUS | Status: AC
  Administered 2016-02-24: 500 [IU] via INTRAVENOUS
  Filled 2016-02-24: qty 5

## 2016-02-24 MED ORDER — DENOSUMAB 120 MG/1.7ML ~~LOC~~ SOLN
120.0000 mg | Freq: Once | SUBCUTANEOUS | Status: AC
  Administered 2016-02-24: 120 mg via SUBCUTANEOUS
  Filled 2016-02-24: qty 1.7

## 2016-02-24 NOTE — Progress Notes (Signed)
Elizabeth Bond day:  02/25/16  Chief Complaint: Elizabeth Bond is a 66 y.o. female with metastatic Her2/neu positive right breast cancer who is seen for assessment prior to cycle #25 Kadcyla.  HPI:  The patient was last seen in the medical oncology [facility] on 02/03/2016.  At that time, she was doing well. She had mild thrombocytopenia (platelets 123,000).   Bilirubin was 1.1.  Potassium and magnesium were normal on supplementation.  CA27.29 was 53.3.  She received cycle #24 Kadcyla.   Bone scan on 02/16/2016 revealed multiple osseous metastasis with distribution of metastasis similar to that seen on the previous exam.  Echo on 02/16/2016 revealed an EF of 60-65%.  During the interim, she has felt good. On Monday she ran into her refrigerator bumping her left arm and heard a "pop" in her right arm. She denies directly injuring the right arm. The right arm has become progressively harder to move and more painful. Today, she is unable to move the right arm without the help of her left. She states the pain is 9/10 in right arm. She has not tried any pain medications and has not seen her PCP for this problem. She was unable to sleep last night due to the pain.   She denies fevers, chills or SOB. She has a sinus infection that she has had on and off for the past few weeks. She is taking Claritin.    Past Medical History:  Diagnosis Date  . Breast cancer ([place])    right  . Cancer ([place]) 2015   breast and bone  . COPD (chronic obstructive pulmonary disease) ([place])   . IBS (irritable bowel syndrome)   . Metastasis to bone ([place]) 09/01/2014    Past Surgical History:  Procedure Laterality Date  . ABDOMINAL HYSTERECTOMY  1978  . PORTACATH PLACEMENT  2015  . REPLACEMENT TOTAL KNEE Left 2004-2005?    Family History  Problem Relation Age of Onset  . Cancer Father     prostate  . Cancer Mother     Pancreatic  . Cancer Maternal Aunt     breast  . Cancer  Cousin     breast  . Cancer Other     lung cancer - neice  . Cancer - Other Daughter     brain    Social History:  reports that she quit smoking about 2 years ago. Her smoking use included Cigarettes. She has a 10.00 pack-year smoking history. She has never used smokeless tobacco. She reports that she does not drink alcohol or use drugs.  The patient is accompanied by her daughter, Baruch Gouty, today.  Allergies:  Allergies  Allergen Reactions  . Fentanyl Other (See Comments)    "loopy and confused"    Current Medications: Current Outpatient Prescriptions  Medication Sig Dispense Refill  . acetaminophen (TYLENOL) 500 MG tablet Take 1,000 mg by mouth every 8 (eight) hours as needed for headache.    . Calcium Carb-Cholecalciferol (CALCIUM-VITAMIN D3) 600-400 MG-UNIT TABS Take 1 tablet by mouth 2 (two) times daily.    Marland Kitchen loratadine (CLARITIN) 10 MG tablet Take 10 mg by mouth daily.    . Magnesium 250 MG TABS Take 1 each by mouth daily.     . pantoprazole (PROTONIX) 40 MG tablet Take 1 tablet (40 mg total) by mouth daily. 30 tablet 6  . phosphorus (PHOSPHA 250 NEUTRAL) 155-852-130 MG tablet Take 1 tablet (250 mg total) by mouth 2 (two) times daily. [place]  tablet 2  . potassium chloride (K-DUR) 10 MEQ tablet Take 1 tablet (10 mEq total) by mouth daily. 30 tablet 2  . temazepam (RESTORIL) 15 MG capsule Take 1 capsule (15 mg total) by mouth at bedtime as needed for sleep. 30 capsule 0  . clonazePAM (KLONOPIN) 0.5 MG tablet Take 1 tablet (0.5 mg total) by mouth 3 (three) times daily as needed for anxiety. (Patient not taking: Reported on 02/03/2016) 90 tablet 2  . furosemide (LASIX) 20 MG tablet TAKE ONE TABLET BY MOUTH EVERY OTHER DAYAS NEEDED FOR SWELLING (Patient not taking: Reported on 02/03/2016) 30 tablet 1  . ondansetron (ZOFRAN) 4 MG tablet Take 1 tablet (4 mg total) by mouth every 8 (eight) hours as needed for nausea or vomiting. (Patient not taking: Reported on 02/03/2016) 20 tablet 1  . oxycodone  (OXY-IR) 5 MG capsule Take 1 tablet every 6 hours by mouth as needed for pain (Patient not taking: Reported on 02/03/2016) 60 capsule 0   No current facility-administered medications for this visit.     Review of Systems:  GENERAL:  Feels "fine except for arm".  No fevers or sweats.  Weight stable. PERFORMANCE STATUS (ECOG):  0 HEENT:  No visual changes, runny nose, sore throat, mouth sores or tenderness. Lungs: No shortness of breath.  Chronic cough.  No hemoptysis. Cardiac:  No chest pain, palpitations, orthopnea, or PND.  Blood pressure up as worried about scans GI:  No nausea, vomiting, diarrhea, constipation, melena or hematochezia. GU:  No urgency, frequency, dysuria, or hematuria. Musculoskeletal:  Right arm/shoulder pain (see HPI).  No back pain.  No joint pain.  No muscle tenderness. Extremities:  Unable to move right arm much secondary to pain.  No swelling. Skin:  No rashes or skin changes. Neuro:  No headache, numbness or weakness, balance or coordination issues. Endocrine:  No diabetes, thyroid issues, hot flashes or night sweats. Psych:  Insomnia improved on Restoril.  No mood changes, depression or anxiety. Pain:  No focal pain.   Review of systems:  All other systems reviewed and found to be negative.   Physical Exam: Blood pressure (!) 146/83, pulse (!) 102, temperature 99.8 F (37.7 C), temperature source Tympanic, resp. rate 18, weight 115 lb 4.8 oz (52.3 kg). GENERAL:  Thin woman sitting comfortably in the exam room in no acute distress. MENTAL STATUS:  Alert and oriented to person, place and time. HEAD:  Short styled gray hair.  Normocephalic, atraumatic, face symmetric, no Cushingoid features. EYES: Hazel eyes.  Pupils equal round and reactive to light and accomodation.  No conjunctivitis or scleral icterus. ENT:  Oropharynx clear without lesion.  Tongue normal. Mucous membranes moist.  RESPIRATORY:  Clear to auscultation without rales, wheezes or  rhonchi. CARDIOVASCULAR:  Regular rate and rhythm without murmur, rub or gallop.  No JVD. ABDOMEN:  Soft, non-tender, with active bowel sounds, and no hepatosplenomegaly.  No masses. SKIN:  No rashes, ulcers or lesions. EXTREMITIES: Right upper arm with warm and mild edema associated with shoulder.  No skin discoloration or tenderness.  No palpable cords. LYMPH NODES: No palpable cervical, supraclavicular, axillary or inguinal adenopathy  NEUROLOGICAL: Unremarkable. PSYCH:  Appropriate.    Appointment on 02/24/2016  Component Date Value Ref Range Status  . Sodium 02/24/2016 135  135 - 145 mmol/L Final  . Potassium 02/24/2016 3.7  3.5 - 5.1 mmol/L Final  . Chloride 02/24/2016 104  101 - 111 mmol/L Final  . CO2 02/24/2016 23  22 - 32 mmol/L Final  .  Glucose, Bld 02/24/2016 106* 65 - 99 mg/dL Final  . BUN 02/24/2016 14  6 - 20 mg/dL Final  . Creatinine, Ser 02/24/2016 0.62  0.44 - 1.00 mg/dL Final  . Calcium 02/24/2016 9.4  8.9 - 10.3 mg/dL Final  . Total Protein 02/24/2016 8.8* 6.5 - 8.1 g/dL Final  . Albumin 02/24/2016 3.6  3.5 - 5.0 g/dL Final  . AST 02/24/2016 53* 15 - 41 U/L Final  . ALT 02/24/2016 30  14 - 54 U/L Final  . Alkaline Phosphatase 02/24/2016 85  38 - 126 U/L Final  . Total Bilirubin 02/24/2016 1.8* 0.3 - 1.2 mg/dL Final  . GFR calc non Af Amer 02/24/2016 >60  >60 mL/min Final  . GFR calc Af Amer 02/24/2016 >60  >60 mL/min Final   Comment: (NOTE) The eGFR has been calculated using the CKD EPI equation. This calculation has not been validated in all clinical situations. eGFR's persistently <60 mL/min signify possible Chronic Kidney Disease.   . Anion gap 02/24/2016 8  5 - 15 Final  . WBC 02/24/2016 8.7  3.6 - 11.0 K/uL Final  . RBC 02/24/2016 3.80  3.80 - 5.20 MIL/uL Final  . Hemoglobin 02/24/2016 13.0  12.0 - 16.0 g/dL Final  . HCT 02/24/2016 36.5  35.0 - 47.0 % Final  . MCV 02/24/2016 95.9  80.0 - 100.0 fL Final  . MCH 02/24/2016 34.1* 26.0 - 34.0 pg Final  .  MCHC 02/24/2016 35.6  32.0 - 36.0 g/dL Final  . RDW 02/24/2016 15.0* 11.5 - 14.5 % Final  . Platelets 02/24/2016 205  150 - 440 K/uL Final  . Neutrophils Relative % 02/24/2016 77  % Final  . Neutro Abs 02/24/2016 6.7* 1.4 - 6.5 K/uL Final  . Lymphocytes Relative 02/24/2016 11  % Final  . Lymphs Abs 02/24/2016 0.9* 1.0 - 3.6 K/uL Final  . Monocytes Relative 02/24/2016 11  % Final  . Monocytes Absolute 02/24/2016 1.0* 0.2 - 0.9 K/uL Final  . Eosinophils Relative 02/24/2016 1  % Final  . Eosinophils Absolute 02/24/2016 0.0  0 - 0.7 K/uL Final  . Basophils Relative 02/24/2016 0  % Final  . Basophils Absolute 02/24/2016 0.0  0 - 0.1 K/uL Final  . Magnesium 02/24/2016 1.8  1.7 - 2.4 mg/dL Final  . CA 27.29 02/24/2016 49.3* 0.0 - 38.6 U/mL Final   Comment: (NOTE) Bayer Cent[facility]/ACS methodology Performed At: [facility] [place], [place] [id number] Lindon Romp MD BJ:[id number]   . Bilirubin, Direct 02/24/2016 0.3  0.1 - 0.5 mg/dL Final    Assessment:  Elizabeth Bond is a 66 y.o. female with metastatic Her2/neu positive right breast cancer.  She initially presented in 03/2013 with an ulcerated breast mass and back pain.  Breast biopsy revealed lobular breast cancer.  Tumor was ER/PR positive and HER-2/neu positive. PET scan revealed bone metastasis. She received palliative radiation to T8 and T10 from 04/02-04/15/2015.  She declined chemotherapy. She initially began letrozole and Tykerb. Tykerb was discontinued after 1 dose. She received letrozole from 05/02/2013 until 01/07/2014 .  She received Herceptin from 06/02/2013 until 12/15/2013. She received fulvestrant monthly from 01/07/2014 until 05/27/2014.    She has received Xgeva monthly (03/31/2013 until 08/25/2014; last 12/09/2015).  She was switched to every 6 weeks Xgeva to coordinate with her treatment.  Herceptin was discontinued for a rising CA-27-29.  CA27.29 was 916.3 on 03/19/2013, 286.2 on 06/23/2013, 70.8 on  08/25/2013, 56.3 on 10/08/2013, 102.7 on 12/15/2013, 149.4 on 01/07/2014, 178.0  on 02/04/2014, 190.7 on 03/30/2014, 143.2 on 04/22/2014, 134.8 on 05/20/2014, 248.3 on 07/09/2014, 723.2 on 08/25/2014, 252.3 on 10/08/2014, 126 on 10/29/2014, 71.3 on 11/24/2014, 55 on 12/15/2014, 70.6 on 01/05/2015, 48.6 on 01/26/2015, 52.7 on 02/15/2015, 54.7 on 03/30/2015, 54 on 04/20/2015, 55.2 on 06/01/2015, 48.8 on 06/22/2015, 49.7 08/03/2015, 44.6 on 08/24/2015, 44.5 on 09/16/2015, 44.8 on 10/07/2015, 52.1 on 10/28/2015, 47.0 on 11/18/2015, 49.9 on 12/09/2015, 46.7 on 12/30/2015, 53.3 on 02/03/2016 and 49.3 on 02/24/2016.  She received Ibrance and Faslodex from 02/23/2014 until 07/27/2014.    Echo on 09/04/2014 revealed an EF of 55-65%.  Echo on 12/14/2014, 03/15/2015, 06/15/2015 revealed an EF of 55-60%, 50% on 09/23/2015, 60-65% on 11/16/2015, and 60-65% on 02/16/2016.   PET scan on 08/28/2014 revealed significant interval worsening and multifocal osseous metastatic disease. Lesions had increased in number and metabolic activity. There were no extra osseous metastasis.  She is s/p 24 cycles of Kadcyla (09/10/2014 - 02/03/2016).  She is tolerating treatment well.  She denies any bone pain.   PET scan on 02/08/2015 revealed a complete metabolic response in the sclerotic osseous metastases, with no residual hypermetabolic metastatic disease.  PET scan on 08/23/2015 revealed no new or progressive hypermetabolic metastatic disease.   Bone scan on 07/30/2015 revealed multifocal osseous metastases (sternum, thoracolumbar spine, multiple ribs, and right iliac crest).  The dominant left acetabular metastasis noted on prior PET was not evident. Some of the other sites of disease appear to have progressed.  There was no prior bone scan for comparison.  PET scan on 08/23/2015 revealed no new or progressive hypermetabolic disease.  There was stable mildly hypermetabolic sclerotic metastasis in the right proximal femoral  metaphysis.  There was stable nonspecific mild patchy hypermetabolism in the upper and lower spine correlating with mixed lytic and sclerotic osseous metastases in the CT images  Bone scan on 02/16/2016 revealed multiple osseous metastasis with distribution of metastasis similar to that seen on the previous exam.  She was admitted from 12/22/2014 - 12/24/2014 with E coli sepsis secondary to a UTI.  Serum protein is elevated.  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  She has intermittent electrolytes issues (hypokalemia, hypomagnesemia, and hypocalcemia).  She is on supplementation.  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  Symptomatically, she feels well other than the pain in her right arm/shoulder. Exam reveals a mildly swollen right shoulder with decreased mobility. Bilirubin is elevated (1.8).  Potassium, magnesium, and calcium are normal on supplementation.   Plan: 1.  Labs today:  CBC with diff, CMP, Mg, CA27.29. 2.  Review bone scan.  Patient has stable disease.  Review echo.  EF is stable. 3.  No Kadcyla today. Elevated bilirubin (1.8).  Check direct bilirubin.  Likely secondary to trauma/stress with Gilbert's disease. 4.  Xgeva today. 5.  Right arm pain: Plain films right upper extremity/shoulder.  If negative, consult orthopedics. 6.  Continue potassium 10 meq q day. 7.  Continue magnesium 1 pill a day. 8.  Discuss pain management for acute arm pain.  Patient only wants to take Tylenol. 9.  RTC after plain films. 10.  RTC in 1 week for MD assessment, labs (CBC with diff, CMP, direct bilirubin), and Kadcyla.  The patient was seen and examined.  The assessment and plan was discussed with the patient.  Multiple questions were asked and answered.   Addendum:  Plain films revealed no evidence of fracture.  Dr Rudene Christians was contacted.  His office will contact her for consultation.  Faythe Casa, NP  Lequita Asal, MD   02/25/2016, 1:37 PM

## 2016-02-24 NOTE — Telephone Encounter (Signed)
Called patient's daughter to inform her that x-ray of patient's arm shows no fractures.  We will call orthopedist this afternoon and try to get an appointment to get her in to be seen.  Verbalized understanding.

## 2016-02-24 NOTE — Progress Notes (Signed)
Patient states she hurt her right arm on Monday while walking around her grandson.  She states she heard something pop.  Unable to lift arm today.  Patient has temp today 99.8 in right ear and 100.2 in left ear.  States she had chills yesterday and last night.  Did not sleep well last night because of arm.

## 2016-02-25 LAB — CANCER ANTIGEN 27.29: CA 27.29: 49.3 U/mL — ABNORMAL HIGH (ref 0.0–38.6)

## 2016-02-26 ENCOUNTER — Encounter: Payer: Self-pay | Admitting: Hematology and Oncology

## 2016-02-26 DIAGNOSIS — R17 Unspecified jaundice: Secondary | ICD-10-CM | POA: Insufficient documentation

## 2016-03-01 ENCOUNTER — Other Ambulatory Visit: Payer: Self-pay | Admitting: Oncology

## 2016-03-01 NOTE — Progress Notes (Signed)
Elizabeth Bond is a 65 y.o. female with metastatic Her2/neu positive right breast cancer who is seen for assessment prior to cycle #25 Kadcyla.  HPI:  She continues to do well and is tolerating her treatments without a problem. She has no neurologic complaints.  She denies any fevers.  She has a good appetite and denies weigh loss.  She denies any chest pain or shortness of breath.  She denies any nausea, vomiting, constipation, or diarrhea.  She has no urinary complaint.  She denies any complaints today.      Past Medical History:  Diagnosis Date  . Breast cancer ([place])    right  . Cancer ([place]) 2015   breast and bone  . COPD (chronic obstructive pulmonary disease) ([place])   . IBS (irritable bowel syndrome)   . 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 ABDOMINAL HYSTERECTOMY  1978  . PORTACATH PLACEMENT  2015  . REPLACEMENT TOTAL KNEE Left 2004-2005?    Family History  Problem Relation Age of Onset  . Cancer Father     prostate  . Cancer Mother     Pancreatic  . Cancer Maternal Aunt     breast  . Cancer Cousin     breast  . Cancer Other     lung cancer - neice  . Cancer - Other Daughter     brain    Social History:  reports that she quit smoking about 2 years ago. Her smoking use included Cigarettes. She has a 10.00 pack-year smoking history. She has never used smokeless tobacco. She reports that she does not drink alcohol or use drugs.  The patient is alone today.  Allergies:  Allergies  Allergen Reactions  . Fentanyl Other (See Comments)    "loopy and confused"    Current Medications: Current Outpatient Prescriptions  Medication Sig Dispense Refill  . acetaminophen (TYLENOL) 500 MG tablet Take 1,000 mg by mouth every 8 (eight) hours as needed for headache.    . Calcium Carb-Cholecalciferol (CALCIUM-VITAMIN D3) 600-400 MG-UNIT  TABS Take 1 tablet by mouth 2 (two) times daily.    Marland Kitchen loratadine (CLARITIN) 10 MG tablet Take 10 mg by mouth daily.    . Magnesium 250 MG TABS Take 1 each by mouth daily.     . pantoprazole (PROTONIX) 40 MG tablet Take 1 tablet (40 mg total) by mouth daily. 30 tablet 6  . phosphorus (PHOSPHA 250 NEUTRAL) 155-852-130 MG tablet Take 1 tablet (250 mg total) by mouth 2 (two) times daily. 60 tablet 2  . potassium chloride (K-DUR) 10 MEQ tablet Take 1 tablet (10 mEq total) by mouth daily. 30 tablet 2  . temazepam (RESTORIL) 15 MG capsule Take 1 capsule (15 mg total) by mouth at bedtime as needed for sleep. 30 capsule 0  . clonazePAM (KLONOPIN) 0.5 MG tablet Take 1 tablet (0.5 mg total) by mouth 3 (three) times daily as needed for anxiety. (Patient not taking: Reported on 02/03/2016) 90 tablet 2  . furosemide (LASIX) 20 MG tablet TAKE ONE TABLET BY MOUTH EVERY OTHER DAYAS NEEDED FOR SWELLING (Patient not taking: Reported on 02/03/2016) 30 tablet 1  . ondansetron (ZOFRAN) 4 MG tablet Take 1 tablet (4 mg total) by mouth every 8 (eight) hours as needed for nausea or vomiting. (Patient not taking: Reported on 02/03/2016) 20 tablet 1  . oxycodone (OXY-IR) 5  MG capsule Take 1 tablet every 6 hours by mouth as needed for pain (Patient not taking: Reported on 02/03/2016) 60 capsule 0   No current facility-administered medications for this visit.     Review of Systems:  GENERAL:  Feels good.  No fevers or sweats. PERFORMANCE STATUS (ECOG):  0 HEENT:  Slight vision changes.  No runny nose, sore throat, mouth sores or tenderness. Lungs: No shortness of breath.  Chronic cough.  No hemoptysis. Cardiac:  No chest pain, palpitations, orthopnea, or PND.  GI:  No nausea, vomiting, diarrhea, constipation, melena or hematochezia. GU:  No urgency, frequency, dysuria, or hematuria. Musculoskeletal:  No back pain.  No joint pain.  No muscle tenderness. Extremities:  No pain or swelling. Skin:  No rashes or skin changes. Neuro:   No headache, numbness or weakness, balance or coordination issues. Endocrine:  No diabetes, thyroid issues, hot flashes or night sweats. Psych:  Insomnia improved on Restoril.  No mood changes, depression or anxiety. Pain:  No focal pain.   Review of systems:  All other systems reviewed and found to be negative.   Physical Exam: Blood pressure (!) 172/91, pulse 95, temperature 98 F (36.7 C), temperature source Tympanic, weight 115 lb 4.8 oz (52.3 kg). GENERAL:  Thin woman sitting comfortably in the exam room in no acute distress. MENTAL STATUS:  Alert and oriented to person, place and time. HEAD:  Short styled gray hair.  Normocephalic, atraumatic, face symmetric, no Cushingoid features. EYES: Hazel eyes.  Pupils equal round and reactive to light and accomodation.  No conjunctivitis or scleral icterus. ENT:  Oropharynx clear without lesion.  Tongue normal. Mucous membranes moist.  RESPIRATORY:  Clear to auscultation without rales, wheezes or rhonchi. CARDIOVASCULAR:  Regular rate and rhythm without murmur, rub or gallop.  No JVD. ABDOMEN:  Soft, non-tender, with active bowel sounds, and no hepatosplenomegaly.  No masses. SKIN:  No rashes, ulcers or lesions. EXTREMITIES: No edema, no skin discoloration or tenderness.  No palpable cords. LYMPH NODES: No palpable cervical, supraclavicular, axillary or inguinal adenopathy  NEUROLOGICAL: Unremarkable. PSYCH:  Appropriate.    Infusion on 03/02/2016  Component Date Value Ref Range Status  . WBC 03/02/2016 5.9  3.6 - 11.0 K/uL Final  . RBC 03/02/2016 3.81  3.80 - 5.20 MIL/uL Final  . Hemoglobin 03/02/2016 13.0  12.0 - 16.0 g/dL Final  . HCT 03/02/2016 36.6  35.0 - 47.0 % Final  . MCV 03/02/2016 96.2  80.0 - 100.0 fL Final  . MCH 03/02/2016 34.2* 26.0 - 34.0 pg Final  . MCHC 03/02/2016 35.5  32.0 - 36.0 g/dL Final  . RDW 03/02/2016 15.2* 11.5 - 14.5 % Final  . Platelets 03/02/2016 248  150 - 440 K/uL Final  . Neutrophils Relative %  03/02/2016 72  % Final  . Neutro Abs 03/02/2016 4.3  1.4 - 6.5 K/uL Final  . Lymphocytes Relative 03/02/2016 18  % Final  . Lymphs Abs 03/02/2016 1.1  1.0 - 3.6 K/uL Final  . Monocytes Relative 03/02/2016 8  % Final  . Monocytes Absolute 03/02/2016 0.4  0.2 - 0.9 K/uL Final  . Eosinophils Relative 03/02/2016 1  % Final  . Eosinophils Absolute 03/02/2016 0.1  0 - 0.7 K/uL Final  . Basophils Relative 03/02/2016 1  % Final  . Basophils Absolute 03/02/2016 0.0  0 - 0.1 K/uL Final  . Sodium 03/02/2016 138  135 - 145 mmol/L Final  . Potassium 03/02/2016 3.7  3.5 - 5.1 mmol/L Final  .  Chloride 03/02/2016 101  101 - 111 mmol/L Final  . CO2 03/02/2016 29  22 - 32 mmol/L Final  . Glucose, Bld 03/02/2016 112* 65 - 99 mg/dL Final  . BUN 03/02/2016 17  6 - 20 mg/dL Final  . Creatinine, Ser 03/02/2016 0.57  0.44 - 1.00 mg/dL Final  . Calcium 03/02/2016 10.9* 8.9 - 10.3 mg/dL Final  . Total Protein 03/02/2016 8.5* 6.5 - 8.1 g/dL Final  . Albumin 03/02/2016 3.4* 3.5 - 5.0 g/dL Final  . AST 03/02/2016 56* 15 - 41 U/L Final  . ALT 03/02/2016 32  14 - 54 U/L Final  . Alkaline Phosphatase 03/02/2016 96  38 - 126 U/L Final  . Total Bilirubin 03/02/2016 1.0  0.3 - 1.2 mg/dL Final  . GFR calc non Af Amer 03/02/2016 >60  >60 mL/min Final  . GFR calc Af Amer 03/02/2016 >60  >60 mL/min Final   Comment: (NOTE) The eGFR has been calculated using the CKD EPI equation. This calculation has not been validated in all clinical situations. eGFR's persistently <60 mL/min signify possible Chronic Kidney Disease.   . Anion gap 03/02/2016 8  5 - 15 Final  . Bilirubin, Direct 03/02/2016 0.2  0.1 - 0.5 mg/dL Final    Assessment:  Elizabeth Bond is a 66 y.o. female with metastatic Her2/neu positive right breast cancer.  She initially presented in 03/2013 with an ulcerated breast mass and back pain.  Breast biopsy revealed lobular breast cancer.  Tumor was ER/PR positive and HER-2/neu positive. PET scan revealed bone  metastasis. She received palliative radiation to T8 and T10 from 04/02-04/15/2015.  She declined chemotherapy. She initially began letrozole and Tykerb. Tykerb was discontinued after 1 dose. She received letrozole from 05/02/2013 until 01/07/2014 .  She received Herceptin from 06/02/2013 until 12/15/2013. She received fulvestrant monthly from 01/07/2014 until 05/27/2014.    She has received Xgeva monthly (03/31/2013 until 08/25/2014; last 12/09/2015).  She was switched to every 6 weeks Xgeva to coordinate with her treatment.  Herceptin was discontinued for a rising CA-27-29.  CA27.29 was 916.3 on 03/19/2013, 286.2 on 06/23/2013, 70.8 on 08/25/2013, 56.3 on 10/08/2013, 102.7 on 12/15/2013, 149.4 on 01/07/2014, 178.0 on 02/04/2014, 190.7 on 03/30/2014, 143.2 on 04/22/2014, 134.8 on 05/20/2014, 248.3 on 07/09/2014, 723.2 on 08/25/2014, 252.3 on 10/08/2014, 126 on 10/29/2014, 71.3 on 11/24/2014, 55 on 12/15/2014, 70.6 on 01/05/2015, 48.6 on 01/26/2015, 52.7 on 02/15/2015, 54.7 on 03/30/2015, 54 on 04/20/2015, 55.2 on 06/01/2015, 48.8 on 06/22/2015, 49.7 08/03/2015, 44.6 on 08/24/2015, 44.5 on 09/16/2015, 44.8 on 10/07/2015, 52.1 on 10/28/2015, 47.0 on 11/18/2015, 49.9 on 12/09/2015, 46.7 on 12/30/2015, and 53.3 on 02/03/2016.   She received Ibrance and Faslodex from 02/23/2014 until 07/27/2014.    Echo on 09/04/2014 revealed an EF of 55-65%.  Echo on 12/14/2014, 03/15/2015, 06/15/2015 revealed an EF of 55-60%, 50% on 09/23/2015, and 60-65% on 11/16/2015.   PET scan on 08/28/2014 revealed significant interval worsening and multifocal osseous metastatic disease. Lesions had increased in number and metabolic activity. There were no extra osseous metastasis.  She is s/p 23 cycles of Kadcyla (09/10/2014 - 12/30/2015).  She is tolerating treatment well.  She denies any bone pain.   PET scan on 02/08/2015 revealed a complete metabolic response in the sclerotic osseous metastases, with no residual hypermetabolic  metastatic disease.  PET scan on 08/23/2015 revealed no new or progressive hypermetabolic metastatic disease.   Bone scan on 07/30/2015 revealed multifocal osseous metastases (sternum, thoracolumbar spine, multiple ribs, and right iliac crest).  The  dominant left acetabular metastasis noted on prior PET was not evident. Some of the other sites of disease appear to have progressed.  There was no prior bone scan for comparison.  PET scan on 08/23/2015 revealed no new or progressive hypermetabolic disease.  There was stable mildly hypermetabolic sclerotic metastasis in the right proximal femoral metaphysis.  There was stable nonspecific mild patchy hypermetabolism in the upper and lower spine correlating with mixed lytic and sclerotic osseous metastases in the CT images  She was admitted from 12/22/2014 - 12/24/2014 with E coli sepsis secondary to a UTI.  Serum protein is elevated.  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  Plan: 1.  Labs today:  CBC with diff, CMP, Mg, CA27.29. 2.  Cycle #25 Kadcyla. 3.  No Xgeva today secondary to hypocalcemia. 4.  Bone scan on 02/16/2016 revealed multiple osseous metastases with distribution of metastases these similar to that seen on the previous exam. 5.  Echo on 02/16/2016 revealed an EF of 60-65%. 6.  Continnue calcium 600 mg po TID.  7.  Continue potassium 10 meq q day. 8.  Continue magnesium 1 pill a day. 9.  RTC in 3 weeks for MD assessment, labs (CBC with diff, CMP, Mg, CA27.29), Xgeva, and cycle #26 Kadcyla (T-DM1)   Lloyd Huger, MD  03/05/2016, 5:31 PM

## 2016-03-02 ENCOUNTER — Inpatient Hospital Stay: Payer: Medicare Other

## 2016-03-02 ENCOUNTER — Inpatient Hospital Stay: Payer: Medicare Other | Attending: Oncology

## 2016-03-02 ENCOUNTER — Other Ambulatory Visit: Payer: Self-pay | Admitting: Hematology and Oncology

## 2016-03-02 ENCOUNTER — Inpatient Hospital Stay (HOSPITAL_BASED_OUTPATIENT_CLINIC_OR_DEPARTMENT_OTHER): Payer: Medicare Other | Admitting: Oncology

## 2016-03-02 VITALS — BP 164/87 | HR 91

## 2016-03-02 VITALS — BP 172/91 | HR 95 | Temp 98.0°F | Wt 115.3 lb

## 2016-03-02 DIAGNOSIS — Z170 Estrogen receptor positive status [ER+]: Secondary | ICD-10-CM | POA: Insufficient documentation

## 2016-03-02 DIAGNOSIS — C7951 Secondary malignant neoplasm of bone: Secondary | ICD-10-CM

## 2016-03-02 DIAGNOSIS — D890 Polyclonal hypergammaglobulinemia: Secondary | ICD-10-CM | POA: Diagnosis not present

## 2016-03-02 DIAGNOSIS — C50911 Malignant neoplasm of unspecified site of right female breast: Secondary | ICD-10-CM | POA: Diagnosis not present

## 2016-03-02 DIAGNOSIS — Z79899 Other long term (current) drug therapy: Secondary | ICD-10-CM | POA: Diagnosis not present

## 2016-03-02 DIAGNOSIS — J449 Chronic obstructive pulmonary disease, unspecified: Secondary | ICD-10-CM | POA: Insufficient documentation

## 2016-03-02 DIAGNOSIS — K589 Irritable bowel syndrome without diarrhea: Secondary | ICD-10-CM | POA: Diagnosis not present

## 2016-03-02 DIAGNOSIS — R17 Unspecified jaundice: Secondary | ICD-10-CM

## 2016-03-02 DIAGNOSIS — M25511 Pain in right shoulder: Secondary | ICD-10-CM

## 2016-03-02 DIAGNOSIS — F17210 Nicotine dependence, cigarettes, uncomplicated: Secondary | ICD-10-CM | POA: Insufficient documentation

## 2016-03-02 LAB — CBC WITH DIFFERENTIAL/PLATELET
Basophils Absolute: 0 10*3/uL (ref 0–0.1)
Basophils Relative: 1 %
Eosinophils Absolute: 0.1 10*3/uL (ref 0–0.7)
Eosinophils Relative: 1 %
HCT: 36.6 % (ref 35.0–47.0)
Hemoglobin: 13 g/dL (ref 12.0–16.0)
Lymphocytes Relative: 18 %
Lymphs Abs: 1.1 10*3/uL (ref 1.0–3.6)
MCH: 34.2 pg — ABNORMAL HIGH (ref 26.0–34.0)
MCHC: 35.5 g/dL (ref 32.0–36.0)
MCV: 96.2 fL (ref 80.0–100.0)
Monocytes Absolute: 0.4 10*3/uL (ref 0.2–0.9)
Monocytes Relative: 8 %
Neutro Abs: 4.3 10*3/uL (ref 1.4–6.5)
Neutrophils Relative %: 72 %
Platelets: 248 10*3/uL (ref 150–440)
RBC: 3.81 MIL/uL (ref 3.80–5.20)
RDW: 15.2 % — ABNORMAL HIGH (ref 11.5–14.5)
WBC: 5.9 10*3/uL (ref 3.6–11.0)

## 2016-03-02 LAB — COMPREHENSIVE METABOLIC PANEL
ALT: 32 U/L (ref 14–54)
AST: 56 U/L — ABNORMAL HIGH (ref 15–41)
Albumin: 3.4 g/dL — ABNORMAL LOW (ref 3.5–5.0)
Alkaline Phosphatase: 96 U/L (ref 38–126)
Anion gap: 8 (ref 5–15)
BUN: 17 mg/dL (ref 6–20)
CO2: 29 mmol/L (ref 22–32)
Calcium: 10.9 mg/dL — ABNORMAL HIGH (ref 8.9–10.3)
Chloride: 101 mmol/L (ref 101–111)
Creatinine, Ser: 0.57 mg/dL (ref 0.44–1.00)
GFR calc Af Amer: >60 mL/min (ref >60)
GFR calc non Af Amer: >60 mL/min (ref >60)
Glucose, Bld: 112 mg/dL — ABNORMAL HIGH (ref 65–99)
Potassium: 3.7 mmol/L (ref 3.5–5.1)
Sodium: 138 mmol/L (ref 135–145)
Total Bilirubin: 1 mg/dL (ref 0.3–1.2)
Total Protein: 8.5 g/dL — ABNORMAL HIGH (ref 6.5–8.1)

## 2016-03-02 LAB — BILIRUBIN, DIRECT: Bilirubin, Direct: 0.2 mg/dL (ref 0.1–0.5)

## 2016-03-02 MED ORDER — SODIUM CHLORIDE 0.9% FLUSH
10.0000 mL | INTRAVENOUS | Status: DC | PRN
  Administered 2016-03-02: 10 mL via INTRAVENOUS
  Filled 2016-03-02: qty 10

## 2016-03-02 MED ORDER — SODIUM CHLORIDE 0.9 % IV SOLN: 3.6000 mg/kg | Freq: Once | INTRAVENOUS | Status: DC

## 2016-03-02 MED ORDER — HEPARIN SOD (PORK) LOCK FLUSH 100 UNIT/ML IV SOLN
500.0000 [IU] | Freq: Once | INTRAVENOUS | Status: AC
  Administered 2016-03-02: 500 [IU] via INTRAVENOUS
  Filled 2016-03-02: qty 5

## 2016-03-02 MED ORDER — SODIUM CHLORIDE 0.9 % IV SOLN
Freq: Once | INTRAVENOUS | Status: AC
  Administered 2016-03-02: 12:00:00 via INTRAVENOUS
  Filled 2016-03-02: qty 1000

## 2016-03-02 MED ORDER — SODIUM CHLORIDE 0.9 % IV SOLN
3.6000 mg/kg | Freq: Once | INTRAVENOUS | Status: AC
  Administered 2016-03-02: 180 mg via INTRAVENOUS
  Filled 2016-03-02: qty 9

## 2016-03-02 MED ORDER — ACETAMINOPHEN 325 MG PO TABS
650.0000 mg | ORAL_TABLET | Freq: Once | ORAL | Status: AC
  Administered 2016-03-02: 650 mg via ORAL
  Filled 2016-03-02: qty 2

## 2016-03-02 MED ORDER — HEPARIN SOD (PORK) LOCK FLUSH 100 UNIT/ML IV SOLN
500.0000 [IU] | Freq: Once | INTRAVENOUS | Status: AC | PRN
  Administered 2016-03-02: 500 [IU]

## 2016-03-02 MED ORDER — DIPHENHYDRAMINE HCL 25 MG PO CAPS
50.0000 mg | ORAL_CAPSULE | Freq: Once | ORAL | Status: AC
  Administered 2016-03-02: 50 mg via ORAL
  Filled 2016-03-02: qty 2

## 2016-03-02 NOTE — Progress Notes (Signed)
Patient offers no complaints today. BP elevated today.  Will have infusion recheck.

## 2016-03-20 ENCOUNTER — Other Ambulatory Visit: Payer: Self-pay | Admitting: *Deleted

## 2016-03-20 DIAGNOSIS — Z5111 Encounter for antineoplastic chemotherapy: Secondary | ICD-10-CM

## 2016-03-20 DIAGNOSIS — R17 Unspecified jaundice: Secondary | ICD-10-CM

## 2016-03-20 DIAGNOSIS — C7951 Secondary malignant neoplasm of bone: Secondary | ICD-10-CM

## 2016-03-20 DIAGNOSIS — C50911 Malignant neoplasm of unspecified site of right female breast: Secondary | ICD-10-CM

## 2016-03-20 DIAGNOSIS — Z170 Estrogen receptor positive status [ER+]: Secondary | ICD-10-CM

## 2016-03-20 MED ORDER — K PHOS MONO-SOD PHOS DI & MONO 155-852-130 MG PO TABS: 250.0000 mg | ORAL_TABLET | Freq: Two times a day (BID) | ORAL | 2 refills | Status: DC

## 2016-03-20 MED ORDER — POTASSIUM CHLORIDE ER 10 MEQ PO TBCR: 10.0000 meq | EXTENDED_RELEASE_TABLET | Freq: Every day | ORAL | 2 refills | Status: DC

## 2016-03-20 NOTE — Telephone Encounter (Signed)
Comprehensive metabolic panel  Order: [id number]  Status:  Final result Visible to patient:  Yes (MyChart) Next appt:  03/23/2016 at 09:30 AM in Oncology ([facility]) Dx:  Malignant neoplasm of right breast in...    Ref Range & Units 2wk ago 3wk ago 34mo ago   Sodium 135 - 145 mmol/L 138  135  137    Potassium 3.5 - 5.1 mmol/L 3.7  3.7  3.6    Chloride 101 - 111 mmol/L 101  104  108    CO2 22 - 32 mmol/L 29  23  24     Glucose, Bld 65 - 99 mg/dL 112   106   93    BUN 6 - 20 mg/dL 17  14  15     Creatinine, Ser 0.44 - 1.00 mg/dL 0.57  0.62  0.64    Calcium 8.9 - 10.3 mg/dL 10.9   9.4  8.8     Total Protein 6.5 - 8.1 g/dL 8.5   8.8   8.0    Albumin 3.5 - 5.0 g/dL 3.4   3.6  3.5    AST 15 - 41 U/L 56   53   47     ALT 14 - 54 U/L 32  30  26    Alkaline Phosphatase 38 - 126 U/L 96  85  96    Total Bilirubin 0.3 - 1.2 mg/dL 1.0  1.8   1.1    GFR calc non Af Amer >60 mL/min >60  >60  >60    GFR calc Af Amer >60 mL/min >60  >60CM  >60CM

## 2016-03-23 ENCOUNTER — Inpatient Hospital Stay: Payer: Medicare Other

## 2016-03-23 ENCOUNTER — Encounter: Payer: Self-pay | Admitting: Hematology and Oncology

## 2016-03-23 ENCOUNTER — Other Ambulatory Visit: Payer: Self-pay | Admitting: Hematology and Oncology

## 2016-03-23 ENCOUNTER — Inpatient Hospital Stay (HOSPITAL_BASED_OUTPATIENT_CLINIC_OR_DEPARTMENT_OTHER): Payer: Medicare Other | Admitting: Hematology and Oncology

## 2016-03-23 VITALS — BP 153/79 | HR 91 | Temp 97.2°F | Wt 117.4 lb

## 2016-03-23 DIAGNOSIS — Z79899 Other long term (current) drug therapy: Secondary | ICD-10-CM | POA: Diagnosis not present

## 2016-03-23 DIAGNOSIS — C50911 Malignant neoplasm of unspecified site of right female breast: Secondary | ICD-10-CM

## 2016-03-23 DIAGNOSIS — Z170 Estrogen receptor positive status [ER+]: Secondary | ICD-10-CM

## 2016-03-23 DIAGNOSIS — R17 Unspecified jaundice: Secondary | ICD-10-CM

## 2016-03-23 DIAGNOSIS — K589 Irritable bowel syndrome without diarrhea: Secondary | ICD-10-CM | POA: Diagnosis not present

## 2016-03-23 DIAGNOSIS — C7951 Secondary malignant neoplasm of bone: Secondary | ICD-10-CM

## 2016-03-23 DIAGNOSIS — Z5111 Encounter for antineoplastic chemotherapy: Secondary | ICD-10-CM

## 2016-03-23 LAB — COMPREHENSIVE METABOLIC PANEL
ALT: 27 U/L (ref 14–54)
AST: 51 U/L — ABNORMAL HIGH (ref 15–41)
Albumin: 3.4 g/dL — ABNORMAL LOW (ref 3.5–5.0)
Alkaline Phosphatase: 80 U/L (ref 38–126)
Anion gap: 7 (ref 5–15)
BUN: 19 mg/dL (ref 6–20)
CO2: 24 mmol/L (ref 22–32)
Calcium: 9.4 mg/dL (ref 8.9–10.3)
Chloride: 104 mmol/L (ref 101–111)
Creatinine, Ser: 0.67 mg/dL (ref 0.44–1.00)
GFR calc Af Amer: >60 mL/min (ref >60)
GFR calc non Af Amer: >60 mL/min (ref >60)
Glucose, Bld: 98 mg/dL (ref 65–99)
Potassium: 3.5 mmol/L (ref 3.5–5.1)
Sodium: 135 mmol/L (ref 135–145)
Total Bilirubin: 1.1 mg/dL (ref 0.3–1.2)
Total Protein: 8.7 g/dL — ABNORMAL HIGH (ref 6.5–8.1)

## 2016-03-23 LAB — CBC WITH DIFFERENTIAL/PLATELET
Basophils Absolute: 0 10*3/uL (ref 0–0.1)
Basophils Relative: 1 %
Eosinophils Absolute: 0.1 10*3/uL (ref 0–0.7)
Eosinophils Relative: 1 %
HCT: 36 % (ref 35.0–47.0)
Hemoglobin: 12.6 g/dL (ref 12.0–16.0)
Lymphocytes Relative: 16 %
Lymphs Abs: 0.9 10*3/uL — ABNORMAL LOW (ref 1.0–3.6)
MCH: 33.7 pg (ref 26.0–34.0)
MCHC: 35.1 g/dL (ref 32.0–36.0)
MCV: 96 fL (ref 80.0–100.0)
Monocytes Absolute: 0.4 10*3/uL (ref 0.2–0.9)
Monocytes Relative: 8 %
Neutro Abs: 4.2 10*3/uL (ref 1.4–6.5)
Neutrophils Relative %: 74 %
Platelets: 235 10*3/uL (ref 150–440)
RBC: 3.75 MIL/uL — ABNORMAL LOW (ref 3.80–5.20)
RDW: 15.1 % — ABNORMAL HIGH (ref 11.5–14.5)
WBC: 5.7 10*3/uL (ref 3.6–11.0)

## 2016-03-23 LAB — BILIRUBIN, DIRECT: Bilirubin, Direct: 0.2 mg/dL (ref 0.1–0.5)

## 2016-03-23 LAB — MAGNESIUM: Magnesium: 1.8 mg/dL (ref 1.7–2.4)

## 2016-03-23 MED ORDER — ACETAMINOPHEN 325 MG PO TABS
650.0000 mg | ORAL_TABLET | Freq: Once | ORAL | Status: AC
  Administered 2016-03-23: 650 mg via ORAL
  Filled 2016-03-23: qty 2

## 2016-03-23 MED ORDER — DENOSUMAB 120 MG/1.7ML ~~LOC~~ SOLN
120.0000 mg | Freq: Once | SUBCUTANEOUS | Status: AC
  Administered 2016-03-23: 120 mg via SUBCUTANEOUS
  Filled 2016-03-23: qty 1.7

## 2016-03-23 MED ORDER — HEPARIN SOD (PORK) LOCK FLUSH 100 UNIT/ML IV SOLN
500.0000 [IU] | Freq: Once | INTRAVENOUS | Status: AC | PRN
  Administered 2016-03-23: 500 [IU]
  Filled 2016-03-23: qty 5

## 2016-03-23 MED ORDER — DIPHENHYDRAMINE HCL 25 MG PO CAPS
50.0000 mg | ORAL_CAPSULE | Freq: Once | ORAL | Status: AC
  Administered 2016-03-23: 50 mg via ORAL
  Filled 2016-03-23: qty 2

## 2016-03-23 MED ORDER — PANTOPRAZOLE SODIUM 40 MG PO TBEC: 40.0000 mg | DELAYED_RELEASE_TABLET | Freq: Every day | ORAL | 6 refills | Status: AC

## 2016-03-23 MED ORDER — TEMAZEPAM 15 MG PO CAPS: 15.0000 mg | ORAL_CAPSULE | Freq: Every evening | ORAL | 0 refills | Status: DC | PRN

## 2016-03-23 MED ORDER — SODIUM CHLORIDE 0.9 % IV SOLN
Freq: Once | INTRAVENOUS | Status: AC
  Administered 2016-03-23: 10:00:00 via INTRAVENOUS
  Filled 2016-03-23: qty 1000

## 2016-03-23 MED ORDER — SODIUM CHLORIDE 0.9 % IV SOLN
3.6000 mg/kg | Freq: Once | INTRAVENOUS | Status: AC
  Administered 2016-03-23: 200 mg via INTRAVENOUS
  Filled 2016-03-23: qty 10

## 2016-03-23 NOTE — Progress Notes (Signed)
[facility]  Clinic day:  03/23/16  Chief Complaint: Elizabeth Bond is a 66 y.o. female with metastatic Her2/neu positive right breast cancer who is seen for assessment prior to cycle #26 Kadcyla.  HPI:  The patient was last seen by me in the medical oncology clinic on 02/24/2016.  At that time, she had moderate unexplained pain in her right shoulder without trauma.  Exam revealed a mildly swollen right shoulder with decreased mobility.  Plain film revealed no boney abnormality.  Bilirubin was elevated (1.8).  Potassium, magnesium, and calcium were normal on supplementation.   Treatment was held.  She was seen by Dr. Orlie Dakin in my absence on 03/02/2016.  At that time, she was doing well.  She received Kadcyla.  During the interim, she has continued to do well.  She denies any shoulder pain.  She states that she had hit the refrigerator with her shoulder and that was what caused the pain.  She is eating well.  She denies any pain.   Past Medical History:  Diagnosis Date  . Breast cancer (HCC)    right  . Cancer (HCC) 2015   breast and bone  . COPD (chronic obstructive pulmonary disease) (HCC)   . IBS (irritable bowel syndrome)   . Metastasis to bone (HCC) 09/01/2014    Past Surgical History:  Procedure Laterality Date  . ABDOMINAL HYSTERECTOMY  1978  . PORTACATH PLACEMENT  2015  . REPLACEMENT TOTAL KNEE Left 2004-2005?    Family History  Problem Relation Age of Onset  . Cancer Father     prostate  . Cancer Mother     Pancreatic  . Cancer Maternal Aunt     breast  . Cancer Cousin     breast  . Cancer Other     lung cancer - neice  . Cancer - Other Daughter     brain    Social History:  reports that she quit smoking about 2 years ago. Her smoking use included Cigarettes. She has a 10.00 pack-year smoking history. She has never used smokeless tobacco. She reports that she does not drink alcohol or use drugs.  Her daughter's name is  Arboriculturist.  She lives in [place].  The patient is alone today.  Allergies:  Allergies  Allergen Reactions  . Fentanyl Other (See Comments)    "loopy and confused"    Current Medications: Current Outpatient Prescriptions  Medication Sig Dispense Refill  . acetaminophen (TYLENOL) 500 MG tablet Take 1,000 mg by mouth every 8 (eight) hours as needed for headache.    . Calcium Carb-Cholecalciferol (CALCIUM-VITAMIN D3) 600-400 MG-UNIT TABS Take 1 tablet by mouth 2 (two) times daily.    Marland Kitchen loratadine (CLARITIN) 10 MG tablet Take 10 mg by mouth daily.    . Magnesium 250 MG TABS Take 1 each by mouth daily.     . pantoprazole (PROTONIX) 40 MG tablet Take 1 tablet (40 mg total) by mouth daily. 30 tablet 6  . phosphorus (PHOSPHA 250 NEUTRAL) 155-852-130 MG tablet Take 1 tablet (250 mg total) by mouth 2 (two) times daily. 60 tablet 2  . potassium chloride (K-DUR) 10 MEQ tablet Take 1 tablet (10 mEq total) by mouth daily. 30 tablet 2  . temazepam (RESTORIL) 15 MG capsule Take 1 capsule (15 mg total) by mouth at bedtime as needed for sleep. 30 capsule 0  . clonazePAM (KLONOPIN) 0.5 MG tablet Take 1 tablet (0.5 mg total) by mouth 3 (three) times daily  as needed for anxiety. (Patient not taking: Reported on 02/03/2016) 90 tablet 2  . furosemide (LASIX) 20 MG tablet TAKE ONE TABLET BY MOUTH EVERY OTHER DAYAS NEEDED FOR SWELLING (Patient not taking: Reported on 02/03/2016) 30 tablet 1  . ondansetron (ZOFRAN) 4 MG tablet Take 1 tablet (4 mg total) by mouth every 8 (eight) hours as needed for nausea or vomiting. (Patient not taking: Reported on 02/03/2016) 20 tablet 1  . oxycodone (OXY-IR) 5 MG capsule Take 1 tablet every 6 hours by mouth as needed for pain (Patient not taking: Reported on 02/03/2016) 60 capsule 0   No current facility-administered medications for this visit.     Review of Systems:  GENERAL:  Feels good.  No fevers or sweats.  Weight up 2 pounds. PERFORMANCE STATUS (ECOG):  0 HEENT:  No visual  changes, runny nose, sore throat, mouth sores or tenderness. Lungs: No shortness of breath.  Chronic cough.  No hemoptysis. Cardiac:  No chest pain, palpitations, orthopnea, or PND.  Blood pressure up as worried about scans GI:  No nausea, vomiting, diarrhea, constipation, melena or hematochezia. GU:  No urgency, frequency, dysuria, or hematuria. Musculoskeletal: No shoulder pain.  No back pain.  No joint pain.  No muscle tenderness. Extremities:  No pain or swelling. Skin:  No rashes or skin changes. Neuro:  No headache, numbness or weakness, balance or coordination issues. Endocrine:  No diabetes, thyroid issues, hot flashes or night sweats. Psych:  Insomnia improved on Restoril.  No mood changes, depression or anxiety. Pain:  No focal pain.   Review of systems:  All other systems reviewed and found to be negative.   Physical Exam: Blood pressure (!) 153/79, pulse 91, temperature 97.2 F (36.2 C), temperature source Tympanic, weight 117 lb 6 oz (53.2 kg). GENERAL:  Thin woman sitting comfortably in the exam room in no acute distress. MENTAL STATUS:  Alert and oriented to person, place and time. HEAD:  Short styled gray hair.  Normocephalic, atraumatic, face symmetric, no Cushingoid features. EYES: Hazel eyes.  Pupils equal round and reactive to light and accomodation.  No conjunctivitis or scleral icterus. ENT:  Oropharynx clear without lesion.  Tongue normal. Mucous membranes moist.  RESPIRATORY:  Clear to auscultation without rales, wheezes or rhonchi. CARDIOVASCULAR:  Regular rate and rhythm without murmur, rub or gallop.  No JVD. ABDOMEN:  Soft, non-tender, with active bowel sounds, and no hepatosplenomegaly.  No masses. SKIN:  No rashes, ulcers or lesions. EXTREMITIES: No swelling.  No skin discoloration or tenderness.  No palpable cords. LYMPH NODES: No palpable cervical, supraclavicular, axillary or inguinal adenopathy  NEUROLOGICAL: Unremarkable. PSYCH:  Appropriate.     Appointment on 03/23/2016  Component Date Value Ref Range Status  . WBC 03/23/2016 5.7  3.6 - 11.0 K/uL Final  . RBC 03/23/2016 3.75* 3.80 - 5.20 MIL/uL Final  . Hemoglobin 03/23/2016 12.6  12.0 - 16.0 g/dL Final  . HCT 03/23/2016 36.0  35.0 - 47.0 % Final  . MCV 03/23/2016 96.0  80.0 - 100.0 fL Final  . MCH 03/23/2016 33.7  26.0 - 34.0 pg Final  . MCHC 03/23/2016 35.1  32.0 - 36.0 g/dL Final  . RDW 03/23/2016 15.1* 11.5 - 14.5 % Final  . Platelets 03/23/2016 235  150 - 440 K/uL Final  . Neutrophils Relative % 03/23/2016 74  % Final  . Neutro Abs 03/23/2016 4.2  1.4 - 6.5 K/uL Final  . Lymphocytes Relative 03/23/2016 16  % Final  . Lymphs Abs 03/23/2016 0.9*  1.0 - 3.6 K/uL Final  . Monocytes Relative 03/23/2016 8  % Final  . Monocytes Absolute 03/23/2016 0.4  0.2 - 0.9 K/uL Final  . Eosinophils Relative 03/23/2016 1  % Final  . Eosinophils Absolute 03/23/2016 0.1  0 - 0.7 K/uL Final  . Basophils Relative 03/23/2016 1  % Final  . Basophils Absolute 03/23/2016 0.0  0 - 0.1 K/uL Final  . Sodium 03/23/2016 135  135 - 145 mmol/L Final  . Potassium 03/23/2016 3.5  3.5 - 5.1 mmol/L Final  . Chloride 03/23/2016 104  101 - 111 mmol/L Final  . CO2 03/23/2016 24  22 - 32 mmol/L Final  . Glucose, Bld 03/23/2016 98  65 - 99 mg/dL Final  . BUN 53/31/9239 19  6 - 20 mg/dL Final  . Creatinine, Ser 03/23/2016 0.67  0.44 - 1.00 mg/dL Final  . Calcium 19/76/9450 9.4  8.9 - 10.3 mg/dL Final  . Total Protein 03/23/2016 8.7* 6.5 - 8.1 g/dL Final  . Albumin 92/01/1668 3.4* 3.5 - 5.0 g/dL Final  . AST 15/97/6111 51* 15 - 41 U/L Final  . ALT 03/23/2016 27  14 - 54 U/L Final  . Alkaline Phosphatase 03/23/2016 80  38 - 126 U/L Final  . Total Bilirubin 03/23/2016 1.1  0.3 - 1.2 mg/dL Final  . GFR calc non Af Amer 03/23/2016 >60  >60 mL/min Final  . GFR calc Af Amer 03/23/2016 >60  >60 mL/min Final   Comment: (NOTE) The eGFR has been calculated using the CKD EPI equation. This calculation has not  been validated in all clinical situations. eGFR's persistently <60 mL/min signify possible Chronic Kidney Disease.   . Anion gap 03/23/2016 7  5 - 15 Final  . Bilirubin, Direct 03/23/2016 0.2  0.1 - 0.5 mg/dL Final    Assessment:  Elizabeth Bond is a 66 y.o. female with metastatic Her2/neu positive right breast cancer.  She initially presented in 03/2013 with an ulcerated breast mass and back pain.  Breast biopsy revealed lobular breast cancer.  Tumor was ER/PR positive and HER-2/neu positive. PET scan revealed bone metastasis. She received palliative radiation to T8 and T10 from 04/02-04/15/2015.  She declined chemotherapy. She initially began letrozole and Tykerb. Tykerb was discontinued after 1 dose. She received letrozole from 05/02/2013 until 01/07/2014 .  She received Herceptin from 06/02/2013 until 12/15/2013. She received fulvestrant monthly from 01/07/2014 until 05/27/2014.    She has received Xgeva monthly (03/31/2013 until 08/25/2014; last 02/24/2016).  She was switched to every 6 weeks Xgeva to coordinate with her treatment.  Herceptin was discontinued for a rising CA-27-29.  CA27.29 was 916.3 on 03/19/2013, 286.2 on 06/23/2013, 70.8 on 08/25/2013, 56.3 on 10/08/2013, 102.7 on 12/15/2013, 149.4 on 01/07/2014, 178.0 on 02/04/2014, 190.7 on 03/30/2014, 143.2 on 04/22/2014, 134.8 on 05/20/2014, 248.3 on 07/09/2014, 723.2 on 08/25/2014, 252.3 on 10/08/2014, 126 on 10/29/2014, 71.3 on 11/24/2014, 55 on 12/15/2014, 70.6 on 01/05/2015, 48.6 on 01/26/2015, 52.7 on 02/15/2015, 54.7 on 03/30/2015, 54 on 04/20/2015, 55.2 on 06/01/2015, 48.8 on 06/22/2015, 49.7 08/03/2015, 44.6 on 08/24/2015, 44.5 on 09/16/2015, 44.8 on 10/07/2015, 52.1 on 10/28/2015, 47.0 on 11/18/2015, 49.9 on 12/09/2015, 46.7 on 12/30/2015, 53.3 on 02/03/2016, 49.3 on 02/24/2016, and 56.3 on 03/23/2016.  She received Ibrance and Faslodex from 02/23/2014 until 07/27/2014.    Echo on 09/04/2014 revealed an EF of 55-65%.  Echo on  12/14/2014, 03/15/2015, 06/15/2015 revealed an EF of 55-60%, 50% on 09/23/2015, 60-65% on 11/16/2015, and 60-65% on 02/16/2016.   PET scan on 08/28/2014  revealed significant interval worsening and multifocal osseous metastatic disease. Lesions had increased in number and metabolic activity. There were no extra osseous metastasis.  She is s/p 25 cycles of Kadcyla (09/10/2014 - 03/02/2016).  She is tolerating treatment well.  She denies any bone pain.   PET scan on 02/08/2015 revealed a complete metabolic response in the sclerotic osseous metastases, with no residual hypermetabolic metastatic disease.  PET scan on 08/23/2015 revealed no new or progressive hypermetabolic metastatic disease.   Bone scan on 07/30/2015 revealed multifocal osseous metastases (sternum, thoracolumbar spine, multiple ribs, and right iliac crest).  The dominant left acetabular metastasis noted on prior PET was not evident. Some of the other sites of disease appear to have progressed.  There was no prior bone scan for comparison.  PET scan on 08/23/2015 revealed no new or progressive hypermetabolic disease.  There was stable mildly hypermetabolic sclerotic metastasis in the right proximal femoral metaphysis.  There was stable nonspecific mild patchy hypermetabolism in the upper and lower spine correlating with mixed lytic and sclerotic osseous metastases in the CT images  Bone scan on 02/16/2016 revealed multiple osseous metastasis with distribution of metastasis similar to that seen on the previous exam.  She was admitted from 12/22/2014 - 12/24/2014 with E coli sepsis secondary to a UTI.  Serum protein is elevated.  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  She has intermittent electrolytes issues (hypokalemia, hypomagnesemia, and hypocalcemia).  She is on supplementation.  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  Symptomatically, she feels well. Exam is  unremarkable. Bilirubin is normal.  Potassium, magnesium, and calcium are normal on supplementation.   Plan: 1.  Labs today:  CBC with diff, CMP, Mg, CA27.29. 2.  Discuss plans for PET scan and echo in mid 05/2016. 3.  Kadcyla today.  4.  Xgeva today. 5.  Continue potassium 10 meq q day. 6.  Continue magnesium 1 pill a day. 7.  RTC in 3 weeks for MD assessment, labs (CBC with diff, CMP, Mg, direct bilirubin, CA27-29), and Kadcyla.   Lequita Asal, MD  03/23/2016, 9:50 AM

## 2016-03-23 NOTE — Progress Notes (Signed)
Patient states she is needing refills for pantoprazole and temazepam, but is asking for help from [facility] through the foundation to have meds filled at [facility].

## 2016-03-24 LAB — CANCER ANTIGEN 27.29: CA 27.29: 56.3 U/mL — ABNORMAL HIGH (ref 0.0–38.6)

## 2016-04-13 ENCOUNTER — Other Ambulatory Visit: Payer: Self-pay | Admitting: Hematology and Oncology

## 2016-04-14 ENCOUNTER — Inpatient Hospital Stay: Payer: Medicare Other | Attending: Hematology and Oncology

## 2016-04-14 ENCOUNTER — Inpatient Hospital Stay (HOSPITAL_BASED_OUTPATIENT_CLINIC_OR_DEPARTMENT_OTHER): Payer: Medicare Other | Admitting: Hematology and Oncology

## 2016-04-14 ENCOUNTER — Encounter: Payer: Self-pay | Admitting: Hematology and Oncology

## 2016-04-14 ENCOUNTER — Inpatient Hospital Stay: Payer: Medicare Other

## 2016-04-14 VITALS — BP 155/86 | HR 76 | Resp 18

## 2016-04-14 VITALS — BP 171/101 | HR 82 | Temp 97.1°F | Resp 18 | Wt 117.1 lb

## 2016-04-14 DIAGNOSIS — C7951 Secondary malignant neoplasm of bone: Secondary | ICD-10-CM

## 2016-04-14 DIAGNOSIS — H539 Unspecified visual disturbance: Secondary | ICD-10-CM

## 2016-04-14 DIAGNOSIS — E876 Hypokalemia: Secondary | ICD-10-CM | POA: Diagnosis not present

## 2016-04-14 DIAGNOSIS — Z5112 Encounter for antineoplastic immunotherapy: Secondary | ICD-10-CM | POA: Insufficient documentation

## 2016-04-14 DIAGNOSIS — D890 Polyclonal hypergammaglobulinemia: Secondary | ICD-10-CM

## 2016-04-14 DIAGNOSIS — K589 Irritable bowel syndrome without diarrhea: Secondary | ICD-10-CM | POA: Insufficient documentation

## 2016-04-14 DIAGNOSIS — C50911 Malignant neoplasm of unspecified site of right female breast: Secondary | ICD-10-CM | POA: Diagnosis not present

## 2016-04-14 DIAGNOSIS — R17 Unspecified jaundice: Secondary | ICD-10-CM

## 2016-04-14 DIAGNOSIS — Z79899 Other long term (current) drug therapy: Secondary | ICD-10-CM | POA: Diagnosis not present

## 2016-04-14 DIAGNOSIS — Z170 Estrogen receptor positive status [ER+]: Secondary | ICD-10-CM | POA: Insufficient documentation

## 2016-04-14 DIAGNOSIS — Z87891 Personal history of nicotine dependence: Secondary | ICD-10-CM | POA: Diagnosis not present

## 2016-04-14 DIAGNOSIS — Z5111 Encounter for antineoplastic chemotherapy: Secondary | ICD-10-CM

## 2016-04-14 DIAGNOSIS — J449 Chronic obstructive pulmonary disease, unspecified: Secondary | ICD-10-CM | POA: Insufficient documentation

## 2016-04-14 LAB — CBC WITH DIFFERENTIAL/PLATELET
Basophils Absolute: 0 10*3/uL (ref 0–0.1)
Basophils Relative: 1 %
Eosinophils Absolute: 0.1 10*3/uL (ref 0–0.7)
Eosinophils Relative: 1 %
HCT: 35.8 % (ref 35.0–47.0)
Hemoglobin: 12.6 g/dL (ref 12.0–16.0)
Lymphocytes Relative: 18 %
Lymphs Abs: 0.9 10*3/uL — ABNORMAL LOW (ref 1.0–3.6)
MCH: 33.9 pg (ref 26.0–34.0)
MCHC: 35.3 g/dL (ref 32.0–36.0)
MCV: 96.1 fL (ref 80.0–100.0)
Monocytes Absolute: 0.4 10*3/uL (ref 0.2–0.9)
Monocytes Relative: 9 %
Neutro Abs: 3.6 10*3/uL (ref 1.4–6.5)
Neutrophils Relative %: 71 %
Platelets: 227 10*3/uL (ref 150–440)
RBC: 3.73 MIL/uL — ABNORMAL LOW (ref 3.80–5.20)
RDW: 15 % — ABNORMAL HIGH (ref 11.5–14.5)
WBC: 5.1 10*3/uL (ref 3.6–11.0)

## 2016-04-14 LAB — COMPREHENSIVE METABOLIC PANEL
ALT: 27 U/L (ref 14–54)
AST: 51 U/L — ABNORMAL HIGH (ref 15–41)
Albumin: 3.5 g/dL (ref 3.5–5.0)
Alkaline Phosphatase: 81 U/L (ref 38–126)
Anion gap: 7 (ref 5–15)
BUN: 15 mg/dL (ref 6–20)
CO2: 26 mmol/L (ref 22–32)
Calcium: 9.6 mg/dL (ref 8.9–10.3)
Chloride: 104 mmol/L (ref 101–111)
Creatinine, Ser: 0.64 mg/dL (ref 0.44–1.00)
GFR calc Af Amer: >60 mL/min (ref >60)
GFR calc non Af Amer: >60 mL/min (ref >60)
Glucose, Bld: 92 mg/dL (ref 65–99)
Potassium: 3.8 mmol/L (ref 3.5–5.1)
Sodium: 137 mmol/L (ref 135–145)
Total Bilirubin: 1.3 mg/dL — ABNORMAL HIGH (ref 0.3–1.2)
Total Protein: 8.3 g/dL — ABNORMAL HIGH (ref 6.5–8.1)

## 2016-04-14 LAB — MAGNESIUM: Magnesium: 1.9 mg/dL (ref 1.7–2.4)

## 2016-04-14 LAB — BILIRUBIN, DIRECT: Bilirubin, Direct: 0.2 mg/dL (ref 0.1–0.5)

## 2016-04-14 MED ORDER — SODIUM CHLORIDE 0.9 % IV SOLN
Freq: Once | INTRAVENOUS | Status: AC
  Administered 2016-04-14: 10:00:00 via INTRAVENOUS
  Filled 2016-04-14: qty 1000

## 2016-04-14 MED ORDER — ACETAMINOPHEN 325 MG PO TABS
650.0000 mg | ORAL_TABLET | Freq: Once | ORAL | Status: AC
  Administered 2016-04-14: 650 mg via ORAL
  Filled 2016-04-14: qty 2

## 2016-04-14 MED ORDER — HEPARIN SOD (PORK) LOCK FLUSH 100 UNIT/ML IV SOLN
500.0000 [IU] | Freq: Once | INTRAVENOUS | Status: AC | PRN
  Administered 2016-04-14: 500 [IU]
  Filled 2016-04-14: qty 5

## 2016-04-14 MED ORDER — LORAZEPAM 0.5 MG PO TABS
0.5000 mg | ORAL_TABLET | Freq: Once | ORAL | Status: AC
  Administered 2016-04-14: 0.5 mg via ORAL
  Filled 2016-04-14: qty 1

## 2016-04-14 MED ORDER — DIPHENHYDRAMINE HCL 25 MG PO CAPS
50.0000 mg | ORAL_CAPSULE | Freq: Once | ORAL | Status: AC
  Administered 2016-04-14: 50 mg via ORAL
  Filled 2016-04-14: qty 2

## 2016-04-14 MED ORDER — SODIUM CHLORIDE 0.9 % IJ SOLN
10.0000 mL | INTRAMUSCULAR | Status: DC | PRN
  Administered 2016-04-14: 10 mL
  Filled 2016-04-14: qty 10

## 2016-04-14 MED ORDER — SODIUM CHLORIDE 0.9 % IV SOLN
3.6000 mg/kg | Freq: Once | INTRAVENOUS | Status: AC
  Administered 2016-04-14: 200 mg via INTRAVENOUS
  Filled 2016-04-14: qty 10

## 2016-04-14 NOTE — Progress Notes (Signed)
Recheck BP: 164/91. Patient requesting anxiety medication. MD, Dr. Mike Gip, notified via telephone and aware. Per MD order: Ativan 0.5mg  PO once order placed. See MAR.

## 2016-04-14 NOTE — Progress Notes (Signed)
Patient offers no concerns today. 

## 2016-04-14 NOTE — Progress Notes (Signed)
[facility] day:  04/14/16  Chief Complaint: TAHIRY Bond is a 66 y.o. female with metastatic Her2/neu positive right breast cancer who is seen for assessment prior to cycle #27 Kadcyla.  HPI:  The patient was last seen by me in the medical oncology clinic on 03/23/2016.  At that time, she was doing well.  She had gained 2 pounds.  She denied any complaint.  She received cycle #26 Kadcyla.  She received Xgeva.  During the interim, she denies any new complaints. She states that she stays stressed. She notes some issues with her distant vision. Eyes are dry.  She denies any headache, numbness, weakness, balance or coordination issues.     Past Medical History:  Diagnosis Date  . Breast cancer ([place])    right  . Cancer ([place]) 2015   breast and bone  . COPD (chronic obstructive pulmonary disease) ([place])   . IBS (irritable bowel syndrome)   . Metastasis to bone ([place]) 09/01/2014    Past Surgical History:  Procedure Laterality Date  . ABDOMINAL HYSTERECTOMY  1978  . PORTACATH PLACEMENT  2015  . REPLACEMENT TOTAL KNEE Left 2004-2005?    Family History  Problem Relation Age of Onset  . Cancer Father     prostate  . Cancer Mother     Pancreatic  . Cancer Maternal Aunt     breast  . Cancer Cousin     breast  . Cancer Other     lung cancer - neice  . Cancer - Other Daughter     brain    Social History:  reports that she quit smoking about 3 years ago. Her smoking use included Cigarettes. She has a 10.00 pack-year smoking history. She has never used smokeless tobacco. She reports that she does not drink alcohol or use drugs.  Her daughter's name is IT trainer.  She lives in [place].  The patient is alone today.  Allergies:  Allergies  Allergen Reactions  . Fentanyl Other (See Comments)    "loopy and confused"    Current Medications: Current Outpatient Prescriptions  Medication Sig Dispense Refill  . acetaminophen (TYLENOL) 500 MG  tablet Take 1,000 mg by mouth every 8 (eight) hours as needed for headache.    . Calcium Carb-Cholecalciferol (CALCIUM-VITAMIN D3) 600-400 MG-UNIT TABS Take 1 tablet by mouth 2 (two) times daily.    Marland Kitchen loratadine (CLARITIN) 10 MG tablet Take 10 mg by mouth daily.    . Magnesium 250 MG TABS Take 1 each by mouth daily.     . pantoprazole (PROTONIX) 40 MG tablet Take 1 tablet (40 mg total) by mouth daily. 30 tablet 6  . phosphorus (PHOSPHA 250 NEUTRAL) 155-852-130 MG tablet Take 1 tablet (250 mg total) by mouth 2 (two) times daily. 60 tablet 2  . potassium chloride (K-DUR) 10 MEQ tablet Take 1 tablet (10 mEq total) by mouth daily. 30 tablet 2  . temazepam (RESTORIL) 15 MG capsule Take 1 capsule (15 mg total) by mouth at bedtime as needed for sleep. 30 capsule 0  . clonazePAM (KLONOPIN) 0.5 MG tablet Take 1 tablet (0.5 mg total) by mouth 3 (three) times daily as needed for anxiety. (Patient not taking: Reported on 02/03/2016) 90 tablet 2  . furosemide (LASIX) 20 MG tablet TAKE ONE TABLET BY MOUTH EVERY OTHER DAYAS NEEDED FOR SWELLING (Patient not taking: Reported on 02/03/2016) 30 tablet 1  . ondansetron (ZOFRAN) 4 MG tablet Take 1 tablet (4 mg total)  by mouth every 8 (eight) hours as needed for nausea or vomiting. (Patient not taking: Reported on 02/03/2016) 20 tablet 1  . oxycodone (OXY-IR) 5 MG capsule Take 1 tablet every 6 hours by mouth as needed for pain (Patient not taking: Reported on 02/03/2016) 60 capsule 0   No current facility-administered medications for this visit.     Review of Systems:  GENERAL:  Feels pretty good.  No fevers or sweats.  Weight stable. PERFORMANCE STATUS (ECOG):  0 HEENT:  Distant vision issues.  Dry eyes.  No runny nose, sore throat, mouth sores or tenderness. Lungs: No shortness of breath.  Chronic cough.  No hemoptysis. Cardiac:  No chest pain, palpitations, orthopnea, or PND.  Blood pressure up as worried about scans GI:  No nausea, vomiting, diarrhea, constipation,  melena or hematochezia. GU:  No urgency, frequency, dysuria, or hematuria. Musculoskeletal: No shoulder pain.  No back pain.  No joint pain.  No muscle tenderness. Extremities:  No pain or swelling. Skin:  No rashes or skin changes. Neuro:  No headache, numbness or weakness, balance or coordination issues. Endocrine:  No diabetes, thyroid issues, hot flashes or night sweats. Psych:  Insomnia improved on Restoril.  No mood changes, depression or anxiety. Pain:  No focal pain.   Review of systems:  All other systems reviewed and found to be negative.   Physical Exam: Blood pressure (!) 158/83, pulse 79, temperature 97.1 F (36.2 C), temperature source Tympanic, resp. rate 18, weight 117 lb 2 oz (53.1 kg). GENERAL:  Thin woman sitting comfortably in the exam room in no acute distress. MENTAL STATUS:  Alert and oriented to person, place and time. HEAD:  Short styled gray hair.  Normocephalic, atraumatic, face symmetric, no Cushingoid features. EYES: Hazel eyes.  Pupils equal round and reactive to light and accomodation.  No conjunctivitis or scleral icterus. ENT:  Oropharynx clear without lesion.  Tongue normal. Mucous membranes moist.  RESPIRATORY:  Clear to auscultation without rales, wheezes or rhonchi. CARDIOVASCULAR:  Regular rate and rhythm without murmur, rub or gallop.  No JVD. BREASTS: Right breast with well healed incision fixed to chest wall.  No discrete masses, skin changes or nipple discharge.  Left breast without masses, skin changes or nipple discharge. ABDOMEN:  Soft, non-tender, with active bowel sounds, and no hepatosplenomegaly.  No masses. SKIN:  No rashes, ulcers or lesions. EXTREMITIES: No swelling.  No skin discoloration or tenderness.  No palpable cords. LYMPH NODES: No palpable cervical, supraclavicular, axillary or inguinal adenopathy  NEUROLOGICAL:  Alert & oriented, cranial nerves II-XII intact; motor strength 5/5 throughout; sensation intact; finger to nose and RAM  normal; able to walk heel to toe; negative Rhomberg; no clonus or Babinski.  PSYCH:  Appropriate.    Infusion on 04/14/2016  Component Date Value Ref Range Status  . WBC 04/14/2016 5.1  3.6 - 11.0 K/uL Final  . RBC 04/14/2016 3.73* 3.80 - 5.20 MIL/uL Final  . Hemoglobin 04/14/2016 12.6  12.0 - 16.0 g/dL Final  . HCT 04/14/2016 35.8  35.0 - 47.0 % Final  . MCV 04/14/2016 96.1  80.0 - 100.0 fL Final  . MCH 04/14/2016 33.9  26.0 - 34.0 pg Final  . MCHC 04/14/2016 35.3  32.0 - 36.0 g/dL Final  . RDW 04/14/2016 15.0* 11.5 - 14.5 % Final  . Platelets 04/14/2016 227  150 - 440 K/uL Final  . Neutrophils Relative % 04/14/2016 71  % Final  . Neutro Abs 04/14/2016 3.6  1.4 - 6.5 K/uL  Final  . Lymphocytes Relative 04/14/2016 18  % Final  . Lymphs Abs 04/14/2016 0.9* 1.0 - 3.6 K/uL Final  . Monocytes Relative 04/14/2016 9  % Final  . Monocytes Absolute 04/14/2016 0.4  0.2 - 0.9 K/uL Final  . Eosinophils Relative 04/14/2016 1  % Final  . Eosinophils Absolute 04/14/2016 0.1  0 - 0.7 K/uL Final  . Basophils Relative 04/14/2016 1  % Final  . Basophils Absolute 04/14/2016 0.0  0 - 0.1 K/uL Final  . Sodium 04/14/2016 137  135 - 145 mmol/L Final  . Potassium 04/14/2016 3.8  3.5 - 5.1 mmol/L Final  . Chloride 04/14/2016 104  101 - 111 mmol/L Final  . CO2 04/14/2016 26  22 - 32 mmol/L Final  . Glucose, Bld 04/14/2016 92  65 - 99 mg/dL Final  . BUN 04/14/2016 15  6 - 20 mg/dL Final  . Creatinine, Ser 04/14/2016 0.64  0.44 - 1.00 mg/dL Final  . Calcium 04/14/2016 9.6  8.9 - 10.3 mg/dL Final  . Total Protein 04/14/2016 8.3* 6.5 - 8.1 g/dL Final  . Albumin 04/14/2016 3.5  3.5 - 5.0 g/dL Final  . AST 04/14/2016 51* 15 - 41 U/L Final  . ALT 04/14/2016 27  14 - 54 U/L Final  . Alkaline Phosphatase 04/14/2016 81  38 - 126 U/L Final  . Total Bilirubin 04/14/2016 1.3* 0.3 - 1.2 mg/dL Final  . GFR calc non Af Amer 04/14/2016 >60  >60 mL/min Final  . GFR calc Af Amer 04/14/2016 >60  >60 mL/min Final    Comment: (NOTE) The eGFR has been calculated using the CKD EPI equation. This calculation has not been validated in all clinical situations. eGFR's persistently <60 mL/min signify possible Chronic Kidney Disease.   . Anion gap 04/14/2016 7  5 - 15 Final  . Magnesium 04/14/2016 1.9  1.7 - 2.4 mg/dL Final  . Bilirubin, Direct 04/14/2016 0.2  0.1 - 0.5 mg/dL Final    Assessment:  JAYNELL CASTAGNOLA is a 66 y.o. female with metastatic Her2/neu positive right breast cancer.  She initially presented in 03/2013 with an ulcerated breast mass and back pain.  Breast biopsy revealed lobular breast cancer.  Tumor was ER/PR positive and HER-2/neu positive. PET scan revealed bone metastasis. She received palliative radiation to T8 and T10 from 04/02-04/15/2015.  She declined chemotherapy. She initially began letrozole and Tykerb. Tykerb was discontinued after 1 dose. She received letrozole from 05/02/2013 until 01/07/2014 .  She received Herceptin from 06/02/2013 until 12/15/2013. She received fulvestrant monthly from 01/07/2014 until 05/27/2014.    She has received Xgeva monthly (03/31/2013 until 08/25/2014; last 03/23/2016).  She was switched to every 6 weeks Xgeva to coordinate with her treatment.  Herceptin was discontinued for a rising CA-27-29.  CA27.29 was 916.3 on 03/19/2013, 286.2 on 06/23/2013, 70.8 on 08/25/2013, 56.3 on 10/08/2013, 102.7 on 12/15/2013, 149.4 on 01/07/2014, 178.0 on 02/04/2014, 190.7 on 03/30/2014, 143.2 on 04/22/2014, 134.8 on 05/20/2014, 248.3 on 07/09/2014, 723.2 on 08/25/2014, 252.3 on 10/08/2014, 126 on 10/29/2014, 71.3 on 11/24/2014, 55 on 12/15/2014, 70.6 on 01/05/2015, 48.6 on 01/26/2015, 52.7 on 02/15/2015, 54.7 on 03/30/2015, 54 on 04/20/2015, 55.2 on 06/01/2015, 48.8 on 06/22/2015, 49.7 08/03/2015, 44.6 on 08/24/2015, 44.5 on 09/16/2015, 44.8 on 10/07/2015, 52.1 on 10/28/2015, 47.0 on 11/18/2015, 49.9 on 12/09/2015, 46.7 on 12/30/2015, 53.3 on 02/03/2016, 49.3 on 02/24/2016, 56.3  on 03/23/2016, and 57.3 on 04/27/2016.  She received Ibrance and Faslodex from 02/23/2014 until 07/27/2014.    Echo on 09/04/2014 revealed an  EF of 55-65%.  Echo on 12/14/2014, 03/15/2015, 06/15/2015 revealed an EF of 55-60%, 50% on 09/23/2015, 60-65% on 11/16/2015, and 60-65% on 02/16/2016.   PET scan on 08/28/2014 revealed significant interval worsening and multifocal osseous metastatic disease. Lesions had increased in number and metabolic activity. There were no extra osseous metastasis.  She is s/p 26 cycles of Kadcyla (09/10/2014 - 03/23/2016).  She is tolerating treatment well.  She denies any bone pain.   PET scan on 02/08/2015 revealed a complete metabolic response in the sclerotic osseous metastases, with no residual hypermetabolic metastatic disease.  PET scan on 08/23/2015 revealed no new or progressive hypermetabolic metastatic disease.   Bone scan on 07/30/2015 revealed multifocal osseous metastases (sternum, thoracolumbar spine, multiple ribs, and right iliac crest).  The dominant left acetabular metastasis noted on prior PET was not evident. Some of the other sites of disease appear to have progressed.  There was no prior bone scan for comparison.  PET scan on 08/23/2015 revealed no new or progressive hypermetabolic disease.  There was stable mildly hypermetabolic sclerotic metastasis in the right proximal femoral metaphysis.  There was stable nonspecific mild patchy hypermetabolism in the upper and lower spine correlating with mixed lytic and sclerotic osseous metastases in the CT images  Bone scan on 02/16/2016 revealed multiple osseous metastasis with distribution of metastasis similar to that seen on the previous exam.  She was admitted from 12/22/2014 - 12/24/2014 with E coli sepsis secondary to a UTI.  Serum protein is elevated.  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  She has intermittent electrolytes issues (hypokalemia, hypomagnesemia, and hypocalcemia).   She is on supplementation.  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  Symptomatically, she notes mild visual changes (distant vision).  Exam is normal. Bilirubin is 1.3.  Potassium, magnesium, and calcium are normal on supplementation.   Plan: 1.  Labs today:  CBC with diff, CMP, direct bilirubin, Mg, CA27.29. 2.  Kadcyla today. 3.  Anticipate PET scan and echo in mid 05/2016. 4.  Continue potassium 10 meq q day. 5.  Continue magnesium 1 pill a day. 6.  Consult ophthalmology- visual changes. 7.  If eye exam negative, will schedule head MRI. 8.  RTC on 05/04/2016 for MD assess, labs (CBC with diff, CMP, direct bilirubin, CA27.29), Kadcyla, and Xgeva.   Lequita Asal, MD  04/14/2016, 9:14 AM

## 2016-04-15 LAB — CANCER ANTIGEN 27.29: CA 27.29: 57.3 U/mL — ABNORMAL HIGH (ref 0.0–38.6)

## 2016-05-04 ENCOUNTER — Inpatient Hospital Stay: Payer: Medicare Other | Attending: Oncology | Admitting: Hematology and Oncology

## 2016-05-04 ENCOUNTER — Encounter: Payer: Self-pay | Admitting: Hematology and Oncology

## 2016-05-04 ENCOUNTER — Other Ambulatory Visit: Payer: Self-pay | Admitting: Internal Medicine

## 2016-05-04 ENCOUNTER — Inpatient Hospital Stay: Payer: Medicare Other | Attending: Hematology and Oncology

## 2016-05-04 ENCOUNTER — Other Ambulatory Visit: Payer: Self-pay | Admitting: Hematology and Oncology

## 2016-05-04 VITALS — BP 136/83 | HR 83

## 2016-05-04 VITALS — BP 164/84 | HR 80 | Temp 97.1°F | Resp 18 | Wt 116.6 lb

## 2016-05-04 DIAGNOSIS — Z5112 Encounter for antineoplastic immunotherapy: Secondary | ICD-10-CM | POA: Diagnosis not present

## 2016-05-04 DIAGNOSIS — C7951 Secondary malignant neoplasm of bone: Secondary | ICD-10-CM | POA: Insufficient documentation

## 2016-05-04 DIAGNOSIS — Z170 Estrogen receptor positive status [ER+]: Principal | ICD-10-CM

## 2016-05-04 DIAGNOSIS — Z5111 Encounter for antineoplastic chemotherapy: Secondary | ICD-10-CM

## 2016-05-04 DIAGNOSIS — K589 Irritable bowel syndrome without diarrhea: Secondary | ICD-10-CM | POA: Diagnosis not present

## 2016-05-04 DIAGNOSIS — R17 Unspecified jaundice: Secondary | ICD-10-CM

## 2016-05-04 DIAGNOSIS — Z79899 Other long term (current) drug therapy: Secondary | ICD-10-CM

## 2016-05-04 DIAGNOSIS — E876 Hypokalemia: Secondary | ICD-10-CM

## 2016-05-04 DIAGNOSIS — H539 Unspecified visual disturbance: Secondary | ICD-10-CM

## 2016-05-04 DIAGNOSIS — D890 Polyclonal hypergammaglobulinemia: Secondary | ICD-10-CM | POA: Insufficient documentation

## 2016-05-04 DIAGNOSIS — C50911 Malignant neoplasm of unspecified site of right female breast: Secondary | ICD-10-CM

## 2016-05-04 DIAGNOSIS — J449 Chronic obstructive pulmonary disease, unspecified: Secondary | ICD-10-CM | POA: Diagnosis not present

## 2016-05-04 LAB — COMPREHENSIVE METABOLIC PANEL
ALT: 28 U/L (ref 14–54)
AST: 55 U/L — ABNORMAL HIGH (ref 15–41)
Albumin: 3.3 g/dL — ABNORMAL LOW (ref 3.5–5.0)
Alkaline Phosphatase: 94 U/L (ref 38–126)
Anion gap: 7 (ref 5–15)
BUN: 16 mg/dL (ref 6–20)
CO2: 25 mmol/L (ref 22–32)
Calcium: 9.4 mg/dL (ref 8.9–10.3)
Chloride: 105 mmol/L (ref 101–111)
Creatinine, Ser: 0.59 mg/dL (ref 0.44–1.00)
GFR calc Af Amer: >60 mL/min (ref >60)
GFR calc non Af Amer: >60 mL/min (ref >60)
Glucose, Bld: 92 mg/dL (ref 65–99)
Potassium: 3.6 mmol/L (ref 3.5–5.1)
Sodium: 137 mmol/L (ref 135–145)
Total Bilirubin: 1.2 mg/dL (ref 0.3–1.2)
Total Protein: 8.1 g/dL (ref 6.5–8.1)

## 2016-05-04 LAB — CBC WITH DIFFERENTIAL/PLATELET
Basophils Absolute: 0.1 10*3/uL (ref 0–0.1)
Basophils Relative: 1 %
Eosinophils Absolute: 0.1 10*3/uL (ref 0–0.7)
Eosinophils Relative: 1 %
HCT: 35.4 % (ref 35.0–47.0)
Hemoglobin: 12.4 g/dL (ref 12.0–16.0)
Lymphocytes Relative: 16 %
Lymphs Abs: 0.8 10*3/uL — ABNORMAL LOW (ref 1.0–3.6)
MCH: 33.7 pg (ref 26.0–34.0)
MCHC: 35.1 g/dL (ref 32.0–36.0)
MCV: 95.9 fL (ref 80.0–100.0)
Monocytes Absolute: 0.4 10*3/uL (ref 0.2–0.9)
Monocytes Relative: 9 %
Neutro Abs: 3.8 10*3/uL (ref 1.4–6.5)
Neutrophils Relative %: 73 %
Platelets: 226 10*3/uL (ref 150–440)
RBC: 3.69 MIL/uL — ABNORMAL LOW (ref 3.80–5.20)
RDW: 15.5 % — ABNORMAL HIGH (ref 11.5–14.5)
WBC: 5.2 10*3/uL (ref 3.6–11.0)

## 2016-05-04 LAB — MAGNESIUM: Magnesium: 1.8 mg/dL (ref 1.7–2.4)

## 2016-05-04 LAB — BILIRUBIN, DIRECT: Bilirubin, Direct: 0.3 mg/dL (ref 0.1–0.5)

## 2016-05-04 MED ORDER — HEPARIN SOD (PORK) LOCK FLUSH 100 UNIT/ML IV SOLN
500.0000 [IU] | Freq: Once | INTRAVENOUS | Status: AC
  Administered 2016-05-04: 500 [IU] via INTRAVENOUS
  Filled 2016-05-04: qty 5

## 2016-05-04 MED ORDER — DIPHENHYDRAMINE HCL 25 MG PO CAPS: 25.0000 mg | ORAL_CAPSULE | Freq: Once | ORAL | Status: AC

## 2016-05-04 MED ORDER — DENOSUMAB 120 MG/1.7ML ~~LOC~~ SOLN
120.0000 mg | Freq: Once | SUBCUTANEOUS | Status: AC
Start: 2016-05-04 — End: 2016-05-04
  Administered 2016-05-04: 120 mg via SUBCUTANEOUS
  Filled 2016-05-04: qty 1.7

## 2016-05-04 MED ORDER — ACETAMINOPHEN 325 MG PO TABS
650.0000 mg | ORAL_TABLET | Freq: Once | ORAL | Status: AC
  Administered 2016-05-04: 650 mg via ORAL
  Filled 2016-05-04: qty 2

## 2016-05-04 MED ORDER — DIPHENHYDRAMINE HCL 25 MG PO CAPS
50.0000 mg | ORAL_CAPSULE | Freq: Once | ORAL | Status: AC
  Administered 2016-05-04: 25 mg via ORAL
  Filled 2016-05-04: qty 2

## 2016-05-04 MED ORDER — SODIUM CHLORIDE 0.9 % IV SOLN
3.6000 mg/kg | Freq: Once | INTRAVENOUS | Status: AC
  Administered 2016-05-04: 200 mg via INTRAVENOUS
  Filled 2016-05-04: qty 10

## 2016-05-04 MED ORDER — SODIUM CHLORIDE 0.9 % IV SOLN
Freq: Once | INTRAVENOUS | Status: AC
  Administered 2016-05-04: 11:00:00 via INTRAVENOUS
  Filled 2016-05-04: qty 1000

## 2016-05-04 MED ORDER — SODIUM CHLORIDE 0.9% FLUSH
10.0000 mL | INTRAVENOUS | Status: DC | PRN
  Administered 2016-05-04: 10 mL via INTRAVENOUS
  Filled 2016-05-04: qty 10

## 2016-05-04 MED ORDER — TEMAZEPAM 15 MG PO CAPS: 15.0000 mg | ORAL_CAPSULE | Freq: Every evening | ORAL | 0 refills | Status: DC | PRN

## 2016-05-04 NOTE — Progress Notes (Signed)
[facility] day:  05/04/16  Chief Complaint: Elizabeth Bond is a 66 y.o. female with metastatic Her2/neu positive right breast cancer who is seen for assessment prior to cycle #28 Kadcyla.  HPI:  The patient was last seen in the medical oncology clinic on 04/14/2016.  At that time, she noted visual changes (distance).  Neurologic exam was stable. She received cycle #27 Kadcyla.  She was referred to opthalmology.  During the interim,  she has done well. She states that she feels "lovely". She states that her vision is about the same. She has not seen an ophthalmologist. She has some concerns about her insurance and Medicare.   Past Medical History:  Diagnosis Date  . Breast cancer ([place])    right  . Cancer ([place]) 2015   breast and bone  . COPD (chronic obstructive pulmonary disease) ([place])   . IBS (irritable bowel syndrome)   . Metastasis to bone ([place]) 09/01/2014    Past Surgical History:  Procedure Laterality Date  . ABDOMINAL HYSTERECTOMY  1978  . PORTACATH PLACEMENT  2015  . REPLACEMENT TOTAL KNEE Left 2004-2005?    Family History  Problem Relation Age of Onset  . Cancer Father     prostate  . Cancer Mother     Pancreatic  . Cancer Maternal Aunt     breast  . Cancer Cousin     breast  . Cancer Other     lung cancer - neice  . Cancer - Other Daughter     brain    Social History:  reports that she quit smoking about 3 years ago. Her smoking use included Cigarettes. She has a 10.00 pack-year smoking history. She has never used smokeless tobacco. She reports that she does not drink alcohol or use drugs.  Her daughter's name is IT trainer.  She lives in [place].  The patient is alone today.  Allergies:  Allergies  Allergen Reactions  . Fentanyl Other (See Comments)    "loopy and confused"    Current Medications: Current Outpatient Prescriptions  Medication Sig Dispense Refill  . acetaminophen (TYLENOL) 500 MG tablet Take  1,000 mg by mouth every 8 (eight) hours as needed for headache.    . Calcium Carb-Cholecalciferol (CALCIUM-VITAMIN D3) 600-400 MG-UNIT TABS Take 1 tablet by mouth 2 (two) times daily.    Marland Kitchen loratadine (CLARITIN) 10 MG tablet Take 10 mg by mouth daily.    . Magnesium 250 MG TABS Take 1 each by mouth daily.     . pantoprazole (PROTONIX) 40 MG tablet Take 1 tablet (40 mg total) by mouth daily. 30 tablet 6  . phosphorus (PHOSPHA 250 NEUTRAL) 155-852-130 MG tablet Take 1 tablet (250 mg total) by mouth 2 (two) times daily. 60 tablet 2  . potassium chloride (K-DUR) 10 MEQ tablet Take 1 tablet (10 mEq total) by mouth daily. 30 tablet 2  . temazepam (RESTORIL) 15 MG capsule Take 1 capsule (15 mg total) by mouth at bedtime as needed for sleep. 30 capsule 0  . clonazePAM (KLONOPIN) 0.5 MG tablet Take 1 tablet (0.5 mg total) by mouth 3 (three) times daily as needed for anxiety. (Patient not taking: Reported on 02/03/2016) 90 tablet 2  . furosemide (LASIX) 20 MG tablet TAKE ONE TABLET BY MOUTH EVERY OTHER DAYAS NEEDED FOR SWELLING (Patient not taking: Reported on 02/03/2016) 30 tablet 1  . ondansetron (ZOFRAN) 4 MG tablet Take 1 tablet (4 mg total) by mouth every 8 (eight) hours  as needed for nausea or vomiting. (Patient not taking: Reported on 02/03/2016) 20 tablet 1  . oxycodone (OXY-IR) 5 MG capsule Take 1 tablet every 6 hours by mouth as needed for pain (Patient not taking: Reported on 02/03/2016) 60 capsule 0   No current facility-administered medications for this visit.    Facility-Administered Medications Ordered in Other Visits  Medication Dose Route Frequency Provider Last Rate Last Dose  . heparin lock flush 100 unit/mL  500 Units Intravenous Once Lequita Asal, MD      . sodium chloride flush (NS) 0.9 % injection 10 mL  10 mL Intravenous PRN Lequita Asal, MD   10 mL at 05/04/16 0623    Review of Systems:  GENERAL:  Feels "lovely".  No fevers or sweats.  Weight stable. PERFORMANCE STATUS  (ECOG):  0 HEENT:  Distant vision issues (no change).  Dry eyes.  No runny nose, sore throat, mouth sores or tenderness. Lungs: No shortness of breath.  Chronic cough.  No hemoptysis. Cardiac:  No chest pain, palpitations, orthopnea, or PND.  Blood pressure up as worried about scans GI:  No nausea, vomiting, diarrhea, constipation, melena or hematochezia. GU:  No urgency, frequency, dysuria, or hematuria. Musculoskeletal:  No shoulder pain. No back pain.  No joint pain.  No muscle tenderness. Extremities:  No pain or swelling. Skin:  No rashes or skin changes. Neuro:  No headache, numbness or weakness, balance or coordination issues. Endocrine:  No diabetes, thyroid issues, hot flashes or night sweats. Psych:  Insomnia improved on Restoril.  No mood changes, depression or anxiety. Pain:  No focal pain.   Review of systems:  All other systems reviewed and found to be negative.   Physical Exam: Blood pressure (!) 163/80, pulse 80, temperature 97.1 F (36.2 C), temperature source Tympanic, resp. rate 18, weight 116 lb 9 oz (52.9 kg). GENERAL:  Thin woman sitting comfortably in the exam room in no acute distress. MENTAL STATUS:  Alert and oriented to person, place and time. HEAD:  Short styled gray hair.  Normocephalic, atraumatic, face symmetric, no Cushingoid features. EYES: Hazel eyes.  Pupils equal round and reactive to light and accomodation.  No conjunctivitis or scleral icterus. ENT:  Oropharynx clear without lesion.  Tongue normal. Mucous membranes moist.  RESPIRATORY:  Clear to auscultation without rales, wheezes or rhonchi. CARDIOVASCULAR:  Regular rate and rhythm without murmur, rub or gallop.  No JVD. ABDOMEN:  Soft, non-tender, with active bowel sounds, and no hepatosplenomegaly.  No masses. SKIN:  No rashes, ulcers or lesions. EXTREMITIES: No swelling.  No skin discoloration or tenderness.  No palpable cords. LYMPH NODES: No palpable cervical, supraclavicular, axillary or  inguinal adenopathy  NEUROLOGICAL:  Appropriate. PSYCH:  Appropriate.    Infusion on 05/04/2016  Component Date Value Ref Range Status  . WBC 05/04/2016 5.2  3.6 - 11.0 K/uL Final  . RBC 05/04/2016 3.69* 3.80 - 5.20 MIL/uL Final  . Hemoglobin 05/04/2016 12.4  12.0 - 16.0 g/dL Final  . HCT 05/04/2016 35.4  35.0 - 47.0 % Final  . MCV 05/04/2016 95.9  80.0 - 100.0 fL Final  . MCH 05/04/2016 33.7  26.0 - 34.0 pg Final  . MCHC 05/04/2016 35.1  32.0 - 36.0 g/dL Final  . RDW 05/04/2016 15.5* 11.5 - 14.5 % Final  . Platelets 05/04/2016 226  150 - 440 K/uL Final  . Neutrophils Relative % 05/04/2016 73  % Final  . Neutro Abs 05/04/2016 3.8  1.4 - 6.5 K/uL Final  .  Lymphocytes Relative 05/04/2016 16  % Final  . Lymphs Abs 05/04/2016 0.8* 1.0 - 3.6 K/uL Final  . Monocytes Relative 05/04/2016 9  % Final  . Monocytes Absolute 05/04/2016 0.4  0.2 - 0.9 K/uL Final  . Eosinophils Relative 05/04/2016 1  % Final  . Eosinophils Absolute 05/04/2016 0.1  0 - 0.7 K/uL Final  . Basophils Relative 05/04/2016 1  % Final  . Basophils Absolute 05/04/2016 0.1  0 - 0.1 K/uL Final  . Sodium 05/04/2016 137  135 - 145 mmol/L Final  . Potassium 05/04/2016 3.6  3.5 - 5.1 mmol/L Final  . Chloride 05/04/2016 105  101 - 111 mmol/L Final  . CO2 05/04/2016 25  22 - 32 mmol/L Final  . Glucose, Bld 05/04/2016 92  65 - 99 mg/dL Final  . BUN 05/04/2016 16  6 - 20 mg/dL Final  . Creatinine, Ser 05/04/2016 0.59  0.44 - 1.00 mg/dL Final  . Calcium 05/04/2016 9.4  8.9 - 10.3 mg/dL Final  . Total Protein 05/04/2016 8.1  6.5 - 8.1 g/dL Final  . Albumin 05/04/2016 3.3* 3.5 - 5.0 g/dL Final  . AST 05/04/2016 55* 15 - 41 U/L Final  . ALT 05/04/2016 28  14 - 54 U/L Final  . Alkaline Phosphatase 05/04/2016 94  38 - 126 U/L Final  . Total Bilirubin 05/04/2016 1.2  0.3 - 1.2 mg/dL Final  . GFR calc non Af Amer 05/04/2016 >60  >60 mL/min Final  . GFR calc Af Amer 05/04/2016 >60  >60 mL/min Final   Comment: (NOTE) The eGFR has been  calculated using the CKD EPI equation. This calculation has not been validated in all clinical situations. eGFR's persistently <60 mL/min signify possible Chronic Kidney Disease.   . Anion gap 05/04/2016 7  5 - 15 Final  . Bilirubin, Direct 05/04/2016 0.3  0.1 - 0.5 mg/dL Final    Assessment:  Elizabeth Bond is a 66 y.o. female with metastatic Her2/neu positive right breast cancer.  She initially presented in 03/2013 with an ulcerated breast mass and back pain.  Breast biopsy revealed lobular breast cancer.  Tumor was ER/PR positive and HER-2/neu positive. PET scan revealed bone metastasis. She received palliative radiation to T8 and T10 from 04/02-04/15/2015.  She declined chemotherapy. She initially began letrozole and Tykerb. Tykerb was discontinued after 1 dose. She received letrozole from 05/02/2013 until 01/07/2014 .  She received Herceptin from 06/02/2013 until 12/15/2013. She received fulvestrant monthly from 01/07/2014 until 05/27/2014.    She has received Xgeva monthly (03/31/2013 until 08/25/2014; last 03/23/2016).  She was switched to every 6 weeks Xgeva to coordinate with her treatment.  Herceptin was discontinued for a rising CA-27-29.  CA27.29 was 916.3 on 03/19/2013, 286.2 on 06/23/2013, 70.8 on 08/25/2013, 56.3 on 10/08/2013, 102.7 on 12/15/2013, 149.4 on 01/07/2014, 178.0 on 02/04/2014, 190.7 on 03/30/2014, 143.2 on 04/22/2014, 134.8 on 05/20/2014, 248.3 on 07/09/2014, 723.2 on 08/25/2014, 252.3 on 10/08/2014, 126 on 10/29/2014, 71.3 on 11/24/2014, 55 on 12/15/2014, 70.6 on 01/05/2015, 48.6 on 01/26/2015, 52.7 on 02/15/2015, 54.7 on 03/30/2015, 54 on 04/20/2015, 55.2 on 06/01/2015, 48.8 on 06/22/2015, 49.7 08/03/2015, 44.6 on 08/24/2015, 44.5 on 09/16/2015, 44.8 on 10/07/2015, 52.1 on 10/28/2015, 47.0 on 11/18/2015, 49.9 on 12/09/2015, 46.7 on 12/30/2015, 53.3 on 02/03/2016, 49.3 on 02/24/2016, 56.3 on 03/23/2016, 57.3 on 04/27/2016, and 65.7 on 05/04/2016.  She received Ibrance and  Faslodex from 02/23/2014 until 07/27/2014.    Echo on 09/04/2014 revealed an EF of 55-65%.  Echo on 12/14/2014, 03/15/2015, 06/15/2015 revealed  an EF of 55-60%, 50% on 09/23/2015, 60-65% on 11/16/2015, and 60-65% on 02/16/2016.   PET scan on 08/28/2014 revealed significant interval worsening and multifocal osseous metastatic disease. Lesions had increased in number and metabolic activity. There were no extra osseous metastasis.  She is s/p 27 cycles of Kadcyla (09/10/2014 - 04/14/2016).  She is tolerating treatment well.  She denies any bone pain.   PET scan on 02/08/2015 revealed a complete metabolic response in the sclerotic osseous metastases, with no residual hypermetabolic metastatic disease.  PET scan on 08/23/2015 revealed no new or progressive hypermetabolic metastatic disease.   Bone scan on 07/30/2015 revealed multifocal osseous metastases (sternum, thoracolumbar spine, multiple ribs, and right iliac crest).  The dominant left acetabular metastasis noted on prior PET was not evident. Some of the other sites of disease appear to have progressed.  There was no prior bone scan for comparison.  PET scan on 08/23/2015 revealed no new or progressive hypermetabolic disease.  There was stable mildly hypermetabolic sclerotic metastasis in the right proximal femoral metaphysis.  There was stable nonspecific mild patchy hypermetabolism in the upper and lower spine correlating with mixed lytic and sclerotic osseous metastases in the CT images  Bone scan on 02/16/2016 revealed multiple osseous metastasis with distribution of metastasis similar to that seen on the previous exam.  She was admitted from 12/22/2014 - 12/24/2014 with E coli sepsis secondary to a UTI.  Serum protein was elevated.  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  She has intermittent electrolytes issues (hypokalemia, hypomagnesemia, and hypocalcemia).  She is on supplementation.  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  Symptomatically, she notes stable mild visual changes (distant vision).  Exam is normal. Bilirubin is 1.2.  Potassium, magnesium, and calcium are normal on supplementation.   Plan: 1.  Labs today:  CBC with diff, CMP, direct bilirubin, Mg, CA27.29. 2.  Kadcyla today. 3.  Xgeva today. 4.  Echo on 05/19/2016 5.  PET scan on 05/19/2016. 6.  Continue potassium 10 meq q day. 7.  Continue magnesium 1 pill a day. 8.  Schedule appt with Mena Goes re: finances 9.  Dr. Wallace Going (? opthalmologist) - visual changes. 10.  If eye exam negative, discussed scheduling head MRI. 11.  RTC in 3 weeks for MD assessment, labs (CBC with diff, CMP, Mg, direct bilirubin, CA27.29), and Kadcyla.   Lequita Asal, MD  05/04/2016, 9:38 AM

## 2016-05-04 NOTE — Progress Notes (Signed)
Patient requesting refill for Temazepam.

## 2016-05-05 LAB — CANCER ANTIGEN 27.29: CA 27.29: 65.7 U/mL — ABNORMAL HIGH (ref 0.0–38.6)

## 2016-05-15 DIAGNOSIS — H2512 Age-related nuclear cataract, left eye: Secondary | ICD-10-CM | POA: Diagnosis not present

## 2016-05-19 ENCOUNTER — Encounter
Admission: RE | Admit: 2016-05-19 | Discharge: 2016-05-19 | Disposition: A | Discharge status: Home / Self Care | Payer: Medicare Other | Source: Ambulatory Visit | Attending: Hematology and Oncology | Admitting: Hematology and Oncology

## 2016-05-19 ENCOUNTER — Ambulatory Visit
Admission: RE | Admit: 2016-05-19 | Discharge: 2016-05-19 | Disposition: A | Discharge status: Home / Self Care | Payer: Medicare Other | Source: Ambulatory Visit | Attending: Hematology and Oncology | Admitting: Hematology and Oncology

## 2016-05-19 DIAGNOSIS — Z5111 Encounter for antineoplastic chemotherapy: Secondary | ICD-10-CM

## 2016-05-19 DIAGNOSIS — C7951 Secondary malignant neoplasm of bone: Secondary | ICD-10-CM | POA: Insufficient documentation

## 2016-05-19 DIAGNOSIS — R17 Unspecified jaundice: Secondary | ICD-10-CM

## 2016-05-19 DIAGNOSIS — Z170 Estrogen receptor positive status [ER+]: Principal | ICD-10-CM

## 2016-05-19 DIAGNOSIS — I082 Rheumatic disorders of both aortic and tricuspid valves: Secondary | ICD-10-CM | POA: Diagnosis not present

## 2016-05-19 DIAGNOSIS — E876 Hypokalemia: Secondary | ICD-10-CM | POA: Insufficient documentation

## 2016-05-19 DIAGNOSIS — R918 Other nonspecific abnormal finding of lung field: Secondary | ICD-10-CM | POA: Insufficient documentation

## 2016-05-19 DIAGNOSIS — C50911 Malignant neoplasm of unspecified site of right female breast: Secondary | ICD-10-CM | POA: Insufficient documentation

## 2016-05-19 DIAGNOSIS — C50919 Malignant neoplasm of unspecified site of unspecified female breast: Secondary | ICD-10-CM | POA: Diagnosis not present

## 2016-05-19 LAB — GLUCOSE, CAPILLARY: Glucose-Capillary: 85 mg/dL (ref 65–99)

## 2016-05-19 MED ORDER — FLUDEOXYGLUCOSE F - 18 (FDG) INJECTION
12.2000 | Freq: Once | INTRAVENOUS | Status: AC | PRN
  Administered 2016-05-19: 12.2 via INTRAVENOUS

## 2016-05-19 NOTE — Progress Notes (Signed)
*  PRELIMINARY RESULTS* Echocardiogram 2D Echocardiogram has been performed.  Elizabeth Bond 05/19/2016, 11:14 AM

## 2016-05-23 ENCOUNTER — Telehealth: Payer: Self-pay | Admitting: *Deleted

## 2016-05-23 NOTE — Telephone Encounter (Signed)
Called patient to inquire if she has a pulmonologist.  Patient states she does not.  I informed her per VO, Dr. Mike Gip  that PET shows she may have some infection in her lung.  MD to speak to Dr. Mortimer Fries if patient does not have pulmonologist.  Patient will be seen in clinic on Thursday

## 2016-05-23 NOTE — Telephone Encounter (Signed)
-----   Message from Lequita Asal, MD sent at 05/23/2016  1:40 PM EDT ----- Regarding: Can we refer her to pulmonary medicine?  Please call patient.  I do not think she has a pulmonologist.  I would like to talk to Dr Mortimer Fries to get her in quickly.  M  ----- Message ----- From: Interface, Rad Results In Sent: 05/19/2016  11:22 AM To: Lequita Asal, MD

## 2016-05-24 DIAGNOSIS — H2511 Age-related nuclear cataract, right eye: Secondary | ICD-10-CM | POA: Diagnosis not present

## 2016-05-25 ENCOUNTER — Inpatient Hospital Stay: Payer: Medicare Other

## 2016-05-25 ENCOUNTER — Telehealth: Payer: Self-pay | Admitting: Internal Medicine

## 2016-05-25 ENCOUNTER — Inpatient Hospital Stay: Payer: Medicare Other | Admitting: *Deleted

## 2016-05-25 ENCOUNTER — Inpatient Hospital Stay (HOSPITAL_BASED_OUTPATIENT_CLINIC_OR_DEPARTMENT_OTHER): Payer: Medicare Other | Admitting: Hematology and Oncology

## 2016-05-25 ENCOUNTER — Encounter: Payer: Self-pay | Admitting: Hematology and Oncology

## 2016-05-25 ENCOUNTER — Telehealth: Payer: Self-pay | Admitting: *Deleted

## 2016-05-25 VITALS — BP 166/96 | HR 81 | Temp 97.1°F | Resp 18 | Wt 116.0 lb

## 2016-05-25 DIAGNOSIS — Z5111 Encounter for antineoplastic chemotherapy: Secondary | ICD-10-CM

## 2016-05-25 DIAGNOSIS — C7951 Secondary malignant neoplasm of bone: Secondary | ICD-10-CM | POA: Diagnosis not present

## 2016-05-25 DIAGNOSIS — Z79899 Other long term (current) drug therapy: Secondary | ICD-10-CM | POA: Diagnosis not present

## 2016-05-25 DIAGNOSIS — E876 Hypokalemia: Secondary | ICD-10-CM

## 2016-05-25 DIAGNOSIS — R918 Other nonspecific abnormal finding of lung field: Secondary | ICD-10-CM

## 2016-05-25 DIAGNOSIS — C50911 Malignant neoplasm of unspecified site of right female breast: Secondary | ICD-10-CM | POA: Diagnosis not present

## 2016-05-25 DIAGNOSIS — R17 Unspecified jaundice: Secondary | ICD-10-CM

## 2016-05-25 DIAGNOSIS — D890 Polyclonal hypergammaglobulinemia: Secondary | ICD-10-CM | POA: Diagnosis not present

## 2016-05-25 DIAGNOSIS — Z170 Estrogen receptor positive status [ER+]: Secondary | ICD-10-CM

## 2016-05-25 DIAGNOSIS — Z5112 Encounter for antineoplastic immunotherapy: Secondary | ICD-10-CM | POA: Diagnosis not present

## 2016-05-25 LAB — CBC WITH DIFFERENTIAL/PLATELET
Basophils Absolute: 0 10*3/uL (ref 0–0.1)
Basophils Relative: 1 %
Eosinophils Absolute: 0 10*3/uL (ref 0–0.7)
Eosinophils Relative: 1 %
HCT: 35.9 % (ref 35.0–47.0)
Hemoglobin: 12.7 g/dL (ref 12.0–16.0)
Lymphocytes Relative: 16 %
Lymphs Abs: 0.8 10*3/uL — ABNORMAL LOW (ref 1.0–3.6)
MCH: 34.1 pg — ABNORMAL HIGH (ref 26.0–34.0)
MCHC: 35.4 g/dL (ref 32.0–36.0)
MCV: 96.4 fL (ref 80.0–100.0)
Monocytes Absolute: 0.4 10*3/uL (ref 0.2–0.9)
Monocytes Relative: 8 %
Neutro Abs: 3.6 10*3/uL (ref 1.4–6.5)
Neutrophils Relative %: 74 %
Platelets: 227 10*3/uL (ref 150–440)
RBC: 3.72 MIL/uL — ABNORMAL LOW (ref 3.80–5.20)
RDW: 15.6 % — ABNORMAL HIGH (ref 11.5–14.5)
WBC: 4.9 10*3/uL (ref 3.6–11.0)

## 2016-05-25 LAB — COMPREHENSIVE METABOLIC PANEL
ALT: 19 U/L (ref 14–54)
AST: 46 U/L — ABNORMAL HIGH (ref 15–41)
Albumin: 3.3 g/dL — ABNORMAL LOW (ref 3.5–5.0)
Alkaline Phosphatase: 79 U/L (ref 38–126)
Anion gap: 6 (ref 5–15)
BUN: 13 mg/dL (ref 6–20)
CO2: 24 mmol/L (ref 22–32)
Calcium: 8.9 mg/dL (ref 8.9–10.3)
Chloride: 104 mmol/L (ref 101–111)
Creatinine, Ser: 0.6 mg/dL (ref 0.44–1.00)
GFR calc Af Amer: >60 mL/min (ref >60)
GFR calc non Af Amer: >60 mL/min (ref >60)
Glucose, Bld: 92 mg/dL (ref 65–99)
Potassium: 3.5 mmol/L (ref 3.5–5.1)
Sodium: 134 mmol/L — ABNORMAL LOW (ref 135–145)
Total Bilirubin: 1.6 mg/dL — ABNORMAL HIGH (ref 0.3–1.2)
Total Protein: 8.3 g/dL — ABNORMAL HIGH (ref 6.5–8.1)

## 2016-05-25 LAB — BILIRUBIN, DIRECT: Bilirubin, Direct: 0.3 mg/dL (ref 0.1–0.5)

## 2016-05-25 LAB — MAGNESIUM: Magnesium: 1.8 mg/dL (ref 1.7–2.4)

## 2016-05-25 MED ORDER — HEPARIN SOD (PORK) LOCK FLUSH 100 UNIT/ML IV SOLN
500.0000 [IU] | Freq: Once | INTRAVENOUS | Status: AC
  Administered 2016-05-25: 500 [IU] via INTRAVENOUS
  Filled 2016-05-25: qty 5

## 2016-05-25 MED ORDER — SODIUM CHLORIDE 0.9% FLUSH
10.0000 mL | INTRAVENOUS | Status: DC | PRN
Start: 2016-05-25 — End: 2016-05-25
  Administered 2016-05-25: 10 mL via INTRAVENOUS
  Filled 2016-05-25: qty 10

## 2016-05-25 NOTE — Telephone Encounter (Signed)
  Elizabeth Bond,  Please call her.  She should go to the appt tomorrow.  We need to resolve her pulmonary issue (cavitating lung nodules).  M

## 2016-05-25 NOTE — Telephone Encounter (Signed)
Called patient to inform her that MD recommends she see pulmonologist tomorrow rather than waiting until June 18th.  I gave her the address for [facility].  She is calling her daughter and will decide after talking to her.  States her daughter wanted to go out of town for the weekend.  Did not promise she would go tomorrow

## 2016-05-25 NOTE — Telephone Encounter (Signed)
Tried to call Elizabeth Bond back at number listed and number will not go through. Called pt to see if she can come tomorrow at 12pm to see DS. Pt states she needs to call her daughter to see if she can bring her since she can't drive. Pt will call back to let us know if she can be here. Will await call.

## 2016-05-25 NOTE — Progress Notes (Signed)
Patient offers no complaints today. 

## 2016-05-25 NOTE — Progress Notes (Signed)
[facility] day:  05/25/2016  Chief Complaint: Elizabeth Bond is a 66 y.o. female with metastatic Her2/neu positive right breast cancer who is seen for assessment prior to cycle #29 Kadcyla.  HPI:  The patient was last seen in the medical oncology clinic on 05/04/2016.  At that time, she was doing well.  Vision was unchanged.  She had not seen an ophthalmologist.  She received Kadcyla and Xgeva.  We discussed restaging PET scan and follow-up echo.  PET scan on 05/19/2016 revealed similar appearance of osseous metastasis with mild decrease in the index lesion hypermetabolism.  There was no evidence of soft tissue metastasis.  There was the development of innumerable, partially cavitary upper lobe predominant pulmonary nodules. Considerations included atypical infection (including fungal), septic emboli, and inflammatory etiologies such as Langerhans cell histiocytosis.  Echo on 05/19/2016 revealed an EF of 60-65%.  She is scheduled to have cataract surgery on the right on 06/06/2016 and on the left 3 weeks later.  Symptomatically, she denies any respiratory symptoms.  She denies any exposure to TB.  She denies any fever.   Past Medical History:  Diagnosis Date  . Arthritis   . Breast cancer ([place])    right  . Cancer ([place]) 2015   breast and bone  . COPD (chronic obstructive pulmonary disease) ([place])   . Depression   . GERD (gastroesophageal reflux disease)   . IBS (irritable bowel syndrome)   . Metastasis to bone ([place]) 09/01/2014    Past Surgical History:  Procedure Laterality Date  . ABDOMINAL HYSTERECTOMY  1978  . CATARACT EXTRACTION W/PHACO Right 06/06/2016   Procedure: CATARACT EXTRACTION PHACO AND INTRAOCULAR LENS PLACEMENT (IOC);  Surgeon: Birder Robson, MD;  Location: [facility] ORS;  Service: Ophthalmology;  Laterality: Right;  [place] 00:51.1 AP% 14.0 CDE 7.16 Fluid pack lot # [id number]  . FLEXIBLE BRONCHOSCOPY N/A 05/31/2016   Procedure:  FLEXIBLE BRONCHOSCOPY;  Surgeon: Wilhelmina Mcardle, MD;  Location: [facility] ORS;  Service: Pulmonary;  Laterality: N/A;  . JOINT REPLACEMENT    . PORTACATH PLACEMENT  2015  . REPLACEMENT TOTAL KNEE Left 2004-2005?    Family History  Problem Relation Age of Onset  . Cancer Father        prostate  . Cancer Mother        Pancreatic  . Cancer Maternal Aunt        breast  . Cancer Cousin        breast  . Cancer Other        lung cancer - neice  . Cancer - Other Daughter        brain    Social History:  reports that she has been smoking Cigarettes.  She has a 10.00 pack-year smoking history. She has never used smokeless tobacco. She reports that she does not drink alcohol or use drugs.  Her daughter's name is IT trainer.  She lives in [place].  The patient is accompanied by her daughter today.  Allergies:  Allergies  Allergen Reactions  . Fentanyl Palpitations and Other (See Comments)    "loopy and confused" "feels like heart is going to come out of my chest"    Current Medications: Current Outpatient Prescriptions  Medication Sig Dispense Refill  . Calcium Carb-Cholecalciferol (CALCIUM-VITAMIN D3) 600-400 MG-UNIT TABS Take 1 tablet by mouth 2 (two) times daily.    Marland Kitchen loratadine (CLARITIN) 10 MG tablet Take 10 mg by mouth daily as needed for allergies.     Marland Kitchen  pantoprazole (PROTONIX) 40 MG tablet Take 1 tablet (40 mg total) by mouth daily. 30 tablet 6  . phosphorus (PHOSPHA 250 NEUTRAL) 155-852-130 MG tablet Take 1 tablet (250 mg total) by mouth 2 (two) times daily. 60 tablet 2  . potassium chloride (K-DUR) 10 MEQ tablet Take 1 tablet (10 mEq total) by mouth daily. 30 tablet 2  . acetaminophen (TYLENOL) 325 MG tablet Take 650 mg by mouth daily as needed for headache.    . Magnesium 500 MG TABS Take 500 mg by mouth daily.     . temazepam (RESTORIL) 15 MG capsule TAKE 1 CAPSULE AT BEDTIME AS NEEDED FOR SLEEP 30 capsule 1   No current facility-administered medications for this visit.      Review of Systems:  GENERAL:  Feels "fine".  No fevers or sweats.  Weight stable. PERFORMANCE STATUS (ECOG):  0 HEENT:  Distant vision issues (no change).  Dry eyes.  No runny nose, sore throat, mouth sores or tenderness. Lungs: No shortness of breath.  Chronic cough.  No hemoptysis. Cardiac:  No chest pain, palpitations, orthopnea, or PND.  Blood pressure up as worried about scans GI:  No nausea, vomiting, diarrhea, constipation, melena or hematochezia. GU:  No urgency, frequency, dysuria, or hematuria. Musculoskeletal:  No shoulder pain. No back pain.  No joint pain.  No muscle tenderness. Extremities:  No pain or swelling. Skin:  No rashes or skin changes. Neuro:  No headache, numbness or weakness, balance or coordination issues. Endocrine:  No diabetes, thyroid issues, hot flashes or night sweats. Psych:  Insomnia improved on Restoril.  No mood changes, depression or anxiety. Pain:  No focal pain.   Review of systems:  All other systems reviewed and found to be negative.   Physical Exam: Blood pressure (!) 166/96, pulse 81, temperature 97.1 F (36.2 C), temperature source Tympanic, resp. rate 18, weight 116 lb (52.6 kg). GENERAL:  Thin woman sitting comfortably in the exam room in no acute distress. MENTAL STATUS:  Alert and oriented to person, place and time. HEAD:  Short styled gray hair.  Normocephalic, atraumatic, face symmetric, no Cushingoid features. EYES: Hazel eyes.  Pupils equal round and reactive to light and accomodation.  No conjunctivitis or scleral icterus. ENT:  Oropharynx clear without lesion.  Tongue normal. Mucous membranes moist.  RESPIRATORY:  Clear to auscultation without rales, wheezes or rhonchi. CARDIOVASCULAR:  Regular rate and rhythm without murmur, rub or gallop.  No JVD. ABDOMEN:  Soft, non-tender, with active bowel sounds, and no hepatosplenomegaly.  No masses. SKIN:  No rashes, ulcers or lesions. EXTREMITIES: No swelling.  No skin discoloration or  tenderness.  No palpable cords. LYMPH NODES: No palpable cervical, supraclavicular, axillary or inguinal adenopathy  NEUROLOGICAL:  Appropriate. PSYCH:  Appropriate.    Infusion on 05/25/2016  Component Date Value Ref Range Status  . WBC 05/25/2016 4.9  3.6 - 11.0 K/uL Final  . RBC 05/25/2016 3.72* 3.80 - 5.20 MIL/uL Final  . Hemoglobin 05/25/2016 12.7  12.0 - 16.0 g/dL Final  . HCT 05/25/2016 35.9  35.0 - 47.0 % Final  . MCV 05/25/2016 96.4  80.0 - 100.0 fL Final  . MCH 05/25/2016 34.1* 26.0 - 34.0 pg Final  . MCHC 05/25/2016 35.4  32.0 - 36.0 g/dL Final  . RDW 05/25/2016 15.6* 11.5 - 14.5 % Final  . Platelets 05/25/2016 227  150 - 440 K/uL Final  . Neutrophils Relative % 05/25/2016 74  % Final  . Neutro Abs 05/25/2016 3.6  1.4 -  6.5 K/uL Final  . Lymphocytes Relative 05/25/2016 16  % Final  . Lymphs Abs 05/25/2016 0.8* 1.0 - 3.6 K/uL Final  . Monocytes Relative 05/25/2016 8  % Final  . Monocytes Absolute 05/25/2016 0.4  0.2 - 0.9 K/uL Final  . Eosinophils Relative 05/25/2016 1  % Final  . Eosinophils Absolute 05/25/2016 0.0  0 - 0.7 K/uL Final  . Basophils Relative 05/25/2016 1  % Final  . Basophils Absolute 05/25/2016 0.0  0 - 0.1 K/uL Final  . Sodium 05/25/2016 134* 135 - 145 mmol/L Final  . Potassium 05/25/2016 3.5  3.5 - 5.1 mmol/L Final  . Chloride 05/25/2016 104  101 - 111 mmol/L Final  . CO2 05/25/2016 24  22 - 32 mmol/L Final  . Glucose, Bld 05/25/2016 92  65 - 99 mg/dL Final  . BUN 05/25/2016 13  6 - 20 mg/dL Final  . Creatinine, Ser 05/25/2016 0.60  0.44 - 1.00 mg/dL Final  . Calcium 05/25/2016 8.9  8.9 - 10.3 mg/dL Final  . Total Protein 05/25/2016 8.3* 6.5 - 8.1 g/dL Final  . Albumin 05/25/2016 3.3* 3.5 - 5.0 g/dL Final  . AST 05/25/2016 46* 15 - 41 U/L Final  . ALT 05/25/2016 19  14 - 54 U/L Final  . Alkaline Phosphatase 05/25/2016 79  38 - 126 U/L Final  . Total Bilirubin 05/25/2016 1.6* 0.3 - 1.2 mg/dL Final  . GFR calc non Af Amer 05/25/2016 >60  >60 mL/min  Final  . GFR calc Af Amer 05/25/2016 >60  >60 mL/min Final   Comment: (NOTE) The eGFR has been calculated using the CKD EPI equation. This calculation has not been validated in all clinical situations. eGFR's persistently <60 mL/min signify possible Chronic Kidney Disease.   . Anion gap 05/25/2016 6  5 - 15 Final  . Magnesium 05/25/2016 1.8  1.7 - 2.4 mg/dL Final  . CA 27.29 05/25/2016 73.9* 0.0 - 38.6 U/mL Final   Comment: (NOTE) Bayer Centaur/ACS methodology Performed At: [facility] [place], [place] [id number] Lindon Romp MD HT:[id number]   . Bilirubin, Direct 05/25/2016 0.3  0.1 - 0.5 mg/dL Final  . Anit Nuclear Antibody(ANA) 05/25/2016 Negative  Negative Final   Comment: (NOTE) Performed At: [facility] [place], [place] [id number] Lindon Romp MD RC:[id number]   . QUANTIFERON INCUBATION 05/25/2016 Comment   Final   Comment: (NOTE) Specimen incubated at [place], [place], [place]. Performed At: [facility] [place], [place] [id number] Lindon Romp MD GN:[id number]   . QUANTIFERON TB GOLD 05/25/2016 Negative  Negative Final  . QUANTIFERON CRITERIA 05/25/2016 Comment   Final   Comment: (NOTE) To be considered positive a specimen should have a TB Ag minus Nil value greater than or equal to 0.35 IU/mL and in addition the TB Ag minus Nil value must be greater than or equal to 25% of the Nil value. There may be insufficient information in these values to differentiate between some negative and some indeterminate test values.   . QUANTIFERON TB AG VALUE 05/25/2016 0.05  IU/mL Final  . Quantiferon Nil Value 05/25/2016 0.05  IU/mL Final  . QUANTIFERON MITOGEN VALUE 05/25/2016 2.51  IU/mL Final  . QFT TB AG MINUS NIL VALUE 05/25/2016 0.00  IU/mL Final  . Interpretation: 05/25/2016 Comment   Final   Comment: (NOTE) The QuantiFERON TB Gold (in Tube) assay is intended for use as an aid in the  diagnosis of TB infection. Negative results suggest  that there is no TB infection. In patients with high suspicion of exposure, a negative test should be repeated. A positive test indicates infection with Mycobacterium tuberculosis. Among individuals without tuberculosis infection, a positive test may be due to exposure to [place], M. szulgai or M. marinum. On the Internet, go to https://figueroa-lambert.info/ for further details. Performed At: [facility] [place], [place] [id number] Lindon Romp MD QI:[id number]   . Pneumocystis Smear, DFA 05/31/2016 Negative  Negative Final   Comment: (NOTE) Performed At: [facility] [place], [place] [id number] Lindon Romp MD JJ:[id number]   . Source of Sample 05/31/2016 BRONCHIAL ALVEOLAR LAVAGE   Corrected   CORRECTED ON 05/30 AT 2122: PREVIOUSLY REPORTED AS BAL, CORRECTED ON 05/30 AT 2121: PREVIOUSLY REPORTED AS BRONCHIAL BRUSHING  Clinical Support on 05/25/2016  Component Date Value Ref Range Status  . C-ANCA 05/25/2016 <1:20  Neg:<1:20 titer Final  . P-ANCA 05/25/2016 <1:20  Neg:<1:20 titer Final   Comment: (NOTE) The presence of positive fluorescence exhibiting P-ANCA or C-ANCA patterns alone is not specific for the diagnosis of Wegener's Granulomatosis (WG) or microscopic polyangiitis. Decisions about treatment should not be based solely on ANCA IFA results.  The International ANCA Group Consensus recommends follow up testing of positive sera with both PR-3 and MPO-ANCA enzyme immunoassays. As many as 5% serum samples are positive only by EIA. Ref. AM J Clin Pathol 1999;111:507-513.   Marland Kitchen Atypical P-ANCA titer 05/25/2016 <1:20  Neg:<1:20 titer Final   Comment: (NOTE) The atypical pANCA pattern has been observed in a significant percentage of patients with ulcerative colitis, primary sclerosing cholangitis and autoimmune hepatitis. Performed At: [facility] [address] [place], [place]  [id number] Lindon Romp MD FT:[id number]     Assessment:  Elizabeth Bond is a 67 y.o. female with metastatic Her2/neu positive right breast cancer.  She initially presented in 03/2013 with an ulcerated breast mass and back pain.  Breast biopsy revealed lobular breast cancer.  Tumor was ER/PR positive and HER-2/neu positive. PET scan revealed bone metastasis. She received palliative radiation to T8 and T10 from 04/02-04/15/2015.  She declined chemotherapy. She initially began letrozole and Tykerb. Tykerb was discontinued after 1 dose. She received letrozole from 05/02/2013 until 01/07/2014 .  She received Herceptin from 06/02/2013 until 12/15/2013. She received fulvestrant monthly from 01/07/2014 until 05/27/2014.    She has received Xgeva monthly (03/31/2013 until 08/25/2014; last 03/23/2016).  She was switched to every 6 weeks Xgeva to coordinate with her treatment.  Herceptin was discontinued for a rising CA-27-29.  CA27.29 was 916.3 on 03/19/2013, 286.2 on 06/23/2013, 70.8 on 08/25/2013, 56.3 on 10/08/2013, 102.7 on 12/15/2013, 149.4 on 01/07/2014, 178.0 on 02/04/2014, 190.7 on 03/30/2014, 143.2 on 04/22/2014, 134.8 on 05/20/2014, 248.3 on 07/09/2014, 723.2 on 08/25/2014, 252.3 on 10/08/2014, 126 on 10/29/2014, 71.3 on 11/24/2014, 55 on 12/15/2014, 70.6 on 01/05/2015, 48.6 on 01/26/2015, 52.7 on 02/15/2015, 54.7 on 03/30/2015, 54 on 04/20/2015, 55.2 on 06/01/2015, 48.8 on 06/22/2015, 49.7 08/03/2015, 44.6 on 08/24/2015, 44.5 on 09/16/2015, 44.8 on 10/07/2015, 52.1 on 10/28/2015, 47.0 on 11/18/2015, 49.9 on 12/09/2015, 46.7 on 12/30/2015, 53.3 on 02/03/2016, 49.3 on 02/24/2016, 56.3 on 03/23/2016, 57.3 on 04/27/2016, and 65.7 on 05/04/2016.  She received Ibrance and Faslodex from 02/23/2014 until 07/27/2014.    Echo on 09/04/2014 revealed an EF of 55-65%.  Echo on 12/14/2014, 03/15/2015, 06/15/2015 revealed an EF of 55-60%, 50% on 09/23/2015, 60-65% on 11/16/2015, 60-65% on 02/16/2016, and 60-65%  on 05/19/2016.   PET  scan on 08/28/2014 revealed significant interval worsening and multifocal osseous metastatic disease. Lesions had increased in number and metabolic activity. There were no extra osseous metastasis.  She is s/p 28 cycles of Kadcyla (09/10/2014 - 05/04/2016).  She is tolerating treatment well.  She denies any bone pain.   PET scan on 02/08/2015 revealed a complete metabolic response in the sclerotic osseous metastases, with no residual hypermetabolic metastatic disease.  PET scan on 08/23/2015 revealed no new or progressive hypermetabolic metastatic disease.   Bone scan on 07/30/2015 revealed multifocal osseous metastases (sternum, thoracolumbar spine, multiple ribs, and right iliac crest).  The dominant left acetabular metastasis noted on prior PET was not evident. Some of the other sites of disease appear to have progressed.  There was no prior bone scan for comparison.  PET scan on 08/23/2015 revealed no new or progressive hypermetabolic disease.  There was stable mildly hypermetabolic sclerotic metastasis in the right proximal femoral metaphysis.  There was stable nonspecific mild patchy hypermetabolism in the upper and lower spine correlating with mixed lytic and sclerotic osseous metastases in the CT images  Bone scan on 02/16/2016 revealed multiple osseous metastasis with distribution of metastasis similar to that seen on the previous exam.  PET scan on 05/19/2016 revealed similar appearance of osseous metastasis with mild decrease in the index lesion hypermetabolism.  There was no evidence of soft tissue metastasis.  There was the development of innumerable, partially cavitary upper lobe predominant pulmonary nodules.   She was admitted from 12/22/2014 - 12/24/2014 with E coli sepsis secondary to a UTI.  Serum protein was elevated.  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  She has intermittent electrolytes issues (hypokalemia, hypomagnesemia, and  hypocalcemia).  She is on supplementation.  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  Symptomatically, she notes stable mild visual changes (distant vision).  Exam is normal. Bilirubin is 1.2.  Potassium, magnesium, and calcium are normal on supplementation.   Plan: 1.  Labs today:  CBC with diff, CMP, direct bilirubin, Mg, CA27.29. 2.  Review PET scan.  No evidence of progression.  Etiology of cavitary pulmonary lesions unclear.  Suspect infectious etiology.  Doubt reaction to [place].  Interstitial lung disease (ILD), including pneumonitis has been reported with Kadcyla. Signs and symptoms of pneumonitis include dyspnea, cough, fatigue, and pulmonary infiltrates. Patients who have dyspnea at rest (due to advance malignancy complications or comorbidity) may be at increased risk for pulmonary toxicity.  She denies any respiratory symptoms.  Suspect etiology is infectious and thus need for pulmonary referral.  Postpone chemotherapy today. 3.  Review echo.  EF normal. 4.  No chemotherapy today 5.  Consult pulmonary. 6.  Add to today's labs per pulmonary:  ANA, quantiferon gold, and ANCA titers. 7.  RTC after cleared by pulmonary for MD assessment, labs (CBC with diff, CMP, CA27.29) and Kadcyla + Xgeva (patient to call)   Lequita Asal, MD  05/25/2016

## 2016-05-25 NOTE — Telephone Encounter (Signed)
Cancer center called stating Dr Mike Gip spoke to one of our providers stating we could urgently see this patient  They were told Dr Mortimer Fries is not in today that another provider can see her   Please call them back she stated it was URGENT  Ask for her nurse Olivia Mackie

## 2016-05-25 NOTE — Telephone Encounter (Signed)
Elizabeth Bond has pulmonology appt 6/18 @ 11:15 am. They offered her tomorrow, but she declined it and the next available was not until the 18th

## 2016-05-25 NOTE — Telephone Encounter (Signed)
Spoke with daughter and pt when they called back and pt prefers to wait to be seen in June after she has eye surgery. Pt scheduled for June appt per her request. Nothing further needed.

## 2016-05-26 ENCOUNTER — Ambulatory Visit (INDEPENDENT_AMBULATORY_CARE_PROVIDER_SITE_OTHER): Payer: Medicare Other | Admitting: Pulmonary Disease

## 2016-05-26 ENCOUNTER — Encounter: Payer: Self-pay | Admitting: Pulmonary Disease

## 2016-05-26 VITALS — BP 142/82 | HR 78 | Ht 63.0 in | Wt 116.0 lb

## 2016-05-26 DIAGNOSIS — C50919 Malignant neoplasm of unspecified site of unspecified female breast: Secondary | ICD-10-CM | POA: Diagnosis not present

## 2016-05-26 DIAGNOSIS — R918 Other nonspecific abnormal finding of lung field: Secondary | ICD-10-CM

## 2016-05-26 DIAGNOSIS — F17200 Nicotine dependence, unspecified, uncomplicated: Secondary | ICD-10-CM | POA: Diagnosis not present

## 2016-05-26 LAB — ANA W/REFLEX IF POSITIVE: Anti Nuclear Antibody(ANA): NEGATIVE

## 2016-05-26 LAB — CANCER ANTIGEN 27.29: CA 27.29: 73.9 U/mL — ABNORMAL HIGH (ref 0.0–38.6)

## 2016-05-29 LAB — QUANTIFERON IN TUBE
QFT TB AG MINUS NIL VALUE: 0 IU/mL
QUANTIFERON MITOGEN VALUE: 2.51 IU/mL
QUANTIFERON TB AG VALUE: 0.05 IU/mL
QUANTIFERON TB GOLD: NEGATIVE
Quantiferon Nil Value: 0.05 IU/mL

## 2016-05-29 LAB — ANCA TITERS
Atypical P-ANCA titer: <1:20 {titer}
C-ANCA: <1:20 {titer}
P-ANCA: <1:20 {titer}

## 2016-05-29 LAB — QUANTIFERON TB GOLD ASSAY (BLOOD)

## 2016-05-30 NOTE — Progress Notes (Signed)
PULMONARY CONSULT NOTE  Requesting MD/Service: Nolon Stalls, M.D. Date of initial consultation: 05/26/16 Reason for consultation: Multiple pulmonary nodules  PT PROFILE: 66 y.o. female smoker with history of stage IV breast cancer diagnosed in 2014, undergoing chemotherapy. Underwent staging PET scan 05/19/16 with abnormalities as discussed below, referred for possible bronchoscopy  HPI:  As above. She was initially diagnosed with breast cancer 2014. It was stage IV. She has undergone radiation therapy to the spine. Per Dr. Kem Parkinson records, she was doing well on 05/04/16 and a restaging PET scan was ordered. This revealed a similar appearance of osseous metastases. However, there was a new finding of innumerable bilateral upper lobe predominant pulmonary nodules and she is referred for further evaluation of this. The patient has mild exertional dyspnea, perhaps worse in the past month. She has occasional cough with no sputum production. She denies hemoptysis, chest pain, fever, weight loss. She is a smoker, previously as much as 1-2 PPD, now 1-2 cigarettes/day.  Past Medical History:  Diagnosis Date  . Breast cancer ([place])    right  . Cancer ([place]) 2015   breast and bone  . COPD (chronic obstructive pulmonary disease) ([place])   . IBS (irritable bowel syndrome)   . Metastasis to bone ([place]) 09/01/2014    Past Surgical History:  Procedure Laterality Date  . ABDOMINAL HYSTERECTOMY  1978  . PORTACATH PLACEMENT  2015  . REPLACEMENT TOTAL KNEE Left 2004-2005?    MEDICATIONS: I have reviewed all medications and confirmed regimen as documented  Social History   Social History  . Marital status: Widowed    Spouse name: N/A  . Number of children: N/A  . Years of education: N/A   Occupational History  . Not on file.   Social History Main Topics  . Smoking status: Former Smoker    Packs/day: 0.25    Years: 40.00    Types: Cigarettes    Quit date: 04/02/2013  . Smokeless tobacco:  Never Used  . Alcohol use No  . Drug use: No  . Sexual activity: Not on file   Other Topics Concern  . Not on file   Social History Narrative  . No narrative on file    Family History  Problem Relation Age of Onset  . Cancer Father        prostate  . Cancer Mother        Pancreatic  . Cancer Maternal Aunt        breast  . Cancer Cousin        breast  . Cancer Other        lung cancer - neice  . Cancer - Other Daughter        brain    ROS: No fever, myalgias/arthralgias, unexplained weight loss or weight gain No new focal weakness or sensory deficits No otalgia, hearing loss, visual changes, nasal and sinus symptoms, mouth and throat problems No neck pain or adenopathy No abdominal pain, N/V/D, diarrhea, change in bowel pattern No dysuria, change in urinary pattern   Vitals:   05/26/16 1145 05/26/16 1146  BP:  (!) 142/82  Pulse:  78  SpO2:  97%  Weight: 116 lb (52.6 kg)   Height: 5\' 3"  (1.6 m)      EXAM:  Gen: No overt respiratory distress HEENT: NCAT, sclera white, oropharynx normal Neck: Supple without LAN, thyromegaly, JVD Lungs: breath sounds full and mildly coarse, percussion normal, no adventitious sounds Cardiovascular: RRR, no murmurs noted Abdomen: Soft, nontender, normal BS  Ext: without clubbing, cyanosis, edema Neuro: CNs grossly intact, motor and sensory intact Skin: Limited exam, no lesions noted  DATA:   BMP Latest Ref Rng & Units 05/25/2016 05/04/2016 04/14/2016  Glucose 65 - 99 mg/dL 92 92 92  BUN 6 - 20 mg/dL 13 16 15   Creatinine 0.44 - 1.00 mg/dL 0.60 0.59 0.64  Sodium 135 - 145 mmol/L 134(L) 137 137  Potassium 3.5 - 5.1 mmol/L 3.5 3.6 3.8  Chloride 101 - 111 mmol/L 104 105 104  CO2 22 - 32 mmol/L 24 25 26   Calcium 8.9 - 10.3 mg/dL 8.9 9.4 9.6    CBC Latest Ref Rng & Units 05/25/2016 05/04/2016 04/14/2016  WBC 3.6 - 11.0 K/uL 4.9 5.2 5.1  Hemoglobin 12.0 - 16.0 g/dL 12.7 12.4 12.6  Hematocrit 35.0 - 47.0 % 35.9 35.4 35.8  Platelets  150 - 440 K/uL 227 226 227    CXR:  No chest x-ray is available for my review CT/PET 05/19/16: (Personally reviewed) Development of innumerable, partially cavitary upper lobe predominant pulmonary nodules. Considerations include atypical infection (including fungal), septic emboli, and inflammatory etiologies such as Langerhans cell histiocytosis.   IMPRESSION:     ICD-9-CM ICD-10-CM   1. Smoker 305.1 F17.200   2. Multiple pulmonary nodules 793.19 R91.8   3. Breast cancer, stage IV - undergoing chemotherapy 174.9 C50.919    The greatest concern is that the pulmonary nodules represent metastatic disease. The differential diagnosis raised by the radiologist includes infections. If this were infectious - atypical or opportunistic. However, she denies any symptoms that would be consistent with this possibility (no fever, etc.). Another possibility would be a chemotherapy-related pulmonary toxicity. She is on newer agents that I am not familiar with but I am unaware of any specific pulmonary toxicities attributed to these agents.  The pulmonary nodules are innumerable throughout both upper lobes predominantly. I think the right upper lobe is slightly more affected than the left. I believe that bronchoscopy with transbronchial biopsies offers a high yield for diagnosis.   PLAN:  Bronchoscopy planned for 05/30 at 1:30 PM.   We will contact her biopsy results  I have not scheduled follow-up with her but if the bronchoscopic biopsies are nondiagnostic, I will discuss with Dr. Mike Gip the next course of action   Merton Border, MD PCCM service Mobile [phone] Pager [phone] 05/30/2016 6:00 PM

## 2016-05-31 ENCOUNTER — Encounter: Payer: Self-pay | Admitting: *Deleted

## 2016-05-31 ENCOUNTER — Ambulatory Visit
Admission: RE | Admit: 2016-05-31 | Discharge: 2016-05-31 | Disposition: A | Discharge status: Home / Self Care | Payer: Medicare Other | Source: Ambulatory Visit | Attending: Pulmonary Disease | Admitting: Pulmonary Disease

## 2016-05-31 ENCOUNTER — Encounter
Admission: RE | Disposition: A | Discharge status: Home / Self Care | Payer: Self-pay | Source: Ambulatory Visit | Attending: Pulmonary Disease

## 2016-05-31 ENCOUNTER — Ambulatory Visit: Payer: Medicare Other

## 2016-05-31 DIAGNOSIS — Z87891 Personal history of nicotine dependence: Secondary | ICD-10-CM | POA: Insufficient documentation

## 2016-05-31 DIAGNOSIS — Z96652 Presence of left artificial knee joint: Secondary | ICD-10-CM | POA: Insufficient documentation

## 2016-05-31 DIAGNOSIS — Z853 Personal history of malignant neoplasm of breast: Secondary | ICD-10-CM | POA: Insufficient documentation

## 2016-05-31 DIAGNOSIS — Z9889 Other specified postprocedural states: Secondary | ICD-10-CM

## 2016-05-31 DIAGNOSIS — R911 Solitary pulmonary nodule: Secondary | ICD-10-CM

## 2016-05-31 DIAGNOSIS — C7951 Secondary malignant neoplasm of bone: Secondary | ICD-10-CM | POA: Insufficient documentation

## 2016-05-31 DIAGNOSIS — Z923 Personal history of irradiation: Secondary | ICD-10-CM | POA: Diagnosis not present

## 2016-05-31 DIAGNOSIS — R918 Other nonspecific abnormal finding of lung field: Secondary | ICD-10-CM | POA: Diagnosis not present

## 2016-05-31 DIAGNOSIS — Z79899 Other long term (current) drug therapy: Secondary | ICD-10-CM | POA: Diagnosis not present

## 2016-05-31 HISTORY — PX: FLEXIBLE BRONCHOSCOPY: SHX5094

## 2016-05-31 SURGERY — BRONCHOSCOPY, FLEXIBLE
Anesthesia: Moderate Sedation

## 2016-05-31 MED ORDER — LIDOCAINE HCL 2 % EX GEL: 1.0000 "application " | Freq: Once | CUTANEOUS | Status: DC

## 2016-05-31 MED ORDER — SODIUM CHLORIDE 0.9 % IV SOLN: INTRAVENOUS | Status: DC

## 2016-05-31 MED ORDER — PHENYLEPHRINE HCL 0.25 % NA SOLN: 1.0000 | Freq: Four times a day (QID) | NASAL | Status: DC | PRN

## 2016-05-31 MED ORDER — BUTAMBEN-TETRACAINE-BENZOCAINE 2-2-14 % EX AERO: 1.0000 | INHALATION_SPRAY | Freq: Once | CUTANEOUS | Status: DC

## 2016-05-31 MED ORDER — MEPERIDINE HCL 50 MG/ML IJ SOLN
50.0000 mg | Freq: Once | INTRAMUSCULAR | Status: DC
  Filled 2016-05-31: qty 1

## 2016-05-31 MED ORDER — EPINEPHRINE PF 1 MG/10ML IJ SOSY
PREFILLED_SYRINGE | INTRAMUSCULAR | Status: AC | PRN
  Administered 2016-05-31: 0.1 mg via INTRAVENOUS

## 2016-05-31 MED ORDER — HEPARIN SOD (PORK) LOCK FLUSH 100 UNIT/ML IV SOLN
INTRAVENOUS | Status: AC
  Filled 2016-05-31: qty 5

## 2016-05-31 MED ORDER — MIDAZOLAM HCL 2 MG/2ML IJ SOLN
INTRAMUSCULAR | Status: AC | PRN
  Administered 2016-05-31 (×2): 2 mg via INTRAVENOUS

## 2016-05-31 MED ORDER — MIDAZOLAM HCL 5 MG/5ML IJ SOLN
INTRAMUSCULAR | Status: AC
  Filled 2016-05-31: qty 10

## 2016-05-31 MED ORDER — MEPERIDINE HCL 25 MG/ML IJ SOLN
INTRAMUSCULAR | Status: AC | PRN
  Administered 2016-05-31 (×2): 12.5 mg via INTRAVENOUS

## 2016-05-31 NOTE — H&P (View-Only) (Signed)
PULMONARY CONSULT NOTE  Requesting MD/Service: Nolon Stalls, M.D. Date of initial consultation: 05/26/16 Reason for consultation: Multiple pulmonary nodules  PT PROFILE: 66 y.o. female smoker with history of stage IV breast cancer diagnosed in 2014, undergoing chemotherapy. Underwent staging PET scan 05/19/16 with abnormalities as discussed below, referred for possible bronchoscopy  HPI:  As above. She was initially diagnosed with breast cancer 2014. It was stage IV. She has undergone radiation therapy to the spine. Per Dr. Kem Parkinson records, she was doing well on 05/04/16 and a restaging PET scan was ordered. This revealed a similar appearance of osseous metastases. However, there was a new finding of innumerable bilateral upper lobe predominant pulmonary nodules and she is referred for further evaluation of this. The patient has mild exertional dyspnea, perhaps worse in the past month. She has occasional cough with no sputum production. She denies hemoptysis, chest pain, fever, weight loss. She is a smoker, previously as much as 1-2 PPD, now 1-2 cigarettes/day.  Past Medical History:  Diagnosis Date  . Breast cancer ([place])    right  . Cancer ([place]) 2015   breast and bone  . COPD (chronic obstructive pulmonary disease) ([place])   . IBS (irritable bowel syndrome)   . 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 ABDOMINAL HYSTERECTOMY  1978  . PORTACATH PLACEMENT  2015  . REPLACEMENT TOTAL KNEE Left 2004-2005?    MEDICATIONS: I have reviewed all medications and confirmed regimen as documented  Social History   Social History  . Marital status: Widowed    Spouse name: N/A  . Number of children: N/A  . Years of education: N/A   Occupational History  . Not on file.   Social History Main Topics  . Smoking status: Former Smoker    Packs/day: 0.25    Years: 40.00    Types: Cigarettes    Quit date: 04/02/2013  . Smokeless tobacco:  Never Used  . Alcohol use No  . Drug use: No  . Sexual activity: Not on file   Other Topics Concern  . Not on file   Social History Narrative  . No narrative on file    Family History  Problem Relation Age of Onset  . Cancer Father        prostate  . Cancer Mother        Pancreatic  . Cancer Maternal Aunt        breast  . Cancer Cousin        breast  . Cancer Other        lung cancer - neice  . Cancer - Other Daughter        brain    ROS: No fever, myalgias/arthralgias, unexplained weight loss or weight gain No new focal weakness or sensory deficits No otalgia, hearing loss, visual changes, nasal and sinus symptoms, mouth and throat problems No neck pain or adenopathy No abdominal pain, N/V/D, diarrhea, change in bowel pattern No dysuria, change in urinary pattern   Vitals:   05/26/16 1145 05/26/16 1146  BP:  (!) 142/82  Pulse:  78  SpO2:  97%  Weight: 116 lb (52.6 kg)   Height: 5\' 3"  (1.6 m)      EXAM:  Gen: No overt respiratory distress HEENT: NCAT, sclera white, oropharynx normal Neck: Supple without LAN, thyromegaly, JVD Lungs: breath sounds full and mildly coarse, percussion normal, no adventitious sounds Cardiovascular: RRR, no murmurs noted Abdomen: Soft, nontender, normal BS  Ext: without clubbing, cyanosis, edema Neuro: CNs grossly intact, motor and sensory intact Skin: Limited exam, no lesions noted  DATA:   BMP Latest Ref Rng & Units 05/25/2016 05/04/2016 04/14/2016  Glucose 65 - 99 mg/dL 92 92 92  BUN 6 - 20 mg/dL 13 16 15   Creatinine 0.44 - 1.00 mg/dL 0.60 0.59 0.64  Sodium 135 - 145 mmol/L 134(L) 137 137  Potassium 3.5 - 5.1 mmol/L 3.5 3.6 3.8  Chloride 101 - 111 mmol/L 104 105 104  CO2 22 - 32 mmol/L 24 25 26   Calcium 8.9 - 10.3 mg/dL 8.9 9.4 9.6    CBC Latest Ref Rng & Units 05/25/2016 05/04/2016 04/14/2016  WBC 3.6 - 11.0 K/uL 4.9 5.2 5.1  Hemoglobin 12.0 - 16.0 g/dL 12.7 12.4 12.6  Hematocrit 35.0 - 47.0 % 35.9 35.4 35.8  Platelets  150 - 440 K/uL 227 226 227    CXR:  No chest x-ray is available for my review CT/PET 05/19/16: (Personally reviewed) Development of innumerable, partially cavitary upper lobe predominant pulmonary nodules. Considerations include atypical infection (including fungal), septic emboli, and inflammatory etiologies such as Langerhans cell histiocytosis.   IMPRESSION:     ICD-9-CM ICD-10-CM   1. Smoker 305.1 F17.200   2. Multiple pulmonary nodules 793.19 R91.8   3. Breast cancer, stage IV - undergoing chemotherapy 174.9 C50.919    The greatest concern is that the pulmonary nodules represent metastatic disease. The differential diagnosis raised by the radiologist includes infections. If this were infectious - atypical or opportunistic. However, she denies any symptoms that would be consistent with this possibility (no fever, etc.). Another possibility would be a chemotherapy-related pulmonary toxicity. She is on newer agents that I am not familiar with but I am unaware of any specific pulmonary toxicities attributed to these agents.  The pulmonary nodules are innumerable throughout both upper lobes predominantly. I think the right upper lobe is slightly more affected than the left. I believe that bronchoscopy with transbronchial biopsies offers a high yield for diagnosis.   PLAN:  Bronchoscopy planned for 05/30 at 1:30 PM.   We will contact her biopsy results  I have not scheduled follow-up with her but if the bronchoscopic biopsies are nondiagnostic, I will discuss with Dr. Mike Gip the next course of action   Merton Border, MD PCCM service Mobile [phone] Pager [phone] 05/30/2016 6:00 PM

## 2016-05-31 NOTE — Interval H&P Note (Signed)
History and Physical Interval Note:  05/31/2016 1:29 PM  Elizabeth Bond  has presented today for surgery, with the diagnosis of Lung mass Regular Bronch, [facility], [facility]  The various methods of treatment have been discussed with the patient and family. After consideration of risks, benefits and other options for treatment, the patient has consented to  Procedure(s): FLEXIBLE BRONCHOSCOPY (N/A) as a surgical intervention .  The patient's history has been reviewed, patient examined, no change in status, stable for surgery.  I have reviewed the patient's chart and labs.  Questions were answered to the patient's satisfaction.     Merton Border, MD PCCM service Mobile [phone] Pager [phone] 05/31/2016 1:29 PM

## 2016-05-31 NOTE — Procedures (Signed)
Indication:   Multiple pulmonary nodules  Premedication:   Demerol 25 mg Midaz 4 mg  Anesthesia: Topical to nose and throat 40 cc of 1% lidocaine used during the course of procedure  Procedure: After adequate sedation and anesthesia, the bronchoscope was introduced via the R naris and advanced into the posterior pharynx. Further anesthesia was obtained with 1% lidocaine and the scope was advanced into the trachea. Complete airway anesthesia was achieved with 1% lidocaine and a thorough airway examination was performed. This revealed the following findings:  Findings:  Upper airway - normal anatomy. Cords moved symmetrically Tracheobronchial anatomy: normal Bronchial mucosa - extremely vascular and hyperemic L>R. Mild chronic bronchitis changes Other - nio endobronchial masses, tumors or foreign bodies  10 cc of 1:10,000 epinephrine instilled into apical segment of RUL prior to obtaining TBBx Specimens:   BAL from RUL, anterior segment. Sent for GS/cx, fungal, AFB, pneumocystis, cytology Cytology brushings from anterior and apical segments of RUL. Sent for cytology Transbronchial biopsy (with flouroscopic guidance) from anterior and apical segments of RUL, 7-8 passes total. Sent for surgical pat  Complications: Mild cough, minimal bleeding  Post procedure evaluation:  The patient tolerated the procedure well with no major complications CXR - pending   Merton Border, MD PCCM service Mobile [phone] Pager [phone]  05/31/2016 2:25 PM

## 2016-06-01 ENCOUNTER — Encounter: Payer: Self-pay | Admitting: Pulmonary Disease

## 2016-06-01 LAB — PNEUMOCYSTIS SMEAR, DFA: Pneumocystis Smear, DFA: NEGATIVE

## 2016-06-01 LAB — CYTOLOGY - NON PAP

## 2016-06-02 LAB — ACID FAST SMEAR (AFB): ACID FAST SMEAR - AFSCU2: NEGATIVE

## 2016-06-03 LAB — CULTURE, BAL-QUANTITATIVE W GRAM STAIN: Culture: 20000 — AB

## 2016-06-03 LAB — CULTURE, BAL-QUANTITATIVE

## 2016-06-05 ENCOUNTER — Other Ambulatory Visit: Payer: Self-pay | Admitting: Hematology and Oncology

## 2016-06-05 DIAGNOSIS — R17 Unspecified jaundice: Secondary | ICD-10-CM

## 2016-06-05 DIAGNOSIS — E876 Hypokalemia: Secondary | ICD-10-CM

## 2016-06-05 DIAGNOSIS — C50911 Malignant neoplasm of unspecified site of right female breast: Secondary | ICD-10-CM

## 2016-06-05 DIAGNOSIS — Z170 Estrogen receptor positive status [ER+]: Principal | ICD-10-CM

## 2016-06-05 DIAGNOSIS — Z5111 Encounter for antineoplastic chemotherapy: Secondary | ICD-10-CM

## 2016-06-05 DIAGNOSIS — C7951 Secondary malignant neoplasm of bone: Secondary | ICD-10-CM

## 2016-06-06 ENCOUNTER — Telehealth: Payer: Self-pay | Admitting: *Deleted

## 2016-06-06 ENCOUNTER — Other Ambulatory Visit: Payer: Self-pay | Admitting: Pathology

## 2016-06-06 ENCOUNTER — Ambulatory Visit
Admission: RE | Admit: 2016-06-06 | Discharge: 2016-06-06 | Disposition: A | Discharge status: Home / Self Care | Payer: Medicare Other | Source: Ambulatory Visit | Attending: Ophthalmology | Admitting: Ophthalmology

## 2016-06-06 ENCOUNTER — Ambulatory Visit: Payer: Medicare Other | Admitting: Anesthesiology

## 2016-06-06 ENCOUNTER — Encounter
Admission: RE | Disposition: A | Discharge status: Home / Self Care | Payer: Self-pay | Source: Ambulatory Visit | Attending: Ophthalmology

## 2016-06-06 DIAGNOSIS — F329 Major depressive disorder, single episode, unspecified: Secondary | ICD-10-CM | POA: Diagnosis not present

## 2016-06-06 DIAGNOSIS — Z87891 Personal history of nicotine dependence: Secondary | ICD-10-CM | POA: Insufficient documentation

## 2016-06-06 DIAGNOSIS — K219 Gastro-esophageal reflux disease without esophagitis: Secondary | ICD-10-CM | POA: Insufficient documentation

## 2016-06-06 DIAGNOSIS — M1990 Unspecified osteoarthritis, unspecified site: Secondary | ICD-10-CM | POA: Diagnosis not present

## 2016-06-06 DIAGNOSIS — J449 Chronic obstructive pulmonary disease, unspecified: Secondary | ICD-10-CM | POA: Insufficient documentation

## 2016-06-06 DIAGNOSIS — H2511 Age-related nuclear cataract, right eye: Secondary | ICD-10-CM | POA: Insufficient documentation

## 2016-06-06 HISTORY — PX: CATARACT EXTRACTION W/PHACO: SHX586

## 2016-06-06 HISTORY — DX: Depression, unspecified: F32.A

## 2016-06-06 HISTORY — DX: Gastro-esophageal reflux disease without esophagitis: K21.9

## 2016-06-06 HISTORY — DX: Unspecified osteoarthritis, unspecified site: M19.90

## 2016-06-06 HISTORY — DX: Major depressive disorder, single episode, unspecified: F32.9

## 2016-06-06 SURGERY — PHACOEMULSIFICATION, CATARACT, WITH IOL INSERTION
Anesthesia: Monitor Anesthesia Care | Site: Eye | Laterality: Right | Wound class: Clean

## 2016-06-06 MED ORDER — SODIUM CHLORIDE 0.9 % IV SOLN
INTRAVENOUS | Status: DC
  Administered 2016-06-06: 09:00:00 via INTRAVENOUS

## 2016-06-06 MED ORDER — NA CHONDROIT SULF-NA HYALURON 40-17 MG/ML IO SOLN
INTRAOCULAR | Status: AC
  Filled 2016-06-06: qty 1

## 2016-06-06 MED ORDER — MOXIFLOXACIN HCL 0.5 % OP SOLN
OPHTHALMIC | Status: AC
  Filled 2016-06-06: qty 3

## 2016-06-06 MED ORDER — LIDOCAINE HCL (PF) 4 % IJ SOLN
INTRAOCULAR | Status: DC | PRN
  Administered 2016-06-06: 4 mL via OPHTHALMIC

## 2016-06-06 MED ORDER — MIDAZOLAM HCL 2 MG/2ML IJ SOLN
INTRAMUSCULAR | Status: DC | PRN
Start: 2016-06-06 — End: 2016-06-06
  Administered 2016-06-06 (×2): 1 mg via INTRAVENOUS

## 2016-06-06 MED ORDER — EPINEPHRINE PF 1 MG/ML IJ SOLN
INTRAOCULAR | Status: DC | PRN
  Administered 2016-06-06: 10:00:00 via OPHTHALMIC

## 2016-06-06 MED ORDER — MOXIFLOXACIN HCL 0.5 % OP SOLN: 1.0000 [drp] | OPHTHALMIC | Status: DC | PRN

## 2016-06-06 MED ORDER — NA CHONDROIT SULF-NA HYALURON 40-17 MG/ML IO SOLN
INTRAOCULAR | Status: DC | PRN
  Administered 2016-06-06: 1 mL via INTRAOCULAR

## 2016-06-06 MED ORDER — LIDOCAINE HCL (PF) 2 % IJ SOLN
INTRAMUSCULAR | Status: AC
  Filled 2016-06-06: qty 2

## 2016-06-06 MED ORDER — ARMC OPHTHALMIC DILATING DROPS
1.0000 "application " | OPHTHALMIC | Status: AC
  Administered 2016-06-06 (×3): 1 via OPHTHALMIC

## 2016-06-06 MED ORDER — MIDAZOLAM HCL 2 MG/2ML IJ SOLN
INTRAMUSCULAR | Status: AC
  Filled 2016-06-06: qty 2

## 2016-06-06 MED ORDER — CARBACHOL 0.01 % IO SOLN
INTRAOCULAR | Status: DC | PRN
  Administered 2016-06-06: 0.5 mL via INTRAOCULAR

## 2016-06-06 MED ORDER — EPINEPHRINE PF 1 MG/ML IJ SOLN
INTRAMUSCULAR | Status: AC
  Filled 2016-06-06: qty 2

## 2016-06-06 MED ORDER — POVIDONE-IODINE 5 % OP SOLN
OPHTHALMIC | Status: AC
  Filled 2016-06-06: qty 30

## 2016-06-06 MED ORDER — MOXIFLOXACIN HCL 0.5 % OP SOLN
OPHTHALMIC | Status: DC | PRN
  Administered 2016-06-06: 0.2 mL via OPHTHALMIC

## 2016-06-06 MED ORDER — ARMC OPHTHALMIC DILATING DROPS
OPHTHALMIC | Status: AC
  Filled 2016-06-06: qty 0.4

## 2016-06-06 MED ORDER — POVIDONE-IODINE 5 % OP SOLN
OPHTHALMIC | Status: DC | PRN
  Administered 2016-06-06: 1 via OPHTHALMIC

## 2016-06-06 SURGICAL SUPPLY — 14 items
GLOVE BIO SURGEON STRL SZ8 (GLOVE) ×3 IMPLANT
GLOVE BIOGEL M 6.5 STRL (GLOVE) ×3 IMPLANT
GLOVE SURG LX 8.0 MICRO (GLOVE) ×2
GLOVE SURG LX STRL 8.0 MICRO (GLOVE) ×1 IMPLANT
GOWN STRL REUS W/ TWL LRG LVL3 (GOWN DISPOSABLE) ×2 IMPLANT
GOWN STRL REUS W/TWL LRG LVL3 (GOWN DISPOSABLE) ×4
LENS IOL TECNIS ITEC 24.0 (Intraocular Lens) ×3 IMPLANT
PACK CATARACT (MISCELLANEOUS) ×3 IMPLANT
PACK CATARACT BRASINGTON LX (MISCELLANEOUS) ×3 IMPLANT
SOL BSS BAG (MISCELLANEOUS) ×3
SOLUTION BSS BAG (MISCELLANEOUS) ×1 IMPLANT
SYR 5ML LL (SYRINGE) ×3 IMPLANT
WATER STERILE IRR 250ML POUR (IV SOLUTION) ×3 IMPLANT
WIPE NON LINTING 3.25X3.25 (MISCELLANEOUS) ×3 IMPLANT

## 2016-06-06 NOTE — Telephone Encounter (Signed)
Daughter has called asking about pt's bronch results. Please advise.

## 2016-06-06 NOTE — Anesthesia Postprocedure Evaluation (Signed)
Anesthesia Post Note  Patient: Elizabeth Bond  Procedure(s) Performed: Procedure(s) (LRB): CATARACT EXTRACTION PHACO AND INTRAOCULAR LENS PLACEMENT (IOC) (Right)  Patient location during evaluation: PACU Anesthesia Type: MAC Level of consciousness: awake and alert Pain management: pain level controlled Vital Signs Assessment: post-procedure vital signs reviewed and stable Respiratory status: spontaneous breathing, nonlabored ventilation, respiratory function stable and patient connected to nasal cannula oxygen Cardiovascular status: stable and blood pressure returned to baseline Anesthetic complications: no     Last Vitals:  Vitals:   06/06/16 1025 06/06/16 1026  BP: (!) 150/69 (!) 150/69  Pulse: 73 78  Resp: 18 18  Temp: 36.9 C     Last Pain:  Vitals:   06/06/16 1026  TempSrc: Oral  PainSc: 0-No pain                 Darlyne Russian

## 2016-06-06 NOTE — H&P (Signed)
All labs reviewed. Abnormal studies sent to patients PCP when indicated.  Previous H&P reviewed, patient examined, there are NO CHANGES.  Elizabeth Sprung LOUIS6/5/20189:58 AM

## 2016-06-06 NOTE — Discharge Instructions (Signed)
Eye Surgery Discharge Instructions  Expect mild scratchy sensation or mild soreness. DO NOT RUB YOUR EYE!  The day of surgery:  Minimal physical activity, but bed rest is not required  No reading, computer work, or close hand work  No bending, lifting, or straining.  May watch TV  For 24 hours:  No driving, legal decisions, or alcoholic beverages  Safety precautions  Eat anything you prefer: It is better to start with liquids, then soup then solid foods.  _____ Eye patch should be worn until postoperative exam tomorrow.  ____ Solar shield eyeglasses should be worn for comfort in the sunlight/patch while sleeping  Resume all regular medications including aspirin or Coumadin if these were discontinued prior to surgery. You may shower, bathe, shave, or wash your hair. Tylenol may be taken for mild discomfort.  Call your doctor if you experience significant pain, nausea, or vomiting, fever > 101 or other signs of infection. [phone] or [phone] Specific instructions:  Follow-up Information    Birder Robson, MD Follow up on 06/07/2016.   Specialty:  Ophthalmology Why:  At 9:35 am. Contact information: [place] [address] [phone]

## 2016-06-06 NOTE — Op Note (Signed)
PREOPERATIVE DIAGNOSIS:  Nuclear sclerotic cataract of the right eye.   POSTOPERATIVE DIAGNOSIS:  NUCLEAR SCLEROTIC CATARACT RIGHT EYE   OPERATIVE PROCEDURE: Procedure(s): CATARACT EXTRACTION PHACO AND INTRAOCULAR LENS PLACEMENT (IOC)   SURGEON:  Birder Robson, MD.   ANESTHESIA:  Anesthesiologist: Gunnar Bulla, MD CRNA: Darlyne Russian, CRNA  1.      Managed anesthesia care. 2.      0.10ml of Shugarcaine was instilled in the eye following the paracentesis.   COMPLICATIONS:  None.   TECHNIQUE:   Stop and chop   DESCRIPTION OF PROCEDURE:  The patient was examined and consented in the preoperative holding area where the aforementioned topical anesthesia was applied to the right eye and then brought back to the Operating Room where the right eye was prepped and draped in the usual sterile ophthalmic fashion and a lid speculum was placed. A paracentesis was created with the side port blade and the anterior chamber was filled with viscoelastic. A near clear corneal incision was performed with the steel keratome. A continuous curvilinear capsulorrhexis was performed with a cystotome followed by the capsulorrhexis forceps. Hydrodissection and hydrodelineation were carried out with BSS on a blunt cannula. The lens was removed in a stop and chop  technique and the remaining cortical material was removed with the irrigation-aspiration handpiece. The capsular bag was inflated with viscoelastic and the Technis [id number]  lens was placed in the capsular bag without complication. The remaining viscoelastic was removed from the eye with the irrigation-aspiration handpiece. The wounds were hydrated. The anterior chamber was flushed with Miostat and the eye was inflated to physiologic pressure. 0.30ml of Vigamox was placed in the anterior chamber. The wounds were found to be water tight. The eye was dressed with Vigamox. The patient was given protective glasses to wear throughout the day and a shield with which to  sleep tonight. The patient was also given drops with which to begin a drop regimen today and will follow-up with me in one day.  Implant Name Type Inv. Item Serial No. Manufacturer Lot No. LRB No. Used  LENS IOL DIOP 24.0 - [id number] Intraocular Lens LENS IOL DIOP 24.0 [id number] AMO   Right 1   Procedure(s) with comments: CATARACT EXTRACTION PHACO AND INTRAOCULAR LENS PLACEMENT (IOC) (Right) - [place] 00:51.1 AP% 14.0 CDE 7.16 Fluid pack lot # [id number]  Electronically signed: [place] 06/06/2016 10:23 AM

## 2016-06-06 NOTE — Anesthesia Procedure Notes (Signed)
Procedure Name: MAC Date/Time: 06/06/2016 10:03 AM Performed by: Darlyne Russian Pre-anesthesia Checklist: Patient identified, Emergency Drugs available, Suction available, Patient being monitored and Timeout performed Oxygen Delivery Method: Nasal cannula Placement Confirmation: positive ETCO2

## 2016-06-06 NOTE — Transfer of Care (Signed)
Immediate Anesthesia Transfer of Care Note  Patient: Elizabeth Bond  Procedure(s) Performed: Procedure(s) with comments: CATARACT EXTRACTION PHACO AND INTRAOCULAR LENS PLACEMENT (IOC) (Right) - [place] 00:51.1 AP% 14.0 CDE 7.16 Fluid pack lot # [id number]  Patient Location: PACU  Anesthesia Type:MAC  Level of Consciousness: awake, alert  and oriented  Airway & Oxygen Therapy: Patient Spontanous Breathing  Post-op Assessment: Report given to RN and Post -op Vital signs reviewed and stable  Post vital signs: Reviewed and stable  Last Vitals:  Vitals:   06/06/16 0911  BP: (!) 175/76  Pulse: 80  Resp: 18  Temp: 36.6 C    Last Pain:  Vitals:   06/06/16 0911  TempSrc: Oral         Complications: No apparent anesthesia complications

## 2016-06-06 NOTE — Anesthesia Preprocedure Evaluation (Signed)
Anesthesia Evaluation  Patient identified by MRN, date of birth, ID band Patient awake    Reviewed: Allergy & Precautions, NPO status , Patient's Chart, lab work & pertinent test results, reviewed documented beta blocker date and time   Airway Mallampati: II  TM Distance: >3 FB     Dental  (+) Chipped   Pulmonary COPD, former smoker,           Cardiovascular      Neuro/Psych PSYCHIATRIC DISORDERS Depression    GI/Hepatic GERD  Controlled,  Endo/Other    Renal/GU      Musculoskeletal  (+) Arthritis ,   Abdominal   Peds  Hematology   Anesthesia Other Findings IBS. Allergic to fentanyl  Reproductive/Obstetrics                             Anesthesia Physical Anesthesia Plan  ASA: III  Anesthesia Plan:    Post-op Pain Management:    Induction:   PONV Risk Score and Plan:   Airway Management Planned:   Additional Equipment:   Intra-op Plan:   Post-operative Plan:   Informed Consent: I have reviewed the patients History and Physical, chart, labs and discussed the procedure including the risks, benefits and alternatives for the proposed anesthesia with the patient or authorized representative who has indicated his/her understanding and acceptance.     Plan Discussed with: CRNA  Anesthesia Plan Comments:         Anesthesia Quick Evaluation

## 2016-06-06 NOTE — Anesthesia Post-op Follow-up Note (Cosign Needed)
Anesthesia QCDR form completed.        

## 2016-06-07 LAB — SURGICAL PATHOLOGY

## 2016-06-07 NOTE — Telephone Encounter (Signed)
Called pt and she states she will call back to schedule the appointment once she sees what her daughter's schedule is. Informed pt about smoking cessation. Nothing further needed at this time.

## 2016-06-07 NOTE — Telephone Encounter (Signed)
I spoke with her daughter regarding the biopsy results. Please call her daughter to schedule follow-up with me at the next available appointment. I have strongly recommended smoking cessation and we will need to repeat a CT scan 6-8 weeks after she quits smoking.  Merton Border, MD PCCM service Mobile [phone] Pager [phone] 06/07/2016 3:38 PM

## 2016-06-12 DIAGNOSIS — H2512 Age-related nuclear cataract, left eye: Secondary | ICD-10-CM | POA: Diagnosis not present

## 2016-06-14 ENCOUNTER — Encounter: Payer: Self-pay | Admitting: *Deleted

## 2016-06-19 ENCOUNTER — Other Ambulatory Visit: Payer: Self-pay | Admitting: Hematology and Oncology

## 2016-06-19 ENCOUNTER — Institutional Professional Consult (permissible substitution): Payer: Medicare Other | Admitting: Internal Medicine

## 2016-06-19 DIAGNOSIS — C7951 Secondary malignant neoplasm of bone: Secondary | ICD-10-CM

## 2016-06-19 DIAGNOSIS — R17 Unspecified jaundice: Secondary | ICD-10-CM

## 2016-06-19 DIAGNOSIS — Z170 Estrogen receptor positive status [ER+]: Secondary | ICD-10-CM

## 2016-06-19 DIAGNOSIS — C50911 Malignant neoplasm of unspecified site of right female breast: Secondary | ICD-10-CM

## 2016-06-19 DIAGNOSIS — Z5111 Encounter for antineoplastic chemotherapy: Secondary | ICD-10-CM

## 2016-06-19 NOTE — Telephone Encounter (Signed)
Comprehensive metabolic panel  Order: [id number]  Status:  Final result  Visible to patient:  Yes (MyChart)  Next appt:  None  Dx:  Hypocalcemia; Encounter for antineopl...   Ref Range & Units 3wk ago  Potassium 3.5 - 5.1 mmol/L 3.5

## 2016-06-20 ENCOUNTER — Other Ambulatory Visit: Payer: Self-pay | Admitting: *Deleted

## 2016-06-20 DIAGNOSIS — Z170 Estrogen receptor positive status [ER+]: Principal | ICD-10-CM

## 2016-06-20 DIAGNOSIS — C50911 Malignant neoplasm of unspecified site of right female breast: Secondary | ICD-10-CM

## 2016-06-23 ENCOUNTER — Inpatient Hospital Stay (HOSPITAL_BASED_OUTPATIENT_CLINIC_OR_DEPARTMENT_OTHER): Payer: Medicare Other | Admitting: Hematology and Oncology

## 2016-06-23 ENCOUNTER — Inpatient Hospital Stay: Payer: Medicare Other

## 2016-06-23 ENCOUNTER — Encounter: Payer: Self-pay | Admitting: Hematology and Oncology

## 2016-06-23 ENCOUNTER — Other Ambulatory Visit: Payer: Self-pay | Admitting: Hematology and Oncology

## 2016-06-23 ENCOUNTER — Inpatient Hospital Stay: Payer: Medicare Other | Attending: Hematology and Oncology

## 2016-06-23 VITALS — BP 134/84 | HR 93 | Temp 98.0°F | Resp 18 | Wt 115.0 lb

## 2016-06-23 DIAGNOSIS — E876 Hypokalemia: Secondary | ICD-10-CM | POA: Insufficient documentation

## 2016-06-23 DIAGNOSIS — F329 Major depressive disorder, single episode, unspecified: Secondary | ICD-10-CM | POA: Insufficient documentation

## 2016-06-23 DIAGNOSIS — Z79899 Other long term (current) drug therapy: Secondary | ICD-10-CM | POA: Insufficient documentation

## 2016-06-23 DIAGNOSIS — C7951 Secondary malignant neoplasm of bone: Secondary | ICD-10-CM | POA: Insufficient documentation

## 2016-06-23 DIAGNOSIS — F17210 Nicotine dependence, cigarettes, uncomplicated: Secondary | ICD-10-CM | POA: Insufficient documentation

## 2016-06-23 DIAGNOSIS — Z923 Personal history of irradiation: Secondary | ICD-10-CM | POA: Insufficient documentation

## 2016-06-23 DIAGNOSIS — D890 Polyclonal hypergammaglobulinemia: Secondary | ICD-10-CM | POA: Diagnosis not present

## 2016-06-23 DIAGNOSIS — K219 Gastro-esophageal reflux disease without esophagitis: Secondary | ICD-10-CM | POA: Diagnosis not present

## 2016-06-23 DIAGNOSIS — C50911 Malignant neoplasm of unspecified site of right female breast: Secondary | ICD-10-CM | POA: Insufficient documentation

## 2016-06-23 DIAGNOSIS — Z170 Estrogen receptor positive status [ER+]: Secondary | ICD-10-CM | POA: Insufficient documentation

## 2016-06-23 DIAGNOSIS — K589 Irritable bowel syndrome without diarrhea: Secondary | ICD-10-CM | POA: Insufficient documentation

## 2016-06-23 DIAGNOSIS — J440 Chronic obstructive pulmonary disease with acute lower respiratory infection: Secondary | ICD-10-CM | POA: Insufficient documentation

## 2016-06-23 DIAGNOSIS — Z5111 Encounter for antineoplastic chemotherapy: Secondary | ICD-10-CM

## 2016-06-23 DIAGNOSIS — R17 Unspecified jaundice: Secondary | ICD-10-CM

## 2016-06-23 DIAGNOSIS — J849 Interstitial pulmonary disease, unspecified: Secondary | ICD-10-CM | POA: Diagnosis not present

## 2016-06-23 DIAGNOSIS — R918 Other nonspecific abnormal finding of lung field: Secondary | ICD-10-CM

## 2016-06-23 LAB — COMPREHENSIVE METABOLIC PANEL
ALT: 22 U/L (ref 14–54)
AST: 49 U/L — ABNORMAL HIGH (ref 15–41)
Albumin: 3.6 g/dL (ref 3.5–5.0)
Alkaline Phosphatase: 76 U/L (ref 38–126)
Anion gap: 10 (ref 5–15)
BUN: 22 mg/dL — ABNORMAL HIGH (ref 6–20)
CO2: 23 mmol/L (ref 22–32)
Calcium: 9.5 mg/dL (ref 8.9–10.3)
Chloride: 104 mmol/L (ref 101–111)
Creatinine, Ser: 0.78 mg/dL (ref 0.44–1.00)
GFR calc Af Amer: >60 mL/min (ref >60)
GFR calc non Af Amer: >60 mL/min (ref >60)
Glucose, Bld: 120 mg/dL — ABNORMAL HIGH (ref 65–99)
Potassium: 3.6 mmol/L (ref 3.5–5.1)
Sodium: 137 mmol/L (ref 135–145)
Total Bilirubin: 1.3 mg/dL — ABNORMAL HIGH (ref 0.3–1.2)
Total Protein: 8 g/dL (ref 6.5–8.1)

## 2016-06-23 LAB — CBC WITH DIFFERENTIAL/PLATELET
Basophils Absolute: 0 10*3/uL (ref 0–0.1)
Basophils Relative: 1 %
Eosinophils Absolute: 0.1 10*3/uL (ref 0–0.7)
Eosinophils Relative: 1 %
HCT: 38.7 % (ref 35.0–47.0)
Hemoglobin: 13.5 g/dL (ref 12.0–16.0)
Lymphocytes Relative: 19 %
Lymphs Abs: 1 10*3/uL (ref 1.0–3.6)
MCH: 33 pg (ref 26.0–34.0)
MCHC: 34.8 g/dL (ref 32.0–36.0)
MCV: 94.9 fL (ref 80.0–100.0)
Monocytes Absolute: 0.4 10*3/uL (ref 0.2–0.9)
Monocytes Relative: 8 %
Neutro Abs: 3.8 10*3/uL (ref 1.4–6.5)
Neutrophils Relative %: 71 %
Platelets: 181 10*3/uL (ref 150–440)
RBC: 4.08 MIL/uL (ref 3.80–5.20)
RDW: 16 % — ABNORMAL HIGH (ref 11.5–14.5)
WBC: 5.2 10*3/uL (ref 3.6–11.0)

## 2016-06-23 MED ORDER — K PHOS MONO-SOD PHOS DI & MONO 155-852-130 MG PO TABS: 250.0000 mg | ORAL_TABLET | Freq: Two times a day (BID) | ORAL | 2 refills | Status: AC

## 2016-06-23 NOTE — Progress Notes (Signed)
[facility] day:  06/23/2016  Chief Complaint: Elizabeth Bond is a 66 y.o. female with metastatic Her2/neu positive right breast cancer who is seen for assessment prior to cycle #29 Kadcyla.  HPI:  The patient was last seen in the medical oncology clinic on 05/25/2016.  At that time, Elizabeth Bond was postponed secondary to pulmonary nodules.  She denied any respiratory symptoms. She denied any exposure to TB.  ANA, quantiferon gold, and ANCA titers were negative.  She was referred to pulmonary medicine.  She underwent bronchoscopy on 05/31/2016. Brushings were native.  Symptomatically, she feels good. She denies any fevers. She is trying to stop smoking. She recently had her right cataract removed and plans on having her left cataract removed on 06/27/2016.  Past Medical History:  Diagnosis Date  . Arthritis   . Breast cancer ([place])    right  . Cancer ([place]) 2015   breast and bone  . COPD (chronic obstructive pulmonary disease) ([place])   . Depression   . GERD (gastroesophageal reflux disease)   . IBS (irritable bowel syndrome)   . Metastasis to bone ([place]) 09/01/2014    Past Surgical History:  Procedure Laterality Date  . ABDOMINAL HYSTERECTOMY  1978  . CATARACT EXTRACTION W/PHACO Right 06/06/2016   Procedure: CATARACT EXTRACTION PHACO AND INTRAOCULAR LENS PLACEMENT (IOC);  Surgeon: Birder Robson, MD;  Location: [facility] ORS;  Service: Ophthalmology;  Laterality: Right;  [place] 00:51.1 AP% 14.0 CDE 7.16 Fluid pack lot # [id number]  . FLEXIBLE BRONCHOSCOPY N/A 05/31/2016   Procedure: FLEXIBLE BRONCHOSCOPY;  Surgeon: Wilhelmina Mcardle, MD;  Location: [facility] ORS;  Service: Pulmonary;  Laterality: N/A;  . JOINT REPLACEMENT    . PORTACATH PLACEMENT  2015  . REPLACEMENT TOTAL KNEE Left 2004-2005?    Family History  Problem Relation Age of Onset  . Cancer Father        prostate  . Cancer Mother        Pancreatic  . Cancer Maternal Aunt        breast  .  Cancer Cousin        breast  . Cancer Other        lung cancer - neice  . Cancer - Other Daughter        brain    Social History:  reports that she has been smoking Cigarettes.  She has a 10.00 pack-year smoking history. She has never used smokeless tobacco. She reports that she does not drink alcohol or use drugs.  Her daughter's name is IT trainer.  She lives in [place].  The patient is accompanied by her daughter today.  Allergies:  Allergies  Allergen Reactions  . Fentanyl Palpitations and Other (See Comments)    "loopy and confused" "feels like heart is going to come out of my chest"    Current Medications: Current Outpatient Prescriptions  Medication Sig Dispense Refill  . acetaminophen (TYLENOL) 325 MG tablet Take 650 mg by mouth daily as needed for headache.    . Calcium Carb-Cholecalciferol (CALCIUM-VITAMIN D3) 600-400 MG-UNIT TABS Take 1 tablet by mouth 2 (two) times daily.    Marland Kitchen loratadine (CLARITIN) 10 MG tablet Take 10 mg by mouth daily as needed for allergies.     . Magnesium 500 MG TABS Take 500 mg by mouth daily.     . pantoprazole (PROTONIX) 40 MG tablet Take 1 tablet (40 mg total) by mouth daily. 30 tablet 6  . phosphorus (PHOSPHA 250 NEUTRAL) 155-852-130 MG  tablet Take 1 tablet (250 mg total) by mouth 2 (two) times daily. 60 tablet 2  . potassium chloride (K-DUR) 10 MEQ tablet TAKE ONE TABLET BY MOUTH EVERY DAY 30 tablet 2  . temazepam (RESTORIL) 15 MG capsule TAKE 1 CAPSULE AT BEDTIME AS NEEDED FOR SLEEP 30 capsule 1  . potassium chloride (K-DUR) 10 MEQ tablet TAKE ONE TABLET BY MOUTH EVERY DAY 30 tablet 2   No current facility-administered medications for this visit.     Review of Systems:  GENERAL:  Feels "good".  No fevers or sweats.  Weight up 1 pound. PERFORMANCE STATUS (ECOG):  0 HEENT:  Vision improved s/p cataract surgery.  Dry eyes.  No runny nose, sore throat, mouth sores or tenderness. Lungs: No shortness of breath.  Chronic cough.  No  hemoptysis. Cardiac:  No chest pain, palpitations, orthopnea, or PND.  Blood pressure up as worried about scans GI:  No nausea, vomiting, diarrhea, constipation, melena or hematochezia. GU:  No urgency, frequency, dysuria, or hematuria. Musculoskeletal:  No shoulder pain. No back pain.  No joint pain.  No muscle tenderness. Extremities:  No pain or swelling. Skin:  No rashes or skin changes. Neuro:  No headache, numbness or weakness, balance or coordination issues. Endocrine:  No diabetes, thyroid issues, hot flashes or night sweats. Psych:  Insomnia improved on Restoril.  No mood changes, depression or anxiety. Pain:  No focal pain.   Review of systems:  All other systems reviewed and found to be negative.   Physical Exam: Blood pressure 134/84, pulse 93, temperature 98 F (36.7 C), resp. rate 18, weight 115 lb (52.2 kg). GENERAL:  Thin woman sitting comfortably in the exam room in no acute distress. MENTAL STATUS:  Alert and oriented to person, place and time. HEAD:  Short styled gray hair.  Normocephalic, atraumatic, face symmetric, no Cushingoid features. EYES: Hazel eyes.  Pupils equal round and reactive to light and accomodation.  No conjunctivitis or scleral icterus. ENT:  Oropharynx clear without lesion.  Tongue normal. Mucous membranes moist.  RESPIRATORY:  Clear to auscultation without rales, wheezes or rhonchi. CARDIOVASCULAR:  Regular rate and rhythm without murmur, rub or gallop.  No JVD. ABDOMEN:  Soft, non-tender, with active bowel sounds, and no hepatosplenomegaly.  No masses. SKIN:  No rashes, ulcers or lesions. EXTREMITIES: No swelling.  No skin discoloration or tenderness.  No palpable cords. LYMPH NODES: No palpable cervical, supraclavicular, axillary or inguinal adenopathy  NEUROLOGICAL:  Appropriate. PSYCH:  Appropriate.    Appointment on 06/23/2016  Component Date Value Ref Range Status  . WBC 06/23/2016 5.2  3.6 - 11.0 K/uL Final  . RBC 06/23/2016 4.08  3.80  - 5.20 MIL/uL Final  . Hemoglobin 06/23/2016 13.5  12.0 - 16.0 g/dL Final  . HCT 06/23/2016 38.7  35.0 - 47.0 % Final  . MCV 06/23/2016 94.9  80.0 - 100.0 fL Final  . MCH 06/23/2016 33.0  26.0 - 34.0 pg Final  . MCHC 06/23/2016 34.8  32.0 - 36.0 g/dL Final  . RDW 06/23/2016 16.0* 11.5 - 14.5 % Final  . Platelets 06/23/2016 181  150 - 440 K/uL Final  . Neutrophils Relative % 06/23/2016 71  % Final  . Neutro Abs 06/23/2016 3.8  1.4 - 6.5 K/uL Final  . Lymphocytes Relative 06/23/2016 19  % Final  . Lymphs Abs 06/23/2016 1.0  1.0 - 3.6 K/uL Final  . Monocytes Relative 06/23/2016 8  % Final  . Monocytes Absolute 06/23/2016 0.4  0.2 - 0.9  K/uL Final  . Eosinophils Relative 06/23/2016 1  % Final  . Eosinophils Absolute 06/23/2016 0.1  0 - 0.7 K/uL Final  . Basophils Relative 06/23/2016 1  % Final  . Basophils Absolute 06/23/2016 0.0  0 - 0.1 K/uL Final  . Sodium 06/23/2016 137  135 - 145 mmol/L Final  . Potassium 06/23/2016 3.6  3.5 - 5.1 mmol/L Final  . Chloride 06/23/2016 104  101 - 111 mmol/L Final  . CO2 06/23/2016 23  22 - 32 mmol/L Final  . Glucose, Bld 06/23/2016 120* 65 - 99 mg/dL Final  . BUN 06/23/2016 22* 6 - 20 mg/dL Final  . Creatinine, Ser 06/23/2016 0.78  0.44 - 1.00 mg/dL Final  . Calcium 06/23/2016 9.5  8.9 - 10.3 mg/dL Final  . Total Protein 06/23/2016 8.0  6.5 - 8.1 g/dL Final  . Albumin 06/23/2016 3.6  3.5 - 5.0 g/dL Final  . AST 06/23/2016 49* 15 - 41 U/L Final  . ALT 06/23/2016 22  14 - 54 U/L Final  . Alkaline Phosphatase 06/23/2016 76  38 - 126 U/L Final  . Total Bilirubin 06/23/2016 1.3* 0.3 - 1.2 mg/dL Final  . GFR calc non Af Amer 06/23/2016 >60  >60 mL/min Final  . GFR calc Af Amer 06/23/2016 >60  >60 mL/min Final   Comment: (NOTE) The eGFR has been calculated using the CKD EPI equation. This calculation has not been validated in all clinical situations. eGFR's persistently <60 mL/min signify possible Chronic Kidney Disease.   . Anion gap 06/23/2016 10  5  - 15 Final    Assessment:  Elizabeth Bond is a 66 y.o. female with metastatic Her2/neu positive right breast cancer.  She initially presented in 03/2013 with an ulcerated breast mass and back pain.  Breast biopsy revealed lobular breast cancer.  Tumor was ER/PR positive and HER-2/neu positive. PET scan revealed bone metastasis. She received palliative radiation to T8 and T10 from 04/02-04/15/2015.  She declined chemotherapy. She initially began letrozole and Tykerb. Tykerb was discontinued after 1 dose. She received letrozole from 05/02/2013 until 01/07/2014 .  She received Herceptin from 06/02/2013 until 12/15/2013. She received fulvestrant monthly from 01/07/2014 until 05/27/2014.    She has received Xgeva monthly (03/31/2013 until 08/25/2014; last 03/23/2016).  She was switched to every 6 weeks Xgeva to coordinate with her treatment.  Herceptin was discontinued for a rising CA-27-29.  CA27.29 was 916.3 on 03/19/2013, 286.2 on 06/23/2013, 70.8 on 08/25/2013, 56.3 on 10/08/2013, 102.7 on 12/15/2013, 149.4 on 01/07/2014, 178.0 on 02/04/2014, 190.7 on 03/30/2014, 143.2 on 04/22/2014, 134.8 on 05/20/2014, 248.3 on 07/09/2014, 723.2 on 08/25/2014, 252.3 on 10/08/2014, 126 on 10/29/2014, 71.3 on 11/24/2014, 55 on 12/15/2014, 70.6 on 01/05/2015, 48.6 on 01/26/2015, 52.7 on 02/15/2015, 54.7 on 03/30/2015, 54 on 04/20/2015, 55.2 on 06/01/2015, 48.8 on 06/22/2015, 49.7 08/03/2015, 44.6 on 08/24/2015, 44.5 on 09/16/2015, 44.8 on 10/07/2015, 52.1 on 10/28/2015, 47.0 on 11/18/2015, 49.9 on 12/09/2015, 46.7 on 12/30/2015, 53.3 on 02/03/2016, 49.3 on 02/24/2016, 56.3 on 03/23/2016, 57.3 on 04/27/2016, and 65.7 on 05/04/2016.  She received Ibrance and Faslodex from 02/23/2014 until 07/27/2014.    Echo on 09/04/2014 revealed an EF of 55-65%.  Echo on 12/14/2014, 03/15/2015, 06/15/2015 revealed an EF of 55-60%, 50% on 09/23/2015, 60-65% on 11/16/2015, 60-65% on 02/16/2016, and 60-65% on 05/19/2016.   PET scan on  08/28/2014 revealed significant interval worsening and multifocal osseous metastatic disease. Lesions had increased in number and metabolic activity. There were no extra osseous metastasis.  She is s/p 70  cycles of Kadcyla (09/10/2014 - 05/04/2016).  She is tolerating treatment well.  She denies any bone pain.   PET scan on 02/08/2015 revealed a complete metabolic response in the sclerotic osseous metastases, with no residual hypermetabolic metastatic disease.  PET scan on 08/23/2015 revealed no new or progressive hypermetabolic metastatic disease.   Bone scan on 07/30/2015 revealed multifocal osseous metastases (sternum, thoracolumbar spine, multiple ribs, and right iliac crest).  The dominant left acetabular metastasis noted on prior PET was not evident. Some of the other sites of disease appear to have progressed.  There was no prior bone scan for comparison.  PET scan on 08/23/2015 revealed no new or progressive hypermetabolic disease.  There was stable mildly hypermetabolic sclerotic metastasis in the right proximal femoral metaphysis.  There was stable nonspecific mild patchy hypermetabolism in the upper and lower spine correlating with mixed lytic and sclerotic osseous metastases in the CT images  Bone scan on 02/16/2016 revealed multiple osseous metastasis with distribution of metastasis similar to that seen on the previous exam.  PET scan on 05/19/2016 revealed similar appearance of osseous metastasis with mild decrease in the index lesion hypermetabolism.  There was no evidence of soft tissue metastasis.  There was the development of innumerable, partially cavitary upper lobe predominant pulmonary nodules.   Bronchoscopy on 05/31/2016 revealed no evidence of tumor or infection.  She was admitted from 12/22/2014 - 12/24/2014 with E coli sepsis secondary to a UTI.  Serum protein was elevated.  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  She has intermittent electrolytes issues  (hypokalemia, hypomagnesemia, and hypocalcemia).  She is on supplementation.  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 Total bilirubin is 1.3 today.  Symptomatically, she feels good.  Exam is normal.  Potassium, magnesium, and calcium are normal on supplementation.   Plan: 1.  Labs today:  CBC with diff, CMP, direct bilirubin, Mg, CA27.29. 2.  Review PET scan.  No evidence of progression.  Etiology of cavitary pulmonary lesions unclear.  Suspect infectious etiology.  Doubt reaction to [place].  Interstitial lung disease (ILD), including pneumonitis has been reported with Kadcyla. Signs and symptoms of pneumonitis include dyspnea, cough, fatigue, and pulmonary infiltrates. Patients who have dyspnea at rest (due to advance malignancy complications or comorbidity) may be at increased risk for pulmonary toxicity.  She denies any respiratory symptoms.  Suspect etiology is infectious. 3.  Discuss patient's thoughts about therapy.  She would like to continue her chemotherapy holiday.  She will be monitored monthy.  She was encouraged to contact the clinic if any concerns. 4.  RN to call patient with tumor marker results.  5.  Port flushes every 4-6 weeks.  6. RTC monthly for MD assessment and labs (CBC with diff, CMP, CA27.29).   Lequita Asal, MD  06/23/2016, 2:58 PM

## 2016-06-23 NOTE — Progress Notes (Signed)
Patient here today for follow up regarding breast cancer.  Offers no complaints today.  Patient requesting refills for Phospha and K+.  Has left message for Barnabas Lister, Education officer, museum to get help from [employer] to cover cost.

## 2016-06-24 LAB — CANCER ANTIGEN 27.29: CA 27.29: 101.1 U/mL — ABNORMAL HIGH (ref 0.0–38.6)

## 2016-06-26 ENCOUNTER — Telehealth: Payer: Self-pay | Admitting: *Deleted

## 2016-06-26 ENCOUNTER — Other Ambulatory Visit: Payer: Self-pay | Admitting: *Deleted

## 2016-06-26 DIAGNOSIS — C50911 Malignant neoplasm of unspecified site of right female breast: Secondary | ICD-10-CM

## 2016-06-26 DIAGNOSIS — Z170 Estrogen receptor positive status [ER+]: Secondary | ICD-10-CM

## 2016-06-26 DIAGNOSIS — C7951 Secondary malignant neoplasm of bone: Secondary | ICD-10-CM

## 2016-06-26 NOTE — Telephone Encounter (Signed)
-----   Message from Lequita Asal, MD sent at 06/24/2016 10:14 AM EDT ----- Regarding: Please call patient  Tumor marker increasing. 74 to 101.  When would she like to start [place]?  M  ----- Message ----- From: Interface, Lab In [place] Sent: 06/23/2016   1:45 PM To: Lequita Asal, MD

## 2016-06-26 NOTE — Telephone Encounter (Signed)
Called patient to inform her that tumor marker has increased from 74-101.  Per MD, asked patient when she would like to start [place]?  Per patient, she is in agreement with whatever the MD recommends.

## 2016-06-26 NOTE — Telephone Encounter (Signed)
I will get her scheduled.  

## 2016-06-27 ENCOUNTER — Ambulatory Visit
Admission: RE | Admit: 2016-06-27 | Discharge: 2016-06-27 | Disposition: A | Discharge status: Home / Self Care | Payer: Medicare Other | Source: Ambulatory Visit | Attending: Ophthalmology | Admitting: Ophthalmology

## 2016-06-27 ENCOUNTER — Encounter: Payer: Self-pay | Admitting: *Deleted

## 2016-06-27 ENCOUNTER — Ambulatory Visit: Payer: Medicare Other | Admitting: Anesthesiology

## 2016-06-27 ENCOUNTER — Encounter
Admission: RE | Disposition: A | Discharge status: Home / Self Care | Payer: Self-pay | Source: Ambulatory Visit | Attending: Ophthalmology

## 2016-06-27 DIAGNOSIS — F329 Major depressive disorder, single episode, unspecified: Secondary | ICD-10-CM | POA: Insufficient documentation

## 2016-06-27 DIAGNOSIS — Z79899 Other long term (current) drug therapy: Secondary | ICD-10-CM | POA: Insufficient documentation

## 2016-06-27 DIAGNOSIS — F17200 Nicotine dependence, unspecified, uncomplicated: Secondary | ICD-10-CM | POA: Insufficient documentation

## 2016-06-27 DIAGNOSIS — J449 Chronic obstructive pulmonary disease, unspecified: Secondary | ICD-10-CM | POA: Insufficient documentation

## 2016-06-27 DIAGNOSIS — K219 Gastro-esophageal reflux disease without esophagitis: Secondary | ICD-10-CM | POA: Diagnosis not present

## 2016-06-27 DIAGNOSIS — H2512 Age-related nuclear cataract, left eye: Secondary | ICD-10-CM | POA: Insufficient documentation

## 2016-06-27 HISTORY — PX: CATARACT EXTRACTION W/PHACO: SHX586

## 2016-06-27 SURGERY — PHACOEMULSIFICATION, CATARACT, WITH IOL INSERTION
Anesthesia: Monitor Anesthesia Care | Site: Eye | Laterality: Left | Wound class: Clean

## 2016-06-27 MED ORDER — MIDAZOLAM HCL 2 MG/2ML IJ SOLN
INTRAMUSCULAR | Status: AC
  Filled 2016-06-27: qty 2

## 2016-06-27 MED ORDER — NA CHONDROIT SULF-NA HYALURON 40-17 MG/ML IO SOLN
INTRAOCULAR | Status: DC | PRN
  Administered 2016-06-27: 1 mL via INTRAOCULAR

## 2016-06-27 MED ORDER — POVIDONE-IODINE 5 % OP SOLN
OPHTHALMIC | Status: AC
  Filled 2016-06-27: qty 30

## 2016-06-27 MED ORDER — LIDOCAINE HCL (PF) 4 % IJ SOLN
INTRAMUSCULAR | Status: AC
  Filled 2016-06-27: qty 5

## 2016-06-27 MED ORDER — POVIDONE-IODINE 5 % OP SOLN
OPHTHALMIC | Status: DC | PRN
  Administered 2016-06-27: 1 via OPHTHALMIC

## 2016-06-27 MED ORDER — ARMC OPHTHALMIC DILATING DROPS
1.0000 "application " | OPHTHALMIC | Status: AC
  Administered 2016-06-27 (×3): 1 via OPHTHALMIC

## 2016-06-27 MED ORDER — MOXIFLOXACIN HCL 0.5 % OP SOLN: 1.0000 [drp] | OPHTHALMIC | Status: DC | PRN

## 2016-06-27 MED ORDER — LIDOCAINE HCL (PF) 4 % IJ SOLN
INTRAOCULAR | Status: DC | PRN
  Administered 2016-06-27: 2 mL via OPHTHALMIC

## 2016-06-27 MED ORDER — SODIUM CHLORIDE 0.9 % IV SOLN
INTRAVENOUS | Status: DC
  Administered 2016-06-27 (×2): via INTRAVENOUS

## 2016-06-27 MED ORDER — EPINEPHRINE PF 1 MG/ML IJ SOLN
INTRAMUSCULAR | Status: DC | PRN
  Administered 2016-06-27: 1 mL via OPHTHALMIC

## 2016-06-27 MED ORDER — EPINEPHRINE PF 1 MG/ML IJ SOLN
INTRAMUSCULAR | Status: AC
  Filled 2016-06-27: qty 2

## 2016-06-27 MED ORDER — MOXIFLOXACIN HCL 0.5 % OP SOLN
OPHTHALMIC | Status: AC
  Filled 2016-06-27: qty 3

## 2016-06-27 MED ORDER — ARMC OPHTHALMIC DILATING DROPS
OPHTHALMIC | Status: AC
  Filled 2016-06-27: qty 0.4

## 2016-06-27 MED ORDER — MIDAZOLAM HCL 2 MG/2ML IJ SOLN
INTRAMUSCULAR | Status: DC | PRN
  Administered 2016-06-27: .5 mg via INTRAVENOUS
  Administered 2016-06-27: 1.5 mg via INTRAVENOUS

## 2016-06-27 MED ORDER — MOXIFLOXACIN HCL 0.5 % OP SOLN
OPHTHALMIC | Status: DC | PRN
  Administered 2016-06-27: .2 mL via OPHTHALMIC

## 2016-06-27 MED ORDER — CARBACHOL 0.01 % IO SOLN
INTRAOCULAR | Status: DC | PRN
  Administered 2016-06-27: .5 mL via INTRAOCULAR

## 2016-06-27 MED ORDER — NA CHONDROIT SULF-NA HYALURON 40-17 MG/ML IO SOLN
INTRAOCULAR | Status: AC
  Filled 2016-06-27: qty 1

## 2016-06-27 SURGICAL SUPPLY — 15 items
CARTRIDGE ABBOTT (MISCELLANEOUS) ×3 IMPLANT
GLOVE BIO SURGEON STRL SZ8 (GLOVE) ×3 IMPLANT
GLOVE BIOGEL M 6.5 STRL (GLOVE) ×3 IMPLANT
GLOVE SURG LX 8.0 MICRO (GLOVE) ×2
GLOVE SURG LX STRL 8.0 MICRO (GLOVE) ×1 IMPLANT
GOWN STRL REUS W/ TWL LRG LVL3 (GOWN DISPOSABLE) ×2 IMPLANT
GOWN STRL REUS W/TWL LRG LVL3 (GOWN DISPOSABLE) ×4
LENS IOL TECNIS ITEC 24.5 (Intraocular Lens) ×3 IMPLANT
PACK CATARACT (MISCELLANEOUS) ×3 IMPLANT
PACK CATARACT BRASINGTON LX (MISCELLANEOUS) ×3 IMPLANT
SOL BSS BAG (MISCELLANEOUS) ×3
SOLUTION BSS BAG (MISCELLANEOUS) ×1 IMPLANT
SYR 5ML LL (SYRINGE) ×3 IMPLANT
WATER STERILE IRR 250ML POUR (IV SOLUTION) ×3 IMPLANT
WIPE NON LINTING 3.25X3.25 (MISCELLANEOUS) ×3 IMPLANT

## 2016-06-27 NOTE — H&P (Signed)
All labs reviewed. Abnormal studies sent to patients PCP when indicated.  Previous H&P reviewed, patient examined, there are NO CHANGES.  [name]6/26/201810:36 AM

## 2016-06-27 NOTE — Op Note (Signed)
PREOPERATIVE DIAGNOSIS:  Nuclear sclerotic cataract of the left eye.   POSTOPERATIVE DIAGNOSIS:  Nuclear sclerotic cataract of the left eye.   OPERATIVE PROCEDURE: Procedure(s): CATARACT EXTRACTION PHACO AND INTRAOCULAR LENS PLACEMENT (IOC)   SURGEON:  Birder Robson, MD.   ANESTHESIA:  Anesthesiologist: [facility], Jane Canary, MD CRNA: Nelda Marseille, CRNA  1.      Managed anesthesia care. 2.     0.14ml of Shugarcaine was instilled following the paracentesis   COMPLICATIONS:  None.   TECHNIQUE:   Stop and chop   DESCRIPTION OF PROCEDURE:  The patient was examined and consented in the preoperative holding area where the aforementioned topical anesthesia was applied to the left eye and then brought back to the Operating Room where the left eye was prepped and draped in the usual sterile ophthalmic fashion and a lid speculum was placed. A paracentesis was created with the side port blade and the anterior chamber was filled with viscoelastic. A near clear corneal incision was performed with the steel keratome. A continuous curvilinear capsulorrhexis was performed with a cystotome followed by the capsulorrhexis forceps. Hydrodissection and hydrodelineation were carried out with BSS on a blunt cannula. The lens was removed in a stop and chop  technique and the remaining cortical material was removed with the irrigation-aspiration handpiece. The capsular bag was inflated with viscoelastic and the Technis [id number] lens was placed in the capsular bag without complication. The remaining viscoelastic was removed from the eye with the irrigation-aspiration handpiece. The wounds were hydrated. The anterior chamber was flushed with Miostat and the eye was inflated to physiologic pressure. 0.30ml Vigamox was placed in the anterior chamber. The wounds were found to be water tight. The eye was dressed with Vigamox. The patient was given protective glasses to wear throughout the day and a shield with which to  sleep tonight. The patient was also given drops with which to begin a drop regimen today and will follow-up with me in one day.  Implant Name Type Inv. Item Serial No. Manufacturer Lot No. LRB No. Used  LENS IOL DIOP 24.5 - [id number] Intraocular Lens LENS IOL DIOP 24.5 [id number] AMO   Left 1    Procedure(s) with comments: CATARACT EXTRACTION PHACO AND INTRAOCULAR LENS PLACEMENT (IOC) (Left) - [place] 00:37 AP% 19.1 CDE  7.11 Fluid pack lot # [id number]  Electronically signed: [place] 06/27/2016 11:06 AM

## 2016-06-27 NOTE — Transfer of Care (Signed)
Immediate Anesthesia Transfer of Care Note  Patient: Elizabeth Bond  Procedure(s) Performed: Procedure(s) with comments: CATARACT EXTRACTION PHACO AND INTRAOCULAR LENS PLACEMENT (IOC) (Left) - [place] 00:37 AP% 19.1 CDE  7.11 Fluid pack lot # [id number]  Patient Location: PACU  Anesthesia Type:MAC  Level of Consciousness: awake  Airway & Oxygen Therapy: Patient Spontanous Breathing  Post-op Assessment: Report given to RN  Post vital signs: Reviewed and stable  Last Vitals:  Vitals:   06/27/16 0906  BP: (!) 154/77  Pulse: 74  Resp: 14  Temp: 36.8 C    Last Pain:  Vitals:   06/27/16 0906  TempSrc: Oral  PainSc: 0-No pain         Complications: No apparent anesthesia complications

## 2016-06-27 NOTE — Anesthesia Postprocedure Evaluation (Signed)
Anesthesia Post Note  Patient: Elizabeth Bond  Procedure(s) Performed: Procedure(s) (LRB): CATARACT EXTRACTION PHACO AND INTRAOCULAR LENS PLACEMENT (IOC) (Left)  Patient location during evaluation: PACU Anesthesia Type: MAC Level of consciousness: awake Pain management: pain level controlled Vital Signs Assessment: post-procedure vital signs reviewed and stable Respiratory status: spontaneous breathing Cardiovascular status: stable Anesthetic complications: no     Last Vitals:  Vitals:   06/27/16 1107 06/27/16 1119  BP: 140/85 (!) 141/74  Pulse: 76 70  Resp: 15 16  Temp:      Last Pain:  Vitals:   06/27/16 0906  TempSrc: Oral  PainSc: 0-No pain                 VAN STAVEREN,[place] [employer]ent

## 2016-06-27 NOTE — Anesthesia Preprocedure Evaluation (Signed)
Anesthesia Evaluation  Patient identified by MRN, date of birth, ID band Patient awake    Reviewed: Allergy & Precautions, NPO status , Patient's Chart, lab work & pertinent test results  Airway Mallampati: III       Dental  (+) Teeth Intact   Pulmonary COPD, Current Smoker,     + decreased breath sounds      Cardiovascular Exercise Tolerance: Good negative cardio ROS   Rhythm:Regular     Neuro/Psych Depression    GI/Hepatic Neg liver ROS, GERD  Medicated,  Endo/Other  negative endocrine ROS  Renal/GU negative Renal ROS     Musculoskeletal   Abdominal Normal abdominal exam  (+)   Peds negative pediatric ROS (+)  Hematology negative hematology ROS (+)   Anesthesia Other Findings   Reproductive/Obstetrics                             Anesthesia Physical Anesthesia Plan  ASA: III  Anesthesia Plan: MAC   Post-op Pain Management:    Induction: Intravenous  PONV Risk Score and Plan: 0  Airway Management Planned: Natural Airway and Nasal Cannula  Additional Equipment:   Intra-op Plan:   Post-operative Plan:   Informed Consent: I have reviewed the patients History and Physical, chart, labs and discussed the procedure including the risks, benefits and alternatives for the proposed anesthesia with the patient or authorized representative who has indicated his/her understanding and acceptance.     Plan Discussed with: CRNA  Anesthesia Plan Comments:         Anesthesia Quick Evaluation

## 2016-06-27 NOTE — Anesthesia Post-op Follow-up Note (Cosign Needed)
Anesthesia QCDR form completed.        

## 2016-06-27 NOTE — Discharge Instructions (Signed)
Eye Surgery Discharge Instructions  Expect mild scratchy sensation or mild soreness. DO NOT RUB YOUR EYE!  The day of surgery:  Minimal physical activity, but bed rest is not required  No reading, computer work, or close hand work  No bending, lifting, or straining.  May watch TV  For 24 hours:  No driving, legal decisions, or alcoholic beverages  Safety precautions  Eat anything you prefer: It is better to start with liquids, then soup then solid foods.  _____ Eye patch should be worn until postoperative exam tomorrow.  ____ Solar shield eyeglasses should be worn for comfort in the sunlight/patch while sleeping  Resume all regular medications including aspirin or Coumadin if these were discontinued prior to surgery. You may shower, bathe, shave, or wash your hair. Tylenol may be taken for mild discomfort.  Call your doctor if you experience significant pain, nausea, or vomiting, fever > 101 or other signs of infection. [phone] or [phone] Specific instructions:  Follow-up Information    Birder Robson, MD Follow up.   Specialty:  Ophthalmology Why:  June 27 at 10:55am Contact information: [address] [place] [address] [phone]

## 2016-06-28 ENCOUNTER — Observation Stay
Admission: EM | Admit: 2016-06-28 | Discharge: 2016-06-29 | Disposition: A | Discharge status: Home / Self Care | Payer: Medicare Other | Attending: Internal Medicine | Admitting: Internal Medicine

## 2016-06-28 ENCOUNTER — Inpatient Hospital Stay: Payer: Medicare Other

## 2016-06-28 ENCOUNTER — Other Ambulatory Visit: Payer: Self-pay

## 2016-06-28 ENCOUNTER — Emergency Department: Payer: Medicare Other

## 2016-06-28 DIAGNOSIS — F17210 Nicotine dependence, cigarettes, uncomplicated: Secondary | ICD-10-CM | POA: Insufficient documentation

## 2016-06-28 DIAGNOSIS — R918 Other nonspecific abnormal finding of lung field: Secondary | ICD-10-CM

## 2016-06-28 DIAGNOSIS — K219 Gastro-esophageal reflux disease without esophagitis: Secondary | ICD-10-CM | POA: Diagnosis not present

## 2016-06-28 DIAGNOSIS — R4182 Altered mental status, unspecified: Secondary | ICD-10-CM | POA: Diagnosis not present

## 2016-06-28 DIAGNOSIS — G936 Cerebral edema: Secondary | ICD-10-CM | POA: Diagnosis not present

## 2016-06-28 DIAGNOSIS — C50911 Malignant neoplasm of unspecified site of right female breast: Secondary | ICD-10-CM | POA: Insufficient documentation

## 2016-06-28 DIAGNOSIS — I639 Cerebral infarction, unspecified: Secondary | ICD-10-CM

## 2016-06-28 DIAGNOSIS — Z801 Family history of malignant neoplasm of trachea, bronchus and lung: Secondary | ICD-10-CM | POA: Diagnosis not present

## 2016-06-28 DIAGNOSIS — R569 Unspecified convulsions: Secondary | ICD-10-CM | POA: Diagnosis not present

## 2016-06-28 DIAGNOSIS — C7931 Secondary malignant neoplasm of brain: Secondary | ICD-10-CM

## 2016-06-28 DIAGNOSIS — C7951 Secondary malignant neoplasm of bone: Secondary | ICD-10-CM | POA: Diagnosis not present

## 2016-06-28 DIAGNOSIS — Z170 Estrogen receptor positive status [ER+]: Secondary | ICD-10-CM | POA: Diagnosis not present

## 2016-06-28 DIAGNOSIS — Z9841 Cataract extraction status, right eye: Secondary | ICD-10-CM | POA: Insufficient documentation

## 2016-06-28 DIAGNOSIS — K2960 Other gastritis without bleeding: Secondary | ICD-10-CM

## 2016-06-28 DIAGNOSIS — D496 Neoplasm of unspecified behavior of brain: Secondary | ICD-10-CM

## 2016-06-28 DIAGNOSIS — Z886 Allergy status to analgesic agent status: Secondary | ICD-10-CM | POA: Insufficient documentation

## 2016-06-28 DIAGNOSIS — R9431 Abnormal electrocardiogram [ECG] [EKG]: Secondary | ICD-10-CM | POA: Diagnosis not present

## 2016-06-28 DIAGNOSIS — F329 Major depressive disorder, single episode, unspecified: Secondary | ICD-10-CM | POA: Diagnosis not present

## 2016-06-28 DIAGNOSIS — Z961 Presence of intraocular lens: Secondary | ICD-10-CM | POA: Insufficient documentation

## 2016-06-28 DIAGNOSIS — M1990 Unspecified osteoarthritis, unspecified site: Secondary | ICD-10-CM | POA: Diagnosis not present

## 2016-06-28 DIAGNOSIS — G9341 Metabolic encephalopathy: Secondary | ICD-10-CM | POA: Insufficient documentation

## 2016-06-28 DIAGNOSIS — Z90710 Acquired absence of both cervix and uterus: Secondary | ICD-10-CM | POA: Diagnosis not present

## 2016-06-28 DIAGNOSIS — J449 Chronic obstructive pulmonary disease, unspecified: Secondary | ICD-10-CM | POA: Insufficient documentation

## 2016-06-28 DIAGNOSIS — Z808 Family history of malignant neoplasm of other organs or systems: Secondary | ICD-10-CM | POA: Insufficient documentation

## 2016-06-28 DIAGNOSIS — Z96652 Presence of left artificial knee joint: Secondary | ICD-10-CM | POA: Diagnosis not present

## 2016-06-28 DIAGNOSIS — Z800 Family history of malignant neoplasm of digestive organs: Secondary | ICD-10-CM | POA: Insufficient documentation

## 2016-06-28 DIAGNOSIS — Z803 Family history of malignant neoplasm of breast: Secondary | ICD-10-CM | POA: Diagnosis not present

## 2016-06-28 DIAGNOSIS — Z8042 Family history of malignant neoplasm of prostate: Secondary | ICD-10-CM | POA: Diagnosis not present

## 2016-06-28 DIAGNOSIS — Z79899 Other long term (current) drug therapy: Secondary | ICD-10-CM | POA: Insufficient documentation

## 2016-06-28 DIAGNOSIS — Z716 Tobacco abuse counseling: Secondary | ICD-10-CM | POA: Diagnosis not present

## 2016-06-28 DIAGNOSIS — Z9842 Cataract extraction status, left eye: Secondary | ICD-10-CM | POA: Insufficient documentation

## 2016-06-28 DIAGNOSIS — R404 Transient alteration of awareness: Secondary | ICD-10-CM | POA: Diagnosis not present

## 2016-06-28 DIAGNOSIS — K589 Irritable bowel syndrome without diarrhea: Secondary | ICD-10-CM | POA: Insufficient documentation

## 2016-06-28 DIAGNOSIS — C50919 Malignant neoplasm of unspecified site of unspecified female breast: Secondary | ICD-10-CM | POA: Diagnosis not present

## 2016-06-28 DIAGNOSIS — C719 Malignant neoplasm of brain, unspecified: Secondary | ICD-10-CM | POA: Diagnosis not present

## 2016-06-28 DIAGNOSIS — R29818 Other symptoms and signs involving the nervous system: Secondary | ICD-10-CM | POA: Diagnosis not present

## 2016-06-28 LAB — PROTIME-INR
INR: 1
Prothrombin Time: 13.2 seconds (ref 11.4–15.2)

## 2016-06-28 LAB — COMPREHENSIVE METABOLIC PANEL
ALT: 26 U/L (ref 14–54)
AST: 48 U/L — AB (ref 15–41)
Albumin: 3.5 g/dL (ref 3.5–5.0)
Alkaline Phosphatase: 73 U/L (ref 38–126)
Anion gap: 6 (ref 5–15)
BILIRUBIN TOTAL: 1.3 mg/dL — AB (ref 0.3–1.2)
BUN: 18 mg/dL (ref 6–20)
CHLORIDE: 102 mmol/L (ref 101–111)
CO2: 28 mmol/L (ref 22–32)
Calcium: 9.4 mg/dL (ref 8.9–10.3)
Creatinine, Ser: 0.9 mg/dL (ref 0.44–1.00)
GFR calc Af Amer: >60 mL/min (ref >60)
GFR calc non Af Amer: >60 mL/min (ref >60)
GLUCOSE: 99 mg/dL (ref 65–99)
POTASSIUM: 3.3 mmol/L — AB (ref 3.5–5.1)
Sodium: 136 mmol/L (ref 135–145)
Total Protein: 8 g/dL (ref 6.5–8.1)

## 2016-06-28 LAB — CBC
HEMATOCRIT: 39.1 % (ref 35.0–47.0)
HEMOGLOBIN: 13.8 g/dL (ref 12.0–16.0)
MCH: 34.4 pg — ABNORMAL HIGH (ref 26.0–34.0)
MCHC: 35.2 g/dL (ref 32.0–36.0)
MCV: 97.6 fL (ref 80.0–100.0)
Platelets: 164 10*3/uL (ref 150–440)
RBC: 4.01 MIL/uL (ref 3.80–5.20)
RDW: 16.2 % — ABNORMAL HIGH (ref 11.5–14.5)
WBC: 5.1 10*3/uL (ref 3.6–11.0)

## 2016-06-28 LAB — DIFFERENTIAL
BASOS ABS: 0 10*3/uL (ref 0–0.1)
BASOS PCT: 1 %
EOS ABS: 0.1 10*3/uL (ref 0–0.7)
Eosinophils Relative: 1 %
LYMPHS ABS: 1 10*3/uL (ref 1.0–3.6)
Lymphocytes Relative: 19 %
MONOS PCT: 9 %
Monocytes Absolute: 0.5 10*3/uL (ref 0.2–0.9)
NEUTROS ABS: 3.6 10*3/uL (ref 1.4–6.5)
NEUTROS PCT: 70 %

## 2016-06-28 LAB — LIPID PANEL
CHOLESTEROL: 201 mg/dL — AB (ref 0–200)
HDL: 73 mg/dL (ref >40)
LDL Cholesterol: 109 mg/dL — ABNORMAL HIGH (ref 0–99)
TRIGLYCERIDES: 95 mg/dL (ref <150)
Total CHOL/HDL Ratio: 2.8 RATIO
VLDL: 19 mg/dL (ref 0–40)

## 2016-06-28 LAB — TROPONIN I: Troponin I: <0.03 ng/mL (ref <0.03)

## 2016-06-28 LAB — GLUCOSE, CAPILLARY: Glucose-Capillary: 100 mg/dL — ABNORMAL HIGH (ref 65–99)

## 2016-06-28 LAB — APTT: APTT: 37 s — AB (ref 24–36)

## 2016-06-28 MED ORDER — DEXAMETHASONE SODIUM PHOSPHATE 10 MG/ML IJ SOLN
10.0000 mg | Freq: Once | INTRAMUSCULAR | Status: AC
  Administered 2016-06-28: 10 mg via INTRAVENOUS
  Filled 2016-06-28: qty 1

## 2016-06-28 MED ORDER — PANTOPRAZOLE SODIUM 40 MG PO TBEC
40.0000 mg | DELAYED_RELEASE_TABLET | Freq: Every day | ORAL | Status: DC
Start: 2016-06-28 — End: 2016-06-29
  Administered 2016-06-28 – 2016-06-29 (×2): 40 mg via ORAL
  Filled 2016-06-28 (×2): qty 1

## 2016-06-28 MED ORDER — MAGNESIUM OXIDE 400 (241.3 MG) MG PO TABS
400.0000 mg | ORAL_TABLET | Freq: Every day | ORAL | Status: DC
  Administered 2016-06-29: 400 mg via ORAL
  Filled 2016-06-28: qty 1

## 2016-06-28 MED ORDER — FAMOTIDINE IN NACL 20-0.9 MG/50ML-% IV SOLN
20.0000 mg | INTRAVENOUS | Status: DC
  Administered 2016-06-28: 20 mg via INTRAVENOUS
  Filled 2016-06-28 (×2): qty 50

## 2016-06-28 MED ORDER — POTASSIUM CHLORIDE CRYS ER 10 MEQ PO TBCR
10.0000 meq | EXTENDED_RELEASE_TABLET | Freq: Every day | ORAL | Status: DC
  Administered 2016-06-28 – 2016-06-29 (×2): 10 meq via ORAL
  Filled 2016-06-28 (×2): qty 1

## 2016-06-28 MED ORDER — LEVETIRACETAM 500 MG PO TABS
500.0000 mg | ORAL_TABLET | Freq: Two times a day (BID) | ORAL | Status: DC
  Administered 2016-06-28 – 2016-06-29 (×2): 500 mg via ORAL
  Filled 2016-06-28 (×3): qty 1

## 2016-06-28 MED ORDER — TEMAZEPAM 15 MG PO CAPS: 15.0000 mg | ORAL_CAPSULE | Freq: Every evening | ORAL | Status: DC | PRN

## 2016-06-28 MED ORDER — SODIUM CHLORIDE 0.9 % IV SOLN
1500.0000 mg | Freq: Once | INTRAVENOUS | Status: AC
  Administered 2016-06-28: 1500 mg via INTRAVENOUS
  Filled 2016-06-28: qty 15

## 2016-06-28 MED ORDER — DOCUSATE SODIUM 100 MG PO CAPS: 100.0000 mg | ORAL_CAPSULE | Freq: Two times a day (BID) | ORAL | Status: DC | PRN

## 2016-06-28 MED ORDER — MAGNESIUM 500 MG PO TABS: 500.0000 mg | ORAL_TABLET | Freq: Every day | ORAL | Status: DC

## 2016-06-28 MED ORDER — K PHOS MONO-SOD PHOS DI & MONO 155-852-130 MG PO TABS
250.0000 mg | ORAL_TABLET | Freq: Two times a day (BID) | ORAL | Status: DC
Start: 2016-06-28 — End: 2016-06-29
  Administered 2016-06-28 – 2016-06-29 (×2): 250 mg via ORAL
  Filled 2016-06-28 (×3): qty 1

## 2016-06-28 MED ORDER — LORATADINE 10 MG PO TABS: 10.0000 mg | ORAL_TABLET | Freq: Every day | ORAL | Status: DC | PRN

## 2016-06-28 MED ORDER — HEPARIN SODIUM (PORCINE) 5000 UNIT/ML IJ SOLN
5000.0000 [IU] | Freq: Three times a day (TID) | INTRAMUSCULAR | Status: DC
  Administered 2016-06-28 – 2016-06-29 (×2): 5000 [IU] via SUBCUTANEOUS
  Filled 2016-06-28: qty 1

## 2016-06-28 MED ORDER — DEXAMETHASONE SODIUM PHOSPHATE 10 MG/ML IJ SOLN: 10.0000 mg | INTRAMUSCULAR | Status: DC

## 2016-06-28 MED ORDER — DEXAMETHASONE SODIUM PHOSPHATE 4 MG/ML IJ SOLN
4.0000 mg | Freq: Four times a day (QID) | INTRAMUSCULAR | Status: DC
  Administered 2016-06-28 – 2016-06-29 (×3): 4 mg via INTRAVENOUS
  Filled 2016-06-28 (×3): qty 1

## 2016-06-28 MED ORDER — GADOBENATE DIMEGLUMINE 529 MG/ML IV SOLN
10.0000 mL | Freq: Once | INTRAVENOUS | Status: AC | PRN
  Administered 2016-06-28: 10 mL via INTRAVENOUS

## 2016-06-28 MED ORDER — CALCIUM-VITAMIN D 500-200 MG-UNIT PO TABS
1.0000 | ORAL_TABLET | Freq: Two times a day (BID) | ORAL | Status: DC
  Administered 2016-06-28 – 2016-06-29 (×2): 1 via ORAL
  Filled 2016-06-28 (×3): qty 1

## 2016-06-28 NOTE — ED Notes (Signed)
Patient transported to MRI 

## 2016-06-28 NOTE — Progress Notes (Signed)
Family Meeting Note  Advance Directive:yes  Today a meeting took place with the Patient and her daughter and grand daughter..  The following clinical team members were present during this meeting:MD  The following were discussed:Patient's diagnosis: metastatic cancer- mets to brain. , Patient's progosis: Unable to determine and Goals for treatment: DNR  Patient's daughter and her granddaughter and other family members are present in the room during my visit, I explained him about the seriousness of this situation and if the edema worsens she may have respiratory distress was sometimes even cardiac arrest. Patient understood that and she would like to be DO NOT RESUSCITATE in any adverse event.   Additional follow-up to be provided: neurology.  Time spent during discussion:20 minutes  Trinh Sanjose, Rosalio Macadamia, MD

## 2016-06-28 NOTE — ED Triage Notes (Signed)
Pt arrived via ems for c/o "seizure" and posturing to the right - pt also noted to be drooling excessively and unable to speak - does move eyes to verbal commands but not able to follow commands at this time - daughter stated to ems that her mother called her on the phone and was unable to speak and when she arrived at the pt home the pt was drooling and still unable to talk  - this occurred 2 weeks ago and she was told if it happened again to come to the er for eval - pt had cataract surgery yesterday  - pt started posturing in route to hospital and was given Versed 2mg 

## 2016-06-28 NOTE — H&P (Addendum)
[facility] [place] NAME: Elizabeth Bond    MR#:  [id number]  DATE OF BIRTH:  09/06/1950  DATE OF ADMISSION:  06/28/2016  PRIMARY CARE PHYSICIAN: Remi Haggard, FNP   REQUESTING/REFERRING PHYSICIAN: Quentin Cornwall  CHIEF COMPLAINT:   Chief Complaint  Patient presents with  . Code Stroke    HISTORY OF PRESENT ILLNESS:  Elizabeth Bond  is a 66 y.o. female with a known history of Arthritis, breast cancer, COPD, depression, irritable bowel syndrome, metastatic to bone- today at home in the morning was doing fine and daughter took her to her appointment and then left her home after to 3 hours daughter called her back and she did not reply so she went back to see her and she found her mother having drooling from her mouth and confused. Concerned with this he called EMS and they thought she is a stroke but while EMS was there she started having seizures she had total 3 episodes of seizures with EMS and in the emergency room. She was given IV Keppra and Decadron. CT scan of the head showed new findings of metastasis to the brain with some vasogenic surrounding edema. ER physician had telemetry neurology seen the patient and they agreed with the plan of giving Decadron and IV Keppra and then switching to oral and monitoring in hospital. ER physician also spoke to on call neurologist on-call and he suggested he will call 30 oncologist tomorrow to have radiation to her brain. When I saw the patient she is completely alert and oriented she says slight problem with her speech but this is much better than how she was earlier as per her family was present in the room.  PAST MEDICAL HISTORY:   Past Medical History:  Diagnosis Date  . Arthritis   . Breast cancer ([place])    right  . Cancer ([place]) 2015   breast and bone  . COPD (chronic obstructive pulmonary disease) ([place])   . Depression   . GERD (gastroesophageal reflux disease)   . IBS (irritable bowel syndrome)     . Metastasis to bone ([place]) 09/01/2014    PAST SURGICAL HISTORY:   Past Surgical History:  Procedure Laterality Date  . ABDOMINAL HYSTERECTOMY  1978  . CATARACT EXTRACTION W/PHACO Right 06/06/2016   Procedure: CATARACT EXTRACTION PHACO AND INTRAOCULAR LENS PLACEMENT (IOC);  Surgeon: Birder Robson, MD;  Location: [facility] ORS;  Service: Ophthalmology;  Laterality: Right;  [place] 00:51.1 AP% 14.0 CDE 7.16 Fluid pack lot # [id number]  . CATARACT EXTRACTION W/PHACO Left 06/27/2016   Procedure: CATARACT EXTRACTION PHACO AND INTRAOCULAR LENS PLACEMENT (IOC);  Surgeon: Birder Robson, MD;  Location: [facility] ORS;  Service: Ophthalmology;  Laterality: Left;  [place] 00:37 AP% 19.1 CDE  7.11 Fluid pack lot # [id number]  . FLEXIBLE BRONCHOSCOPY N/A 05/31/2016   Procedure: FLEXIBLE BRONCHOSCOPY;  Surgeon: Wilhelmina Mcardle, MD;  Location: [facility] ORS;  Service: Pulmonary;  Laterality: N/A;  . JOINT REPLACEMENT    . PORTACATH PLACEMENT  2015  . REPLACEMENT TOTAL KNEE Left 2004-2005?    SOCIAL HISTORY:   Social History  Substance Use Topics  . Smoking status: Current Every Day Smoker    Packs/day: 0.25    Years: 40.00    Types: Cigarettes  . Smokeless tobacco: Never Used  . Alcohol use No    FAMILY HISTORY:   Family History  Problem Relation Age of Onset  . Cancer Father        prostate  .  Cancer Mother        Pancreatic  . Cancer Maternal Aunt        breast  . Cancer Cousin        breast  . Cancer Other        lung cancer - neice  . Cancer - Other Daughter        brain    DRUG ALLERGIES:   Allergies  Allergen Reactions  . Fentanyl Palpitations and Other (See Comments)    "loopy and confused" "feels like heart is going to come out of my chest"    REVIEW OF SYSTEMS:   ROS  MEDICATIONS AT HOME:   Prior to Admission medications   Medication Sig Start Date End Date Taking? Authorizing Provider  Calcium Carb-Cholecalciferol (CALCIUM-VITAMIN D3) 600-400 MG-UNIT TABS Take 1 tablet by  mouth 2 (two) times daily.   Yes [provider]  Magnesium 500 MG TABS Take 500 mg by mouth daily.    Yes [provider]  pantoprazole (PROTONIX) 40 MG tablet Take 1 tablet (40 mg total) by mouth daily. 03/23/16  Yes Lequita Asal, MD  phosphorus (K PHOS NEUTRAL) 155-852-130 MG tablet Take 1 tablet (250 mg total) by mouth 2 (two) times daily. 06/23/16  Yes Lequita Asal, MD  potassium chloride (K-DUR) 10 MEQ tablet TAKE ONE TABLET BY MOUTH EVERY DAY 06/23/16  Yes Corcoran, Drue Second, MD  acetaminophen (TYLENOL) 325 MG tablet Take 650 mg by mouth daily as needed for headache.    [provider]  loratadine (CLARITIN) 10 MG tablet Take 10 mg by mouth daily as needed for allergies.     [provider]  temazepam (RESTORIL) 15 MG capsule TAKE 1 CAPSULE AT BEDTIME AS NEEDED FOR SLEEP 06/05/16   Lequita Asal, MD      VITAL SIGNS:  Blood pressure (!) 153/72, pulse 91, temperature 97.8 F (36.6 C), resp. rate (!) 23, height _0  (1.676 m), weight 54.4 kg (120 lb), SpO2 100 %.  PHYSICAL EXAMINATION:  Physical Exam  GENERAL:  66 y.o.-year-old patient lying in the bed with no acute distress.  EYES: Pupils equal, round, reactive to light and accommodation. No scleral icterus. Extraocular muscles intact.  HEENT: Head atraumatic, normocephalic. Oropharynx and nasopharynx clear.  NECK:  Supple, no jugular venous distention. No thyroid enlargement, no tenderness.  LUNGS: Normal breath sounds bilaterally, no wheezing, rales,rhonchi or crepitation. No use of accessory muscles of respiration.  CARDIOVASCULAR: S1, S2 normal. No murmurs, rubs, or gallops.  ABDOMEN: Soft, nontender, nondistended. Bowel sounds present. No organomegaly or mass.  EXTREMITIES: No pedal edema, cyanosis, or clubbing.  NEUROLOGIC: Cranial nerves II through XII are intact. Muscle strength 5/5 in all extremities. Sensation intact. Gait not checked. Some slurring of  speech. PSYCHIATRIC: The patient is alert and oriented x 3.  SKIN: No obvious rash, lesion, or ulcer.   LABORATORY PANEL:   CBC  Recent Labs Lab 06/28/16 1552  WBC 5.1  HGB 13.8  HCT 39.1  PLT 164   ------------------------------------------------------------------------------------------------------------------  Chemistries   Recent Labs Lab 06/28/16 1552  NA 136  K 3.3*  CL 102  CO2 28  GLUCOSE 99  BUN 18  CREATININE 0.90  CALCIUM 9.4  AST 48*  ALT 26  ALKPHOS 73  BILITOT 1.3*   ------------------------------------------------------------------------------------------------------------------  Cardiac Enzymes  Recent Labs Lab 06/28/16 1552  TROPONINI <0.03   ------------------------------------------------------------------------------------------------------------------  RADIOLOGY:  Ct Head Code Stroke W/o Cm  Addendum Date: 06/28/2016   ADDENDUM  REPORT: 06/28/2016 16:26 ADDENDUM: Study discussed by telephone with Dr. Merlyn Lot on 06/28/2016 at 1623 hours. Electronically Signed   By: Genevie Ann M.D.   On: 06/28/2016 16:26   Result Date: 06/28/2016 CLINICAL DATA:  Code stroke. 66 year old female found by daughter this evening with slurred speech and drooling. Metastatic breast cancer. EXAM: CT HEAD WITHOUT CONTRAST TECHNIQUE: Contiguous axial images were obtained from the base of the skull through the vertex without intravenous contrast. COMPARISON:  PET-CT 05/19/2016 FINDINGS: Brain: Confluent bilateral white matter hypodensity in a vasogenic edema pattern, new from the visible brain on 05/19/2016. In the anterior right frontal lobe this surrounds are round hyperdense mass centered at the right caudate measuring 3 cm. In the left occipital lobe there is a 10 mm hyperdense mass evident. Additional masses in the left frontal lobe and right anterior temporal lobe are suspected with surrounding edema, but are not well delineated on this noncontrast study.  Intracranial mass effect with slight leftward midline shift. Partially effaced lateral ventricles, but no ventriculomegaly. No acute intracranial hemorrhage identified. No extra-axial collection identified. Basilar cisterns remain patent. No superimposed cortically based acute infarct identified. Vascular: Calcified atherosclerosis at the skull base. No suspicious intracranial vascular hyperdensity. Skull: No acute or suspicious osseous lesion identified in the calvarium or skullbase. Sinuses/Orbits: Visualized paranasal sinuses are stable and well pneumatized. Chronic right mastoid opacification. Left mastoids are clear. Other: No acute orbit or scalp soft tissue finding. ASPECTS [facility] Stroke Program Early CT Score) Total score (0-10 with 10 being normal): Not applicable, multiple brain masses. IMPRESSION: 1. New metastatic disease to the brain since 05/19/2016. Bilateral hemisphere vasogenic edema with partial ventricle effacement and slight leftward midline shift. Follow-up brain MRI without and with contrast - or if MR is contraindicated postcontrast head CT - would allow better staging. 2. No superimposed intracranial hemorrhage or acute cortically based infarct. 3. ASPECTS is Not applicable, multiple brain masses . Electronically Signed: By: Genevie Ann M.D. 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   Slurred speech    IV Decadron and IV Keppra 1 dose given his VRP   We will keep her on oral Keppra from now and continue IV Decadron daily.   MRI on the brain and carotid Doppler study.    Neurologic consult, oncology consult.   May need radiation on the brain metastases.    Patient's daughter and her granddaughter and other family members are present in the room during my visit, I explained him about the seriousness of this situation and if the edema worsens she may have respiratory distress was sometimes even cardiac  arrest. Patient understood that and she would like to be DO NOT RESUSCITATE in any adverse event.  * Metastatic breast cancer   Consult oncology.  * Active smoking   Counseled to quit smoking for 4 minutes and offered nicotin patch.  All the records are reviewed and case discussed with ED provider. Management plans discussed with the patient, family and they are in agreement.  CODE STATUS: DNR  TOTAL TIME TAKING CARE OF THIS PATIENT: 50 minutes.    Vaughan Basta M.D on 06/28/2016 at 6:25 PM  Between 7am to 6pm - Pager - [phone]  After 6pm go to [email or web address] - Proofreader  [facility]  Office  [phone]  CC: Primary care physician; Remi Haggard, FNP   Note: This dictation was  prepared with Dragon dictation along with smaller phrase technology. Any transcriptional errors that result from this process are unintentional.

## 2016-06-28 NOTE — ED Notes (Signed)
Pt is able to speak at this time - words are still not clear and speech slurred but she is articulating what she needs - MD is aware

## 2016-06-28 NOTE — ED Provider Notes (Signed)
[facility] Emergency Department Provider Note    None    (approximate)  I have reviewed the triage vital signs and the nursing notes.   HISTORY  Chief Complaint Code Stroke  Level V Caveat:  Ams, seizure  HPI Elizabeth Bond is a 66 y.o. female with a history of breast cancer on chemotherapy presents with altered mental status and seizure-like activity. EMS witnessed 2 episodes of seizure-like activity that was tonic motion with patient posturing to the right. Patient unresponsive and drooling excessively. Unable to provide any history and unable to speak. They did give 2 mg of Versed in route which broke the posturing and seizure-like activity but the patient then went into a postictal period.   Past Medical History:  Diagnosis Date  . Arthritis   . Breast cancer ([place])    right  . Cancer ([place]) 2015   breast and bone  . COPD (chronic obstructive pulmonary disease) ([place])   . Depression   . GERD (gastroesophageal reflux disease)   . IBS (irritable bowel syndrome)   . Metastasis to bone ([place]) 09/01/2014   Family History  Problem Relation Age of Onset  . Cancer Father        prostate  . Cancer Mother        Pancreatic  . Cancer Maternal Aunt        breast  . Cancer Cousin        breast  . Cancer Other        lung cancer - neice  . Cancer - Other Daughter        brain   Past Surgical History:  Procedure Laterality Date  . ABDOMINAL HYSTERECTOMY  1978  . CATARACT EXTRACTION W/PHACO Right 06/06/2016   Procedure: CATARACT EXTRACTION PHACO AND INTRAOCULAR LENS PLACEMENT (IOC);  Surgeon: Birder Robson, MD;  Location: [facility] ORS;  Service: Ophthalmology;  Laterality: Right;  [place] 00:51.1 AP% 14.0 CDE 7.16 Fluid pack lot # [id number]  . CATARACT EXTRACTION W/PHACO Left 06/27/2016   Procedure: CATARACT EXTRACTION PHACO AND INTRAOCULAR LENS PLACEMENT (IOC);  Surgeon: Birder Robson, MD;  Location: [facility] ORS;  Service: Ophthalmology;  Laterality: Left;   [place] 00:37 AP% 19.1 CDE  7.11 Fluid pack lot # [id number] H  . FLEXIBLE BRONCHOSCOPY N/A 05/31/2016   Procedure: FLEXIBLE BRONCHOSCOPY;  Surgeon: Wilhelmina Mcardle, MD;  Location: [facility] ORS;  Service: Pulmonary;  Laterality: N/A;  . JOINT REPLACEMENT    . PORTACATH PLACEMENT  2015  . REPLACEMENT TOTAL KNEE Left 2004-2005?   Patient Active Problem List   Diagnosis Date Noted  . Seizures ([place]) 06/28/2016  . Pulmonary nodules 05/25/2016  . Hypomagnesemia 02/26/2016  . Hypocalcemia 02/26/2016  . Elevated bilirubin 02/26/2016  . Right shoulder pain 02/24/2016  . Hypokalemia 10/07/2015  . Encounter for antineoplastic chemotherapy 09/16/2015  . Sepsis ([place]) 12/23/2014  . Metastasis to bone ([place]) 09/01/2014  . Breast cancer ([place]) 06/16/2013      Prior to Admission medications   Medication Sig Start Date End Date Taking? Authorizing Provider  Calcium Carb-Cholecalciferol (CALCIUM-VITAMIN D3) 600-400 MG-UNIT TABS Take 1 tablet by mouth 2 (two) times daily.   Yes [provider]  Magnesium 500 MG TABS Take 500 mg by mouth daily.    Yes [provider]  pantoprazole (PROTONIX) 40 MG tablet Take 1 tablet (40 mg total) by mouth daily. 03/23/16  Yes Lequita Asal, MD  phosphorus (K PHOS NEUTRAL) 155-852-130 MG tablet Take 1 tablet (250 mg total) by mouth  2 (two) times daily. 06/23/16  Yes Lequita Asal, MD  potassium chloride (K-DUR) 10 MEQ tablet TAKE ONE TABLET BY MOUTH EVERY DAY 06/23/16  Yes Corcoran, Drue Second, MD  acetaminophen (TYLENOL) 325 MG tablet Take 650 mg by mouth daily as needed for headache.    [provider]  loratadine (CLARITIN) 10 MG tablet Take 10 mg by mouth daily as needed for allergies.     [provider]  temazepam (RESTORIL) 15 MG capsule TAKE 1 CAPSULE AT BEDTIME AS NEEDED FOR SLEEP 06/05/16   Lequita Asal, MD    Allergies Fentanyl    Social History Social History  Substance Use Topics  . Smoking status: Current  Every Day Smoker    Packs/day: 0.25    Years: 40.00    Types: Cigarettes  . Smokeless tobacco: Never Used  . Alcohol use No    Review of Systems Unable to assess 2/2 acuity of condition ____________________________________________   PHYSICAL EXAM:  VITAL SIGNS: Vitals:   06/28/16 1830 06/28/16 1900  BP: (!) 146/77 (!) 148/80  Pulse: 80 73  Resp: 19 17  Temp:      Constitutional: altered, drooling, opens eyes to command, slow to follow commands, drooling Eyes: Conjunctivae are normal.  Head: Atraumatic. Nose: No congestion/rhinnorhea. Mouth/Throat: Mucous membranes are moist.  + laceration to anterior tongue Neck: No stridor. Painless ROM.  Cardiovascular: Normal rate, regular rhythm. Grossly normal heart sounds.  Good peripheral circulation. Respiratory: Normal respiratory effort.  No retractions. Lungs CTAB. Gastrointestinal: Soft and nontender. No distention. No abdominal bruits. No CVA tenderness. Musculoskeletal: No lower extremity tenderness nor edema.  No joint effusions. Neurologic:  Encephalopathic, + aphasia and word finding difficulty, MAE spontaneously Skin:  Skin is warm, dry and intact. No rash noted. Psychiatric: Mood and affect are normal. Speech and behavior are normal.  ____________________________________________   LABS (all labs ordered are listed, but only abnormal results are displayed)  Results for orders placed or performed during the hospital encounter of 06/28/16 (from the past 24 hour(s))  Protime-INR     Status: None   Collection Time: 06/28/16  3:52 PM  Result Value Ref Range   Prothrombin Time 13.2 11.4 - 15.2 seconds   INR 1.00   APTT     Status: Abnormal   Collection Time: 06/28/16  3:52 PM  Result Value Ref Range   aPTT 37 (H) 24 - 36 seconds  CBC     Status: Abnormal   Collection Time: 06/28/16  3:52 PM  Result Value Ref Range   WBC 5.1 3.6 - 11.0 K/uL   RBC 4.01 3.80 - 5.20 MIL/uL   Hemoglobin 13.8 12.0 - 16.0 g/dL   HCT  39.1 35.0 - 47.0 %   MCV 97.6 80.0 - 100.0 fL   MCH 34.4 (H) 26.0 - 34.0 pg   MCHC 35.2 32.0 - 36.0 g/dL   RDW 16.2 (H) 11.5 - 14.5 %   Platelets 164 150 - 440 K/uL  Differential     Status: None   Collection Time: 06/28/16  3:52 PM  Result Value Ref Range   Neutrophils Relative % 70 %   Neutro Abs 3.6 1.4 - 6.5 K/uL   Lymphocytes Relative 19 %   Lymphs Abs 1.0 1.0 - 3.6 K/uL   Monocytes Relative 9 %   Monocytes Absolute 0.5 0.2 - 0.9 K/uL   Eosinophils Relative 1 %   Eosinophils Absolute 0.1 0 - 0.7 K/uL   Basophils Relative 1 %  Basophils Absolute 0.0 0 - 0.1 K/uL  Comprehensive metabolic panel     Status: Abnormal   Collection Time: 06/28/16  3:52 PM  Result Value Ref Range   Sodium 136 135 - 145 mmol/L   Potassium 3.3 (L) 3.5 - 5.1 mmol/L   Chloride 102 101 - 111 mmol/L   CO2 28 22 - 32 mmol/L   Glucose, Bld 99 65 - 99 mg/dL   BUN 18 6 - 20 mg/dL   Creatinine, Ser 0.90 0.44 - 1.00 mg/dL   Calcium 9.4 8.9 - 10.3 mg/dL   Total Protein 8.0 6.5 - 8.1 g/dL   Albumin 3.5 3.5 - 5.0 g/dL   AST 48 (H) 15 - 41 U/L   ALT 26 14 - 54 U/L   Alkaline Phosphatase 73 38 - 126 U/L   Total Bilirubin 1.3 (H) 0.3 - 1.2 mg/dL   GFR calc non Af Amer >60 >60 mL/min   GFR calc Af Amer >60 >60 mL/min   Anion gap 6 5 - 15  Troponin I     Status: None   Collection Time: 06/28/16  3:52 PM  Result Value Ref Range   Troponin I <0.03 <0.03 ng/mL  Glucose, capillary     Status: Abnormal   Collection Time: 06/28/16  4:14 PM  Result Value Ref Range   Glucose-Capillary 100 (H) 65 - 99 mg/dL   ____________________________________________  EKG My review and personal interpretation at Time: 16:51   Indication: ams  Rate: 85  Rhythm: sinus Axis: normal Other: normal intervals, non specific t wave changes ____________________________________________  RADIOLOGY  I personally reviewed all radiographic images ordered to evaluate for the above acute complaints and reviewed radiology reports and  findings.  These findings were personally discussed with the patient.  Please see medical record for radiology report.  ____________________________________________   PROCEDURES  Procedure(s) performed:  Procedures    Critical Care performed: yes CRITICAL CARE Performed by: Merlyn Lot   Total critical care time: 45 minutes  Critical care time was exclusive of separately billable procedures and treating other patients.  Critical care was necessary to treat or prevent imminent or life-threatening deterioration.  Critical care was time spent personally by me on the following activities: development of treatment plan with patient and/or surrogate as well as nursing, discussions with consultants, evaluation of patient's response to treatment, examination of patient, obtaining history from patient or surrogate, ordering and performing treatments and interventions, ordering and review of laboratory studies, ordering and review of radiographic studies, pulse oximetry and re-evaluation of patient's condition.  ____________________________________________   INITIAL IMPRESSION / ASSESSMENT AND PLAN / ED COURSE  Pertinent labs & imaging results that were available during my care of the patient were reviewed by me and considered in my medical decision making (see chart for details).  DDX: cva, bleed, mass, hypoglycemia, dysrhytmia  Elizabeth Bond is a 65 y.o. who presents to the ED critically ill as described above. Patient appeared to be in a postictal state upon arrival and is protecting her airway therefore she was taken emergently to CT scanner to evaluate for evidence of bleed. Unfortunately patient has evidence of metastatic disease to her brain with acute vasogenic edema which would explain her presentation. Not a TPA candidate as this is not consistent with CVA. Patient was given IV Keppra as well as IV Decadron for the swelling. I spoke with Dr. Cari Caraway of neurosurgery who  states that there would not be any emergent indication for neurosurgical intervention. I  spoke with Dr. Jacinto Reap of oncology who agrees to coordinate treatment including radiation therapy. A spoke with Dr. Marthann Schiller currently agrees to admit patient for further evaluation and management.      ____________________________________________   FINAL CLINICAL IMPRESSION(S) / ED DIAGNOSES  Final diagnoses:  Seizure ([place])  Brain tumor ([place])  Vasogenic brain edema ([place])      NEW MEDICATIONS STARTED DURING THIS VISIT:  Current Discharge Medication List       Note:  This document was prepared using Dragon voice recognition software and may include unintentional dictation errors.    Merlyn Lot, MD 06/28/16 2034

## 2016-06-28 NOTE — Consult Note (Signed)
[facility] NOTE  Patient Care Team: Remi Haggard, FNP as PCP - General (Family Medicine) Christene Lye, MD (General Surgery) Dallas Schimke, MD (Internal Medicine) Hessie Knows, MD as Consulting Physician (Orthopedic Surgery)  CHIEF COMPLAINTS/PURPOSE OF CONSULTATION:  Brain metastases breast cancer  HISTORY OF PRESENTING ILLNESS: Please note that patient is a poor historian given mental status changes. No family available.   Elizabeth Bond 66 y.o.  female with a history of metastatic breast cancer HER-2/neu positive currently on TDM-1 [follows up with Dr.Corcoran] since September 2016. Patient's PET scan in May 2018- showed stable osseous metastases; no visible metastases. However showed bilateral subcentimeter cavitary lung nodules. Workup with bronchoscopy was negative. Patient's chemotherapy is on hold since May 2018 given the stable disease.  Today on day of admission patient noted to have mental status changes; seizure-like activity as well with the daughter. Patient received Versed by the EMS. Patient did not have any seizures with this in the emergency room. However as per the nursing/review of note patient had significant confusion/aphasia.  CT scan of the brain showed a 3 cm metastasis in the brain with slight midline shift with significant vasogenic edema.  Patient's confusion/aphasia has improved since receiving the Decadron. Patient also received Keppra.  Patient denies any pain.   ROS: Review of system b cannot be assessed ecause of patient's mental status changes.   MEDICAL HISTORY:  Past Medical History:  Diagnosis Date  . Arthritis   . Breast cancer ([place])    right  . Cancer ([place]) 2015   breast and bone  . COPD (chronic obstructive pulmonary disease) ([place])   . Depression   . GERD (gastroesophageal reflux disease)   . IBS (irritable bowel syndrome)   . 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 ABDOMINAL HYSTERECTOMY  1978  . CATARACT EXTRACTION W/PHACO Right 06/06/2016   Procedure: CATARACT EXTRACTION PHACO AND INTRAOCULAR LENS PLACEMENT (IOC);  Surgeon: Birder Robson, MD;  Location: [facility] ORS;  Service: Ophthalmology;  Laterality: Right;  [place] 00:51.1 AP% 14.0 CDE 7.16 Fluid pack lot # [id number]  . CATARACT EXTRACTION W/PHACO Left 06/27/2016   Procedure: CATARACT EXTRACTION PHACO AND INTRAOCULAR LENS PLACEMENT (IOC);  Surgeon: Birder Robson, MD;  Location: [facility] ORS;  Service: Ophthalmology;  Laterality: Left;  [place] 00:37 AP% 19.1 CDE  7.11 Fluid pack lot # [id number]  . FLEXIBLE BRONCHOSCOPY N/A 05/31/2016   Procedure: FLEXIBLE BRONCHOSCOPY;  Surgeon: Wilhelmina Mcardle, MD;  Location: [facility] ORS;  Service: Pulmonary;  Laterality: N/A;  . JOINT REPLACEMENT    . PORTACATH PLACEMENT  2015  . REPLACEMENT TOTAL KNEE Left 2004-2005?    SOCIAL HISTORY: Social History   Social History  . Marital status: Widowed    Spouse name: N/A  . Number of children: N/A  . Years of education: N/A   Occupational History  . Not on file.   Social History Main Topics  . Smoking status: Current Every Day Smoker    Packs/day: 0.25    Years: 40.00    Types: Cigarettes  . Smokeless tobacco: Never Used  . Alcohol use No  . Drug use: No  . Sexual activity: Not on file   Other Topics Concern  . Not on file   Social History Narrative  . No narrative on file    FAMILY HISTORY: Family History  Problem Relation Age of Onset  . Cancer Father  prostate  . Cancer Mother        Pancreatic  . Cancer Maternal Aunt        breast  . Cancer Cousin        breast  . Cancer Other        lung cancer - neice  . Cancer - Other Daughter        brain    ALLERGIES:  is allergic to fentanyl.  MEDICATIONS:  Current Facility-Administered Medications  Medication Dose Route Frequency Provider Last Rate Last Dose  . [START ON 06/29/2016] dexamethasone  (DECADRON) injection 10 mg  10 mg Intravenous Q24H Vaughan Basta, MD      . levETIRAcetam (KEPPRA) tablet 500 mg  500 mg Oral BID Vaughan Basta, MD       Current Outpatient Prescriptions  Medication Sig Dispense Refill  . Calcium Carb-Cholecalciferol (CALCIUM-VITAMIN D3) 600-400 MG-UNIT TABS Take 1 tablet by mouth 2 (two) times daily.    . Magnesium 500 MG TABS Take 500 mg by mouth daily.     . pantoprazole (PROTONIX) 40 MG tablet Take 1 tablet (40 mg total) by mouth daily. 30 tablet 6  . phosphorus (K PHOS NEUTRAL) 155-852-130 MG tablet Take 1 tablet (250 mg total) by mouth 2 (two) times daily. 60 tablet 2  . potassium chloride (K-DUR) 10 MEQ tablet TAKE ONE TABLET BY MOUTH EVERY DAY 30 tablet 2  . acetaminophen (TYLENOL) 325 MG tablet Take 650 mg by mouth daily as needed for headache.    . loratadine (CLARITIN) 10 MG tablet Take 10 mg by mouth daily as needed for allergies.     Marland Kitchen temazepam (RESTORIL) 15 MG capsule TAKE 1 CAPSULE AT BEDTIME AS NEEDED FOR SLEEP 30 capsule 1      .  PHYSICAL EXAMINATION:  Vitals:   06/28/16 1800 06/28/16 1830  BP: (!) 155/88 (!) 146/77  Pulse: 77 80  Resp: (!) 21 19  Temp:     Filed Weights   06/28/16 1604  Weight: 120 lb (54.4 kg)    GENERAL: Moderately nourished; well-developed patient Alert, no distress and comfortable.   She is alone. EYES: no pallor or icterus OROPHARYNX: no thrush or ulceration. NECK: supple, no masses felt LYMPH:  no palpable lymphadenopathy in the cervical, axillary or inguinal regions LUNGS: decreased breath sounds to auscultation at bases and  No wheeze or crackles HEART/CVS: regular rate & rhythm and no murmurs; No lower extremity edema ABDOMEN: abdomen soft, non-tender and normal bowel sounds Musculoskeletal:no cyanosis of digits and no clubbing  PSYCH: alert & oriented x 1-2.  NEURO: Mild weakness noted in the left upper extremity compared to the right. Gait not tested. SKIN:  no rashes or  significant lesions  LABORATORY DATA:  I have reviewed the data as listed Lab Results  Component Value Date   WBC 5.1 06/28/2016   HGB 13.8 06/28/2016   HCT 39.1 06/28/2016   MCV 97.6 06/28/2016   PLT 164 06/28/2016    Recent Labs  04/14/16 0825 05/04/16 0904 05/25/16 0930 06/23/16 1330 06/28/16 1552  NA 137 137 134* 137 136  K 3.8 3.6 3.5 3.6 3.3*  CL 104 105 104 104 102  CO2 _0 GLUCOSE 92 92 92 120* 99  BUN _1 22* 18  CREATININE 0.64 0.59 0.60 0.78 0.90  CALCIUM 9.6 9.4 8.9 9.5 9.4  GFRNONAA >60 >60 >60 >60 >60  GFRAA >60 >60 >60 >60 >60  PROT 8.3* 8.1 8.3* 8.0  8.0  ALBUMIN 3.5 3.3* 3.3* 3.6 3.5  AST 51* 55* 46* 49* 48*  ALT _0 ALKPHOS 81 94 79 76 73  BILITOT 1.3* 1.2 1.6* 1.3* 1.3*  BILIDIR 0.2 0.3 0.3  --   --     RADIOGRAPHIC STUDIES: I have personally reviewed the radiological images as listed and agreed with the findings in the report. X-ray Chest Pa Or Ap  Result Date: 05/31/2016 CLINICAL DATA:  66 y/o  F; status post bronchoscopy. EXAM: CHEST 1 VIEW COMPARISON:  05/19/2016 PET-CT. FINDINGS: Numerous pulmonary nodules are better characterized on recent PET-CT. No pneumothorax. Left port catheter tip projects over lower SVC. Stable normal cardiac silhouette. Mild S-shaped curvature of the spine. Bone metastasis poorly visualized radiographically. IMPRESSION: Numerous pulmonary nodules are better characterized on recent PET-CT. No pneumothorax. Electronically Signed   By: Kristine Garbe M.D.   On: 05/31/2016 14:48   Dg Fluoro Rm 1-60 Min - No Report  Result Date: 05/31/2016 Fluoroscopy was utilized by the requesting physician.  No radiographic interpretation.   Ct Head Code Stroke W/o Cm  Addendum Date: 06/28/2016   ADDENDUM REPORT: 06/28/2016 16:26 ADDENDUM: Study discussed by telephone with Dr. Merlyn Lot on 06/28/2016 at 1623 hours. Electronically Signed   By: Genevie Ann M.D.   On: 06/28/2016 16:26   Result  Date: 06/28/2016 CLINICAL DATA:  Code stroke. 66 year old female found by daughter this evening with slurred speech and drooling. Metastatic breast cancer. EXAM: CT HEAD WITHOUT CONTRAST TECHNIQUE: Contiguous axial images were obtained from the base of the skull through the vertex without intravenous contrast. COMPARISON:  PET-CT 05/19/2016 FINDINGS: Brain: Confluent bilateral white matter hypodensity in a vasogenic edema pattern, new from the visible brain on 05/19/2016. In the anterior right frontal lobe this surrounds are round hyperdense mass centered at the right caudate measuring 3 cm. In the left occipital lobe there is a 10 mm hyperdense mass evident. Additional masses in the left frontal lobe and right anterior temporal lobe are suspected with surrounding edema, but are not well delineated on this noncontrast study. Intracranial mass effect with slight leftward midline shift. Partially effaced lateral ventricles, but no ventriculomegaly. No acute intracranial hemorrhage identified. No extra-axial collection identified. Basilar cisterns remain patent. No superimposed cortically based acute infarct identified. Vascular: Calcified atherosclerosis at the skull base. No suspicious intracranial vascular hyperdensity. Skull: No acute or suspicious osseous lesion identified in the calvarium or skullbase. Sinuses/Orbits: Visualized paranasal sinuses are stable and well pneumatized. Chronic right mastoid opacification. Left mastoids are clear. Other: No acute orbit or scalp soft tissue finding. ASPECTS [facility] Stroke Program Early CT Score) Total score (0-10 with 10 being normal): Not applicable, multiple brain masses. IMPRESSION: 1. New metastatic disease to the brain since 05/19/2016. Bilateral hemisphere vasogenic edema with partial ventricle effacement and slight leftward midline shift. Follow-up brain MRI without and with contrast - or if MR is contraindicated postcontrast head CT - would allow better staging.  2. No superimposed intracranial hemorrhage or acute cortically based infarct. 3. ASPECTS is Not applicable, multiple brain masses . Electronically Signed: By: Genevie Ann M.D. On: 06/28/2016 16:18    ASSESSMENT & PLAN:   # 66 year old female patient with a history of HER-2/neu positive breast cancer currently on TDM-1 admitted to the hospital for mental status changes/seizures.  # Mental status changes/seizures- likely secondary to vasogenic edema/pain metastasis. Recommend MRI of the brain with and without contrast. Patient started on dexamethasone; clinical improvement noted. Continue Keppra. Consult radiation  oncology. I'll speak to Dr. Donella Stade in the morning. Awaiting neurology evaluation.  # Metastatic breast cancer- HER-2/neu positive on TDM-1 since sep 2016 [s/p 28 cycles]- with no significant evidence of disease noted extracranially- except for stable osseous metastases [as per PET scan in May 2018]. Hold systemic therapy at this time  # Bilateral cavitary pulmonary nodules sub-centimeter- question etiology recent bronchoscopy/pulmonary workup negative.   # I agree with DNR/DNI- as discussed with the hospitalist.  # Thank you Dr. Anselm Jungling  for allowing me to participate in the care of your pleasant patient. Please do not hesitate to contact me with questions or concerns in the interim.  All questions were answered. The patient knows to call the clinic with any problems, questions or concerns.   Cammie Sickle, MD 06/28/2016 6:54 PM

## 2016-06-28 NOTE — Consult Note (Signed)
x

## 2016-06-28 NOTE — ED Notes (Signed)
Pt arrived via ems for c/o "seizure" and posturing to the right - pt also noted to be drooling excessively and unable to speak - does move eyes to verbal commands but not able to follow commands at this time - daughter stated to ems that her mother called her on the phone and was unable to speak and when she arrived at the pt home the pt was drooling and still unable to talk  - this occurred 2 weeks ago and she was told if it happened again to come to the er for eval - pt had cataract surgery yesterday  - pt started posturing in route to hospital and was given Versed 2mg 

## 2016-06-29 ENCOUNTER — Ambulatory Visit: Payer: Medicare Other | Admitting: Radiation Oncology

## 2016-06-29 ENCOUNTER — Inpatient Hospital Stay: Payer: Medicare Other

## 2016-06-29 DIAGNOSIS — C719 Malignant neoplasm of brain, unspecified: Secondary | ICD-10-CM | POA: Diagnosis not present

## 2016-06-29 DIAGNOSIS — R569 Unspecified convulsions: Secondary | ICD-10-CM | POA: Diagnosis not present

## 2016-06-29 DIAGNOSIS — G936 Cerebral edema: Secondary | ICD-10-CM | POA: Diagnosis not present

## 2016-06-29 DIAGNOSIS — C50919 Malignant neoplasm of unspecified site of unspecified female breast: Secondary | ICD-10-CM | POA: Diagnosis not present

## 2016-06-29 DIAGNOSIS — I6523 Occlusion and stenosis of bilateral carotid arteries: Secondary | ICD-10-CM | POA: Diagnosis not present

## 2016-06-29 DIAGNOSIS — Z716 Tobacco abuse counseling: Secondary | ICD-10-CM | POA: Diagnosis not present

## 2016-06-29 LAB — BASIC METABOLIC PANEL
ANION GAP: 5 (ref 5–15)
BUN: 17 mg/dL (ref 6–20)
CHLORIDE: 108 mmol/L (ref 101–111)
CO2: 25 mmol/L (ref 22–32)
Calcium: 9.2 mg/dL (ref 8.9–10.3)
Creatinine, Ser: 0.6 mg/dL (ref 0.44–1.00)
GFR calc non Af Amer: >60 mL/min (ref >60)
GLUCOSE: 133 mg/dL — AB (ref 65–99)
POTASSIUM: 4.2 mmol/L (ref 3.5–5.1)
Sodium: 138 mmol/L (ref 135–145)

## 2016-06-29 LAB — URINALYSIS, COMPLETE (UACMP) WITH MICROSCOPIC
BACTERIA UA: NONE SEEN
BILIRUBIN URINE: NEGATIVE
GLUCOSE, UA: NEGATIVE mg/dL
HGB URINE DIPSTICK: NEGATIVE
Ketones, ur: NEGATIVE mg/dL
LEUKOCYTES UA: NEGATIVE
NITRITE: NEGATIVE
PROTEIN: NEGATIVE mg/dL
Specific Gravity, Urine: 1.021 (ref 1.005–1.030)
pH: 5 (ref 5.0–8.0)

## 2016-06-29 LAB — URINE DRUG SCREEN, QUALITATIVE (ARMC ONLY)
AMPHETAMINES, UR SCREEN: NOT DETECTED
Barbiturates, Ur Screen: NOT DETECTED
Benzodiazepine, Ur Scrn: POSITIVE — AB
COCAINE METABOLITE, UR ~~LOC~~: NOT DETECTED
Cannabinoid 50 Ng, Ur ~~LOC~~: NOT DETECTED
MDMA (ECSTASY) UR SCREEN: NOT DETECTED
METHADONE SCREEN, URINE: NOT DETECTED
Opiate, Ur Screen: NOT DETECTED
Phencyclidine (PCP) Ur S: NOT DETECTED
TRICYCLIC, UR SCREEN: NOT DETECTED

## 2016-06-29 LAB — FUNGUS CULTURE RESULT

## 2016-06-29 LAB — FUNGAL ORGANISM REFLEX

## 2016-06-29 LAB — CBC
HEMATOCRIT: 41.3 % (ref 35.0–47.0)
HEMOGLOBIN: 14.6 g/dL (ref 12.0–16.0)
MCH: 34.3 pg — AB (ref 26.0–34.0)
MCHC: 35.2 g/dL (ref 32.0–36.0)
MCV: 97.3 fL (ref 80.0–100.0)
Platelets: 149 10*3/uL — ABNORMAL LOW (ref 150–440)
RBC: 4.25 MIL/uL (ref 3.80–5.20)
RDW: 15.7 % — ABNORMAL HIGH (ref 11.5–14.5)
WBC: 5.3 10*3/uL (ref 3.6–11.0)

## 2016-06-29 LAB — FUNGUS CULTURE WITH STAIN

## 2016-06-29 MED ORDER — MOXIFLOXACIN HCL 0.5 % OP SOLN
1.0000 [drp] | Freq: Three times a day (TID) | OPHTHALMIC | Status: DC
  Administered 2016-06-29: 1 [drp] via OPHTHALMIC
  Filled 2016-06-29: qty 3

## 2016-06-29 MED ORDER — LEVETIRACETAM 500 MG PO TABS: 500.0000 mg | ORAL_TABLET | Freq: Two times a day (BID) | ORAL | 0 refills | Status: DC

## 2016-06-29 MED ORDER — DEXAMETHASONE 4 MG PO TABS: 4.0000 mg | ORAL_TABLET | Freq: Two times a day (BID) | ORAL | 0 refills | Status: DC

## 2016-06-29 MED ORDER — NEPAFENAC 0.3 % OP SUSP
1.0000 [drp] | Freq: Every day | OPHTHALMIC | Status: DC
  Filled 2016-06-29: qty 3

## 2016-06-29 MED ORDER — DEXAMETHASONE 4 MG PO TABS: 4.0000 mg | ORAL_TABLET | Freq: Two times a day (BID) | ORAL | 0 refills | Status: AC

## 2016-06-29 MED ORDER — LEVETIRACETAM 500 MG PO TABS: 500.0000 mg | ORAL_TABLET | Freq: Two times a day (BID) | ORAL | 0 refills | Status: AC

## 2016-06-29 MED ORDER — DIFLUPREDNATE 0.05 % OP EMUL
1.0000 [drp] | Freq: Two times a day (BID) | OPHTHALMIC | Status: DC
  Administered 2016-06-29: 13:00:00 1 [drp] via OPHTHALMIC
  Filled 2016-06-29: qty 5

## 2016-06-29 NOTE — Progress Notes (Signed)
Chart reviewed. No reported dysphagia. Pt has discharge orders in for today. Will defer ST eval as Pt is being discharged at this time.

## 2016-06-29 NOTE — Discharge Summary (Signed)
[facility] NAME: Elizabeth Bond    MR#:  [id number]  DATE OF BIRTH:  1950/02/07  DATE OF ADMISSION:  06/28/2016 ADMITTING PHYSICIAN: Vaughan Basta, MD  DATE OF DISCHARGE: 06/29/2016  PRIMARY CARE PHYSICIAN: Remi Haggard, FNP    ADMISSION DIAGNOSIS:  Seizure ([place]) [R56.9] Brain tumor ([place]) [D49.6] Brain metastases ([place]) [C79.31] Vasogenic brain edema ([place]) [G93.6] Cerebral infarction ([place]) [I63.9] CVA (cerebral infarction) [I63.9] Reflux gastritis [K29.60]  DISCHARGE DIAGNOSIS:  Principal Problem:   Seizures ([place])   SECONDARY DIAGNOSIS:   Past Medical History:  Diagnosis Date  . Arthritis   . Breast cancer ([place])    right  . Cancer ([place]) 2015   breast and bone  . COPD (chronic obstructive pulmonary disease) ([place])   . Depression   . GERD (gastroesophageal reflux disease)   . IBS (irritable bowel syndrome)   . Metastasis to bone [facility]) 09/01/2014    HOSPITAL COURSE:   66 year old female with history of metastatic breast cancer who presents with new onset seizures and acute encephalopathy.  1. Acute metabolic encephalopathy and eizures due to vasogenic edema and metastatic disease to brain.  Patient has been evaluated by radiation oncology and oncology. I briefly spoke with the neurologist on the phone regarding her seizures. Patient will continue Keppra 500 twice a day and Decadron 4 mg by mouth twice a day She'll follow-up with radiation oncology tomorrow.  2. Metastatic breast cancer: Patient will follow-up with her oncologist  3. GERD: Continue PPI  4. Tobacco dependence: Patient is encouraged to quit smoking. Counseling was provided for 4 minutes.     DISCHARGE CONDITIONS AND DIET:   Stable Regular diet  CONSULTS OBTAINED:  Treatment Team:  Alexis Goodell, MD Cammie Sickle, MD  DRUG ALLERGIES:   Allergies  Allergen Reactions  . Fentanyl Palpitations and Other (See Comments)     "loopy and confused" "feels like heart is going to come out of my chest"    DISCHARGE MEDICATIONS:   Current Discharge Medication List    START taking these medications   Details  dexamethasone (DECADRON) 4 MG tablet Take 1 tablet (4 mg total) by mouth 2 (two) times daily with a meal. Qty: 60 tablet, Refills: 0    levETIRAcetam (KEPPRA) 500 MG tablet Take 1 tablet (500 mg total) by mouth 2 (two) times daily. Qty: 60 tablet, Refills: 0      CONTINUE these medications which have NOT CHANGED   Details  Calcium Carb-Cholecalciferol (CALCIUM-VITAMIN D3) 600-400 MG-UNIT TABS Take 1 tablet by mouth 2 (two) times daily.    Magnesium 500 MG TABS Take 500 mg by mouth daily.     pantoprazole (PROTONIX) 40 MG tablet Take 1 tablet (40 mg total) by mouth daily. Qty: 30 tablet, Refills: 6   Associated Diagnoses: Malignant neoplasm of right breast in female, estrogen receptor positive, unspecified site of breast ([place]); Metastasis to bone [facility]); Elevated bilirubin    phosphorus (K PHOS NEUTRAL) 155-852-130 MG tablet Take 1 tablet (250 mg total) by mouth 2 (two) times daily. Qty: 60 tablet, Refills: 2   Associated Diagnoses: Metastasis to bone ([place]); Malignant neoplasm of right breast in female, estrogen receptor positive, unspecified site of breast ([place]); Elevated bilirubin    potassium chloride (K-DUR) 10 MEQ tablet TAKE ONE TABLET BY MOUTH EVERY DAY Qty: 30 tablet, Refills: 2   Associated Diagnoses: Metastasis to bone ([place]); Encounter for antineoplastic chemotherapy; Malignant neoplasm of right breast in female, estrogen receptor  positive, unspecified site of breast ([place]); Elevated bilirubin    acetaminophen (TYLENOL) 325 MG tablet Take 650 mg by mouth daily as needed for headache.    loratadine (CLARITIN) 10 MG tablet Take 10 mg by mouth daily as needed for allergies.    Associated Diagnoses: Malignant neoplasm of right female breast, unspecified site of breast; Metastasis to bone ([facility])     temazepam (RESTORIL) 15 MG capsule TAKE 1 CAPSULE AT BEDTIME AS NEEDED FOR SLEEP Qty: 30 capsule, Refills: 1   Associated Diagnoses: Malignant neoplasm of right breast in female, estrogen receptor positive, unspecified site of breast ([place]); Metastasis to bone [facility]); Elevated bilirubin; Encounter for antineoplastic chemotherapy; Hypomagnesemia; Hypokalemia; Hypocalcemia          Today   CHIEF COMPLAINT:  Wants to go home today Evaluated by Dr Baruch Gouty this am Had staring speel after breakfst MS is at baseline   VITAL SIGNS:  Blood pressure (!) 157/59, pulse (!) 59, temperature 97.5 F (36.4 C), temperature source Oral, resp. rate 14, height 5\' 3"  (1.6 m), weight 54.4 kg (120 lb), SpO2 96 %.   REVIEW OF SYSTEMS:  Review of Systems  Constitutional: Negative.  Negative for chills, fever and malaise/fatigue.  HENT: Negative.  Negative for ear discharge, ear pain, hearing loss, nosebleeds and sore throat.   Eyes: Negative.  Negative for blurred vision and pain.  Respiratory: Negative.  Negative for cough, hemoptysis, shortness of breath and wheezing.   Cardiovascular: Negative.  Negative for chest pain, palpitations and leg swelling.  Gastrointestinal: Negative.  Negative for abdominal pain, blood in stool, diarrhea, nausea and vomiting.  Genitourinary: Negative.  Negative for dysuria.  Musculoskeletal: Negative.  Negative for back pain.  Skin: Negative.   Neurological: Positive for seizures. Negative for dizziness, tremors, speech change, focal weakness and headaches.  Endo/Heme/Allergies: Negative.  Does not bruise/bleed easily.  Psychiatric/Behavioral: Negative.  Negative for depression, hallucinations and suicidal ideas.     PHYSICAL EXAMINATION:  GENERAL:  66 y.o.-year-old patient lying in the bed with no acute distress.  NECK:  Supple, no jugular venous distention. No thyroid enlargement, no tenderness.  LUNGS: Normal breath sounds bilaterally, no wheezing,  rales,rhonchi  No use of accessory muscles of respiration.  CARDIOVASCULAR: S1, S2 normal. No murmurs, rubs, or gallops.  ABDOMEN: Soft, non-tender, non-distended. Bowel sounds present. No organomegaly or mass.  EXTREMITIES: No pedal edema, cyanosis, or clubbing.  PSYCHIATRIC: The patient is alert and oriented x 3.  SKIN: No obvious rash, lesion, or ulcer.   DATA REVIEW:   CBC  Recent Labs Lab 06/29/16 0421  WBC 5.3  HGB 14.6  HCT 41.3  PLT 149*    Chemistries   Recent Labs Lab 06/28/16 1552 06/29/16 0421  NA 136 138  K 3.3* 4.2  CL 102 108  CO2 28 25  GLUCOSE 99 133*  BUN 18 17  CREATININE 0.90 0.60  CALCIUM 9.4 9.2  AST 48*  --   ALT 26  --   ALKPHOS 73  --   BILITOT 1.3*  --     Cardiac Enzymes  Recent Labs Lab 06/28/16 1552  TROPONINI <0.03    Microbiology Results  @[email or web address] @  RADIOLOGY:  Mr Jeri Cos Wo Contrast  Result Date: 06/28/2016 CLINICAL DATA:  Breast cancer with metastases to the brain EXAM: MRI HEAD WITHOUT AND WITH CONTRAST TECHNIQUE: Multiplanar, multiecho pulse sequences of the brain and surrounding structures were obtained without and with intravenous contrast. CONTRAST:  70mL MULTIHANCE GADOBENATE DIMEGLUMINE 529 MG/ML IV SOLN  COMPARISON:  Head CT 06/28/2016 FINDINGS: Brain: There is no acute infarct. There are multiple contrast-enhancing masses with a large amount of surrounding vasogenic edema. The largest intraparenchymal lesions are as follows: 1. Right caudate nucleus, 3.2 x 2.8 cm, series 12, image 83 2. Left frontal operculum, 3.2 x 2.3 cm, image 87 3. Left occipital lobe, 1.2 x 1.0 cm, image 82 : There are other parenchymal lesions within the right periatrial white matter, cerebellar vermis and right cerebellar hemisphere. Additionally, there are multiple lesions that appear to be dural-based: 1. Left frontal convexity, 2.0 x 1.3 cm, image 101 2. Posterior left convexity 1.9 x 1.3 cm, image 88 3. Posterior right middle cranial fossa,  2.5 x 1.8 cm, image 46 Vasogenic edema is greatest surrounding the right caudate, left frontal operculum and left parietal and occipital lesions. There is compression of the frontal horn of the right lateral ventricle. No midline shift. No hydrocephalus. No intraparenchymal hematoma or chronic microhemorrhage. Brain volume is normal for age without age-advanced or lobar predominant atrophy. Vascular: Major intracranial arterial and venous sinus flow voids are preserved. Skull and upper cervical spine: The visualized skull base, calvarium, upper cervical spine and extracranial soft tissues are normal. No calvarial lesions. Sinuses/Orbits: No fluid levels or advanced mucosal thickening. Right mastoid effusion. Normal orbits. IMPRESSION: 1. Multiple mixed parenchymal and extra-axial intracranial masses with reduced diffusivity consistent with hypercellularity. While metastatic breast cancer is a possibility, the location of the lesions and their diffusion characteristics, in combination with their dense appearance on the concomitant head CT, are also consistent with lymphoma. 2. Extensive vasogenic edema surrounding the larger lesions of the bilateral frontal lobes and left parietal occipital lesions. No midline shift or hydrocephalus. Electronically Signed   By: Ulyses Jarred M.D.   On: 06/28/2016 20:54   US Carotid Bilateral  Result Date: 06/29/2016 CLINICAL DATA:  CVA.  Slurred speech . EXAM: BILATERAL CAROTID DUPLEX ULTRASOUND TECHNIQUE: Pearline Cables scale imaging, color Doppler and duplex ultrasound were performed of bilateral carotid and vertebral arteries in the neck. COMPARISON:  MRI 06/28/2016.  CT 06/28/2016. FINDINGS: Criteria: Quantification of carotid stenosis is based on velocity parameters that correlate the residual internal carotid diameter with NASCET-based stenosis levels, using the diameter of the distal internal carotid lumen as the denominator for stenosis measurement. The following velocity  measurements were obtained: RIGHT ICA:  63/16 cm/sec CCA:  10/25 cm/sec SYSTOLIC ICA/CCA RATIO:  0.9 DIASTOLIC ICA/CCA RATIO:  1.5 ECA:  105 cm/sec LEFT ICA:  57/15 cm/sec CCA:  85/27 cm/sec SYSTOLIC ICA/CCA RATIO:  0.6 DIASTOLIC ICA/CCA RATIO:  1.3 ECA:  120 cm/sec RIGHT CAROTID ARTERY: Punctate plaque noted in the right common carotid, carotid bifurcation, proximal ICA. No flow limiting stenosis. RIGHT VERTEBRAL ARTERY:  Patent with antegrade flow. LEFT CAROTID ARTERY: Punctate plaque left common carotid. No flow limiting stenosis. LEFT VERTEBRAL ARTERY:  Patent with antegrade flow. IMPRESSION: 1. Punctate plaque noted in the right common carotid, carotid bifurcation, proximal ICA. No flow limiting stenosis. Degree of stenosis less than 50%. 2. Punctate plaque left common carotid. No flow limiting stenosis. Degree of stenosis less 50%. 3. Vertebrals are patent with antegrade flow. Electronically Signed   By: Marcello Moores  Register   On: 06/29/2016 10:20   Ct Head Code Stroke W/o Cm  Addendum Date: 06/28/2016   ADDENDUM REPORT: 06/28/2016 16:26 ADDENDUM: Study discussed by telephone with Dr. Merlyn Lot on 06/28/2016 at 1623 hours. Electronically Signed   By: Genevie Ann M.D.   On: 06/28/2016 16:26  Result Date: 06/28/2016 CLINICAL DATA:  Code stroke. 66 year old female found by daughter this evening with slurred speech and drooling. Metastatic breast cancer. EXAM: CT HEAD WITHOUT CONTRAST TECHNIQUE: Contiguous axial images were obtained from the base of the skull through the vertex without intravenous contrast. COMPARISON:  PET-CT 05/19/2016 FINDINGS: Brain: Confluent bilateral white matter hypodensity in a vasogenic edema pattern, new from the visible brain on 05/19/2016. In the anterior right frontal lobe this surrounds are round hyperdense mass centered at the right caudate measuring 3 cm. In the left occipital lobe there is a 10 mm hyperdense mass evident. Additional masses in the left frontal lobe and right  anterior temporal lobe are suspected with surrounding edema, but are not well delineated on this noncontrast study. Intracranial mass effect with slight leftward midline shift. Partially effaced lateral ventricles, but no ventriculomegaly. No acute intracranial hemorrhage identified. No extra-axial collection identified. Basilar cisterns remain patent. No superimposed cortically based acute infarct identified. Vascular: Calcified atherosclerosis at the skull base. No suspicious intracranial vascular hyperdensity. Skull: No acute or suspicious osseous lesion identified in the calvarium or skullbase. Sinuses/Orbits: Visualized paranasal sinuses are stable and well pneumatized. Chronic right mastoid opacification. Left mastoids are clear. Other: No acute orbit or scalp soft tissue finding. ASPECTS [facility] Stroke Program Early CT Score) Total score (0-10 with 10 being normal): Not applicable, multiple brain masses. IMPRESSION: 1. New metastatic disease to the brain since 05/19/2016. Bilateral hemisphere vasogenic edema with partial ventricle effacement and slight leftward midline shift. Follow-up brain MRI without and with contrast - or if MR is contraindicated postcontrast head CT - would allow better staging. 2. No superimposed intracranial hemorrhage or acute cortically based infarct. 3. ASPECTS is Not applicable, multiple brain masses . Electronically Signed: By: Genevie Ann M.D. On: 06/28/2016 16:18      Current Discharge Medication List    START taking these medications   Details  dexamethasone (DECADRON) 4 MG tablet Take 1 tablet (4 mg total) by mouth 2 (two) times daily with a meal. Qty: 60 tablet, Refills: 0    levETIRAcetam (KEPPRA) 500 MG tablet Take 1 tablet (500 mg total) by mouth 2 (two) times daily. Qty: 60 tablet, Refills: 0      CONTINUE these medications which have NOT CHANGED   Details  Calcium Carb-Cholecalciferol (CALCIUM-VITAMIN D3) 600-400 MG-UNIT TABS Take 1 tablet by mouth 2 (two)  times daily.    Magnesium 500 MG TABS Take 500 mg by mouth daily.     pantoprazole (PROTONIX) 40 MG tablet Take 1 tablet (40 mg total) by mouth daily. Qty: 30 tablet, Refills: 6   Associated Diagnoses: Malignant neoplasm of right breast in female, estrogen receptor positive, unspecified site of breast ([place]); Metastasis to bone [facility]); Elevated bilirubin    phosphorus (K PHOS NEUTRAL) 155-852-130 MG tablet Take 1 tablet (250 mg total) by mouth 2 (two) times daily. Qty: 60 tablet, Refills: 2   Associated Diagnoses: Metastasis to bone ([place]); Malignant neoplasm of right breast in female, estrogen receptor positive, unspecified site of breast ([place]); Elevated bilirubin    potassium chloride (K-DUR) 10 MEQ tablet TAKE ONE TABLET BY MOUTH EVERY DAY Qty: 30 tablet, Refills: 2   Associated Diagnoses: Metastasis to bone ([place]); Encounter for antineoplastic chemotherapy; Malignant neoplasm of right breast in female, estrogen receptor positive, unspecified site of breast ([place]); Elevated bilirubin    acetaminophen (TYLENOL) 325 MG tablet Take 650 mg by mouth daily as needed for headache.    loratadine (CLARITIN) 10 MG tablet Take  10 mg by mouth daily as needed for allergies.    Associated Diagnoses: Malignant neoplasm of right female breast, unspecified site of breast; Metastasis to bone ([facility])    temazepam (RESTORIL) 15 MG capsule TAKE 1 CAPSULE AT BEDTIME AS NEEDED FOR SLEEP Qty: 30 capsule, Refills: 1   Associated Diagnoses: Malignant neoplasm of right breast in female, estrogen receptor positive, unspecified site of breast ([place]); Metastasis to bone [facility]); Elevated bilirubin; Encounter for antineoplastic chemotherapy; Hypomagnesemia; Hypokalemia; Hypocalcemia           Management plans discussed with the patient and she is in agreement. Stable for discharge home  Patient should follow up with dr Massie Maroon  CODE STATUS:     Code Status Orders        Start     Ordered   06/28/16 2044  Do  not attempt resuscitation (DNR)  Continuous    Question Answer Comment  In the event of cardiac or respiratory ARREST Do not call a "code blue"   In the event of cardiac or respiratory ARREST Do not perform Intubation, CPR, defibrillation or ACLS   In the event of cardiac or respiratory ARREST Use medication by any route, position, wound care, and other measures to relive pain and suffering. May use oxygen, suction and manual treatment of airway obstruction as needed for comfort.   Comments pt confirmed this in presence of her daughter and grand daughter.      06/28/16 2043    Code Status History    Date Active Date Inactive Code Status Order ID Comments User Context   06/28/2016  6:24 PM 06/28/2016  8:43 PM DNR [id number]  Vaughan Basta, MD ED   12/23/2014  3:41 AM 12/24/2014  2:31 PM Full Code [id number]  Harrie Foreman, MD Inpatient      TOTAL TIME TAKING CARE OF THIS PATIENT: 37 minutes.    Note: This dictation was prepared with Dragon dictation along with smaller phrase technology. Any transcriptional errors that result from this process are unintentional.  Brynn Mulgrew M.D on 06/29/2016 at 11:56 AM  Between 7am to 6pm - Pager - [phone] After 6pm go to [email or web address] - password [email or web address] [place]  Office  [phone]  CC: Primary care physician; Remi Haggard, FNP

## 2016-06-29 NOTE — Evaluation (Signed)
Physical Therapy Evaluation Patient Details Name: Elizabeth Bond MRN: [id number] DOB: 02-Apr-1950 Today's Date: 06/29/2016   History of Present Illness  Pt is a 66y/o female admitted for seizures. Pt with hx of breast CA with recent imaging findings of mets to bone and brain per MD notes.  Clinical Impression  Pt reported she was IND in all ADLs and ambulated without AD, prior to admission. Pt was able to perform all bed mobility, txfs, and amb. Without AD or assist during session. Pt is eager to go home, and has the necessary assist at home prn. No LOB or decr. Safety awareness noted during session. Therefore, no further PT f/u is needed at this time.     Follow Up Recommendations No PT follow up    Equipment Recommendations       Recommendations for Other Services       Precautions / Restrictions Precautions Precautions: Fall Restrictions Weight Bearing Restrictions: No      Mobility  Bed Mobility Overal bed mobility: Independent             General bed mobility comments: Pt demonstrated safe and proper technique.  Transfers Overall transfer level: Independent Equipment used: None             General transfer comment: Pt demonstrated safe and proper technique, no LOB.  Ambulation/Gait Ambulation/Gait assistance: Independent Ambulation Distance (Feet): 150 Feet Assistive device: None Gait Pattern/deviations: WFL(Within Functional Limits);Step-through pattern     General Gait Details: Pt could've amb. longer distances, but did not as MD waiting for pt. No LOB noted during amb.  Stairs            Wheelchair Mobility    Modified Rankin (Stroke Patients Only)       Balance Overall balance assessment: No apparent balance deficits (not formally assessed)                                           Pertinent Vitals/Pain Pain Assessment: No/denies pain    Home Living Family/patient expects to be discharged to:: Private  residence Living Arrangements: Other relatives (grandson) Available Help at Discharge: Family Type of Home: House Home Access: Level entry     Home Layout: One level Home Equipment: None      Prior Function Level of Independence: Independent               Hand Dominance        Extremity/Trunk Assessment   Upper Extremity Assessment Upper Extremity Assessment: Overall WFL for tasks assessed    Lower Extremity Assessment Lower Extremity Assessment: Overall WFL for tasks assessed       Communication   Communication: No difficulties  Cognition Arousal/Alertness: Awake/alert Behavior During Therapy: WFL for tasks assessed/performed Overall Cognitive Status: Within Functional Limits for tasks assessed                                        General Comments      Exercises     Assessment/Plan    PT Assessment Patent does not need any further PT services  PT Problem List         PT Treatment Interventions      PT Goals (Current goals can be found in the Care Plan section)  Acute Rehab PT  Goals Patient Stated Goal: To get out of here PT Goal Formulation: With patient/family Potential to Achieve Goals:  (not needed)    Frequency     Barriers to discharge        Co-evaluation               AM-PAC PT "6 Clicks" Daily Activity  Outcome Measure Difficulty turning over in bed (including adjusting bedclothes, sheets and blankets)?: None Difficulty moving from lying on back to sitting on the side of the bed? : None Difficulty sitting down on and standing up from a chair with arms (e.g., wheelchair, bedside commode, etc,.)?: None Help needed moving to and from a bed to chair (including a wheelchair)?: None Help needed walking in hospital room?: None Help needed climbing 3-5 steps with a railing? : None 6 Click Score: 24    End of Session Equipment Utilized During Treatment: Gait belt Activity Tolerance: Patient tolerated treatment  well Patient left: in bed;with bed alarm set;with family/visitor present        Time: 1216-2446 PT Time Calculation (min) (ACUTE ONLY): 13 min   Charges:   PT Evaluation $PT Eval Low Complexity: 1 Procedure     PT G CodesGeoffry Paradise, PT,DPT 06/29/16 11:56 AM     Zinedine Ellner L 06/29/2016, 11:54 AM

## 2016-06-29 NOTE — Care Management CC44 (Signed)
Condition Code 44 Documentation Completed  Patient Details  Name: Elizabeth Bond MRN: [id number] Date of Birth: 22-Oct-1950   Condition Code 44 given:  Yes Patient signature on Condition Code 44 notice:  Yes Documentation of 2 MD's agreement:  Yes Code 44 added to claim:  Yes    Shelbie Ammons, RN 06/29/2016, 12:51 PM

## 2016-06-29 NOTE — Progress Notes (Signed)
PT Cancellation Note  Patient Details Name: Elizabeth Bond MRN: [id number] DOB: December 09, 1950   Cancelled Treatment:    Reason Eval/Treat Not Completed: Other (comment). Pt eating breakfast and on the phone, and declined PT at this time. PT will reattempt eval at a later date/time, when pt is available.  Geoffry Paradise, PT,DPT 06/29/16 9:11 AM      [name] 06/29/2016, 9:08 AM

## 2016-06-29 NOTE — Care Management (Signed)
Family states that they are unable to pay for medications. Traditional Medicare is listed as insurance. Medication Management application given.  States they may try to get medications per [place] RN MSN [place] Management [phone]

## 2016-06-29 NOTE — Progress Notes (Signed)
Writer called to room by daughter at bedside. Daughter reports less than a minute of staring activity, drooling, and not responding to questions. Patient is asymptomatic at this time.  Seizure precautions initiated, bed pads placed. Dr. Benjie Karvonen (attending physician) called and notified. No new orders at this time. Will continue to monitor patient closely.  Madlyn Frankel, RN

## 2016-06-29 NOTE — Progress Notes (Signed)
Discharge instructions given and went over with patient and patients daughter at bedside. Prescriptions reviewed. All questions answered. Patient discharged home with daughter via wheelchair by nursing staff.   Medication management application provided to patient due to financial concerns.   Madlyn Frankel, RN

## 2016-06-29 NOTE — Progress Notes (Signed)
Radiation Oncology Follow up Note old patient new area  Name: Elizabeth Bond   Date:   06/28/2016 MRN:  [id number] DOB: 1950-11-28    This 66 y.o. 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  REFERRING PROVIDER: No ref. provider found  HPI: Patient is a 66 year old female treated 3 years prior. To her thoracic spine for metastatic stage IV breast cancer. She recently was admitted with confusion as well as several seizures. CT the head without contrast showed metastatic disease confirmed on MRI scan showing multiple parenchymal extra-axial metastasis consistent with metastatic breast cancer. There is also extensive vasogenic edema around the larger lesions. Patient has been started on anticonvulsive medication as well as Decadron. I was asked to evaluate her in-house for consideration of palliative radiation therapy. She is doing fairly well at this time she does have some problems with grasping the right word expressive aphasia. She specifically denies any focal neurologic deficits or change in visual fields.  COMPLICATIONS OF TREATMENT: none  FOLLOW UP COMPLIANCE: keeps appointments   PHYSICAL EXAM:  BP 128/63 (BP Location: Right Arm)   Pulse 82   Temp 98.7 F (37.1 C) (Oral)   Resp 16   Ht 5\' 3"  (1.6 m)   Wt 120 lb (54.4 kg)   SpO2 98%   BMI 21.26 kg/m  Well-developed well-nourished patient in NAD. HEENT reveals PERLA, EOMI, discs not visualized.  Oral cavity is clear. No oral mucosal lesions are identified. Neck is clear without evidence of cervical or supraclavicular adenopathy. Lungs are clear to A&P. Cardiac examination is essentially unremarkable with regular rate and rhythm without murmur rub or thrill. Abdomen is benign with no organomegaly or masses noted. Motor sensory and DTR levels are equal and symmetric in the upper and lower extremities. Cranial nerves II through XII are grossly intact.  Proprioception is intact. No peripheral adenopathy or edema is identified. No motor or sensory levels are noted. Crude visual fields are within normal range.  RADIOLOGY RESULTS: MRI scans are reviewed and compatible with the above-stated findings  PLAN: At the present time I like to start whole brain radiation therapy. Would plan on delivering 3000 cGy in 10 fractions. I have personally set up and ordered CT simulation for tomorrow. Risks and benefits of treatment including possible cognitive decline, hair loss fatigue alteration of blood counts all were discussed in detail with the patient. She will be sent home on oral Decadron medication. Patient and family both seem to comprehend my treatment plan well.  I would like to take this opportunity to thank you for allowing me to participate in the care of your patient.Armstead Peaks., MD

## 2016-06-30 ENCOUNTER — Ambulatory Visit
Admission: RE | Admit: 2016-06-30 | Discharge: 2016-06-30 | Disposition: A | Discharge status: Home / Self Care | Payer: Medicare Other | Source: Ambulatory Visit | Attending: Radiation Oncology | Admitting: Radiation Oncology

## 2016-06-30 DIAGNOSIS — Z510 Encounter for antineoplastic radiation therapy: Secondary | ICD-10-CM | POA: Insufficient documentation

## 2016-06-30 DIAGNOSIS — C7931 Secondary malignant neoplasm of brain: Secondary | ICD-10-CM | POA: Insufficient documentation

## 2016-06-30 DIAGNOSIS — C719 Malignant neoplasm of brain, unspecified: Secondary | ICD-10-CM | POA: Diagnosis not present

## 2016-06-30 LAB — HEMOGLOBIN A1C
Hgb A1c MFr Bld: 5.3 % (ref 4.8–5.6)
MEAN PLASMA GLUCOSE: 105 mg/dL

## 2016-07-03 ENCOUNTER — Other Ambulatory Visit: Payer: Self-pay | Admitting: *Deleted

## 2016-07-03 DIAGNOSIS — C7931 Secondary malignant neoplasm of brain: Secondary | ICD-10-CM | POA: Diagnosis not present

## 2016-07-03 DIAGNOSIS — C719 Malignant neoplasm of brain, unspecified: Secondary | ICD-10-CM | POA: Diagnosis not present

## 2016-07-03 DIAGNOSIS — Z510 Encounter for antineoplastic radiation therapy: Secondary | ICD-10-CM | POA: Diagnosis not present

## 2016-07-04 ENCOUNTER — Ambulatory Visit
Admission: RE | Admit: 2016-07-04 | Discharge: 2016-07-04 | Disposition: A | Discharge status: Home / Self Care | Payer: Medicare Other | Source: Ambulatory Visit | Attending: Radiation Oncology | Admitting: Radiation Oncology

## 2016-07-04 DIAGNOSIS — C719 Malignant neoplasm of brain, unspecified: Secondary | ICD-10-CM | POA: Diagnosis not present

## 2016-07-04 DIAGNOSIS — Z510 Encounter for antineoplastic radiation therapy: Secondary | ICD-10-CM | POA: Diagnosis not present

## 2016-07-04 DIAGNOSIS — C7931 Secondary malignant neoplasm of brain: Secondary | ICD-10-CM | POA: Diagnosis not present

## 2016-07-06 ENCOUNTER — Inpatient Hospital Stay: Payer: Medicare Other | Attending: Internal Medicine | Admitting: Internal Medicine

## 2016-07-06 ENCOUNTER — Ambulatory Visit
Admission: RE | Admit: 2016-07-06 | Discharge: 2016-07-06 | Disposition: A | Discharge status: Home / Self Care | Payer: Medicare Other | Source: Ambulatory Visit | Attending: Radiation Oncology | Admitting: Radiation Oncology

## 2016-07-06 ENCOUNTER — Encounter: Payer: Self-pay | Admitting: Internal Medicine

## 2016-07-06 VITALS — BP 121/69 | HR 60 | Temp 97.3°F | Wt 116.6 lb

## 2016-07-06 DIAGNOSIS — K219 Gastro-esophageal reflux disease without esophagitis: Secondary | ICD-10-CM | POA: Diagnosis not present

## 2016-07-06 DIAGNOSIS — Z66 Do not resuscitate: Secondary | ICD-10-CM | POA: Diagnosis not present

## 2016-07-06 DIAGNOSIS — R569 Unspecified convulsions: Secondary | ICD-10-CM | POA: Insufficient documentation

## 2016-07-06 DIAGNOSIS — E876 Hypokalemia: Secondary | ICD-10-CM | POA: Diagnosis not present

## 2016-07-06 DIAGNOSIS — Z170 Estrogen receptor positive status [ER+]: Secondary | ICD-10-CM

## 2016-07-06 DIAGNOSIS — I219 Acute myocardial infarction, unspecified: Secondary | ICD-10-CM | POA: Diagnosis not present

## 2016-07-06 DIAGNOSIS — K589 Irritable bowel syndrome without diarrhea: Secondary | ICD-10-CM | POA: Insufficient documentation

## 2016-07-06 DIAGNOSIS — Z79899 Other long term (current) drug therapy: Secondary | ICD-10-CM | POA: Diagnosis not present

## 2016-07-06 DIAGNOSIS — C7951 Secondary malignant neoplasm of bone: Secondary | ICD-10-CM | POA: Diagnosis not present

## 2016-07-06 DIAGNOSIS — F329 Major depressive disorder, single episode, unspecified: Secondary | ICD-10-CM | POA: Diagnosis not present

## 2016-07-06 DIAGNOSIS — F17210 Nicotine dependence, cigarettes, uncomplicated: Secondary | ICD-10-CM | POA: Diagnosis not present

## 2016-07-06 DIAGNOSIS — J449 Chronic obstructive pulmonary disease, unspecified: Secondary | ICD-10-CM | POA: Insufficient documentation

## 2016-07-06 DIAGNOSIS — C50911 Malignant neoplasm of unspecified site of right female breast: Secondary | ICD-10-CM

## 2016-07-06 DIAGNOSIS — Z510 Encounter for antineoplastic radiation therapy: Secondary | ICD-10-CM | POA: Diagnosis not present

## 2016-07-06 DIAGNOSIS — C7931 Secondary malignant neoplasm of brain: Secondary | ICD-10-CM | POA: Diagnosis not present

## 2016-07-06 DIAGNOSIS — D890 Polyclonal hypergammaglobulinemia: Secondary | ICD-10-CM | POA: Diagnosis not present

## 2016-07-06 HISTORY — DX: Secondary malignant neoplasm of brain: C79.31

## 2016-07-06 NOTE — Progress Notes (Signed)
[facility] day:  07/06/2016  Chief Complaint: Elizabeth Bond is a 66 y.o. female with metastatic Her2/neu positive right breast cancer who is seen for assessment prior to cycle #29 Kadcyla.  HPI:  The patient was last seen in the medical oncology clinic on 05/25/2016.  At that time, Elizabeth Bond was postponed secondary to pulmonary nodules.  She denied any respiratory symptoms. She denied any exposure to TB.  ANA, quantiferon gold, and ANCA titers were negative.  She was referred to pulmonary medicine.  She underwent bronchoscopy on 05/31/2016. Brushings were native.  In the interim she was admitted to the hospital on 06/28/16. She had several witnessed seizures where she became unresponsive on the way to the hospital. A CT scan showed brain mets. She was given IV Keppra and IV Decadron with improvement. She was referred to Dr. Donella Stade for whole brain radiation which she will begin today (07/06/16) for 10 days.    Symptomatically, she feels okay. She has periods of confusion and aphasia. She continues to take Decadron 37m BID and Keppra 5041mBID. She feels weak. She "feels down". She denies any fevers. She is still trying to quit smoking. She had her left cataract removed 06/27/2016.  Past Medical History:  Diagnosis Date  . Arthritis   . Breast cancer ([facility]   right  . Cancer ([facility]2015   breast and bone  . COPD (chronic obstructive pulmonary disease) ([facility]  . Depression   . GERD (gastroesophageal reflux disease)   . IBS (irritable bowel syndrome)   . Metastasis to bone ([facility]8/30/2016    Past Surgical History:  Procedure Laterality Date  . ABDOMINAL HYSTERECTOMY  1978  . CATARACT EXTRACTION W/PHACO Right 06/06/2016   Procedure: CATARACT EXTRACTION PHACO AND INTRAOCULAR LENS PLACEMENT (IOC);  Surgeon: PoBirder RobsonMD;  Location: [facility] ORS;  Service: Ophthalmology;  Laterality: Right;  [place]:51.1 AP% 14.0 CDE 7.16 Fluid pack lot # [id number]  .  CATARACT EXTRACTION W/PHACO Left 06/27/2016   Procedure: CATARACT EXTRACTION PHACO AND INTRAOCULAR LENS PLACEMENT (IOC);  Surgeon: PoBirder RobsonMD;  Location: [facility] ORS;  Service: Ophthalmology;  Laterality: Left;  [place]:37 AP% 19.1 CDE  7.11 Fluid pack lot # [id number]  . FLEXIBLE BRONCHOSCOPY N/A 05/31/2016   Procedure: FLEXIBLE BRONCHOSCOPY;  Surgeon: SiWilhelmina McardleMD;  Location: [facility] ORS;  Service: Pulmonary;  Laterality: N/A;  . JOINT REPLACEMENT    . PORTACATH PLACEMENT  2015  . REPLACEMENT TOTAL KNEE Left 2004-2005?    Family History  Problem Relation Age of Onset  . Cancer Father        prostate  . Cancer Mother        Pancreatic  . Cancer Maternal Aunt        breast  . Cancer Cousin        breast  . Cancer Other        lung cancer - neice  . Cancer - Other Daughter        brain    Social History:  reports that she has been smoking Cigarettes.  She has a 10.00 pack-year smoking history. She has never used smokeless tobacco. She reports that she does not drink alcohol or use drugs.  Her [place]'s [place] The patient is accompanied by her daughter today.  Allergies:  Allergies  Allergen Reactions  . Fentanyl Palpitations and Other (See Comments)    "loopy and confused" "  feels like heart is going to come out of my chest"    Current Medications: Current Outpatient Prescriptions  Medication Sig Dispense Refill  . acetaminophen (TYLENOL) 325 MG tablet Take 650 mg by mouth daily as needed for headache.    . Calcium Carb-Cholecalciferol (CALCIUM-VITAMIN D3) 600-400 MG-UNIT TABS Take 1 tablet by mouth 2 (two) times daily.    Mar[place] Kitchen dexamethasone (DECADRON) 4 MG tablet Take 1 tablet (4 mg total) by mouth 2 (two) times daily with a meal. 60 tablet 0  . levETIRAcetam (KEPPRA) 500 MG tablet Take 1 tablet (500 mg total) by mouth 2 (two) times daily. 60 tablet 0  . loratadine (CLARITIN) 10 MG tablet Take 10 mg by mouth daily as needed for  allergies.     . Magnesium 500 MG TABS Take 500 mg by mouth daily.     . pantoprazole (PROTONIX) 40 MG tablet Take 1 tablet (40 mg total) by mouth daily. 30 tablet 6  . phosphorus (K PHOS NEUTRAL) 155-852-130 MG tablet Take 1 tablet (250 mg total) by mouth 2 (two) times daily. 60 tablet 2  . potassium chloride (K-DUR) 10 MEQ tablet TAKE ONE TABLET BY MOUTH EVERY DAY 30 tablet 2  . temazepam (RESTORIL) 15 MG capsule TAKE 1 CAPSULE AT BEDTIME AS NEEDED FOR SLEEP 30 capsule 1   No current facility-administered medications for this visit.     Review of Systems:  GENERAL:  Feels "okay".  No fevers or sweats.  Weight up 1 pound. PERFORMANCE STATUS (ECOG):  0 HEENT:  Vision improved s/p cataract surgery.  Dry eyes.  No runny nose, sore throat, mouth sores or tenderness. Lungs: No shortness of breath.  Chronic cough.  No hemoptysis. Cardiac:  No chest pain, palpitations, orthopnea, or PND.  Blood pressure up as worried about scans GI:  No nausea, vomiting, diarrhea, constipation, melena or hematochezia. GU:  No urgency, frequency, dysuria, or hematuria. Musculoskeletal:  Weakness. No shoulder pain. No back pain.  No joint pain.  No muscle tenderness. Extremities:  No pain or swelling. Skin:  No rashes or skin changes. Neuro:  No headache, numbness or weakness. No seizure activity. Some balance and coordination issues with ambulation. Endocrine:  No diabetes, thyroid issues, hot flashes or night sweats. Psych:  Insomnia improved on Restoril.  No mood changes, depression or anxiety. Pain:  No focal pain.   Review of systems:  All other systems reviewed and found to be negative.   Physical Exam: Blood pressure 121/69, pulse 60, temperature (!) 97.3 F (36.3 C), temperature source Tympanic, weight 116 lb 9 oz (52.9 kg). GENERAL:  Thin woman sitting comfortably in the exam room in no acute distress. MENTAL STATUS:  Alert and oriented to person, place and time.  HEAD:  Short styled gray hair.   Normocephalic, atraumatic, face symmetric, no Cushingoid features. EYES: Hazel eyes.  Pupils equal round and reactive to light and accomodation.  No conjunctivitis or scleral icterus. ENT:  Oropharynx clear without lesion.  Tongue normal. Mucous membranes moist.  RESPIRATORY:  Clear to auscultation without rales, wheezes or rhonchi. CARDIOVASCULAR:  Regular rate and rhythm without murmur, rub or gallop.  No JVD. ABDOMEN:  Soft, non-tender, with active bowel sounds, and no hepatosplenomegaly.  No masses. SKIN:  No rashes, ulcers or lesions. EXTREMITIES: No swelling.  No skin discoloration or tenderness.  No palpable cords. LYMPH NODES: No palpable cervical, supraclavicular, axillary or inguinal adenopathy  NEUROLOGICAL:  Appropriate. PSYCH:  Appropriate.    No visits with results within  3 Day(s) from this visit.  Latest known visit with results is:  Admission on 06/28/2016, Discharged on 06/29/2016  Component Date Value Ref Range Status  . Prothrombin Time 06/28/2016 13.2  11.4 - 15.2 seconds Final  . INR 06/28/2016 1.00   Final  . aPTT 06/28/2016 37* 24 - 36 seconds Final   Comment:        IF BASELINE aPTT IS ELEVATED, SUGGEST PATIENT RISK ASSESSMENT BE USED TO DETERMINE APPROPRIATE ANTICOAGULANT THERAPY.   . WBC 06/28/2016 5.1  3.6 - 11.0 K/uL Final  . RBC 06/28/2016 4.01  3.80 - 5.20 MIL/uL Final  . Hemoglobin 06/28/2016 13.8  12.0 - 16.0 g/dL Final  . HCT 06/28/2016 39.1  35.0 - 47.0 % Final  . MCV 06/28/2016 97.6  80.0 - 100.0 fL Final  . MCH 06/28/2016 34.4* 26.0 - 34.0 pg Final  . MCHC 06/28/2016 35.2  32.0 - 36.0 g/dL Final  . RDW 06/28/2016 16.2* 11.5 - 14.5 % Final  . Platelets 06/28/2016 164  150 - 440 K/uL Final  . Neutrophils Relative % 06/28/2016 70  % Final  . Neutro Abs 06/28/2016 3.6  1.4 - 6.5 K/uL Final  . Lymphocytes Relative 06/28/2016 19  % Final  . Lymphs Abs 06/28/2016 1.0  1.0 - 3.6 K/uL Final  . Monocytes Relative 06/28/2016 9  % Final  . Monocytes  Absolute 06/28/2016 0.5  0.2 - 0.9 K/uL Final  . Eosinophils Relative 06/28/2016 1  % Final  . Eosinophils Absolute 06/28/2016 0.1  0 - 0.7 K/uL Final  . Basophils Relative 06/28/2016 1  % Final  . Basophils Absolute 06/28/2016 0.0  0 - 0.1 K/uL Final  . Sodium 06/28/2016 136  135 - 145 mmol/L Final  . Potassium 06/28/2016 3.3* 3.5 - 5.1 mmol/L Final  . Chloride 06/28/2016 102  101 - 111 mmol/L Final  . CO2 06/28/2016 28  22 - 32 mmol/L Final  . Glucose, Bld 06/28/2016 99  65 - 99 mg/dL Final  . BUN 06/28/2016 18  6 - 20 mg/dL Final  . Creatinine, Ser 06/28/2016 0.90  0.44 - 1.00 mg/dL Final  . Calcium 06/28/2016 9.4  8.9 - 10.3 mg/dL Final  . Total Protein 06/28/2016 8.0  6.5 - 8.1 g/dL Final  . Albumin 06/28/2016 3.5  3.5 - 5.0 g/dL Final  . AST 06/28/2016 48* 15 - 41 U/L Final  . ALT 06/28/2016 26  14 - 54 U/L Final  . Alkaline Phosphatase 06/28/2016 73  38 - 126 U/L Final  . Total Bilirubin 06/28/2016 1.3* 0.3 - 1.2 mg/dL Final  . GFR calc non Af Amer 06/28/2016 >60  >60 mL/min Final  . GFR calc Af Amer 06/28/2016 >60  >60 mL/min Final   Comment: (NOTE) The eGFR has been calculated using the CKD EPI equation. This calculation has not been validated in all clinical situations. eGFR's persistently <60 mL/min signify possible Chronic Kidney Disease.   . Anion gap 06/28/2016 6  5 - 15 Final  . Troponin I 06/28/2016 <0.03  <0.03 ng/mL Final  . Tricyclic, Ur Screen 67/20/9470 NONE DETECTED  NONE DETECTED Final  . Amphetamines, Ur Screen 06/29/2016 NONE DETECTED  NONE DETECTED Final  . MDMA (Ecstasy)Ur Screen 06/29/2016 NONE DETECTED  NONE DETECTED Final  . Cocaine Metabolite,Ur [place] 06/29/2016 NONE DETECTED  NONE DETECTED Final  . Opiate, Ur Screen 06/29/2016 NONE DETECTED  NONE DETECTED Final  . Phencyclidine (PCP) Ur S 06/29/2016 NONE DETECTED  NONE DETECTED Final  . Cannabinoid 50  Ng, Ur [place] 06/29/2016 NONE DETECTED  NONE DETECTED Final  . Barbiturates, Ur Screen 06/29/2016 NONE  DETECTED  NONE DETECTED Final  . Benzodiazepine, Ur Scrn 06/29/2016 POSITIVE* NONE DETECTED Final  . Methadone Scn, Ur 06/29/2016 NONE DETECTED  NONE DETECTED Final   Comment: (NOTE) 202  Tricyclics, urine               Cutoff 1000 ng/mL 200  Amphetamines, urine             Cutoff 1000 ng/mL 300  MDMA (Ecstasy), urine           Cutoff 500 ng/mL 400  Cocaine Metabolite, urine       Cutoff 300 ng/mL 500  Opiate, urine                   Cutoff 300 ng/mL 600  Phencyclidine (PCP), urine      Cutoff 25 ng/mL 700  Cannabinoid, urine              Cutoff 50 ng/mL 800  Barbiturates, urine             Cutoff 200 ng/mL 900  Benzodiazepine, urine           Cutoff 200 ng/mL 1000 Methadone, urine                Cutoff 300 ng/mL 1100 1200 The urine drug screen provides only a preliminary, unconfirmed 1300 analytical test result and should not be used for non-medical 1400 purposes. Clinical consideration and professional judgment should 1500 be applied to any positive drug screen result due to possible 1600 interfering substances. A more specific alternate chemical method 1700 must be used in order to obtain a confirmed analytical result.  1800 Gas chromato                          graphy / mass spectrometry (GC/MS) is the preferred 1900 confirmatory method.   . Color, Urine 06/29/2016 YELLOW* YELLOW Final  . APPearance 06/29/2016 CLEAR* CLEAR Final  . Specific Gravity, Urine 06/29/2016 1.021  1.005 - 1.030 Final  . pH 06/29/2016 5.0  5.0 - 8.0 Final  . Glucose, UA 06/29/2016 NEGATIVE  NEGATIVE mg/dL Final  . Hgb urine dipstick 06/29/2016 NEGATIVE  NEGATIVE Final  . Bilirubin Urine 06/29/2016 NEGATIVE  NEGATIVE Final  . Ketones, ur 06/29/2016 NEGATIVE  NEGATIVE mg/dL Final  . Protein, ur 06/29/2016 NEGATIVE  NEGATIVE mg/dL Final  . Nitrite 06/29/2016 NEGATIVE  NEGATIVE Final  . Leukocytes, UA 06/29/2016 NEGATIVE  NEGATIVE Final  . RBC / HPF 06/29/2016 0-5  0 - 5 RBC/hpf Final  . WBC, UA  06/29/2016 0-5  0 - 5 WBC/hpf Final  . Bacteria, UA 06/29/2016 NONE SEEN  NONE SEEN Final  . Squamous Epithelial / LPF 06/29/2016 0-5* NONE SEEN Final  . Mucous 06/29/2016 PRESENT   Final  . Glucose-Capillary 06/28/2016 100* 65 - 99 mg/dL Final  . Hgb A1c MFr Bld 06/28/2016 5.3  4.8 - 5.6 % Final   Comment: (NOTE)         Pre-diabetes: 5.7 - 6.4         Diabetes: >6.4         Glycemic control for adults with diabetes: <7.0   . Mean Plasma Glucose 06/28/2016 105  mg/dL Final   Comment: (NOTE) Performed At: [facility] [place], [place] [id number] Lindon Romp MD SE:[id number]   . Cholesterol 06/28/2016  201* 0 - 200 mg/dL Final  . Triglycerides 06/28/2016 95  <150 mg/dL Final  . HDL 06/28/2016 73  >40 mg/dL Final  . Total CHOL/HDL Ratio 06/28/2016 2.8  RATIO Final  . VLDL 06/28/2016 19  0 - 40 mg/dL Final  . LDL Cholesterol 06/28/2016 109* 0 - 99 mg/dL Final   Comment:        Total Cholesterol/HDL:CHD Risk Coronary Heart Disease Risk Table                     Men   Women  1/2 Average Risk   3.4   3.3  Average Risk       5.0   4.4  2 X Average Risk   9.6   7.1  3 X Average Risk  23.4   11.0        Use the calculated Patient Ratio above and the CHD Risk Table to determine the patient's CHD Risk.        ATP III CLASSIFICATION (LDL):  <100     mg/dL   Optimal  100-129  mg/dL   Near or Above                    Optimal  130-159  mg/dL   Borderline  160-189  mg/dL   High  >190     mg/dL   Very High   . Sodium 06/29/2016 138  135 - 145 mmol/L Final  . Potassium 06/29/2016 4.2  3.5 - 5.1 mmol/L Final  . Chloride 06/29/2016 108  101 - 111 mmol/L Final  . CO2 06/29/2016 25  22 - 32 mmol/L Final  . Glucose, Bld 06/29/2016 133* 65 - 99 mg/dL Final  . BUN 06/29/2016 17  6 - 20 mg/dL Final  . Creatinine, Ser 06/29/2016 0.60  0.44 - 1.00 mg/dL Final  . Calcium 06/29/2016 9.2  8.9 - 10.3 mg/dL Final  . GFR calc non Af Amer 06/29/2016 >60  >60 mL/min Final   . GFR calc Af Amer 06/29/2016 >60  >60 mL/min Final   Comment: (NOTE) The eGFR has been calculated using the CKD EPI equation. This calculation has not been validated in all clinical situations. eGFR's persistently <60 mL/min signify possible Chronic Kidney Disease.   . Anion gap 06/29/2016 5  5 - 15 Final  . WBC 06/29/2016 5.3  3.6 - 11.0 K/uL Final  . RBC 06/29/2016 4.25  3.80 - 5.20 MIL/uL Final  . Hemoglobin 06/29/2016 14.6  12.0 - 16.0 g/dL Final  . HCT 06/29/2016 41.3  35.0 - 47.0 % Final  . MCV 06/29/2016 97.3  80.0 - 100.0 fL Final  . MCH 06/29/2016 34.3* 26.0 - 34.0 pg Final  . MCHC 06/29/2016 35.2  32.0 - 36.0 g/dL Final  . RDW 06/29/2016 15.7* 11.5 - 14.5 % Final  . Platelets 06/29/2016 149* 150 - 440 K/uL Final    Assessment:  KAMYIA THOMASON is a 66 y.o. female with metastatic Her2/neu positive right breast cancer.  She initially presented in 03/2013 with an ulcerated breast mass and back pain.  Breast biopsy revealed lobular breast cancer.  Tumor was ER/PR positive and HER-2/neu positive. PET scan revealed bone metastasis. She received palliative radiation to T8 and T10 from 04/02-04/15/2015.  She declined chemotherapy. She initially began letrozole and Tykerb. Tykerb was discontinued after 1 dose. She received letrozole from 05/02/2013 until 01/07/2014 .  She received Herceptin from 06/02/2013 until 12/15/2013. She received fulvestrant monthly from 01/07/2014  until 05/27/2014.    She has received Xgeva monthly (03/31/2013 until 08/25/2014; last 03/23/2016).  She was switched to every 6 weeks Xgeva to coordinate with her treatment.  Herceptin was discontinued for a rising CA-27-29.  CA27.29 was 916.3 on 03/19/2013, 286.2 on 06/23/2013, 70.8 on 08/25/2013, 56.3 on 10/08/2013, 102.7 on 12/15/2013, 149.4 on 01/07/2014, 178.0 on 02/04/2014, 190.7 on 03/30/2014, 143.2 on 04/22/2014, 134.8 on 05/20/2014, 248.3 on 07/09/2014, 723.2 on 08/25/2014, 252.3 on 10/08/2014, 126 on 10/29/2014,  71.3 on 11/24/2014, 55 on 12/15/2014, 70.6 on 01/05/2015, 48.6 on 01/26/2015, 52.7 on 02/15/2015, 54.7 on 03/30/2015, 54 on 04/20/2015, 55.2 on 06/01/2015, 48.8 on 06/22/2015, 49.7 08/03/2015, 44.6 on 08/24/2015, 44.5 on 09/16/2015, 44.8 on 10/07/2015, 52.1 on 10/28/2015, 47.0 on 11/18/2015, 49.9 on 12/09/2015, 46.7 on 12/30/2015, 53.3 on 02/03/2016, 49.3 on 02/24/2016, 56.3 on 03/23/2016, 57.3 on 04/27/2016, and 65.7 on 05/04/2016.  She received Ibrance and Faslodex from 02/23/2014 until 07/27/2014.    Echo on 09/04/2014 revealed an EF of 55-65%.  Echo on 12/14/2014, 03/15/2015, 06/15/2015 revealed an EF of 55-60%, 50% on 09/23/2015, 60-65% on 11/16/2015, 60-65% on 02/16/2016, and 60-65% on 05/19/2016.   PET scan on 08/28/2014 revealed significant interval worsening and multifocal osseous metastatic disease. Lesions had increased in number and metabolic activity. There were no extra osseous metastasis.  She is s/p 28 cycles of Kadcyla (09/10/2014 - 05/04/2016).  She is tolerating treatment well.  She denies any bone pain.   PET scan on 02/08/2015 revealed a complete metabolic response in the sclerotic osseous metastases, with no residual hypermetabolic metastatic disease.  PET scan on 08/23/2015 revealed no new or progressive hypermetabolic metastatic disease.   Bone scan on 07/30/2015 revealed multifocal osseous metastases (sternum, thoracolumbar spine, multiple ribs, and right iliac crest).  The dominant left acetabular metastasis noted on prior PET was not evident. Some of the other sites of disease appear to have progressed.  There was no prior bone scan for comparison.  PET scan on 08/23/2015 revealed no new or progressive hypermetabolic disease.  There was stable mildly hypermetabolic sclerotic metastasis in the right proximal femoral metaphysis.  There was stable nonspecific mild patchy hypermetabolism in the upper and lower spine correlating with mixed lytic and sclerotic osseous metastases in  the CT images  Bone scan on 02/16/2016 revealed multiple osseous metastasis with distribution of metastasis similar to that seen on the previous exam.  PET scan on 05/19/2016 revealed similar appearance of osseous metastasis with mild decrease in the index lesion hypermetabolism.  There was no evidence of soft tissue metastasis.  There was the development of innumerable, partially cavitary upper lobe predominant pulmonary nodules.   Bronchoscopy on 05/31/2016 revealed no evidence of tumor or infection.  She was admitted from 12/22/2014 - 12/24/2014 with E coli sepsis secondary to a UTI.  Serum protein was elevated.  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  She has intermittent electrolytes issues (hypokalemia, hypomagnesemia, and hypocalcemia).  She is on supplementation.  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 Total bilirubin is 1.3 today.  She was admitted on 06/28/16 for several witnessed seizures. She was unresponsive, drooling and posturing to the right. CT scan showed evidence of metastatic brain with acute vasogenic edema. She was given IV Keppra as well as IV Decadron for the swelling. She was admitted for observation. Her status was changed to DNR. She was referred to Dr. Donella Stade and she is starting whole brain radiation today 07/06/2016.  Symptomatically, she feels good.  She is weak. Neuro  exam is otherwise normal.   Plan: 1.  RTC as scheduled to see Dr. Mike Gip on 07/18/16 for labs and MD assess (CBC with diff, CMP, CA27.29). 2.  Continue Decadron and Keppra. Side effects of long term steroids discussed (thrush and abdominal discomfort).  3.  Discussed confusion. Spoke about suspending driving while she recovers and due to seizures. She stated she understood. 4.  Encouraged frequent ambulation and strength training. 5.  Seeing Dr. Donella Stade today to begin 10 days of whole brain radiation.   Elizabeth Casa, NP 07/06/2016 12:21  PM    Elizabeth Hawking, NP  07/06/2016, 12:21 PM

## 2016-07-07 ENCOUNTER — Ambulatory Visit
Admission: RE | Admit: 2016-07-07 | Discharge: 2016-07-07 | Disposition: A | Discharge status: Home / Self Care | Payer: Medicare Other | Source: Ambulatory Visit | Attending: Radiation Oncology | Admitting: Radiation Oncology

## 2016-07-07 DIAGNOSIS — Z510 Encounter for antineoplastic radiation therapy: Secondary | ICD-10-CM | POA: Diagnosis not present

## 2016-07-07 DIAGNOSIS — C7931 Secondary malignant neoplasm of brain: Secondary | ICD-10-CM | POA: Diagnosis not present

## 2016-07-10 ENCOUNTER — Other Ambulatory Visit: Payer: Self-pay | Admitting: Hematology and Oncology

## 2016-07-10 ENCOUNTER — Ambulatory Visit
Admission: RE | Admit: 2016-07-10 | Discharge: 2016-07-10 | Disposition: A | Discharge status: Home / Self Care | Payer: Medicare Other | Source: Ambulatory Visit | Attending: Radiation Oncology | Admitting: Radiation Oncology

## 2016-07-10 NOTE — Progress Notes (Signed)
06/29/16 1150  PT Visit Information  Last PT Received On 06/29/16  History of Present Illness Pt is a 66y/o female admitted for seizures. Pt with hx of breast CA with recent imaging findings of mets to bone and brain per MD notes.  Precautions  Precautions Fall  Restrictions  Weight Bearing Restrictions No  Home Living  Family/patient expects to be discharged to: Private residence  Living Arrangements Other relatives (grandson)  Available Help at Discharge Family  Type of [place] Access Level entry  [place] None  Prior Function  Level of Independence Independent  Communication  Communication No difficulties  Pain Assessment  Pain Assessment No/denies pain  Cognition  Arousal/Alertness Awake/alert  Behavior During Therapy WFL for tasks assessed/performed  Overall Cognitive Status Within Functional Limits for tasks assessed  Upper Extremity Assessment  Upper Extremity Assessment Overall WFL for tasks assessed  Lower Extremity Assessment  Lower Extremity Assessment Overall WFL for tasks assessed  Bed Mobility  Overal bed mobility Independent  General bed mobility comments Pt demonstrated safe and proper technique.  Transfers  Overall transfer level Independent  Equipment used None  General transfer comment Pt demonstrated safe and proper technique, no LOB.  Ambulation/Gait  Ambulation/Gait assistance Independent  Ambulation Distance (Feet) 150 Feet  Assistive device None  Gait Pattern/deviations WFL(Within Functional Limits);Step-through pattern  General Gait Details Pt could've amb. longer distances, but did not as MD waiting for pt. No LOB noted during amb.  Balance  Overall balance assessment No apparent balance deficits (not formally assessed)  PT - End of Session  Equipment Utilized During Treatment Gait belt  Activity Tolerance Patient tolerated treatment well  Patient left in bed;with bed alarm set;with family/visitor  present  PT Assessment  PT Recommendation/Assessment Patent does not need any further PT services  No Skilled PT All education completed;Patient at baseline level of functioning;Patient is independent with all acitivity/mobility  AM-PAC PT "6 Clicks" Daily Activity Outcome Measure  Difficulty turning over in bed (including adjusting bedclothes, sheets and blankets)? 4  Difficulty moving from lying on back to sitting on the side of the bed?  4  Difficulty sitting down on and standing up from a chair with arms (e.g., wheelchair, bedside commode, etc,.)? 4  Help needed moving to and from a bed to chair (including a wheelchair)? 4  Help needed walking in hospital room? 4  Help needed climbing 3-5 steps with a railing?  4  6 Click Score 24  Mobility G Code  CH  PT Recommendation  Follow Up Recommendations No PT follow up  Acute Rehab PT Goals  Patient Stated Goal To get out of here  PT Goal Formulation With patient/family  Potential to Achieve Goals (not needed)  PT Time Calculation  PT Start Time (ACUTE ONLY) 1129  PT Stop Time (ACUTE ONLY) 1142  PT Time Calculation (min) (ACUTE ONLY) 13 min  PT G-Codes **NOT FOR INPATIENT CLASS**  Functional Assessment Tool Used AM-PAC 6 Clicks Basic Mobility  Functional Limitation Mobility: Walking and moving around  Mobility: Walking and Moving Around Current Status (X4481) CH  Mobility: Walking and Moving Around Goal Status (E5631) CH  Mobility: Walking and Moving Around Discharge Status (S9702) [facility]  PT General Charges  $$ ACUTE PT VISIT 1 Procedure  PT Evaluation  $PT Eval Low Complexity 1 Procedure   Late-entry g-codes added after review of initial evaluation/documentation completed by Geoffry Paradise, PT.  Reyes Ivan. Owens Shark, PT, DPT, NCS 07/10/16,  2:52 PM [phone]

## 2016-07-11 ENCOUNTER — Ambulatory Visit: Payer: Medicare Other

## 2016-07-11 ENCOUNTER — Emergency Department: Payer: Medicare Other

## 2016-07-11 ENCOUNTER — Encounter
Admission: EM | Disposition: A | Discharge status: Home Health | Payer: Self-pay | Source: Home / Self Care | Attending: Internal Medicine

## 2016-07-11 ENCOUNTER — Telehealth: Payer: Self-pay | Admitting: *Deleted

## 2016-07-11 ENCOUNTER — Encounter: Payer: Self-pay | Admitting: Emergency Medicine

## 2016-07-11 ENCOUNTER — Inpatient Hospital Stay
Admission: EM | Admit: 2016-07-11 | Discharge: 2016-07-14 | DRG: 247 | Disposition: A | Discharge status: Home Health | Payer: Medicare Other | Attending: Internal Medicine | Admitting: Internal Medicine

## 2016-07-11 DIAGNOSIS — I2102 ST elevation (STEMI) myocardial infarction involving left anterior descending coronary artery: Secondary | ICD-10-CM | POA: Diagnosis not present

## 2016-07-11 DIAGNOSIS — I2510 Atherosclerotic heart disease of native coronary artery without angina pectoris: Secondary | ICD-10-CM | POA: Diagnosis not present

## 2016-07-11 DIAGNOSIS — I2111 ST elevation (STEMI) myocardial infarction involving right coronary artery: Secondary | ICD-10-CM | POA: Diagnosis present

## 2016-07-11 DIAGNOSIS — R918 Other nonspecific abnormal finding of lung field: Secondary | ICD-10-CM | POA: Diagnosis not present

## 2016-07-11 DIAGNOSIS — E440 Moderate protein-calorie malnutrition: Secondary | ICD-10-CM | POA: Insufficient documentation

## 2016-07-11 DIAGNOSIS — K589 Irritable bowel syndrome without diarrhea: Secondary | ICD-10-CM | POA: Diagnosis present

## 2016-07-11 DIAGNOSIS — F17210 Nicotine dependence, cigarettes, uncomplicated: Secondary | ICD-10-CM | POA: Diagnosis present

## 2016-07-11 DIAGNOSIS — I10 Essential (primary) hypertension: Secondary | ICD-10-CM | POA: Diagnosis present

## 2016-07-11 DIAGNOSIS — F419 Anxiety disorder, unspecified: Secondary | ICD-10-CM | POA: Diagnosis present

## 2016-07-11 DIAGNOSIS — G40909 Epilepsy, unspecified, not intractable, without status epilepticus: Secondary | ICD-10-CM | POA: Diagnosis present

## 2016-07-11 DIAGNOSIS — Z79899 Other long term (current) drug therapy: Secondary | ICD-10-CM | POA: Diagnosis not present

## 2016-07-11 DIAGNOSIS — R4189 Other symptoms and signs involving cognitive functions and awareness: Secondary | ICD-10-CM | POA: Diagnosis not present

## 2016-07-11 DIAGNOSIS — C50919 Malignant neoplasm of unspecified site of unspecified female breast: Secondary | ICD-10-CM | POA: Diagnosis not present

## 2016-07-11 DIAGNOSIS — Z66 Do not resuscitate: Secondary | ICD-10-CM | POA: Diagnosis present

## 2016-07-11 DIAGNOSIS — F329 Major depressive disorder, single episode, unspecified: Secondary | ICD-10-CM | POA: Diagnosis present

## 2016-07-11 DIAGNOSIS — I2119 ST elevation (STEMI) myocardial infarction involving other coronary artery of inferior wall: Secondary | ICD-10-CM | POA: Diagnosis not present

## 2016-07-11 DIAGNOSIS — I959 Hypotension, unspecified: Secondary | ICD-10-CM | POA: Diagnosis present

## 2016-07-11 DIAGNOSIS — Z961 Presence of intraocular lens: Secondary | ICD-10-CM | POA: Diagnosis present

## 2016-07-11 DIAGNOSIS — K219 Gastro-esophageal reflux disease without esophagitis: Secondary | ICD-10-CM | POA: Diagnosis present

## 2016-07-11 DIAGNOSIS — C50911 Malignant neoplasm of unspecified site of right female breast: Secondary | ICD-10-CM | POA: Diagnosis present

## 2016-07-11 DIAGNOSIS — C7951 Secondary malignant neoplasm of bone: Secondary | ICD-10-CM | POA: Diagnosis present

## 2016-07-11 DIAGNOSIS — Z9842 Cataract extraction status, left eye: Secondary | ICD-10-CM

## 2016-07-11 DIAGNOSIS — I213 ST elevation (STEMI) myocardial infarction of unspecified site: Secondary | ICD-10-CM | POA: Diagnosis present

## 2016-07-11 DIAGNOSIS — R51 Headache: Secondary | ICD-10-CM | POA: Diagnosis not present

## 2016-07-11 DIAGNOSIS — R519 Headache, unspecified: Secondary | ICD-10-CM

## 2016-07-11 DIAGNOSIS — Z7189 Other specified counseling: Secondary | ICD-10-CM | POA: Diagnosis not present

## 2016-07-11 DIAGNOSIS — M1990 Unspecified osteoarthritis, unspecified site: Secondary | ICD-10-CM | POA: Diagnosis present

## 2016-07-11 DIAGNOSIS — Z9841 Cataract extraction status, right eye: Secondary | ICD-10-CM

## 2016-07-11 DIAGNOSIS — Z515 Encounter for palliative care: Secondary | ICD-10-CM

## 2016-07-11 DIAGNOSIS — J449 Chronic obstructive pulmonary disease, unspecified: Secondary | ICD-10-CM | POA: Diagnosis present

## 2016-07-11 DIAGNOSIS — C7931 Secondary malignant neoplasm of brain: Secondary | ICD-10-CM | POA: Diagnosis present

## 2016-07-11 DIAGNOSIS — G8929 Other chronic pain: Secondary | ICD-10-CM

## 2016-07-11 DIAGNOSIS — R079 Chest pain, unspecified: Secondary | ICD-10-CM | POA: Diagnosis not present

## 2016-07-11 HISTORY — PX: LEFT HEART CATH AND CORONARY ANGIOGRAPHY: CATH118249

## 2016-07-11 HISTORY — PX: CORONARY/GRAFT ACUTE MI REVASCULARIZATION: CATH118305

## 2016-07-11 LAB — TROPONIN I: TROPONIN I: 16.2 ng/mL — AB (ref <0.03)

## 2016-07-11 LAB — CBC WITH DIFFERENTIAL/PLATELET
BASOS ABS: 0 10*3/uL (ref 0–0.1)
BASOS PCT: 0 %
Eosinophils Absolute: 0 10*3/uL (ref 0–0.7)
Eosinophils Relative: 0 %
HCT: 44.4 % (ref 35.0–47.0)
Hemoglobin: 15.1 g/dL (ref 12.0–16.0)
Lymphocytes Relative: 3 %
Lymphs Abs: 0.3 10*3/uL — ABNORMAL LOW (ref 1.0–3.6)
MCH: 33.4 pg (ref 26.0–34.0)
MCHC: 34.1 g/dL (ref 32.0–36.0)
MCV: 97.8 fL (ref 80.0–100.0)
MONO ABS: 0.6 10*3/uL (ref 0.2–0.9)
MONOS PCT: 5 %
NEUTROS PCT: 92 %
Neutro Abs: 10.8 10*3/uL — ABNORMAL HIGH (ref 1.4–6.5)
Platelets: 136 10*3/uL — ABNORMAL LOW (ref 150–440)
RBC: 4.54 MIL/uL (ref 3.80–5.20)
RDW: 16.9 % — AB (ref 11.5–14.5)
WBC: 11.7 10*3/uL — ABNORMAL HIGH (ref 3.6–11.0)

## 2016-07-11 LAB — APTT: APTT: 83 s — AB (ref 24–36)

## 2016-07-11 LAB — BASIC METABOLIC PANEL
Anion gap: 8 (ref 5–15)
BUN: 43 mg/dL — ABNORMAL HIGH (ref 6–20)
CALCIUM: 9.6 mg/dL (ref 8.9–10.3)
CO2: 28 mmol/L (ref 22–32)
CREATININE: 0.7 mg/dL (ref 0.44–1.00)
Chloride: 104 mmol/L (ref 101–111)
Glucose, Bld: 113 mg/dL — ABNORMAL HIGH (ref 65–99)
Potassium: 4.9 mmol/L (ref 3.5–5.1)
Sodium: 140 mmol/L (ref 135–145)

## 2016-07-11 LAB — MRSA PCR SCREENING: MRSA by PCR: NEGATIVE

## 2016-07-11 LAB — PROTIME-INR
INR: 1.81
PROTHROMBIN TIME: 21.2 s — AB (ref 11.4–15.2)

## 2016-07-11 LAB — POCT ACTIVATED CLOTTING TIME: Activated Clotting Time: 318 seconds

## 2016-07-11 LAB — GLUCOSE, CAPILLARY: Glucose-Capillary: 97 mg/dL (ref 65–99)

## 2016-07-11 SURGERY — LEFT HEART CATH AND CORONARY ANGIOGRAPHY
Anesthesia: Moderate Sedation

## 2016-07-11 MED ORDER — LISINOPRIL 5 MG PO TABS
5.0000 mg | ORAL_TABLET | Freq: Two times a day (BID) | ORAL | Status: DC
  Administered 2016-07-11: 5 mg via ORAL
  Filled 2016-07-11 (×3): qty 0.5
  Filled 2016-07-11 (×2): qty 1
  Filled 2016-07-11 (×2): qty 0.5

## 2016-07-11 MED ORDER — BIVALIRUDIN BOLUS VIA INFUSION - CUPID
INTRAVENOUS | Status: DC | PRN
  Administered 2016-07-11: 39.45 mg via INTRAVENOUS

## 2016-07-11 MED ORDER — TEMAZEPAM 15 MG PO CAPS: 15.0000 mg | ORAL_CAPSULE | Freq: Every evening | ORAL | Status: DC | PRN

## 2016-07-11 MED ORDER — SODIUM CHLORIDE 0.9% FLUSH
3.0000 mL | Freq: Two times a day (BID) | INTRAVENOUS | Status: DC
  Administered 2016-07-12 – 2016-07-14 (×4): 3 mL via INTRAVENOUS

## 2016-07-11 MED ORDER — ASPIRIN 81 MG PO CHEW
81.0000 mg | CHEWABLE_TABLET | Freq: Every day | ORAL | Status: DC
  Administered 2016-07-12 – 2016-07-14 (×3): 81 mg via ORAL
  Filled 2016-07-11 (×3): qty 1

## 2016-07-11 MED ORDER — SODIUM CHLORIDE 0.9 % WEIGHT BASED INFUSION
1.0000 mL/kg/h | INTRAVENOUS | Status: DC
  Administered 2016-07-11: 100 mL/h via INTRAVENOUS

## 2016-07-11 MED ORDER — SODIUM CHLORIDE 0.9 % IV SOLN
0.2500 mg/kg/h | INTRAVENOUS | Status: AC
  Administered 2016-07-11: 0.25 mg/kg/h via INTRAVENOUS
  Filled 2016-07-11: qty 250

## 2016-07-11 MED ORDER — CALCIUM CARBONATE-VITAMIN D 500-200 MG-UNIT PO TABS
1.0000 | ORAL_TABLET | Freq: Two times a day (BID) | ORAL | Status: DC
  Administered 2016-07-11 – 2016-07-14 (×6): 1 via ORAL
  Filled 2016-07-11 (×6): qty 1

## 2016-07-11 MED ORDER — ACETAMINOPHEN 500 MG PO TABS
1000.0000 mg | ORAL_TABLET | Freq: Once | ORAL | Status: AC
  Administered 2016-07-11: 1000 mg via ORAL
  Filled 2016-07-11: qty 2

## 2016-07-11 MED ORDER — ACETAMINOPHEN 325 MG PO TABS: 650.0000 mg | ORAL_TABLET | Freq: Four times a day (QID) | ORAL | Status: DC | PRN

## 2016-07-11 MED ORDER — ACETAMINOPHEN 650 MG RE SUPP: 650.0000 mg | Freq: Four times a day (QID) | RECTAL | Status: DC | PRN

## 2016-07-11 MED ORDER — SODIUM CHLORIDE 0.9% FLUSH: 3.0000 mL | INTRAVENOUS | Status: DC | PRN

## 2016-07-11 MED ORDER — HEPARIN (PORCINE) IN NACL 100-0.45 UNIT/ML-% IJ SOLN
650.0000 [IU]/h | INTRAMUSCULAR | Status: DC
  Filled 2016-07-11: qty 250

## 2016-07-11 MED ORDER — ONDANSETRON HCL 4 MG/2ML IJ SOLN: 4.0000 mg | Freq: Four times a day (QID) | INTRAMUSCULAR | Status: DC | PRN

## 2016-07-11 MED ORDER — NICOTINE 21 MG/24HR TD PT24
21.0000 mg | MEDICATED_PATCH | Freq: Every day | TRANSDERMAL | Status: DC
Start: 2016-07-11 — End: 2016-07-14
  Administered 2016-07-14: 21 mg via TRANSDERMAL
  Filled 2016-07-11 (×2): qty 1

## 2016-07-11 MED ORDER — NITROGLYCERIN 5 MG/ML IV SOLN
INTRAVENOUS | Status: AC
  Filled 2016-07-11: qty 10

## 2016-07-11 MED ORDER — BIVALIRUDIN TRIFLUOROACETATE 250 MG IV SOLR
INTRAVENOUS | Status: AC | PRN
  Administered 2016-07-11: 1.75 mg/kg/h via INTRAVENOUS

## 2016-07-11 MED ORDER — SODIUM CHLORIDE 0.9 % IV SOLN: 250.0000 mL | INTRAVENOUS | Status: DC | PRN

## 2016-07-11 MED ORDER — DIPHENHYDRAMINE HCL 50 MG/ML IJ SOLN
25.0000 mg | Freq: Once | INTRAMUSCULAR | Status: AC
  Administered 2016-07-11: 25 mg via INTRAVENOUS
  Filled 2016-07-11: qty 1

## 2016-07-11 MED ORDER — BIVALIRUDIN TRIFLUOROACETATE 250 MG IV SOLR
INTRAVENOUS | Status: AC
  Filled 2016-07-11: qty 250

## 2016-07-11 MED ORDER — HEPARIN BOLUS VIA INFUSION
3200.0000 [IU] | Freq: Once | INTRAVENOUS | Status: DC
  Filled 2016-07-11: qty 3200

## 2016-07-11 MED ORDER — LABETALOL HCL 5 MG/ML IV SOLN: 10.0000 mg | INTRAVENOUS | Status: AC | PRN

## 2016-07-11 MED ORDER — SODIUM CHLORIDE 0.9 % IV BOLUS (SEPSIS)
1000.0000 mL | Freq: Once | INTRAVENOUS | Status: AC
  Administered 2016-07-11: 1000 mL via INTRAVENOUS

## 2016-07-11 MED ORDER — PROCHLORPERAZINE EDISYLATE 5 MG/ML IJ SOLN
10.0000 mg | Freq: Once | INTRAMUSCULAR | Status: AC
  Administered 2016-07-11: 10 mg via INTRAVENOUS
  Filled 2016-07-11: qty 2

## 2016-07-11 MED ORDER — ONDANSETRON HCL 4 MG PO TABS: 4.0000 mg | ORAL_TABLET | Freq: Four times a day (QID) | ORAL | Status: DC | PRN

## 2016-07-11 MED ORDER — ATORVASTATIN CALCIUM 20 MG PO TABS
80.0000 mg | ORAL_TABLET | Freq: Every day | ORAL | Status: DC
  Administered 2016-07-12 – 2016-07-13 (×2): 80 mg via ORAL
  Filled 2016-07-11 (×2): qty 4

## 2016-07-11 MED ORDER — DEXAMETHASONE 4 MG PO TABS
4.0000 mg | ORAL_TABLET | Freq: Two times a day (BID) | ORAL | Status: DC
  Administered 2016-07-11 – 2016-07-14 (×6): 4 mg via ORAL
  Filled 2016-07-11 (×7): qty 1

## 2016-07-11 MED ORDER — CLOPIDOGREL BISULFATE 75 MG PO TABS
75.0000 mg | ORAL_TABLET | Freq: Every day | ORAL | Status: DC
  Administered 2016-07-12 – 2016-07-14 (×3): 75 mg via ORAL
  Filled 2016-07-11 (×3): qty 1

## 2016-07-11 MED ORDER — ENOXAPARIN SODIUM 40 MG/0.4ML ~~LOC~~ SOLN: 40.0000 mg | SUBCUTANEOUS | Status: DC

## 2016-07-11 MED ORDER — ASPIRIN 81 MG PO CHEW
324.0000 mg | CHEWABLE_TABLET | Freq: Once | ORAL | Status: AC
  Administered 2016-07-11: 324 mg via ORAL
  Filled 2016-07-11: qty 4

## 2016-07-11 MED ORDER — PANTOPRAZOLE SODIUM 40 MG PO TBEC
40.0000 mg | DELAYED_RELEASE_TABLET | Freq: Every day | ORAL | Status: DC
  Administered 2016-07-12 – 2016-07-13 (×2): 40 mg via ORAL
  Filled 2016-07-11 (×2): qty 1

## 2016-07-11 MED ORDER — CLOPIDOGREL BISULFATE 75 MG PO TABS
ORAL_TABLET | ORAL | Status: AC
Start: 2016-07-11 — End: 2016-07-11
  Administered 2016-07-11: 600 mg
  Filled 2016-07-11: qty 8

## 2016-07-11 MED ORDER — LEVETIRACETAM 500 MG PO TABS
500.0000 mg | ORAL_TABLET | Freq: Two times a day (BID) | ORAL | Status: DC
  Administered 2016-07-11 – 2016-07-14 (×6): 500 mg via ORAL
  Filled 2016-07-11 (×7): qty 1

## 2016-07-11 MED ORDER — MAGNESIUM OXIDE 400 (241.3 MG) MG PO TABS
400.0000 mg | ORAL_TABLET | Freq: Every day | ORAL | Status: DC
  Administered 2016-07-12 – 2016-07-13 (×3): 400 mg via ORAL
  Filled 2016-07-11 (×3): qty 1

## 2016-07-11 MED ORDER — METOPROLOL TARTRATE 25 MG PO TABS
25.0000 mg | ORAL_TABLET | Freq: Three times a day (TID) | ORAL | Status: DC
  Administered 2016-07-11: 25 mg via ORAL
  Filled 2016-07-11: qty 1

## 2016-07-11 MED ORDER — CLONAZEPAM 0.5 MG PO TABS: 0.5000 mg | ORAL_TABLET | Freq: Three times a day (TID) | ORAL | Status: DC | PRN

## 2016-07-11 MED ORDER — HYDRALAZINE HCL 20 MG/ML IJ SOLN: 5.0000 mg | INTRAMUSCULAR | Status: AC | PRN

## 2016-07-11 MED ORDER — IOPAMIDOL (ISOVUE-300) INJECTION 61%
INTRAVENOUS | Status: DC | PRN
  Administered 2016-07-11: 225 mL via INTRA_ARTERIAL

## 2016-07-11 MED ORDER — ACETAMINOPHEN 325 MG PO TABS: 650.0000 mg | ORAL_TABLET | ORAL | Status: DC | PRN

## 2016-07-11 MED ORDER — MIDAZOLAM HCL 2 MG/2ML IJ SOLN
INTRAMUSCULAR | Status: AC
  Filled 2016-07-11: qty 2

## 2016-07-11 SURGICAL SUPPLY — 16 items
BALLN TREK RX 2.5X15 (BALLOONS) ×3
BALLN ~~LOC~~ TREK RX 3.25X12 (BALLOONS) ×3
BALLOON TREK RX 2.5X15 (BALLOONS) ×1 IMPLANT
BALLOON ~~LOC~~ TREK RX 3.25X12 (BALLOONS) ×1 IMPLANT
CATH INFINITI 5FR ANG PIGTAIL (CATHETERS) ×3 IMPLANT
CATH INFINITI 5FR JL4 (CATHETERS) ×3 IMPLANT
CATH VISTA GUIDE 6FR JR4 (CATHETERS) ×3 IMPLANT
DEVICE CLOSURE MYNXGRIP 6/7F (Vascular Products) ×3 IMPLANT
DEVICE INFLAT 30 PLUS (MISCELLANEOUS) ×3 IMPLANT
KIT MANI 3VAL PERCEP (MISCELLANEOUS) ×3 IMPLANT
NEEDLE PERC 18GX7CM (NEEDLE) ×3 IMPLANT
PACK CARDIAC CATH (CUSTOM PROCEDURE TRAY) ×3 IMPLANT
SHEATH AVANTI 6FR X 11CM (SHEATH) ×3 IMPLANT
STENT XIENCE ALPINE RX 2.75X38 (Permanent Stent) ×3 IMPLANT
WIRE EMERALD 3MM-J .035X150CM (WIRE) ×6 IMPLANT
WIRE G HI TQ BMW 190 (WIRE) ×3 IMPLANT

## 2016-07-11 NOTE — ED Notes (Signed)
Family stated that pt has trouble swallowing. Pt denied wanting to take tylenol with applesauce. Swallowed both pills well without difficulty.

## 2016-07-11 NOTE — Progress Notes (Signed)
ANTICOAGULATION CONSULT NOTE - Initial Consult  Pharmacy Consult for heparin dosing and monitoring  Indication: chest pain/ACS  Allergies  Allergen Reactions  . Fentanyl Palpitations and Other (See Comments)    "loopy and confused" "feels like heart is going to come out of my chest"    Patient Measurements: Height: 5\' 3"  (160 cm) Weight: 116 lb (52.6 kg) IBW/kg (Calculated) : 52.4 Heparin Dosing Weight: 52.6  Vital Signs: Temp: 97.9 F (36.6 C) (07/10 1238) Temp Source: Oral (07/10 1238) BP: 164/86 (07/10 1430) Pulse Rate: 76 (07/10 1430)  Labs:  Recent Labs  07/11/16 1340  HGB 15.1  HCT 44.4  PLT 136*  CREATININE 0.70  TROPONINI 16.20*    Estimated Creatinine Clearance: 56.4 mL/min (by C-G formula based on SCr of 0.7 mg/dL).   Medical History: Past Medical History:  Diagnosis Date  . Arthritis   . Brain metastasis ([place]) 07/06/2016  . Breast cancer ([place])    right  . Cancer ([place]) 2015   breast and bone  . COPD (chronic obstructive pulmonary disease) ([place])   . Depression   . GERD (gastroesophageal reflux disease)   . IBS (irritable bowel syndrome)   . 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 Pharmacy consulted for heparin dosing and monitoring.   Goal of Therapy:  Heparin level 0.3-0.7 units/ml Monitor platelets by anticoagulation protocol: Yes   Plan:  Baseline labs ordered.  Give 3200 units bolus x 1 Start heparin infusion at 650 units/hr Check anti-Xa level in 6 hours and daily while on heparin Continue to monitor H&H and platelets  Pernell Dupre, PharmD, BCPS Clinical Pharmacist 07/11/2016 3:12 PM

## 2016-07-11 NOTE — ED Provider Notes (Signed)
[facility] Emergency Department Provider Note    First MD Initiated Contact with Patient 07/11/16 1250     (approximate)  I have reviewed the triage vital signs and the nursing notes.   HISTORY  Chief Complaint Headache    HPI Elizabeth Bond is a 66 y.o. female I recently saw 2 weeks ago with new diagnosis of metastatic brain cancer presents today with 24 hours of primarily complaint of headache.  Vision is still been on steroids. Did recently start radiation therapy. Family reports that she was complaining of chest pain yesterday. Patient denies any new numbness or tingling. States "I feel alright."    Past Medical History:  Diagnosis Date  . Arthritis   . Brain metastasis ([place]) 07/06/2016  . Breast cancer ([place])    right  . Cancer ([place]) 2015   breast and bone  . COPD (chronic obstructive pulmonary disease) ([place])   . Depression   . GERD (gastroesophageal reflux disease)   . IBS (irritable bowel syndrome)   . 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 Cancer Father        prostate  . Cancer Mother        Pancreatic  . Cancer Maternal Aunt        breast  . Cancer Cousin        breast  . Cancer Other        lung cancer - neice  . Cancer - Other Daughter        brain   Past Surgical History:  Procedure Laterality Date  . ABDOMINAL HYSTERECTOMY  1978  . CATARACT EXTRACTION W/PHACO Right 06/06/2016   Procedure: CATARACT EXTRACTION PHACO AND INTRAOCULAR LENS PLACEMENT (IOC);  Surgeon: Birder Robson, MD;  Location: [facility] ORS;  Service: Ophthalmology;  Laterality: Right;  [place] 00:51.1 AP% 14.0 CDE 7.16 Fluid pack lot # [id number]  . CATARACT EXTRACTION W/PHACO Left 06/27/2016   Procedure: CATARACT EXTRACTION PHACO AND INTRAOCULAR LENS PLACEMENT (IOC);  Surgeon: Birder Robson, MD;  Location: [facility] ORS;  Service: Ophthalmology;  Laterality: Left;  [place] 00:37 AP% 19.1 CDE  7.11 Fluid pack lot # [id number]    . FLEXIBLE BRONCHOSCOPY N/A 05/31/2016   Procedure: FLEXIBLE BRONCHOSCOPY;  Surgeon: Wilhelmina Mcardle, MD;  Location: [facility] ORS;  Service: Pulmonary;  Laterality: N/A;  . JOINT REPLACEMENT    . PORTACATH PLACEMENT  2015  . REPLACEMENT TOTAL KNEE Left 2004-2005?   Patient Active Problem List   Diagnosis Date Noted  . Brain metastasis ([place]) 07/06/2016  . Seizures ([place]) 06/28/2016  . Pulmonary nodules 05/25/2016  . Hypomagnesemia 02/26/2016  . Hypocalcemia 02/26/2016  . Elevated bilirubin 02/26/2016  . Right shoulder pain 02/24/2016  . Hypokalemia 10/07/2015  . Encounter for antineoplastic chemotherapy 09/16/2015  . Sepsis ([place]) 12/23/2014  . 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 Breast cancer ([place]) 06/16/2013      Prior to Admission medications   Medication Sig Start Date End Date Taking? Authorizing Provider  acetaminophen (TYLENOL) 325 MG tablet Take 650 mg by mouth daily as needed for headache.   Yes [provider]  Calcium Carb-Cholecalciferol (CALCIUM-VITAMIN D3) 600-400 MG-UNIT TABS Take 1 tablet by mouth 2 (two) times daily.   Yes [provider]  clonazePAM (KLONOPIN) 0.5 MG tablet TAKE 1 TABLET 3 TIMES DAILY AS NEEDED FOR ANXIETY 07/10/16  Yes Corcoran, Melissa C, MD  dexamethasone (DECADRON) 4 MG tablet Take 1 tablet (4 mg  total) by mouth 2 (two) times daily with a meal. 06/29/16  Yes Mody, Sital, MD  levETIRAcetam (KEPPRA) 500 MG tablet Take 1 tablet (500 mg total) by mouth 2 (two) times daily. 06/29/16  Yes Mody, Ulice Bold, MD  loratadine (CLARITIN) 10 MG tablet Take 10 mg by mouth daily as needed for allergies.    Yes [provider]  Magnesium 500 MG TABS Take 500 mg by mouth daily.    Yes [provider]  pantoprazole (PROTONIX) 40 MG tablet Take 1 tablet (40 mg total) by mouth daily. 03/23/16  Yes Lequita Asal, MD  phosphorus (K PHOS NEUTRAL) 155-852-130 MG tablet Take 1 tablet (250 mg total) by mouth 2 (two) times daily. 06/23/16   Yes Corcoran, Drue Second, MD  potassium chloride (K-DUR) 10 MEQ tablet TAKE ONE TABLET BY MOUTH EVERY DAY 06/23/16  Yes Corcoran, Melissa C, MD  temazepam (RESTORIL) 15 MG capsule TAKE 1 CAPSULE AT BEDTIME AS NEEDED FOR SLEEP 06/05/16  Yes Lequita Asal, MD    Allergies Fentanyl    Social History Social History  Substance Use Topics  . Smoking status: Current Every Day Smoker    Packs/day: 0.25    Years: 40.00    Types: Cigarettes  . Smokeless tobacco: Never Used  . Alcohol use No    Review of Systems Patient denies headaches, rhinorrhea, blurry vision, numbness, shortness of breath, chest pain, edema, cough, abdominal pain, nausea, vomiting, diarrhea, dysuria, fevers, rashes or hallucinations unless otherwise stated above in HPI. ____________________________________________   PHYSICAL EXAM:  VITAL SIGNS: Vitals:   07/11/16 1330 07/11/16 1430  BP: (!) 186/90 (!) 164/86  Pulse: 77 76  Resp: (!) 27 (!) 9  Temp:      Constitutional: Alert chronically ill appearing but in no acute distress. Eyes: Conjunctivae are normal.  Head: Atraumatic. Nose: No congestion/rhinnorhea. Mouth/Throat: Mucous membranes are moist.   Neck: No stridor. Painless ROM.  Cardiovascular: Normal rate, regular rhythm. Grossly normal heart sounds.  Good peripheral circulation. Respiratory: Normal respiratory effort.  No retractions. Lungs CTAB. Gastrointestinal: Soft and nontender. No distention. No abdominal bruits. No CVA tenderness. Musculoskeletal: No lower extremity tenderness nor edema.  No joint effusions. Neurologic:  Normal speech and language. No gross focal neurologic deficits are appreciated. No facial droop Skin:  Skin is warm, dry and intact. No rash noted. Psychiatric: Mood and affect are normal. Speech and behavior are normal. ____________________________________________   LABS (all labs ordered are listed, but only abnormal results are displayed)  Results for orders placed or  performed during the hospital encounter of 07/11/16 (from the past 24 hour(s))  CBC with Differential/Platelet     Status: Abnormal   Collection Time: 07/11/16  1:40 PM  Result Value Ref Range   WBC 11.7 (H) 3.6 - 11.0 K/uL   RBC 4.54 3.80 - 5.20 MIL/uL   Hemoglobin 15.1 12.0 - 16.0 g/dL   HCT 44.4 35.0 - 47.0 %   MCV 97.8 80.0 - 100.0 fL   MCH 33.4 26.0 - 34.0 pg   MCHC 34.1 32.0 - 36.0 g/dL   RDW 16.9 (H) 11.5 - 14.5 %   Platelets 136 (L) 150 - 440 K/uL   Neutrophils Relative % 92 %   Neutro Abs 10.8 (H) 1.4 - 6.5 K/uL   Lymphocytes Relative 3 %   Lymphs Abs 0.3 (L) 1.0 - 3.6 K/uL   Monocytes Relative 5 %   Monocytes Absolute 0.6 0.2 - 0.9 K/uL   Eosinophils Relative 0 %  Eosinophils Absolute 0.0 0 - 0.7 K/uL   Basophils Relative 0 %   Basophils Absolute 0.0 0 - 0.1 K/uL  Basic metabolic panel     Status: Abnormal   Collection Time: 07/11/16  1:40 PM  Result Value Ref Range   Sodium 140 135 - 145 mmol/L   Potassium 4.9 3.5 - 5.1 mmol/L   Chloride 104 101 - 111 mmol/L   CO2 28 22 - 32 mmol/L   Glucose, Bld 113 (H) 65 - 99 mg/dL   BUN 43 (H) 6 - 20 mg/dL   Creatinine, Ser 0.70 0.44 - 1.00 mg/dL   Calcium 9.6 8.9 - 10.3 mg/dL   GFR calc non Af Amer >60 >60 mL/min   GFR calc Af Amer >60 >60 mL/min   Anion gap 8 5 - 15  Troponin I     Status: Abnormal   Collection Time: 07/11/16  1:40 PM  Result Value Ref Range   Troponin I 16.20 (HH) <0.03 ng/mL   ____________________________________________  EKG My review and personal interpretation at Time: 14:09   Indication: chest pain  Rate: 80  Rhythm: sinus Axis: left Other: inferior STEMI, reciprocal changes,  ____________________________________________  RADIOLOGY  I personally reviewed all radiographic images ordered to evaluate for the above acute complaints and reviewed radiology reports and findings.  These findings were personally discussed with the patient.  Please see medical record for radiology  report.  ____________________________________________   PROCEDURES  Procedure(s) performed:  Procedures    Critical Care performed: yes CRITICAL CARE Performed by: Merlyn Lot   Total critical care time: 40 minutes  Critical care time was exclusive of separately billable procedures and treating other patients.  Critical care was necessary to treat or prevent imminent or life-threatening deterioration.  Critical care was time spent personally by me on the following activities: development of treatment plan with patient and/or surrogate as well as nursing, discussions with consultants, evaluation of patient's response to treatment, examination of patient, obtaining history from patient or surrogate, ordering and performing treatments and interventions, ordering and review of laboratory studies, ordering and review of radiographic studies, pulse oximetry and re-evaluation of patient's condition.  ____________________________________________   INITIAL IMPRESSION / ASSESSMENT AND PLAN / ED COURSE  Pertinent labs & imaging results that were available during my care of the patient were reviewed by me and considered in my medical decision making (see chart for details).  DDX: ich, mass, edema, electrolyte abn, dehydration  Elizabeth Bond is a 66 y.o. who presents to the ED with complex recent past medical history as described above. Patient appears well and in no acute distress. Patient did have report of chest pain yesterday but denies any pain right now. States that she does have a headache. No nausea or vomiting.  CT head emergently ordered to evaluate for evidence of head bleed. CT imaging with no acute changes. The patient will be placed on continuous pulse oximetry and telemetry for monitoring.  Laboratory evaluation will be sent to evaluate for the above complaints.     Clinical Course as of Jul 12 1502  Tue Jul 11, 2016  1419 [facility] shows evidence of interior ST elevations.   Code STEMI called  [PR]    Clinical Course User Index [PR] Merlyn Lot, MD   ----------------------------------------- 3:04 PM on 07/11/2016 -----------------------------------------  Cardiology at bedside and after lengthy discussion with patient and family regarding the risks and benefits of intervention and they have agreed to proceed to the Cath Lab. Troponin  elevated to 16. Patient given aspirin.  Have discussed with the patient and available family all diagnostics and treatments performed thus far and all questions were answered to the best of my ability. The patient demonstrates understanding and agreement with plan.   ____________________________________________   FINAL CLINICAL IMPRESSION(S) / ED DIAGNOSES  Final diagnoses:  ST elevation myocardial infarction (STEMI), unspecified artery (HCC)  Bad headache  Metastatic breast cancer ([place])      NEW MEDICATIONS STARTED DURING THIS VISIT:  New Prescriptions   No medications on file     Note:  This document was prepared using Dragon voice recognition software and may include unintentional dictation errors.    Merlyn Lot, MD 07/11/16 [id number]

## 2016-07-11 NOTE — Progress Notes (Signed)
Name: Elizabeth Bond MRN: [id number] DOB: [birth year]    ADMISSION DATE:  07/11/2016  REFERRING MD :  Dr. Tressia Miners  CHIEF COMPLAINT:  Headache  BRIEF PATIENT DESCRIPTION: 66 year old female with multiple co-morbidities, now presenting with Inferior Wall MI  SIGNIFICANT EVENTS  7/10 >> Patient admitted to the ICU with Inferior wall MI S/P DES  STUDIES:  7/10 EF -45% Based on cath on 7/10 7/10 CT head>>Grossly stable known intracranial metastases with some improvementin the diffuse vasogenic edema pattern throughout the cerebralhemispheres.  HISTORY OF PRESENT ILLNESS:  Elizabeth Bond is 66 year old female with multiple co-morbidities which includes breast cancer with mets to brain and bones,COPD,Tobacco Abuse,GERD,Depression and arthritis. Patient presented to ED with complaints of headache.  Patient is currently getting radiation for her Ca of brain.  CT of the head was showing stable intracranial metastatic Ca with improvement in the vasogenic edema. However her EKG was concerning for Inferior Wall MI.  Therefore patient was immediately taken to the cath lab.  Post procedure was sent to the ICU for observation PAST MEDICAL HISTORY :   has a past medical history of Arthritis; Brain metastasis ([place]) (07/06/2016); Breast cancer ([place]); Cancer ([place]) (2015); COPD (chronic obstructive pulmonary disease) ([place]); Depression; GERD (gastroesophageal reflux disease); IBS (irritable bowel syndrome); and Metastasis to bone ([place]) (09/01/2014).  has a past surgical history that includes Replacement total knee (Left, 2004-2005?); Abdominal hysterectomy (1978); Portacath placement (2015); Flexible bronchoscopy (N/A, 05/31/2016); Joint replacement; Cataract extraction w/PHACO (Right, 06/06/2016); and Cataract extraction w/PHACO (Left, 06/27/2016). Prior to Admission medications   Medication Sig Start Date End Date Taking? Authorizing Provider  acetaminophen (TYLENOL) 325 MG tablet Take 650 mg by mouth daily as needed for  headache.   Yes [provider]  Calcium Carb-Cholecalciferol (CALCIUM-VITAMIN D3) 600-400 MG-UNIT TABS Take 1 tablet by mouth 2 (two) times daily.   Yes [provider]  clonazePAM (KLONOPIN) 0.5 MG tablet TAKE 1 TABLET 3 TIMES DAILY AS NEEDED FOR ANXIETY 07/10/16  Yes Corcoran, Drue Second, MD  dexamethasone (DECADRON) 4 MG tablet Take 1 tablet (4 mg total) by mouth 2 (two) times daily with a meal. 06/29/16  Yes Mody, Sital, MD  levETIRAcetam (KEPPRA) 500 MG tablet Take 1 tablet (500 mg total) by mouth 2 (two) times daily. 06/29/16  Yes Mody, Ulice Bold, MD  loratadine (CLARITIN) 10 MG tablet Take 10 mg by mouth daily as needed for allergies.    Yes [provider]  Magnesium 500 MG TABS Take 500 mg by mouth daily.    Yes [provider]  pantoprazole (PROTONIX) 40 MG tablet Take 1 tablet (40 mg total) by mouth daily. 03/23/16  Yes Lequita Asal, MD  phosphorus (K PHOS NEUTRAL) 155-852-130 MG tablet Take 1 tablet (250 mg total) by mouth 2 (two) times daily. 06/23/16  Yes Corcoran, Drue Second, MD  potassium chloride (K-DUR) 10 MEQ tablet TAKE ONE TABLET BY MOUTH EVERY DAY 06/23/16  Yes Corcoran, Melissa C, MD  temazepam (RESTORIL) 15 MG capsule TAKE 1 CAPSULE AT BEDTIME AS NEEDED FOR SLEEP 06/05/16  Yes Lequita Asal, MD   Allergies  Allergen Reactions  . Fentanyl Palpitations and Other (See Comments)    "loopy and confused" "feels like heart is going to come out of my chest"    FAMILY HISTORY:  family history includes Cancer in her cousin, father, maternal aunt, mother, and other; Cancer - Other in her daughter. SOCIAL HISTORY:  reports that she has been smoking Cigarettes.  She has a 10.00 pack-year smoking history. She has never used smokeless tobacco. She reports that she does not drink alcohol or use drugs.  REVIEW OF SYSTEMS:   Constitutional: Negative for fever, chills, weight loss, malaise/fatigue and diaphoresis.  HENT: Negative for hearing loss, ear  pain, nosebleeds, congestion, sore throat, neck pain, tinnitus and ear discharge.   Eyes: Negative for blurred vision, double vision, photophobia, pain, discharge and redness.  Respiratory: Negative for cough, hemoptysis, sputum production, shortness of breath, wheezing and stridor.   Cardiovascular: Negative for chest pain, palpitations, orthopnea, claudication, leg swelling and PND.  Gastrointestinal: Negative for heartburn, nausea, vomiting, abdominal pain, diarrhea, constipation, blood in stool and melena.  Genitourinary: Negative for dysuria, urgency, frequency, hematuria and flank pain.  Musculoskeletal: Negative for myalgias, back pain, joint pain and falls.  Skin: Negative for itching and rash.  Neurological: Negative for dizziness, tingling, tremors, sensory change, speech change, focal weakness, seizures, loss of consciousness, weakness and headaches.  Endo/Heme/Allergies: Negative for environmental allergies and polydipsia. Does not bruise/bleed easily.  SUBJECTIVE: Patient is snoring , however does responds to verbal stimuli when prompted  VITAL SIGNS: Temp:  [97.1 F (36.2 C)-97.9 F (36.6 C)] 97.9 F (36.6 C) (07/10 2000) Pulse Rate:  [65-78] 65 (07/10 2000) Resp:  [9-27] 10 (07/10 2000) BP: (126-186)/(69-110) 136/69 (07/10 2000) SpO2:  [95 %-98 %] 97 % (07/10 2000) Weight:  [116 lb (52.6 kg)-126 lb 8.7 oz (57.4 kg)] 126 lb 8.7 oz (57.4 kg) (07/10 1627)  PHYSICAL EXAMINATION: General:  Cachectic female, found snoring, on RA, in no acute distress Neuro:  Responds when prompted to Verbal stimuli HEENT:  AT,[place],No JVD,PERRLA Cardiovascular:S1S2,Regular, no m/r/g Lungs:  Diminished air entry, no wheezes,crackles,rhonchi noted Abdomen:  Soft,NT,ND Musculoskeletal:  No edema,cyanosis Skin:  Warm,dry and Intact   Recent Labs Lab 07/11/16 1340  NA 140  K 4.9  CL 104  CO2 28  BUN 43*  CREATININE 0.70  GLUCOSE 113*    Recent Labs Lab 07/11/16 1340  HGB 15.1  HCT  44.4  WBC 11.7*  PLT 136*   Ct Head Wo Contrast  Result Date: 07/11/2016 CLINICAL DATA:  Worsening headaches, metastatic breast cancer, intracranial metastases EXAM: CT HEAD WITHOUT CONTRAST TECHNIQUE: Contiguous axial images were obtained from the base of the skull through the vertex without intravenous contrast. COMPARISON:  06/28/2016 FINDINGS: Brain: Grossly stable size of the known intracranial metastases within the right caudate, left frontal lobe, left parietooccipital lobes, and right temporal lobe. Similar to slight improvement in the asymmetric diffuse vasogenic edema pattern throughout both cerebral hemispheres, worse on the left. No hydrocephalus. No significant midline shift. No acute intracranial hemorrhage or extra-axial fluid collection. Basilar cisterns remain patent. No gross cerebellar abnormality. Vascular: No hyperdense vessel or unexpected calcification. Skull: Negative for fracture. No focal osseous abnormality. Right mastoid effusion noted. Sinuses/Orbits: No acute finding. Other: None. IMPRESSION: Grossly stable known intracranial metastases with some improvement in the diffuse vasogenic edema pattern throughout the cerebral hemispheres. No acute intracranial hemorrhage, hydrocephalus, or significant herniation. Electronically Signed   By: Jerilynn Mages.  Shick M.D.   On: 07/11/2016 13:47   Dg Chest Portable 1 View  Result Date: 07/11/2016 CLINICAL DATA:  Chest pain.  Concern for pneumonia EXAM: PORTABLE CHEST 1 VIEW COMPARISON:  05/31/2016 FINDINGS: Lower lung volumes with interstitial crowding. No asymmetric opacity to suggest pneumonia. No edema, effusion, or pneumothorax. Chronic cardiomegaly and aortic tortuosity. Left subclavian porta catheter with tip at the lower SVC. IMPRESSION: 1. No new or focal opacity  to suggest pneumonia. 2. Numerous pulmonary nodules on 05/19/2016 PET-CT that were radiographically occult on subsequent chest x-ray. Electronically Signed   By: Monte Fantasia M.D.    On: 07/11/2016 14:07    ASSESSMENT / PLAN:  STEMI Inferior wall s/p DES Metastatic breast cancer with mets to brain Tobacco Abuse Seizures Anxiety GERD  Plan Cardiology following,  Continue Aspirin/plavix Continue Lisinopril/metoprolol Trend troponins Obtain EKG Post procedure Oncology following the patient Continue Keppra Continue Protonix Continue Nicotine Continue Clonazepam   Bincy Varughese,AG-ACNP Pulmonary and [place]   07/11/2016, 8:39 PM

## 2016-07-11 NOTE — ED Triage Notes (Signed)
Headache really bad through tht night.  Has had for 2 days. Has brain cancer and the dr there told her to come to ED.

## 2016-07-11 NOTE — ED Notes (Signed)
Pt taken to CT.   Wants port on L chest accessed. Will access when pt returns from CT

## 2016-07-11 NOTE — Progress Notes (Signed)
CH received a PG for a Code Stemi. [place] arrived at room to check on PT and staff was actively moving PT to Cath lab. [place] found family in Cath waiting area. CH offered support for family and set with until Dr came with results. [place] will meet family in ICU waiting.     07/11/16 1425  Clinical Encounter Type  Visited With Patient and family together  Visit Type Code  Referral From Nurse  Consult/Referral To [place] (Comment)

## 2016-07-11 NOTE — Telephone Encounter (Signed)
Daughter called asking that we see her mother today. States she is complaining of severe pain in her head and cannot sleep or do anything else but to "moan and holler".  I discussed with Dr Mike Gip who states she needs to go to ER. I called Crystal back and she states she will take her to ER as instructed

## 2016-07-11 NOTE — H&P (Signed)
[facility] [place] NAME: Elizabeth Bond    MR#:  [id number]  DATE OF BIRTH:  29-Jul-1950  DATE OF ADMISSION:  07/11/2016  PRIMARY CARE PHYSICIAN: Remi Haggard, FNP   REQUESTING/REFERRING PHYSICIAN: Dr. Lujean Amel  CHIEF COMPLAINT:   Chief Complaint  Patient presents with  . Headache    HISTORY OF PRESENT ILLNESS:  Elizabeth Bond  is a 66 y.o. 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 Patient is a very poor historian. Most of the history is obtained from her daughter at bedside. Her mentation and cognitive function have been slowly declining since her brain metastases were diagnosed 2 weeks ago. She is being started on whole brain radiation last few days. Patient was complaining of headache since last night. Her blood pressure has been fluctuating. She denies any chest pain now and the daughter states that she never complained of any chest pain. Since her headache was not improving, they called her oncology office who have recommended her to come to the emergency room. CT of the head showing stable intracranial metastatic with improvement in the vasogenic edema. EKG done in the ER showed acute inferior STEMI. Patient was taken to cardiac cath lab, and received a drug-eluting stent. Vitals are stable at this time.  PAST MEDICAL HISTORY:   Past Medical History:  Diagnosis Date  . Arthritis   . Brain metastasis ([place]) 07/06/2016  . Breast cancer ([place])    right  . Cancer ([place]) 2015   breast and bone  . COPD (chronic obstructive pulmonary disease) ([place])   . Depression   . GERD (gastroesophageal reflux disease)   . IBS (irritable bowel syndrome)   . Metastasis to bone ([place])  09/01/2014    PAST SURGICAL HISTORY:   Past Surgical History:  Procedure Laterality Date  . ABDOMINAL HYSTERECTOMY  1978  . CATARACT EXTRACTION W/PHACO Right 06/06/2016   Procedure: CATARACT EXTRACTION PHACO AND INTRAOCULAR LENS PLACEMENT (IOC);  Surgeon: Birder Robson, MD;  Location: [facility] ORS;  Service: Ophthalmology;  Laterality: Right;  [place] 00:51.1 AP% 14.0 CDE 7.16 Fluid pack lot # [id number]  . CATARACT EXTRACTION W/PHACO Left 06/27/2016   Procedure: CATARACT EXTRACTION PHACO AND INTRAOCULAR LENS PLACEMENT (IOC);  Surgeon: Birder Robson, MD;  Location: [facility] ORS;  Service: Ophthalmology;  Laterality: Left;  [place] 00:37 AP% 19.1 CDE  7.11 Fluid pack lot # [id number]  . FLEXIBLE BRONCHOSCOPY N/A 05/31/2016   Procedure: FLEXIBLE BRONCHOSCOPY;  Surgeon: Wilhelmina Mcardle, MD;  Location: [facility] ORS;  Service: Pulmonary;  Laterality: N/A;  . JOINT REPLACEMENT    . PORTACATH PLACEMENT  2015  . REPLACEMENT TOTAL KNEE Left 2004-2005?    SOCIAL HISTORY:   Social History  Substance Use Topics  . Smoking status: Current Every Day Smoker    Packs/day: 0.25    Years: 40.00    Types: Cigarettes  . Smokeless tobacco: Never Used  . Alcohol use No    FAMILY HISTORY:   Family History  Problem Relation Age of Onset  . Cancer Father        prostate  . Cancer Mother        Pancreatic  . Cancer Maternal Aunt        breast  . Cancer  Cousin        breast  . Cancer Other        lung cancer - neice  . Cancer - Other Daughter        brain    DRUG ALLERGIES:   Allergies  Allergen Reactions  . Fentanyl Palpitations and Other (See Comments)    "loopy and confused" "feels like heart is going to come out of my chest"    REVIEW OF SYSTEMS:   Review of Systems  Constitutional: Positive for malaise/fatigue and weight loss. Negative for chills and fever.  HENT: Negative for ear discharge, ear pain, hearing loss and nosebleeds.   Eyes: Negative for blurred vision, double vision and  photophobia.  Respiratory: Negative for cough, hemoptysis, shortness of breath and wheezing.   Cardiovascular: Negative for chest pain, palpitations, orthopnea and leg swelling.  Gastrointestinal: Positive for nausea. Negative for abdominal pain, constipation, diarrhea, heartburn, melena and vomiting.  Genitourinary: Negative for dysuria, frequency, hematuria and urgency.  Musculoskeletal: Positive for myalgias. Negative for back pain and neck pain.  Skin: Negative for rash.  Neurological: Positive for headaches. Negative for dizziness, tingling, tremors, sensory change, speech change and focal weakness.  Endo/Heme/Allergies: Does not bruise/bleed easily.  Psychiatric/Behavioral: Negative for depression.    MEDICATIONS AT HOME:   Prior to Admission medications   Medication Sig Start Date End Date Taking? Authorizing Provider  acetaminophen (TYLENOL) 325 MG tablet Take 650 mg by mouth daily as needed for headache.   Yes [provider]  Calcium Carb-Cholecalciferol (CALCIUM-VITAMIN D3) 600-400 MG-UNIT TABS Take 1 tablet by mouth 2 (two) times daily.   Yes [provider]  clonazePAM (KLONOPIN) 0.5 MG tablet TAKE 1 TABLET 3 TIMES DAILY AS NEEDED FOR ANXIETY 07/10/16  Yes Corcoran, Drue Second, MD  dexamethasone (DECADRON) 4 MG tablet Take 1 tablet (4 mg total) by mouth 2 (two) times daily with a meal. 06/29/16  Yes Mody, Sital, MD  levETIRAcetam (KEPPRA) 500 MG tablet Take 1 tablet (500 mg total) by mouth 2 (two) times daily. 06/29/16  Yes Mody, Ulice Bold, MD  loratadine (CLARITIN) 10 MG tablet Take 10 mg by mouth daily as needed for allergies.    Yes [provider]  Magnesium 500 MG TABS Take 500 mg by mouth daily.    Yes [provider]  pantoprazole (PROTONIX) 40 MG tablet Take 1 tablet (40 mg total) by mouth daily. 03/23/16  Yes Lequita Asal, MD  phosphorus (K PHOS NEUTRAL) 155-852-130 MG tablet Take 1 tablet (250 mg total) by mouth 2 (two) times daily.  06/23/16  Yes Corcoran, Drue Second, MD  potassium chloride (K-DUR) 10 MEQ tablet TAKE ONE TABLET BY MOUTH EVERY DAY 06/23/16  Yes Corcoran, Melissa C, MD  temazepam (RESTORIL) 15 MG capsule TAKE 1 CAPSULE AT BEDTIME AS NEEDED FOR SLEEP 06/05/16  Yes Corcoran, Melissa C, MD      VITAL SIGNS:  Blood pressure (!) 164/76, pulse 76, temperature (!) 97.1 F (36.2 C), temperature source Oral, resp. rate (!) 9, height _0  (1.626 m), weight 57.4 kg (126 lb 8.7 oz), SpO2 96 %.  PHYSICAL EXAMINATION:   Physical Exam  GENERAL:  66 y.o.-year-old patient lying in the bed with no acute distress.  EYES: Pupils equal, round, reactive to light and accommodation. No scleral icterus. Extraocular muscles intact.  HEENT: Head atraumatic, normocephalic. Oropharynx and nasopharynx clear.  NECK:  Supple, no jugular venous distention. No thyroid enlargement, no tenderness.  LUNGS: Normal breath sounds bilaterally, no wheezing, rales,rhonchi  or crepitation. No use of accessory muscles of respiration. Decreased bibasilar breath sounds CARDIOVASCULAR: S1, S2 normal. No rubs, or gallops. 2/6 systolic murmur present ABDOMEN: Soft, nontender, nondistended. Bowel sounds present. No organomegaly or mass.  EXTREMITIES: No pedal edema, cyanosis, or clubbing. Right groin dressing in place, no erythema or swelling or bleeding noted NEUROLOGIC: Cranial nerves II through XII are intact. Muscle strength 5/5 in all extremities. Sensation intact. Gait not checked. No focal deficits PSYCHIATRIC: The patient is sleepy, easily arousable. Oriented x 2-3.  SKIN: No obvious rash, lesion, or ulcer.   LABORATORY PANEL:   CBC  Recent Labs Lab 07/11/16 1340  WBC 11.7*  HGB 15.1  HCT 44.4  PLT 136*   ------------------------------------------------------------------------------------------------------------------  Chemistries   Recent Labs Lab 07/11/16 1340  NA 140  K 4.9  CL 104  CO2 28  GLUCOSE 113*  BUN 43*  CREATININE  0.70  CALCIUM 9.6   ------------------------------------------------------------------------------------------------------------------  Cardiac Enzymes  Recent Labs Lab 07/11/16 1340  TROPONINI 16.20*   ------------------------------------------------------------------------------------------------------------------  RADIOLOGY:  Ct Head Wo Contrast  Result Date: 07/11/2016 CLINICAL DATA:  Worsening headaches, metastatic breast cancer, intracranial metastases EXAM: CT HEAD WITHOUT CONTRAST TECHNIQUE: Contiguous axial images were obtained from the base of the skull through the vertex without intravenous contrast. COMPARISON:  06/28/2016 FINDINGS: Brain: Grossly stable size of the known intracranial metastases within the right caudate, left frontal lobe, left parietooccipital lobes, and right temporal lobe. Similar to slight improvement in the asymmetric diffuse vasogenic edema pattern throughout both cerebral hemispheres, worse on the left. No hydrocephalus. No significant midline shift. No acute intracranial hemorrhage or extra-axial fluid collection. Basilar cisterns remain patent. No gross cerebellar abnormality. Vascular: No hyperdense vessel or unexpected calcification. Skull: Negative for fracture. No focal osseous abnormality. Right mastoid effusion noted. Sinuses/Orbits: No acute finding. Other: None. IMPRESSION: Grossly stable known intracranial metastases with some improvement in the diffuse vasogenic edema pattern throughout the cerebral hemispheres. No acute intracranial hemorrhage, hydrocephalus, or significant herniation. Electronically Signed   By: Jerilynn Mages.  Shick M.D.   On: 07/11/2016 13:47   Dg Chest Portable 1 View  Result Date: 07/11/2016 CLINICAL DATA:  Chest pain.  Concern for pneumonia EXAM: PORTABLE CHEST 1 VIEW COMPARISON:  05/31/2016 FINDINGS: Lower lung volumes with interstitial crowding. No asymmetric opacity to suggest pneumonia. No edema, effusion, or pneumothorax. Chronic  cardiomegaly and aortic tortuosity. Left subclavian porta catheter with tip at the lower SVC. IMPRESSION: 1. No new or focal opacity to suggest pneumonia. 2. Numerous pulmonary nodules on 05/19/2016 PET-CT that were radiographically occult on subsequent chest x-ray. Electronically Signed   By: Monte Fantasia M.D.   On: 07/11/2016 14:07    EKG:   Orders placed or performed during the hospital encounter of 07/11/16  . ED EKG  . ED EKG  . EKG 12-Lead  . EKG 12-Lead  . EKG 12-Lead immediately post procedure  . EKG 12-Lead  . EKG 12-Lead immediately post procedure    IMPRESSION AND PLAN:   Zunairah Devers  is a 66 y.o. 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  #1 Inferior STEMI-appreciate cards consult - s/p DES, started on asa, plavix, metoprolol, lisinopril - EF 45%  Based on cath -Monitor in ICU today. If stable can be transferred to telemetry  tomorrow.  #2 metastatic breast cancer-recent diagnosis of brain medicines since 2 weeks ago. Continue Keppra. No further seizures at home. Continue whole brain radiation. Also on Decadron. Follow-up with oncology  #3 anxiety-continue home medications.  #4 GERD-Protonix  #5 DVT prophylaxis-currently on heparin IV, change to Lovenox  All the records are reviewed and case discussed with ED provider. Management plans discussed with the patient, family and they are in agreement.  CODE STATUS: Full Code  TOTAL TIME TAKING CARE OF THIS PATIENT: 50 minutes.    Gladstone Lighter M.D on 07/11/2016 at 5:02 PM  Between 7am to 6pm - Pager - [phone]  After 6pm go to [email or web address] - password [email or web address] [facility]  Office  [phone]  CC: Primary care physician;  Remi Haggard, FNP

## 2016-07-12 ENCOUNTER — Encounter: Payer: Self-pay | Admitting: Internal Medicine

## 2016-07-12 ENCOUNTER — Ambulatory Visit: Payer: Medicare Other

## 2016-07-12 ENCOUNTER — Inpatient Hospital Stay: Payer: Medicare Other

## 2016-07-12 LAB — BASIC METABOLIC PANEL
ANION GAP: 5 (ref 5–15)
BUN: 31 mg/dL — AB (ref 6–20)
CALCIUM: 8.2 mg/dL — AB (ref 8.9–10.3)
CO2: 27 mmol/L (ref 22–32)
Chloride: 107 mmol/L (ref 101–111)
Creatinine, Ser: 0.58 mg/dL (ref 0.44–1.00)
GFR calc Af Amer: >60 mL/min (ref >60)
GFR calc non Af Amer: >60 mL/min (ref >60)
GLUCOSE: 89 mg/dL (ref 65–99)
Potassium: 4.4 mmol/L (ref 3.5–5.1)
Sodium: 139 mmol/L (ref 135–145)

## 2016-07-12 LAB — CARDIAC CATHETERIZATION: Cath EF Quantitative: 50 %

## 2016-07-12 LAB — CBC
HEMATOCRIT: 39.7 % (ref 35.0–47.0)
HEMOGLOBIN: 13.6 g/dL (ref 12.0–16.0)
MCH: 33.5 pg (ref 26.0–34.0)
MCHC: 34.3 g/dL (ref 32.0–36.0)
MCV: 97.7 fL (ref 80.0–100.0)
Platelets: 112 10*3/uL — ABNORMAL LOW (ref 150–440)
RBC: 4.06 MIL/uL (ref 3.80–5.20)
RDW: 17 % — ABNORMAL HIGH (ref 11.5–14.5)
WBC: 6.1 10*3/uL (ref 3.6–11.0)

## 2016-07-12 LAB — TROPONIN I: Troponin I: >65 ng/mL (ref <0.03)

## 2016-07-12 MED ORDER — SODIUM CHLORIDE 0.9 % IV BOLUS (SEPSIS): 500.0000 mL | Freq: Once | INTRAVENOUS | Status: DC

## 2016-07-12 MED ORDER — SODIUM CHLORIDE 0.9 % IV SOLN
INTRAVENOUS | Status: DC
  Administered 2016-07-12: 23:00:00 via INTRAVENOUS

## 2016-07-12 MED ORDER — SODIUM CHLORIDE 0.9 % IV BOLUS (SEPSIS)
500.0000 mL | Freq: Once | INTRAVENOUS | Status: AC
  Administered 2016-07-12: 500 mL via INTRAVENOUS

## 2016-07-12 NOTE — Progress Notes (Signed)
Subjective:  Pt feel ok denies cp or sob. Still feels weak. Sitting up in bed eating, Bp low today.  Objective:  Vital Signs in the last 24 hours: Temp:  [97.3 F (36.3 C)-97.9 F (36.6 C)] 97.4 F (36.3 C) (07/11 1200) Pulse Rate:  [59-81] 78 (07/11 1500) Resp:  [10-23] 19 (07/11 1500) BP: (51-144)/(24-85) 110/72 (07/11 1500) SpO2:  [95 %-100 %] 100 % (07/11 1500)  Intake/Output from previous day: 07/10 0701 - 07/11 0700 In: 1077.9 [I.V.:1077.9] Out: 1250 [Urine:1250] Intake/Output from this shift: No intake/output data recorded.  Physical Exam: General appearance: appears older than stated age Neck: no adenopathy, no carotid bruit, no JVD, supple, symmetrical, trachea midline and thyroid not enlarged, symmetric, no tenderness/mass/nodules Lungs: diminished breath sounds bilaterally Heart: regular rate and rhythm, S1, S2 normal, no murmur, click, rub or gallop Abdomen: soft, non-tender; bowel sounds normal; no masses,  no organomegaly Extremities: extremities normal, atraumatic, no cyanosis or edema Pulses: 2+ and symmetric Skin: Skin color, texture, turgor normal. No rashes or lesions Neurologic: Alert and oriented X 3, normal strength and tone. Normal symmetric reflexes. Normal coordination and gait  Lab Results:  Recent Labs  07/11/16 1340 07/12/16 0415  WBC 11.7* 6.1  HGB 15.1 13.6  PLT 136* 112*    Recent Labs  07/11/16 1340 07/12/16 0415  NA 140 139  K 4.9 4.4  CL 104 107  CO2 28 27  GLUCOSE 113* 89  BUN 43* 31*  CREATININE 0.70 0.58    Recent Labs  07/11/16 2222 07/12/16 0415  TROPONINI >65.00* >65.00*   Hepatic Function Panel No results for input(s): PROT, ALBUMIN, AST, ALT, ALKPHOS, BILITOT, BILIDIR, IBILI in the last 72 hours. No results for input(s): CHOL in the last 72 hours. No results for input(s): PROTIME in the last 72 hours.  Imaging: Imaging results have been reviewed  Cardiac Studies:  Assessment/Plan:  S/P STEMI day 1   Hypotension Elevated Troponin Breast cancer with Mets HTN hx S/P PCI stent RCA DES . PLAN Continue ICU level care Bolus fluids for hypotension Hold metoprolol/Lisinopril Continue ASA 81 mg qd Plavix 75 mg qd x 1 year ECHO post STEMI Continue Seizure meds Replace Mg/K+      LOS: 1 day    Latoya Maulding D Brailey Buescher 07/12/2016, 5:06 PM

## 2016-07-12 NOTE — Progress Notes (Addendum)
Pt was going to be transfered to floor, report called, BP rececked and found to be 76/50 . Dr Posey Pronto notified, order for transfer cancelled, IV fluid bolus and drip ordered.

## 2016-07-12 NOTE — Consult Note (Signed)
Reason for Consult: STEMI inferior late presenting Referring Physician: Emergency room [facility] FNP  Elizabeth Bond is an 66 y.o. female.  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 Patient presented to emergency room with headaches eventually EKG was performed which showed ST elevation inferiorly but with inferior Q waves reciprocal depression laterally. Patient gave a history of chest pain yesterday but did not tell anyone or seek medical attention. Patient had altered mental status was lethargic and sleeping a lot, evaluation in emergency room the family was not sure whether they want to proceed with intervention to the catheter lab. But after discussing the  daughter finally decided to proceed. Patient denies any previous cardiac history has been getting radiation to her head because of brain metastases patient has history of COPD reflux symptoms. Now presents as a late presenting with acute inferior wall myocardial infarction and positive troponins and initial readings of 16.  Past Medical History:  Diagnosis Date  . Arthritis   . Brain metastasis ([place]) 07/06/2016  . Breast cancer ([place])    right  . Cancer ([place]) 2015   breast and bone  . COPD (chronic obstructive pulmonary disease) ([place])   . Depression   . GERD (gastroesophageal reflux disease)   . IBS (irritable bowel syndrome)   . 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 ABDOMINAL HYSTERECTOMY  1978  . CATARACT EXTRACTION W/PHACO Right 06/06/2016   Procedure: CATARACT EXTRACTION PHACO AND INTRAOCULAR LENS PLACEMENT (IOC);  Surgeon: Birder Robson, MD;  Location: [facility] ORS;  Service: Ophthalmology;  Laterality: Right;  [place] 00:51.1 AP% 14.0 CDE 7.16 Fluid pack lot # [id number]  . CATARACT EXTRACTION W/PHACO Left 06/27/2016   Procedure: CATARACT EXTRACTION PHACO AND INTRAOCULAR LENS PLACEMENT (IOC);  Surgeon: Birder Robson, MD;   Location: [facility] ORS;  Service: Ophthalmology;  Laterality: Left;  [place] 00:37 AP% 19.1 CDE  7.11 Fluid pack lot # [id number]  . CORONARY/GRAFT ACUTE MI REVASCULARIZATION N/A 07/11/2016   Procedure: Coronary/Graft Acute MI Revascularization;  Surgeon: Yolonda Kida, MD;  Location: [place] [facility];  Service: Cardiovascular;  Laterality: N/A;  . FLEXIBLE BRONCHOSCOPY N/A 05/31/2016   Procedure: FLEXIBLE BRONCHOSCOPY;  Surgeon: Wilhelmina Mcardle, MD;  Location: [facility] ORS;  Service: Pulmonary;  Laterality: N/A;  . JOINT REPLACEMENT    . LEFT HEART CATH AND CORONARY ANGIOGRAPHY N/A 07/11/2016   Procedure: Left Heart Cath and Coronary Angiography;  Surgeon: Yolonda Kida, MD;  Location: [place] [facility];  Service: Cardiovascular;  Laterality: N/A;  . PORTACATH PLACEMENT  2015  . REPLACEMENT TOTAL KNEE Left 2004-2005?    Family History  Problem Relation Age of Onset  . Cancer Father        prostate  . Cancer Mother        Pancreatic  . Cancer Maternal Aunt        breast  . Cancer Cousin        breast  . Cancer Other        lung cancer - neice  . Cancer - Other Daughter        brain    Social History:  reports that she has been smoking Cigarettes.  She has a 10.00 pack-year smoking history. She has never used smokeless tobacco. She reports that she does not drink alcohol or use drugs.  Allergies:  Allergies  Allergen Reactions  . Fentanyl Palpitations and Other (See Comments)    "  loopy and confused" "feels like heart is going to come out of my chest"    Medications: I have reviewed the patient's current medications.  Results for orders placed or performed during the hospital encounter of 07/11/16 (from the past 48 hour(s))  CBC with Differential/Platelet     Status: Abnormal   Collection Time: 07/11/16  1:40 PM  Result Value Ref Range   WBC 11.7 (H) 3.6 - 11.0 K/uL   RBC 4.54 3.80 - 5.20 MIL/uL   Hemoglobin 15.1 12.0 - 16.0 g/dL   HCT 44.4 35.0 - 47.0 %   MCV 97.8  80.0 - 100.0 fL   MCH 33.4 26.0 - 34.0 pg   MCHC 34.1 32.0 - 36.0 g/dL   RDW 16.9 (H) 11.5 - 14.5 %   Platelets 136 (L) 150 - 440 K/uL   Neutrophils Relative % 92 %   Neutro Abs 10.8 (H) 1.4 - 6.5 K/uL   Lymphocytes Relative 3 %   Lymphs Abs 0.3 (L) 1.0 - 3.6 K/uL   Monocytes Relative 5 %   Monocytes Absolute 0.6 0.2 - 0.9 K/uL   Eosinophils Relative 0 %   Eosinophils Absolute 0.0 0 - 0.7 K/uL   Basophils Relative 0 %   Basophils Absolute 0.0 0 - 0.1 K/uL  Basic metabolic panel     Status: Abnormal   Collection Time: 07/11/16  1:40 PM  Result Value Ref Range   Sodium 140 135 - 145 mmol/L   Potassium 4.9 3.5 - 5.1 mmol/L   Chloride 104 101 - 111 mmol/L   CO2 28 22 - 32 mmol/L   Glucose, Bld 113 (H) 65 - 99 mg/dL   BUN 43 (H) 6 - 20 mg/dL   Creatinine, Ser 0.70 0.44 - 1.00 mg/dL   Calcium 9.6 8.9 - 10.3 mg/dL   GFR calc non Af Amer >60 >60 mL/min   GFR calc Af Amer >60 >60 mL/min    Comment: (NOTE) The eGFR has been calculated using the CKD EPI equation. This calculation has not been validated in all clinical situations. eGFR's persistently <60 mL/min signify possible Chronic Kidney Disease.    Anion gap 8 5 - 15  Troponin I     Status: Abnormal   Collection Time: 07/11/16  1:40 PM  Result Value Ref Range   Troponin I 16.20 (HH) <0.03 ng/mL    Comment: CRITICAL RESULT CALLED TO, READ BACK BY AND VERIFIED WITH KATE BRUMGARNER AT 1441 ON 07/11/2016 JJB   POCT Activated clotting time     Status: None   Collection Time: 07/11/16  3:27 PM  Result Value Ref Range   Activated Clotting Time 318 seconds  MRSA PCR Screening     Status: None   Collection Time: 07/11/16  4:12 PM  Result Value Ref Range   MRSA by PCR NEGATIVE NEGATIVE    Comment:        The GeneXpert MRSA Assay (FDA approved for NASAL specimens only), is one component of a comprehensive MRSA colonization surveillance program. It is not intended to diagnose MRSA infection nor to guide or monitor treatment  for MRSA infections.   Glucose, capillary     Status: None   Collection Time: 07/11/16  4:34 PM  Result Value Ref Range   Glucose-Capillary 97 65 - 99 mg/dL  APTT     Status: Abnormal   Collection Time: 07/11/16  4:40 PM  Result Value Ref Range   aPTT 83 (H) 24 - 36 seconds    Comment:  IF BASELINE aPTT IS ELEVATED, SUGGEST PATIENT RISK ASSESSMENT BE USED TO DETERMINE APPROPRIATE ANTICOAGULANT THERAPY.   Protime-INR     Status: Abnormal   Collection Time: 07/11/16  4:40 PM  Result Value Ref Range   Prothrombin Time 21.2 (H) 11.4 - 15.2 seconds   INR 1.81   Troponin I (q 6hr x 3)     Status: Abnormal   Collection Time: 07/11/16  4:40 PM  Result Value Ref Range   Troponin I >65.00 (HH) <0.03 ng/mL    Comment: CRITICAL RESULT CALLED TO, READ BACK BY AND VERIFIED WITH JAMIE SMITH AT 1728 ON 07/11/2016 JJB   Troponin I (q 6hr x 3)     Status: Abnormal   Collection Time: 07/11/16 10:22 PM  Result Value Ref Range   Troponin I >65.00 (HH) <0.03 ng/mL    Comment: CRITICAL RESULT CALLED TO, READ BACK BY AND VERIFIED WITH BETH BUONO ON 07/11/16 AT 2311 [facility]   Troponin I (q 6hr x 3)     Status: Abnormal   Collection Time: 07/12/16  4:15 AM  Result Value Ref Range   Troponin I >65.00 (HH) <0.03 ng/mL    Comment: CRITICAL VALUE NOTED. VALUE IS CONSISTENT WITH PREVIOUSLY REPORTED/CALLED VALUE...[facility]  Basic metabolic panel     Status: Abnormal   Collection Time: 07/12/16  4:15 AM  Result Value Ref Range   Sodium 139 135 - 145 mmol/L   Potassium 4.4 3.5 - 5.1 mmol/L   Chloride 107 101 - 111 mmol/L   CO2 27 22 - 32 mmol/L   Glucose, Bld 89 65 - 99 mg/dL   BUN 31 (H) 6 - 20 mg/dL   Creatinine, Ser 0.58 0.44 - 1.00 mg/dL   Calcium 8.2 (L) 8.9 - 10.3 mg/dL   GFR calc non Af Amer >60 >60 mL/min   GFR calc Af Amer >60 >60 mL/min    Comment: (NOTE) The eGFR has been calculated using the CKD EPI equation. This calculation has not been validated in all clinical situations. eGFR's  persistently <60 mL/min signify possible Chronic Kidney Disease.    Anion gap 5 5 - 15  CBC     Status: Abnormal   Collection Time: 07/12/16  4:15 AM  Result Value Ref Range   WBC 6.1 3.6 - 11.0 K/uL   RBC 4.06 3.80 - 5.20 MIL/uL   Hemoglobin 13.6 12.0 - 16.0 g/dL   HCT 39.7 35.0 - 47.0 %   MCV 97.7 80.0 - 100.0 fL   MCH 33.5 26.0 - 34.0 pg   MCHC 34.3 32.0 - 36.0 g/dL   RDW 17.0 (H) 11.5 - 14.5 %   Platelets 112 (L) 150 - 440 K/uL    Ct Head Wo Contrast  Result Date: 07/11/2016 CLINICAL DATA:  Worsening headaches, metastatic breast cancer, intracranial metastases EXAM: CT HEAD WITHOUT CONTRAST TECHNIQUE: Contiguous axial images were obtained from the base of the skull through the vertex without intravenous contrast. COMPARISON:  06/28/2016 FINDINGS: Brain: Grossly stable size of the known intracranial metastases within the right caudate, left frontal lobe, left parietooccipital lobes, and right temporal lobe. Similar to slight improvement in the asymmetric diffuse vasogenic edema pattern throughout both cerebral hemispheres, worse on the left. No hydrocephalus. No significant midline shift. No acute intracranial hemorrhage or extra-axial fluid collection. Basilar cisterns remain patent. No gross cerebellar abnormality. Vascular: No hyperdense vessel or unexpected calcification. Skull: Negative for fracture. No focal osseous abnormality. Right mastoid effusion noted. Sinuses/Orbits: No acute finding. Other: None. IMPRESSION: Grossly  stable known intracranial metastases with some improvement in the diffuse vasogenic edema pattern throughout the cerebral hemispheres. No acute intracranial hemorrhage, hydrocephalus, or significant herniation. Electronically Signed   By: Jerilynn Mages.  Shick M.D.   On: 07/11/2016 13:47   Dg Chest Portable 1 View  Result Date: 07/11/2016 CLINICAL DATA:  Chest pain.  Concern for pneumonia EXAM: PORTABLE CHEST 1 VIEW COMPARISON:  05/31/2016 FINDINGS: Lower lung volumes with  interstitial crowding. No asymmetric opacity to suggest pneumonia. No edema, effusion, or pneumothorax. Chronic cardiomegaly and aortic tortuosity. Left subclavian porta catheter with tip at the lower SVC. IMPRESSION: 1. No new or focal opacity to suggest pneumonia. 2. Numerous pulmonary nodules on 05/19/2016 PET-CT that were radiographically occult on subsequent chest x-ray. Electronically Signed   By: Monte Fantasia M.D.   On: 07/11/2016 14:07    Review of Systems  Constitutional: Positive for malaise/fatigue.  HENT: Positive for congestion.   Eyes: Negative.   Respiratory: Positive for cough, shortness of breath and wheezing.   Cardiovascular: Positive for chest pain and palpitations.  Gastrointestinal: Negative.   Genitourinary: Negative.   Musculoskeletal: Positive for myalgias.  Skin: Negative.   Neurological: Positive for seizures and weakness.  Endo/Heme/Allergies: Negative.   Psychiatric/Behavioral: Positive for depression.   Blood pressure 110/72, pulse 78, temperature (!) 97.4 F (36.3 C), resp. rate 19, height _0  (1.626 m), weight 57.4 kg (126 lb 8.7 oz), SpO2 100 %. Physical Exam  Nursing note and vitals reviewed. Constitutional: She is oriented to person, place, and time. She appears well-developed and well-nourished.  HENT:  Head: Normocephalic and atraumatic.  Eyes: Conjunctivae and EOM are normal. Pupils are equal, round, and reactive to light.  Neck: Normal range of motion. Neck supple.  Cardiovascular: Normal rate and regular rhythm.   Murmur heard. Respiratory: Effort normal and breath sounds normal.  GI: Soft. Bowel sounds are normal.  Musculoskeletal: Normal range of motion.  Neurological: She is alert and oriented to person, place, and time. She has normal reflexes.  Skin: Skin is warm and dry.  Psychiatric: She has a normal mood and affect.    Assessment/Plan: STEMI IMI Late presenting Breast cancer brain metastases COPD Seizure disorder Undergoing  XRT to the brain GERD Irritable bowel syndrome Depression Status post chemotherapy   PLAN STEMI inferior wall proceed with catheter PCI and stent PCI and stent RCA with DES Recommend metoprolol post-MI Continue lisinopril post-MI Aspirin 81 mg daily Recommend Plavix 75 mg post-PCI for at least a year Start Lipitor 80 mg once a day Follow-up oncology for metastatic breast cancer Supplemental oxygen as necessary   Kadience Macchi D Ellysia Char 07/12/2016, 5:20 PM

## 2016-07-12 NOTE — Progress Notes (Signed)
[place] [facility] [place] NAME: Elizabeth Bond    MR#:  [id number]  DATE OF BIRTH:  04-Aug-1950  SUBJECTIVE:   Patient pleasantly confused. Denies any chest pain. Had earlier related hypotension. Improved after IV fluid bolus. REVIEW OF SYSTEMS:   Review of Systems  Unable to perform ROS: Mental status change   Tolerating Diet:yes Tolerating PT: pending PT  DRUG ALLERGIES:   Allergies  Allergen Reactions  . Fentanyl Palpitations and Other (See Comments)    "loopy and confused" "feels like heart is going to come out of my chest"    VITALS:  Blood pressure (!) 94/54, pulse 62, temperature (!) 97.3 F (36.3 C), resp. rate 16, height 5\' 4"  (1.626 m), weight 57.4 kg (126 lb 8.7 oz), SpO2 95 %.  PHYSICAL EXAMINATION:   Physical Exam  GENERAL:  66 y.o.-year-old patient lying in the bed with no acute distress.  EYES: Pupils equal, round, reactive to light and accommodation. No scleral icterus. Extraocular muscles intact.  HEENT: Head atraumatic, normocephalic. Oropharynx and nasopharynx clear.  NECK:  Supple, no jugular venous distention. No thyroid enlargement, no tenderness.  LUNGS: Normal breath sounds bilaterally, no wheezing, rales, rhonchi. No use of accessory muscles of respiration.  CARDIOVASCULAR: S1, S2 normal. No murmurs, rubs, or gallops.  ABDOMEN: Soft, nontender, nondistended. Bowel sounds present. No organomegaly or mass.  EXTREMITIES: No cyanosis, clubbing or edema b/l.    NEUROLOGIC: Cranial nerves II through XII are intact. No focal Motor or sensory deficits b/l.   PSYCHIATRIC:  patient is alert  But pleasantly confused.  SKIN: No obvious rash, lesion, or ulcer.   LABORATORY PANEL:  CBC  Recent Labs Lab 07/12/16 0415  WBC 6.1  HGB 13.6  HCT 39.7  PLT 112*    Chemistries   Recent Labs Lab 07/12/16 0415  NA 139  K 4.4  CL 107  CO2 27  GLUCOSE 89  BUN 31*  CREATININE 0.58  CALCIUM 8.2*   Cardiac  Enzymes  Recent Labs Lab 07/12/16 0415  TROPONINI >65.00*   RADIOLOGY:  Ct Head Wo Contrast  Result Date: 07/11/2016 CLINICAL DATA:  Worsening headaches, metastatic breast cancer, intracranial metastases EXAM: CT HEAD WITHOUT CONTRAST TECHNIQUE: Contiguous axial images were obtained from the base of the skull through the vertex without intravenous contrast. COMPARISON:  06/28/2016 FINDINGS: Brain: Grossly stable size of the known intracranial metastases within the right caudate, left frontal lobe, left parietooccipital lobes, and right temporal lobe. Similar to slight improvement in the asymmetric diffuse vasogenic edema pattern throughout both cerebral hemispheres, worse on the left. No hydrocephalus. No significant midline shift. No acute intracranial hemorrhage or extra-axial fluid collection. Basilar cisterns remain patent. No gross cerebellar abnormality. Vascular: No hyperdense vessel or unexpected calcification. Skull: Negative for fracture. No focal osseous abnormality. Right mastoid effusion noted. Sinuses/Orbits: No acute finding. Other: None. IMPRESSION: Grossly stable known intracranial metastases with some improvement in the diffuse vasogenic edema pattern throughout the cerebral hemispheres. No acute intracranial hemorrhage, hydrocephalus, or significant herniation. Electronically Signed   By: Jerilynn Mages.  Shick M.D.   On: 07/11/2016 13:47   Dg Chest Portable 1 View  Result Date: 07/11/2016 CLINICAL DATA:  Chest pain.  Concern for pneumonia EXAM: PORTABLE CHEST 1 VIEW COMPARISON:  05/31/2016 FINDINGS: Lower lung volumes with interstitial crowding. No asymmetric opacity to suggest pneumonia. No edema, effusion, or pneumothorax. Chronic cardiomegaly and aortic tortuosity. Left subclavian porta catheter with tip at the lower SVC. IMPRESSION: 1. No new  or focal opacity to suggest pneumonia. 2. Numerous pulmonary nodules on 05/19/2016 PET-CT that were radiographically occult on subsequent chest x-ray.  Electronically Signed   By: Monte Fantasia M.D.   On: 07/11/2016 14:07   ASSESSMENT AND PLAN:  Elizabeth Bond  is a 66 y.o. 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  #1 Inferior STEMI-appreciate cards consult - s/p DES, started on asa, plavix, metoprolol, lisinopril - EF 45%  Based on cath.  #2 metastatic breast cancer-recent diagnosis of brain medicines since 2 weeks ago. - Continue Keppra. No further seizures at home. - Continue whole brain radiation. Also on Decadron. Follow-up with oncology  #3 anxiety-continue home medications.  #4 GERD-Protonix  #5 DVT prophylaxis-coumadin  Case discussed with Care Management/Social Worker. Management plans discussed with the patient, family and they are in agreement.  CODE STATUS: full  DVT Prophylaxis: coumadin  TOTAL TIME TAKING CARE OF THIS PATIENT: *30* minutes.  >50% time spent on counselling and coordination of care  POSSIBLE D/C IN *1-2* DAYS, DEPENDING ON CLINICAL CONDITION.  Note: This dictation was prepared with Dragon dictation along with smaller phrase technology. Any transcriptional errors that result from this process are unintentional.  Sidonie Dexheimer M.D on 07/12/2016 at 1:58 PM  Between 7am to 6pm - Pager - [phone]  After 6pm go to [email or web address] - password [email or web address] [facility]  Office  [phone]  CC: Primary care physician; Remi Haggard, FNP

## 2016-07-12 NOTE — Progress Notes (Signed)
Pt awake and alert today, groin site is intact, no complaints of any pain. Dr Clayborn Bigness in to see, BP 94 , then 88 systolic, to give 552 mls of fluid bolus over 2 hours, hold BP meds at this time ,  then transfer to floor in stable condition. On RA

## 2016-07-13 ENCOUNTER — Ambulatory Visit: Payer: Medicare Other

## 2016-07-13 DIAGNOSIS — Z515 Encounter for palliative care: Secondary | ICD-10-CM

## 2016-07-13 DIAGNOSIS — C50919 Malignant neoplasm of unspecified site of unspecified female breast: Secondary | ICD-10-CM

## 2016-07-13 DIAGNOSIS — Z7189 Other specified counseling: Secondary | ICD-10-CM

## 2016-07-13 DIAGNOSIS — R51 Headache: Secondary | ICD-10-CM

## 2016-07-13 DIAGNOSIS — E440 Moderate protein-calorie malnutrition: Secondary | ICD-10-CM | POA: Insufficient documentation

## 2016-07-13 MED ORDER — ENOXAPARIN SODIUM 40 MG/0.4ML ~~LOC~~ SOLN
40.0000 mg | SUBCUTANEOUS | Status: DC
  Administered 2016-07-13: 40 mg via SUBCUTANEOUS
  Filled 2016-07-13: qty 0.4

## 2016-07-13 MED ORDER — ADULT MULTIVITAMIN W/MINERALS CH
1.0000 | ORAL_TABLET | Freq: Every day | ORAL | Status: DC
  Administered 2016-07-14: 1 via ORAL
  Filled 2016-07-13: qty 1

## 2016-07-13 MED ORDER — METOPROLOL TARTRATE 25 MG PO TABS
12.5000 mg | ORAL_TABLET | Freq: Two times a day (BID) | ORAL | Status: DC
  Administered 2016-07-14: 12.5 mg via ORAL
  Filled 2016-07-13: qty 1

## 2016-07-13 MED ORDER — TRAMADOL HCL 50 MG PO TABS
50.0000 mg | ORAL_TABLET | Freq: Four times a day (QID) | ORAL | Status: DC | PRN
  Administered 2016-07-13: 50 mg via ORAL
  Filled 2016-07-13: qty 1

## 2016-07-13 NOTE — Consult Note (Signed)
Consultation Note Date: 07/13/16  Patient Name: Elizabeth Bond  DOB: Jun 25, 1950  MRN: [id number]  Age / Sex: 66 y.o., female  PCP: Elizabeth Haggard, FNP Referring Physician: Fritzi Mandes, MD  Reason for Consultation: Establishing goals of care  HPI/Patient Profile: 66 y.o. female  with past medical history of metastatic breast cancer, COPD, smoker, IBS, GERD, depression, arthritis, and seizures admitted on 07/11/2016 with headache. Followed by oncology for breast cancer (diagnosed in 2015) with mets to bone and brain. Notes reviewed. Currently receiving radiation. In ED, troponins elevated and EKG revealed STEMI. Cardiac cath performed s/p DES in mid RCA. EF 45%. Palliative medicine consultation for goals of care.    Clinical Assessment and Goals of Care: I have reviewed medical records, discussed with care team, and met with patient and daughter (Elizabeth Bond) at bedside to discuss diagnosis, GOC, EOL wishes, disposition and options.  Introduced Palliative Medicine as specialized medical care for people living with serious illness. It focuses on providing relief from the symptoms and stress of a serious illness. The goal is to improve quality of life for both the patient and the family.  We discussed a brief life review of the patient. Widowed. One daughter. Lives with grandson. Diagnosed with breast cancer in 2015 and has received chemo/radiation. Started radiation last week for mets to brain. C/o headaches. Prior to hospitalization, able to ambulate independently but with increased weakness. Able to perform ADL's. Appetite has been good.   Discussed hospital diagnoses and interventions. No chest pain. Will f/u with Cardiology outpatient.   I attempted to elicit values and goals of care important to the patient. Remaining home is important. Elizabeth Bond has told her she would never put her in a nursing home.   Advanced  directives, concepts specific to code status, artifical feeding and hydration, and rehospitalization were considered and discussed. Elizabeth Bond is documented HCPOA. Patient has completed advanced directive packet and copy placed in chart. Patient has spoken of NOT wanting life prolonging measures. Asked her about code status. She tells me she does NOT want resuscitation or life support if she were to naturally die. Elizabeth Bond hesitant with this decision but wants to honor her mothers wishes. "We still need to do what we can" (besides CPR if she were to die). Educated and completed MOST form--DNR/DNI but use non-invasive forms of oxygen (BiPAP/CPAP). Also re-hospitalization, ABX, IVF, and feeding tube for a time trial. Patient and daughter agree and confirm understanding of DNR code status and MOST form.   At this point, daughter hopeful to get "family to pull together" and check on her more frequently during the day when she and grandson are at work. Agrees with home PT. Medicaid pending. Palliative Care services outpatient were explained and offered. Patient/daughter agreeable understanding this can transition to hospice services when appropriate.   Questions and concerns were addressed. Emotional support provided.     SUMMARY OF RECOMMENDATIONS    Patient requests DNR/DNI code status. Durable DNR completed and placed in chart. Copy made for daughter.  Completed MOST  form with patient/daughter. They confirm understanding.   Continue radiation outpatient.   Agreeable to palliative services to follow outpatient.   Code Status/Advance Care Planning:  DNR  Symptom Management:   Tramadol 40m q6h prn moderate pain  Palliative Prophylaxis:   Aspiration, Delirium Protocol, Frequent Pain Assessment and Oral Care  Additional Recommendations (Limitations, Scope, Preferences):  Full Scope Treatment-except DNR/DNI  Psycho-social/Spiritual:   Desire for further Chaplaincy support:yes  Additional  Recommendations: Caregiving  Support/Resources and Education on Hospice  Prognosis:   Unable to determine  Discharge Planning: Home with Home Health palliative services to follow      Primary Diagnoses: Present on Admission: . Acute ST elevation myocardial infarction (STEMI) involving right coronary artery without development of Q waves ([facility] . STEMI (ST elevation myocardial infarction) ([facility]   I have reviewed the medical record, interviewed the patient and family, and examined the patient. The following aspects are pertinent.  Past Medical History:  Diagnosis Date  . Arthritis   . Brain metastasis ([facility] 07/06/2016  . Breast cancer ([facility]    right  . Cancer ([facility] 2015   breast and bone  . COPD (chronic obstructive pulmonary disease) ([facility]   . Depression   . GERD (gastroesophageal reflux disease)   . IBS (irritable bowel syndrome)   . Metastasis to bone ([facility] 09/01/2014   Social History   Social History  . Marital status: Widowed    Spouse name: N/A  . Number of children: N/A  . Years of education: N/A   Social History Main Topics  . Smoking status: Current Every Day Smoker    Packs/day: 0.25    Years: 40.00    Types: Cigarettes  . Smokeless tobacco: Never Used  . Alcohol use No  . Drug use: No  . Sexual activity: Not Asked   Other Topics Concern  . None   Social History Narrative  . None   Family History  Problem Relation Age of Onset  . Cancer Father        prostate  . Cancer Mother        Pancreatic  . Cancer Maternal Aunt        breast  . Cancer Cousin        breast  . Cancer Other        lung cancer - neice  . Cancer - Other Daughter        brain   Scheduled Meds: . aspirin  81 mg Oral Daily  . atorvastatin  80 mg Oral q1800  . calcium-vitamin D  1 tablet Oral BID  . clopidogrel  75 mg Oral Q breakfast  . dexamethasone  4 mg Oral BID WC  . enoxaparin (LOVENOX) injection  40 mg Subcutaneous Q24H  . levETIRAcetam  500 mg Oral BID  .  lisinopril  5 mg Oral BID  . magnesium oxide  400 mg Oral Daily  . metoprolol tartrate  12.5 mg Oral BID  . multivitamin with minerals  1 tablet Oral Daily  . nicotine  21 mg Transdermal Daily  . pantoprazole  40 mg Oral Daily  . sodium chloride flush  3 mL Intravenous Q12H   Continuous Infusions: . sodium chloride 250 mL (07/12/16 1702)   PRN Meds:.sodium chloride, acetaminophen **OR** acetaminophen, clonazePAM, ondansetron **OR** ondansetron (ZOFRAN) IV, sodium chloride flush, temazepam, traMADol Medications Prior to Admission:  Prior to Admission medications   Medication Sig Start Date End Date Taking? Authorizing Provider  acetaminophen (TYLENOL) 325 MG tablet Take 650 mg  by mouth daily as needed for headache.   Yes [provider]  Calcium Carb-Cholecalciferol (CALCIUM-VITAMIN D3) 600-400 MG-UNIT TABS Take 1 tablet by mouth 2 (two) times daily.   Yes [provider]  clonazePAM (KLONOPIN) 0.5 MG tablet TAKE 1 TABLET 3 TIMES DAILY AS NEEDED FOR ANXIETY 07/10/16  Yes Corcoran, Melissa C, MD  dexamethasone (DECADRON) 4 MG tablet Take 1 tablet (4 mg total) by mouth 2 (two) times daily with a meal. 06/29/16  Yes Mody, Sital, MD  levETIRAcetam (KEPPRA) 500 MG tablet Take 1 tablet (500 mg total) by mouth 2 (two) times daily. 06/29/16  Yes Mody, Sital, MD  loratadine (CLARITIN) 10 MG tablet Take 10 mg by mouth daily as needed for allergies.    Yes [provider]  Magnesium 500 MG TABS Take 500 mg by mouth daily.    Yes [provider]  pantoprazole (PROTONIX) 40 MG tablet Take 1 tablet (40 mg total) by mouth daily. 03/23/16  Yes Corcoran, Melissa C, MD  phosphorus (K PHOS NEUTRAL) 155-852-130 MG tablet Take 1 tablet (250 mg total) by mouth 2 (two) times daily. 06/23/16  Yes Corcoran, Melissa C, MD  potassium chloride (K-DUR) 10 MEQ tablet TAKE ONE TABLET BY MOUTH EVERY DAY 06/23/16  Yes Corcoran, Melissa C, MD  temazepam (RESTORIL) 15 MG capsule TAKE 1 CAPSULE AT  BEDTIME AS NEEDED FOR SLEEP 06/05/16  Yes Corcoran, Melissa C, MD   Allergies  Allergen Reactions  . Fentanyl Palpitations and Other (See Comments)    "loopy and confused" "feels like heart is going to come out of my chest"   Review of Systems  Neurological: Positive for headaches.  All other systems reviewed and are negative.  Physical Exam  Constitutional: She is oriented to person, place, and time. She is cooperative. She appears ill.  HENT:  Head: Normocephalic and atraumatic.  Cardiovascular: Regular rhythm.   Pulmonary/Chest: Effort normal.  Abdominal: Normal appearance.  Neurological: She is alert and oriented to person, place, and time.  Skin: Skin is warm and dry.  Psychiatric: Her speech is normal and behavior is normal.  irritable  Nursing note and vitals reviewed.  Vital Signs: BP 102/61   Pulse 68   Temp 97.7 F (36.5 C) (Oral)   Resp 18   Ht 5' 4" (1.626 m)   Wt 57.4 kg (126 lb 8.7 oz)   SpO2 99%   BMI 21.72 kg/m  Pain Assessment: No/denies pain   Pain Score: 0-No pain  SpO2: SpO2: 99 % O2 Device:SpO2: 99 % O2 Flow Rate: .O2 Flow Rate (L/min): 2 L/min  IO: Intake/output summary:   Intake/Output Summary (Last 24 hours) at 07/14/16 0816 Last data filed at 07/14/16 0119  Gross per 24 hour  Intake              360 ml  Output              100 ml  Net              260 ml    LBM: Last BM Date: 07/13/16 Baseline Weight: Weight: 52.6 kg (116 lb) Most recent weight: Weight: 57.4 kg (126 lb 8.7 oz)     Palliative Assessment/Data: PPS 50%   Flowsheet Rows     Most Recent Value  Intake Tab  Referral Department  Hospitalist  Unit at Time of Referral  ICU  Palliative Care Primary Diagnosis  Cancer  Palliative Care Type  New Palliative care  Reason for referral    Clarify Goals of Care  Date of Admission  07/11/16  Date first seen by Palliative Care  07/13/16  Clinical Assessment  Palliative Performance Scale Score  50%  Psychosocial & Spiritual  Assessment  Palliative Care Outcomes  Patient/Family meeting held?  Yes  Who was at the meeting?  patient, daughter, granddaughter  Palliative Care Outcomes  Clarified goals of care, Improved pain interventions, Provided advance care planning, Provided psychosocial or spiritual support, Changed CPR status, Completed durable DNR, Linked to palliative care logitudinal support, ACP counseling assistance      Time In/Out: 1100-1130, 1448-1856  Time Total: 64mn Greater than 50%  of this time was spent counseling and coordinating care related to the above assessment and plan.  Signed by:  MIhor Dow FNP-C Palliative Medicine Team  Phone: [phone]: [phone]  Please contact Palliative Medicine Team phone at [phone] questions and concerns.  For individual provider: See AShea Evans

## 2016-07-13 NOTE — Progress Notes (Signed)
[place] [facility] [place] NAME: Elizabeth Bond    MR#:  [id number]  DATE OF BIRTH:  11/22/1950  SUBJECTIVE:   Patient pleasantly confused. Denies any chest pain. Had earlier related hypotension. Improved after IV fluid bolus. REVIEW OF SYSTEMS:   Review of Systems  Unable to perform ROS: Mental status change  Constitutional: Negative for chills, fever and weight loss.  HENT: Negative for ear discharge, ear pain and nosebleeds.   Eyes: Negative for blurred vision, pain and discharge.  Respiratory: Negative for sputum production, shortness of breath, wheezing and stridor.   Cardiovascular: Negative for chest pain, palpitations, orthopnea and PND.  Gastrointestinal: Negative for abdominal pain, diarrhea, nausea and vomiting.  Genitourinary: Negative for frequency and urgency.  Musculoskeletal: Negative for back pain and joint pain.  Neurological: Positive for weakness. Negative for sensory change, speech change and focal weakness.  Psychiatric/Behavioral: Negative for depression and hallucinations. The patient is not nervous/anxious.    Tolerating Diet:yes Tolerating PT: pending PT  DRUG ALLERGIES:   Allergies  Allergen Reactions  . Fentanyl Palpitations and Other (See Comments)    "loopy and confused" "feels like heart is going to come out of my chest"    VITALS:  Blood pressure (!) 85/56, pulse 71, temperature 98.3 F (36.8 C), resp. rate 19, height 5\' 4"  (1.626 m), weight 57.4 kg (126 lb 8.7 oz), SpO2 97 %.  PHYSICAL EXAMINATION:   Physical Exam  GENERAL:  67 y.o.-year-old patient lying in the bed with no acute distress.  EYES: Pupils equal, round, reactive to light and accommodation. No scleral icterus. Extraocular muscles intact.  HEENT: Head atraumatic, normocephalic. Oropharynx and nasopharynx clear.  NECK:  Supple, no jugular venous distention. No thyroid enlargement, no tenderness.  LUNGS: Normal breath sounds bilaterally,  no wheezing, rales, rhonchi. No use of accessory muscles of respiration.  CARDIOVASCULAR: S1, S2 normal. No murmurs, rubs, or gallops.  ABDOMEN: Soft, nontender, nondistended. Bowel sounds present. No organomegaly or mass.  EXTREMITIES: No cyanosis, clubbing or edema b/l.    NEUROLOGIC: Cranial nerves II through XII are intact. No focal Motor or sensory deficits b/l.   PSYCHIATRIC:  patient is alert  But pleasantly confused.  SKIN: No obvious rash, lesion, or ulcer.   LABORATORY PANEL:  CBC  Recent Labs Lab 07/12/16 0415  WBC 6.1  HGB 13.6  HCT 39.7  PLT 112*    Chemistries   Recent Labs Lab 07/12/16 0415  NA 139  K 4.4  CL 107  CO2 27  GLUCOSE 89  BUN 31*  CREATININE 0.58  CALCIUM 8.2*   Cardiac Enzymes  Recent Labs Lab 07/12/16 0415  TROPONINI >65.00*   RADIOLOGY:  Ct Head Wo Contrast  Result Date: 07/11/2016 CLINICAL DATA:  Worsening headaches, metastatic breast cancer, intracranial metastases EXAM: CT HEAD WITHOUT CONTRAST TECHNIQUE: Contiguous axial images were obtained from the base of the skull through the vertex without intravenous contrast. COMPARISON:  06/28/2016 FINDINGS: Brain: Grossly stable size of the known intracranial metastases within the right caudate, left frontal lobe, left parietooccipital lobes, and right temporal lobe. Similar to slight improvement in the asymmetric diffuse vasogenic edema pattern throughout both cerebral hemispheres, worse on the left. No hydrocephalus. No significant midline shift. No acute intracranial hemorrhage or extra-axial fluid collection. Basilar cisterns remain patent. No gross cerebellar abnormality. Vascular: No hyperdense vessel or unexpected calcification. Skull: Negative for fracture. No focal osseous abnormality. Right mastoid effusion noted. Sinuses/Orbits: No acute finding. Other: None. IMPRESSION: Grossly  stable known intracranial metastases with some improvement in the diffuse vasogenic edema pattern throughout  the cerebral hemispheres. No acute intracranial hemorrhage, hydrocephalus, or significant herniation. Electronically Signed   By: Jerilynn Mages.  Shick M.D.   On: 07/11/2016 13:47   Dg Chest Portable 1 View  Result Date: 07/11/2016 CLINICAL DATA:  Chest pain.  Concern for pneumonia EXAM: PORTABLE CHEST 1 VIEW COMPARISON:  05/31/2016 FINDINGS: Lower lung volumes with interstitial crowding. No asymmetric opacity to suggest pneumonia. No edema, effusion, or pneumothorax. Chronic cardiomegaly and aortic tortuosity. Left subclavian porta catheter with tip at the lower SVC. IMPRESSION: 1. No new or focal opacity to suggest pneumonia. 2. Numerous pulmonary nodules on 05/19/2016 PET-CT that were radiographically occult on subsequent chest x-ray. Electronically Signed   By: Monte Fantasia M.D.   On: 07/11/2016 14:07   ASSESSMENT AND PLAN:  Elizabeth Bond  is a 66 y.o. 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  #1 Inferior STEMI -appreciate cards consult with Dr Clayborn Bigness - s/p DES in Mid RCA - on asa, plavix, metoprolol, lisinopril - EF 45%  Based on cath.  #2 metastatic breast cancer-recent diagnosis of brain medicines since 2 weeks ago. - Continue Keppra. No further seizures at home. - Continue whole brain radiation. Also on Decadron. Follow-up with oncology -palliative care consulted  #3 anxiety-continue home medications.  #4 GERD-Protonix  #5 DVT prophylaxis-coumadin  CM for d/c planning PT to see Transfer to 2 A  Case discussed with Care Management/Social Worker. Management plans discussed with the patient, family and they are in agreement.  CODE STATUS: full  DVT Prophylaxis: coumadin  TOTAL TIME TAKING CARE OF THIS PATIENT: *30*  minutes.  >50% time spent on counselling and coordination of care  POSSIBLE D/C IN *1-2* DAYS, DEPENDING ON CLINICAL CONDITION.  Note: This dictation was prepared with Dragon dictation along with smaller phrase technology. Any transcriptional errors that result from this process are unintentional.  Lliam Hoh M.D on 07/13/2016 at 1:07 PM  Between 7am to 6pm - Pager - [phone]  After 6pm go to [email or web address] - password [email or web address] [facility]  Office  [phone]  CC: Primary care physician; Remi Haggard, FNP

## 2016-07-13 NOTE — Progress Notes (Signed)
[place] conversation with patient and daughter/HCPOA. Patient requests to be DNR/DNI. Durable DNR completed. MOST form completed. Plan is to continue radiation. Family agreeable with palliative services to follow home on discharge. Full note to follow.   NO CHARGE  Ihor Dow, FNP-C Palliative Medicine Team  Phone: [phone] Fax: [phone]

## 2016-07-13 NOTE — Care Management Note (Signed)
Case Management Note  Patient Details  Name: Elizabeth Bond MRN: [id number] Date of Birth: 1950/01/04  Subjective/Objective:                  RNCM received call from Dr. Posey Pronto to establish a discharge plan for patient. I met with ICU RN and ICU provider prior to meeting or speaking with patient. Per patient [place] grandson and [name] daughter live with her. Patient indicated that Elida was "son" and that "he was there all the time".  Per ICU RN patient gave her a different story and that patient is confused somewhat.  Patient gave me permission to contact either [place] or [name]. Per [name], RJ is patient's grandson and they both work 8 hours out of the day and patient would be alone.  Patient is independent at baseline however she "has gotten worse with her mobility since the brain cancer".  [name] is concerned about discharge to home as she is not prepared. She has tried to get Medicaid for patient but "getting the run around". She does state that she has promised her mother she would not have to go to a nursing home.  ICU RN/MD feel that PT evaluation and Palliative consult would be beneficial as patient is still a full code. After speaking with [name], she feels that patient is declining. Daughter states she does not have money for private duty.   Action/Plan: Contact number to Medicaid office at [facility] shared with daughter. PT and Palliative consult ordered per ICU MD due to concern for patient's safety. Home health list left at patient's bedside for private duty and home health services under Medicare. Daughter updated. CSW updated.  Expected Discharge Date:                  Expected Discharge Plan:     In-House Referral:     Discharge planning Services  CM Consult  Post Acute Care Choice:  Home Health Choice offered to:  Patient, Adult Children  DME Arranged:    DME Agency:     HH Arranged:    [place] Agency:     Status of Service:  In process, will continue to follow  If discussed at Long  Length of Stay Meetings, dates discussed:    Additional Comments:  Marshell Garfinkel, RN 07/13/2016, 9:12 AM

## 2016-07-13 NOTE — Progress Notes (Signed)
Patient was transfer from unit to this unit at this time, patient alert to self family at bedside, denies pain at this time , patient and family orinted to unit , bed, and call light,

## 2016-07-13 NOTE — Progress Notes (Signed)
Patient with metastatic breast cancer will need palliative care consult. Patient unable to be discharged to home due to unsteady gait, urinary incontinence and inability to take care of herself at home. Patient will need SW consult and assess home needs and NH needs    Corrin Parker, M.D.  [facility]  Medical Director [place] Director [facility] Cardio-Pulmonary Department

## 2016-07-13 NOTE — Evaluation (Signed)
Physical Therapy Evaluation Patient Details Name: Elizabeth Bond MRN: [id number] DOB: 1950/10/07 Today's Date: 07/13/2016   History of Present Illness  Pt is a 67 y/o F who presented with headache and was noted to have STEMI.  Pt's PMH includes metastatic breast cancer with bone metastases, recently diagnosed with brain metastases 2 weeks ago, currently undergoing radiation.  Additional PMH includes COPD, L TKE.      Clinical Impression  Pt admitted with above diagnosis. Pt currently with functional limitations due to the deficits listed below (see PT Problem List). Elizabeth Bond was independent PTA and prior to relatively new finding of brain metastases.  Her 5xSTS score indicated impaired balance and strength, placing the pt at a greater risk of falling.  She will need 24/7 supervision at d/c due to physical and cognitive impairments.  Recommending OT Evaluation to assess pt's safety in the home setting (using stove, using oven, what to do in an emergency, etc). Pt will benefit from skilled PT to increase their independence and safety with mobility to allow discharge to the venue listed below.      Follow Up Recommendations Supervision/Assistance - 24 hour;Home health PT    Equipment Recommendations  None recommended by PT    Recommendations for Other Services OT consult (Cognitive evaluation for safety in home)     Precautions / Restrictions Precautions Precautions: Fall;Other (comment) Precaution Comments: Monitor BP Restrictions Weight Bearing Restrictions: No      Mobility  Bed Mobility Overal bed mobility: Modified Independent             General bed mobility comments: Increased time and effort with cues to scoot to EOB once sitting upright.  Transfers Overall transfer level: Needs assistance Equipment used: None Transfers: Sit to/from Stand Sit to Stand: Min guard         General transfer comment: Min guard for safety as pt unsteady but no LOB.     Ambulation/Gait Ambulation/Gait assistance: Min guard Ambulation Distance (Feet): 200 Feet Assistive device: None Gait Pattern/deviations: Step-through pattern;Decreased stride length;Narrow base of support Gait velocity: decreased Gait velocity interpretation: Below normal speed for age/gender General Gait Details: Pt ambulates with narrow BOS and mild instability but no LOB and no physical assist required.    Stairs            Wheelchair Mobility    Modified Rankin (Stroke Patients Only)       Balance Overall balance assessment: Needs assistance Sitting-balance support: No upper extremity supported;Feet supported Sitting balance-Leahy Scale: Good     Standing balance support: No upper extremity supported;During functional activity Standing balance-Leahy Scale: Fair Standing balance comment: Pt able to perform static and dynamic activities without UE support but likely would lose her balance with perturbations.                             Pertinent Vitals/Pain Pain Assessment: No/denies pain    Home Living Family/patient expects to be discharged to:: Private residence Living Arrangements: Other relatives (Grandson and daughter) Available Help at Discharge: Family;Available PRN/intermittently (Grandson and daughter at work during day) Type of Home: House Home Access: Stairs to enter   Technical brewer of Steps: 2 Home Layout: One level [place]: None      Prior Function Level of Independence: Independent         Comments: Per pt: pt indepdendent with all aspects of mobility and denies any falls in the recent past.  Does  not use AD at baseline.  Pt enjoys cooking and cleaning.     Hand Dominance        Extremity/Trunk Assessment   Upper Extremity Assessment Upper Extremity Assessment:  (BUE strength grossly 4/5;difficulty w/ instructions for MMT)    Lower Extremity Assessment Lower Extremity Assessment:  (BLE strength  grossly 4-/5;difficulty w/ instructions for MMT)       Communication   Communication: No difficulties  Cognition Arousal/Alertness: Awake/alert Behavior During Therapy: WFL for tasks assessed/performed Overall Cognitive Status: Impaired/Different from baseline Area of Impairment: Orientation;Attention;Memory;Following commands;Safety/judgement;Awareness;Problem solving                 Orientation Level: Disoriented to;Time;Situation Current Attention Level: Selective Memory: Decreased short-term memory Following Commands: Follows one step commands inconsistently Safety/Judgement: Decreased awareness of safety;Decreased awareness of deficits Awareness: Intellectual Problem Solving: Slow processing;Requires verbal cues General Comments: Pt does not always follow commands as given, making it difficult to perform complete examination of strength, coordination, etc.  She often answers new questions with old answers.  She is distracted at times, causing her to not hear what instruction is given to her.        General Comments General comments (skin integrity, edema, etc.): 5xSTS; 37.52 indicating pt with impaired balance and BLE strength.   Pt with impaired cognition very concerning for safety in her home, recommending cognitive evaluation from OT to address this.    Exercises     Assessment/Plan    PT Assessment Patient needs continued PT services  PT Problem List Decreased strength;Decreased balance;Decreased safety awareness;Decreased cognition       PT Treatment Interventions DME instruction;Gait training;Stair training;Functional mobility training;Therapeutic activities;Therapeutic exercise;Balance training;Neuromuscular re-education;Cognitive remediation;Patient/family education    PT Goals (Current goals can be found in the Care Plan section)  Acute Rehab PT Goals Patient Stated Goal: to go home PT Goal Formulation: With patient Time For Goal Achievement:  07/27/16 Potential to Achieve Goals: Fair    Frequency Min 2X/week   Barriers to discharge Decreased caregiver support Alone during the day    Co-evaluation               AM-PAC PT "6 Clicks" Daily Activity  Outcome Measure Difficulty turning over in bed (including adjusting bedclothes, sheets and blankets)?: None Difficulty moving from lying on back to sitting on the side of the bed? : A Little Difficulty sitting down on and standing up from a chair with arms (e.g., wheelchair, bedside commode, etc,.)?: A Little Help needed moving to and from a bed to chair (including a wheelchair)?: A Little Help needed walking in hospital room?: A Little Help needed climbing 3-5 steps with a railing? : A Little 6 Click Score: 19    End of Session Equipment Utilized During Treatment: Gait belt Activity Tolerance: Patient tolerated treatment well Patient left: in chair;with call bell/phone within reach;with chair alarm set;with family/visitor present Nurse Communication: Mobility status;Other (comment) (Pt's impaired cognition and related safety concerns) PT Visit Diagnosis: Unsteadiness on feet (R26.81);Muscle weakness (generalized) (M62.81)    Time: 0076-2263 PT Time Calculation (min) (ACUTE ONLY): 33 min   Charges:   PT Evaluation $PT Eval Moderate Complexity: 1 Procedure PT Treatments $Gait Training: 8-22 mins   PT G Codes:        Collie Siad PT, DPT 07/13/2016, 4:31 PM

## 2016-07-13 NOTE — Progress Notes (Signed)
Transferred to 254 via bed.

## 2016-07-13 NOTE — Progress Notes (Signed)
PT Cancellation Note  Patient Details Name: Elizabeth Bond MRN: [id number] DOB: Feb 10, 1950   Cancelled Treatment:    Reason Eval/Treat Not Completed: Other (comment).  Pt has just transferred from the ICU to the floor and RN taking vitals.  Additionally, most recent BP 85/56.  Will attempt to see pt later today, schedule permitting, and if pt is medically appropriate.   Collie Siad PT, DPT 07/13/2016, 1:55 PM

## 2016-07-13 NOTE — Progress Notes (Signed)
Initial Nutrition Assessment  DOCUMENTATION CODES:   Non-severe (moderate) malnutrition in context of chronic illness  INTERVENTION:   Magic cup TID with meals, each supplement provides 290 kcal and 9 grams of protein  Snacks  MVI  NUTRITION DIAGNOSIS:   Malnutrition (moderate) related to chronic illness (metastatic breast cancer, COPD) as evidenced by moderate depletions of muscle mass, moderate depletion of body fat.  GOAL:   Patient will meet greater than or equal to 90% of their needs  MONITOR:   PO intake, Labs, Weight trends  REASON FOR ASSESSMENT:   Rounds    ASSESSMENT:   66 y.o. 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   Met with pt in room today. Pt reports intermittent poor appetite pta. Pt reports that some days her appetite is better than others. Pt reports weight gain; chart supports this. Pt does not like any supplements but is willing to try snacks; RD will order. Pt is eating about 50% of her meals in hospital. Palliative care consult pending.   Medications reviewed and include: aspirin, Ca-Vit D, plavix, dexamethasone, lovenox, Mg Oxide, nicotine, protonix   Labs reviewed: BUN 31(H), Ca 8.2(L)  Nutrition-Focused physical exam completed. Findings are moderate fat depletions in arms and chest, moderate muscle depletions in clavicles, hands, shoulders, and temporal regions and no edema.   Diet Order:  Diet Heart Room service appropriate? Yes; Fluid consistency: Thin  Skin:  Reviewed, no issues  Last BM:  7/10  Height:   Ht Readings from Last 1 Encounters:  07/11/16 _0  (1.626 m)    Weight:   Wt Readings from Last 1 Encounters:  07/11/16 126 lb 8.7 oz (57.4 kg)    Ideal Body  Weight:  54.5 kg  BMI:  Body mass index is 21.72 kg/m.  Estimated Nutritional Needs:   Kcal:  1600-1800kcal/day   Protein:  74-86g//day   Fluid:  >1.6L/day   EDUCATION NEEDS:   No education needs identified at this time  Koleen Distance MS, RD, [place] Pager #[phone] After Hours Pager: [phone]

## 2016-07-13 NOTE — Care Management (Addendum)
Transferred out of ICU after stay for stemi. Palliative care consult pending in patient with metastatic breast cancer.  Receiving radiation for brain lesions.  Also found to have lesions in the bone.

## 2016-07-14 DIAGNOSIS — Z515 Encounter for palliative care: Secondary | ICD-10-CM

## 2016-07-14 DIAGNOSIS — R51 Headache: Secondary | ICD-10-CM

## 2016-07-14 DIAGNOSIS — G8929 Other chronic pain: Secondary | ICD-10-CM

## 2016-07-14 LAB — ACID FAST CULTURE WITH REFLEXED SENSITIVITIES (MYCOBACTERIA)

## 2016-07-14 LAB — ACID FAST CULTURE WITH REFLEXED SENSITIVITIES: ACID FAST CULTURE - AFSCU3: NEGATIVE

## 2016-07-14 MED ORDER — LISINOPRIL 5 MG PO TABS: 5.0000 mg | ORAL_TABLET | Freq: Every day | ORAL | Status: DC

## 2016-07-14 MED ORDER — METOPROLOL TARTRATE 25 MG PO TABS: 12.5000 mg | ORAL_TABLET | Freq: Two times a day (BID) | ORAL | 0 refills | Status: AC

## 2016-07-14 MED ORDER — HEPARIN SOD (PORK) LOCK FLUSH 100 UNIT/ML IV SOLN
500.0000 [IU] | Freq: Once | INTRAVENOUS | Status: AC
  Administered 2016-07-14: 500 [IU] via INTRAVENOUS
  Filled 2016-07-14: qty 5

## 2016-07-14 MED ORDER — ATORVASTATIN CALCIUM 80 MG PO TABS: 80.0000 mg | ORAL_TABLET | Freq: Every day | ORAL | 0 refills | Status: AC

## 2016-07-14 MED ORDER — CLOPIDOGREL BISULFATE 75 MG PO TABS: 75.0000 mg | ORAL_TABLET | Freq: Every day | ORAL | 0 refills | Status: AC

## 2016-07-14 MED ORDER — ASPIRIN 81 MG PO CHEW: 81.0000 mg | CHEWABLE_TABLET | Freq: Every day | ORAL | 0 refills | Status: AC

## 2016-07-14 MED ORDER — LISINOPRIL 5 MG PO TABS: 5.0000 mg | ORAL_TABLET | Freq: Every day | ORAL | 0 refills | Status: AC

## 2016-07-14 NOTE — Progress Notes (Signed)
New referral for Home Palliative services received from [facility]. Patient information faxed to referral. Thank you. Flo Shanks RN, BSN, [place] and [facility] hospital Liaison [phone] c

## 2016-07-14 NOTE — Evaluation (Signed)
Occupational Therapy Evaluation Patient Details Name: Elizabeth Bond MRN: [id number] DOB: Sep 19, 1950 Today's Date: 07/14/2016    History of Present Illness Pt. is a 66 y.o. female who was admitted to [facility] [place] with a headaches, and STEMI. Pt. PMHx includes: metastatic Breast CA with recent mets to the brain, COPD, IBS, GERD, Depression, arthritis, and seizures.   Clinical Impression   Pt. Is a 67 y.o. Female who was admitted to [facility] with headaches, and STEMI. Pt. Has a history of metastatic breast CA, with mets to the brain. Pt. Presents with impaired safety awareness, and judgement, memory, and orientation which could hinder ADL, and IADL functioning, and impact home safety. Pt. Could benefit from 24 hour supervision, and functional assist as needed upon return home. Pt. Could benefit from follow-up OT services for ADL training, UE there. Ex, work simplification techniques, cognitive compensatory strategies, safety/judgement, and pt./caregiver education about Home modification, and DME. Pt. Plans to return home with family assist upon discharge.    Follow Up Recommendations  Home health OT;Supervision/Assistance - 24 hour    Equipment Recommendations       Recommendations for Other Services       Precautions / Restrictions                                                      ADL either performed or assessed with clinical judgement   ADL Overall ADL's : Needs assistance/impaired Eating/Feeding: Set up   Grooming: Set up   Upper Body Bathing: Set up   Lower Body Bathing: Minimal assistance   Upper Body Dressing : Set up;Minimal assistance   Lower Body Dressing: Minimal assistance               Functional mobility during ADLs: Min guard General ADL Comments: Pt. required verbal cues, and increased time to identify potential home safety hazards with home safety cards.  With cues, pt. was able to identify corrections.     Vision         Perception     Praxis      Pertinent Vitals/Pain Pain Assessment: No/denies pain     Hand Dominance Right   Extremity/Trunk Assessment Upper Extremity Assessment Upper Extremity Assessment: Generalized weakness           Communication Communication Communication: No difficulties   Cognition Arousal/Alertness: Awake/alert Behavior During Therapy: WFL for tasks assessed/performed Overall Cognitive Status: Impaired/Different from baseline Area of Impairment: Orientation;Safety/judgement;Problem solving;Memory                 Orientation Level: Time (year: 2013) Current Attention Level: Selective Memory: Decreased short-term memory Following Commands: Follows one step commands consistently Safety/Judgement: Decreased awareness of safety;Decreased awareness of deficits   Problem Solving: Slow processing General Comments: Pt. presents with impairments in safety awareness and judgement for home management    General Comments       Exercises     Shoulder Instructions      Home Living Family/patient expects to be discharged to:: Private residence Living Arrangements: Other relatives Available Help at Discharge: Available PRN/intermittently;Family Type of Home: House Home Access: Stairs to enter [employer] of Steps: 2   Home Layout: One level     Bathroom Shower/Tub: Walk-in shower;Door         Home Equipment: None;Shower seat;Grab bars - tub/shower;Cane - single point  Prior Functioning/Environment Level of Independence: Independent        Comments: Pt. reports being independent with all ADLs, IADLs including driving, cooking, and meal preparation.         OT Problem List: Decreased strength;Decreased cognition;Decreased safety awareness      OT Treatment/Interventions: Self-care/ADL training;Therapeutic exercise;Patient/family education;Therapeutic activities;Cognitive remediation/compensation;Energy conservation    OT Goals(Current  goals can be found in the care plan section) Acute Rehab OT Goals Patient Stated Goal: To go home OT Goal Formulation: With patient Potential to Achieve Goals: Good  OT Frequency: Min 2X/week   Barriers to D/C:            Co-evaluation              AM-PAC PT "6 Clicks" Daily Activity     Outcome Measure Help from another person eating meals?: None Help from another person taking care of personal grooming?: A Little Help from another person toileting, which includes using toliet, bedpan, or urinal?: A Little Help from another person bathing (including washing, rinsing, drying)?: A Little Help from another person to put on and taking off regular upper body clothing?: A Little Help from another person to put on and taking off regular lower body clothing?: A Little 6 Click Score: 19   End of Session    Activity Tolerance: Patient tolerated treatment well Patient left: in chair;with nursing/sitter in room;with call bell/phone within reach  OT Visit Diagnosis: Muscle weakness (generalized) (M62.81);Cognitive communication deficit (R41.841)                Time: 1030-1055 OT Time Calculation (min): 25 min Charges:  OT General Charges $OT Visit: 1 Procedure OT Evaluation $OT Eval Moderate Complexity: 1 Procedure G-Codes:     Harrel Carina, MS, OTR/L   Harrel Carina, MS, OTR/L 07/14/2016, 12:14 PM

## 2016-07-14 NOTE — Plan of Care (Signed)
Problem: Health Behavior/Discharge Planning: Goal: Ability to manage health-related needs will improve Outcome: Adequate for Discharge Discharge instructions regarding medicatioons, follow up and discharge instructions

## 2016-07-14 NOTE — Care Management (Signed)
Spoke with patient's daughter Donella Stade and agency preference is [facility].  Referral called and accepted. Agency will be able to initiate visit 7/14.

## 2016-07-14 NOTE — Care Management Important Message (Signed)
Important Message  Patient Details  Name: Elizabeth Bond MRN: [id number] Date of Birth: 08-04-1950   Medicare Important Message Given:  Yes Signed IM notice given    Katrina Stack, RN 07/14/2016, 12:17 PM

## 2016-07-14 NOTE — Discharge Summary (Signed)
[facility] NAME: Elizabeth Bond    MR#:  [id number]  DATE OF BIRTH:  Feb 18, 1950  DATE OF ADMISSION:  07/11/2016 ADMITTING PHYSICIAN: Yolonda Kida, MD  DATE OF DISCHARGE: 07/14/2016  PRIMARY CARE PHYSICIAN: Remi Haggard, FNP    ADMISSION DIAGNOSIS:  Bad headache [R51] ST elevation myocardial infarction (STEMI), unspecified artery ([place]) [I21.3] Metastatic breast cancer ([place]) [C50.919] Acute ST elevation myocardial infarction (STEMI) involving right coronary artery without development of Q waves ([place]) [I21.11]  DISCHARGE DIAGNOSIS:  Acute Inferior wall MI status post cardiac catheterization with drug-eluting stent placement and mid RCA Asymptomatic related hypotension SECONDARY DIAGNOSIS:   Past Medical History:  Diagnosis Date  . Arthritis   . Brain metastasis ([place]) 07/06/2016  . Breast cancer ([place])    right  . Cancer ([place]) 2015   breast and bone  . COPD (chronic obstructive pulmonary disease) ([place])   . Depression   . GERD (gastroesophageal reflux disease)   . IBS (irritable bowel syndrome)   . Metastasis to bone [facility]) 09/01/2014    HOSPITAL COURSE:   Elizabeth Bond a 66 y.o. 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  #1 Inferior STEMI -appreciate cards consult with Dr Clayborn Bigness - s/p DES in Mid RCA - on asa, plavix, metoprolol, lisinopril, Lipitor - EF 45% Based on cath.  #2 metastatic breast cancer-recent diagnosis of brain medicines since 2 weeks ago. - Continue Keppra. No further seizures at home. - Patient currently is getting whole brain radiation. Also on Decadron. I have asked daughter to call cancer Center to determine when she could  return for her scheduled radiation -palliative care consulted----appreciate input. Patient will be followed at home by palliative care  #3 anxiety-continue home medications.  #4 GERD-Protonix  #5 DVT prophylaxis patient on Lovenox  Physical therapy recommended home health PT. We'll arrange nurse and home health aide along with palliative care.  This was discussed or phone with patient's daughter Donella Stade. She is agreeable. Discharge patient home.   CONSULTS OBTAINED:    DRUG ALLERGIES:   Allergies  Allergen Reactions  . Fentanyl Palpitations and Other (See Comments)    "loopy and confused" "feels like heart is going to come out of my chest"    DISCHARGE MEDICATIONS:   Current Discharge Medication List    START taking these medications   Details  aspirin 81 MG chewable tablet Chew 1 tablet (81 mg total) by mouth daily. Qty: 30 tablet, Refills: 0    atorvastatin (LIPITOR) 80 MG tablet Take 1 tablet (80 mg total) by mouth daily at 6 PM. Qty: 30 tablet, Refills: 0    clopidogrel (PLAVIX) 75 MG tablet Take 1 tablet (75 mg total) by mouth daily with breakfast. Qty: 30 tablet, Refills: 0    lisinopril (PRINIVIL,ZESTRIL) 5 MG tablet Take 1 tablet (5 mg total) by mouth daily. Qty: 30 tablet, Refills: 0    metoprolol tartrate (LOPRESSOR) 25 MG tablet Take 0.5 tablets (12.5 mg total) by mouth 2 (two) times daily. Qty: 60 tablet, Refills: 0      CONTINUE these medications which have NOT CHANGED   Details  acetaminophen (TYLENOL) 325 MG tablet Take 650 mg by mouth daily as needed for headache.    Calcium  Carb-Cholecalciferol (CALCIUM-VITAMIN D3) 600-400 MG-UNIT TABS Take 1 tablet by mouth 2 (two) times daily.    clonazePAM (KLONOPIN) 0.5 MG tablet TAKE 1 TABLET 3 TIMES DAILY AS NEEDED FOR ANXIETY Qty: 90 tablet, Refills: 1    dexamethasone (DECADRON) 4 MG tablet Take 1 tablet (4 mg total) by mouth 2 (two) times daily with a meal. Qty: 60 tablet, Refills: 0     levETIRAcetam (KEPPRA) 500 MG tablet Take 1 tablet (500 mg total) by mouth 2 (two) times daily. Qty: 60 tablet, Refills: 0    loratadine (CLARITIN) 10 MG tablet Take 10 mg by mouth daily as needed for allergies.    Associated Diagnoses: Malignant neoplasm of right female breast, unspecified site of breast; Metastasis to bone (HCC)    Magnesium 500 MG TABS Take 500 mg by mouth daily.     pantoprazole (PROTONIX) 40 MG tablet Take 1 tablet (40 mg total) by mouth daily. Qty: 30 tablet, Refills: 6   Associated Diagnoses: Malignant neoplasm of right breast in female, estrogen receptor positive, unspecified site of breast ([place]); Metastasis to bone [facility]); Elevated bilirubin    phosphorus (K PHOS NEUTRAL) 155-852-130 MG tablet Take 1 tablet (250 mg total) by mouth 2 (two) times daily. Qty: 60 tablet, Refills: 2   Associated Diagnoses: Metastasis to bone ([place]); Malignant neoplasm of right breast in female, estrogen receptor positive, unspecified site of breast ([place]); Elevated bilirubin    potassium chloride (K-DUR) 10 MEQ tablet TAKE ONE TABLET BY MOUTH EVERY DAY Qty: 30 tablet, Refills: 2   Associated Diagnoses: Metastasis to bone ([place]); Encounter for antineoplastic chemotherapy; Malignant neoplasm of right breast in female, estrogen receptor positive, unspecified site of breast ([place]); Elevated bilirubin    temazepam (RESTORIL) 15 MG capsule TAKE 1 CAPSULE AT BEDTIME AS NEEDED FOR SLEEP Qty: 30 capsule, Refills: 1   Associated Diagnoses: Malignant neoplasm of right breast in female, estrogen receptor positive, unspecified site of breast ([place]); Metastasis to bone [facility]); Elevated bilirubin; Encounter for antineoplastic chemotherapy; Hypomagnesemia; Hypokalemia; Hypocalcemia        If you experience worsening of your admission symptoms, develop shortness of breath, life threatening emergency, suicidal or homicidal thoughts you must seek medical attention immediately by calling 911 or calling your MD  immediately  if symptoms less severe.  You Must read complete instructions/literature along with all the possible adverse reactions/side effects for all the Medicines you take and that have been prescribed to you. Take any new Medicines after you have completely understood and accept all the possible adverse reactions/side effects.   Please note  You were cared for by a hospitalist during your hospital stay. If you have any questions about your discharge medications or the care you received while you were in the hospital after you are discharged, you can call the unit and asked to speak with the hospitalist on call if the hospitalist that took care of you is not available. Once you are discharged, your primary care physician will handle any further medical issues. Please note that NO REFILLS for any discharge medications will be authorized once you are discharged, as it is imperative that you return to your primary care physician (or establish a relationship with a primary care physician if you do not have one) for your aftercare needs so that they can reassess your need for medications and monitor your lab values. Today   SUBJECTIVE   Doing well no complaints Patient wants to go home  VITAL SIGNS:  Blood pressure 102/61, pulse 68,  temperature 97.7 F (36.5 C), temperature source Oral, resp. rate 18, height 5\' 4"  (1.626 m), weight 57.4 kg (126 lb 8.7 oz), SpO2 99 %.  I/O:   Intake/Output Summary (Last 24 hours) at 07/14/16 1051 Last data filed at 07/14/16 0941  Gross per 24 hour  Intake              600 ml  Output              100 ml  Net              500 ml    PHYSICAL EXAMINATION:  GENERAL:  66 y.o.-year-old patient lying in the bed with no acute distress.  EYES: Pupils equal, round, reactive to light and accommodation. No scleral icterus. Extraocular muscles intact.  HEENT: Head atraumatic, normocephalic. Oropharynx and nasopharynx clear.  NECK:  Supple, no jugular venous  distention. No thyroid enlargement, no tenderness.  LUNGS: Normal breath sounds bilaterally, no wheezing, rales,rhonchi or crepitation. No use of accessory muscles of respiration.  CARDIOVASCULAR: S1, S2 normal. No murmurs, rubs, or gallops.  ABDOMEN: Soft, non-tender, non-distended. Bowel sounds present. No organomegaly or mass.  EXTREMITIES: No pedal edema, cyanosis, or clubbing.  NEUROLOGIC: Cranial nerves II through XII are intact. Muscle strength 5/5 in all extremities. Sensation intact. Gait not checked.  PSYCHIATRIC: The patient is alert and oriented x 3.  SKIN: No obvious rash, lesion, or ulcer.   DATA REVIEW:   CBC   Recent Labs Lab 07/12/16 0415  WBC 6.1  HGB 13.6  HCT 39.7  PLT 112*    Chemistries   Recent Labs Lab 07/12/16 0415  NA 139  K 4.4  CL 107  CO2 27  GLUCOSE 89  BUN 31*  CREATININE 0.58  CALCIUM 8.2*    Microbiology Results   Recent Results (from the past 240 hour(s))  MRSA PCR Screening     Status: None   Collection Time: 07/11/16  4:12 PM  Result Value Ref Range Status   MRSA by PCR NEGATIVE NEGATIVE Final    Comment:        The GeneXpert MRSA Assay (FDA approved for NASAL specimens only), is one component of a comprehensive MRSA colonization surveillance program. It is not intended to diagnose MRSA infection nor to guide or monitor treatment for MRSA infections.     RADIOLOGY:  No results found.   Management plans discussed with the patient, family and they are in agreement.  CODE STATUS:     Code Status Orders        Start     Ordered   07/13/16 1636  Do not attempt resuscitation (DNR)  Continuous    Question Answer Comment  In the event of cardiac or respiratory ARREST Do not call a "code blue"   In the event of cardiac or respiratory ARREST Do not perform Intubation, CPR, defibrillation or ACLS   In the event of cardiac or respiratory ARREST Use medication by any route, position, wound care, and other measures to  relive pain and suffering. May use oxygen, suction and manual treatment of airway obstruction as needed for comfort.      07/13/16 1635    Code Status History    Date Active Date Inactive Code Status Order ID Comments User Context   07/11/2016  4:38 PM 07/13/2016  4:35 PM Full Code [id number]  Gladstone Lighter, MD Inpatient   07/11/2016  4:33 PM 07/11/2016  4:37 PM Full Code [id number]  Yolonda Kida, MD  Inpatient   06/28/2016  8:43 PM 06/29/2016  5:14 PM DNR [id number]  Vaughan Basta, MD Inpatient   06/28/2016  6:24 PM 06/28/2016  8:43 PM DNR [id number]  Vaughan Basta, MD ED   12/23/2014  3:41 AM 12/24/2014  2:31 PM Full Code [id number]  Harrie Foreman, MD Inpatient    Advance Directive Documentation     Most Recent Value  Type of Advance Directive  Out of facility DNR (pink MOST or yellow form)  Pre-existing out of facility DNR order (yellow form or pink MOST form)  -  "MOST" Form in Place?  -      TOTAL TIME TAKING CARE OF THIS PATIENT: 40 minutes.    Rashod Gougeon M.D on 07/14/2016 at 10:51 AM  Between 7am to 6pm - Pager - [phone] After 6pm go to [email or web address] - password [email or web address]  Office  [phone]  CC: Primary care physician; Remi Haggard, FNP

## 2016-07-15 DIAGNOSIS — J449 Chronic obstructive pulmonary disease, unspecified: Secondary | ICD-10-CM | POA: Diagnosis not present

## 2016-07-15 DIAGNOSIS — F419 Anxiety disorder, unspecified: Secondary | ICD-10-CM | POA: Diagnosis not present

## 2016-07-15 DIAGNOSIS — F17210 Nicotine dependence, cigarettes, uncomplicated: Secondary | ICD-10-CM | POA: Diagnosis not present

## 2016-07-15 DIAGNOSIS — R569 Unspecified convulsions: Secondary | ICD-10-CM | POA: Diagnosis not present

## 2016-07-15 DIAGNOSIS — E440 Moderate protein-calorie malnutrition: Secondary | ICD-10-CM | POA: Diagnosis not present

## 2016-07-15 DIAGNOSIS — I2119 ST elevation (STEMI) myocardial infarction involving other coronary artery of inferior wall: Secondary | ICD-10-CM | POA: Diagnosis not present

## 2016-07-15 DIAGNOSIS — Z85830 Personal history of malignant neoplasm of bone: Secondary | ICD-10-CM | POA: Diagnosis not present

## 2016-07-15 DIAGNOSIS — C7931 Secondary malignant neoplasm of brain: Secondary | ICD-10-CM | POA: Diagnosis not present

## 2016-07-15 DIAGNOSIS — K219 Gastro-esophageal reflux disease without esophagitis: Secondary | ICD-10-CM | POA: Diagnosis not present

## 2016-07-15 DIAGNOSIS — F329 Major depressive disorder, single episode, unspecified: Secondary | ICD-10-CM | POA: Diagnosis not present

## 2016-07-15 DIAGNOSIS — M1991 Primary osteoarthritis, unspecified site: Secondary | ICD-10-CM | POA: Diagnosis not present

## 2016-07-15 DIAGNOSIS — Z853 Personal history of malignant neoplasm of breast: Secondary | ICD-10-CM | POA: Diagnosis not present

## 2016-07-17 ENCOUNTER — Ambulatory Visit
Admission: RE | Admit: 2016-07-17 | Discharge: 2016-07-17 | Disposition: A | Discharge status: Home / Self Care | Payer: Medicare Other | Source: Ambulatory Visit | Attending: Radiation Oncology | Admitting: Radiation Oncology

## 2016-07-17 DIAGNOSIS — Z510 Encounter for antineoplastic radiation therapy: Secondary | ICD-10-CM | POA: Diagnosis not present

## 2016-07-17 DIAGNOSIS — C7931 Secondary malignant neoplasm of brain: Secondary | ICD-10-CM | POA: Diagnosis not present

## 2016-07-18 ENCOUNTER — Ambulatory Visit
Admission: RE | Admit: 2016-07-18 | Discharge: 2016-07-18 | Disposition: A | Discharge status: Home / Self Care | Payer: Medicare Other | Source: Ambulatory Visit | Attending: Radiation Oncology | Admitting: Radiation Oncology

## 2016-07-18 DIAGNOSIS — C719 Malignant neoplasm of brain, unspecified: Secondary | ICD-10-CM | POA: Diagnosis not present

## 2016-07-18 DIAGNOSIS — M1991 Primary osteoarthritis, unspecified site: Secondary | ICD-10-CM | POA: Diagnosis not present

## 2016-07-18 DIAGNOSIS — C7931 Secondary malignant neoplasm of brain: Secondary | ICD-10-CM | POA: Diagnosis not present

## 2016-07-18 DIAGNOSIS — J449 Chronic obstructive pulmonary disease, unspecified: Secondary | ICD-10-CM | POA: Diagnosis not present

## 2016-07-18 DIAGNOSIS — E440 Moderate protein-calorie malnutrition: Secondary | ICD-10-CM | POA: Diagnosis not present

## 2016-07-18 DIAGNOSIS — R569 Unspecified convulsions: Secondary | ICD-10-CM | POA: Diagnosis not present

## 2016-07-18 DIAGNOSIS — Z510 Encounter for antineoplastic radiation therapy: Secondary | ICD-10-CM | POA: Diagnosis not present

## 2016-07-18 DIAGNOSIS — I2119 ST elevation (STEMI) myocardial infarction involving other coronary artery of inferior wall: Secondary | ICD-10-CM | POA: Diagnosis not present

## 2016-07-19 ENCOUNTER — Ambulatory Visit: Payer: Medicare Other

## 2016-07-19 ENCOUNTER — Other Ambulatory Visit: Payer: Self-pay | Admitting: *Deleted

## 2016-07-19 ENCOUNTER — Telehealth: Payer: Self-pay | Admitting: *Deleted

## 2016-07-19 DIAGNOSIS — I2119 ST elevation (STEMI) myocardial infarction involving other coronary artery of inferior wall: Secondary | ICD-10-CM | POA: Diagnosis not present

## 2016-07-19 DIAGNOSIS — C7931 Secondary malignant neoplasm of brain: Secondary | ICD-10-CM | POA: Diagnosis not present

## 2016-07-19 DIAGNOSIS — Z853 Personal history of malignant neoplasm of breast: Secondary | ICD-10-CM

## 2016-07-19 DIAGNOSIS — E440 Moderate protein-calorie malnutrition: Secondary | ICD-10-CM | POA: Diagnosis not present

## 2016-07-19 DIAGNOSIS — M1991 Primary osteoarthritis, unspecified site: Secondary | ICD-10-CM | POA: Diagnosis not present

## 2016-07-19 DIAGNOSIS — J449 Chronic obstructive pulmonary disease, unspecified: Secondary | ICD-10-CM | POA: Diagnosis not present

## 2016-07-19 DIAGNOSIS — R569 Unspecified convulsions: Secondary | ICD-10-CM | POA: Diagnosis not present

## 2016-07-19 NOTE — Telephone Encounter (Signed)
   OK for palliative care consult.  M

## 2016-07-19 NOTE — Telephone Encounter (Signed)
Hospital ordered a Palliative Care consult. Asking if Dr Mike Gip will fax orders if in agreement with referral.

## 2016-07-20 ENCOUNTER — Inpatient Hospital Stay: Payer: Medicare Other

## 2016-07-20 ENCOUNTER — Ambulatory Visit
Admission: RE | Admit: 2016-07-20 | Discharge: 2016-07-20 | Disposition: A | Discharge status: Home / Self Care | Payer: Medicare Other | Source: Ambulatory Visit | Attending: Radiation Oncology | Admitting: Radiation Oncology

## 2016-07-20 ENCOUNTER — Ambulatory Visit: Payer: Medicare Other

## 2016-07-20 ENCOUNTER — Ambulatory Visit
Admission: RE | Admit: 2016-07-20 | Discharge: 2016-07-20 | Disposition: A | Discharge status: Home / Self Care | Payer: Medicare Other | Source: Ambulatory Visit | Attending: Hematology and Oncology | Admitting: Hematology and Oncology

## 2016-07-20 ENCOUNTER — Encounter: Payer: Self-pay | Admitting: Hematology and Oncology

## 2016-07-20 ENCOUNTER — Telehealth: Payer: Self-pay | Admitting: *Deleted

## 2016-07-20 ENCOUNTER — Inpatient Hospital Stay: Payer: Medicare Other | Admitting: *Deleted

## 2016-07-20 ENCOUNTER — Other Ambulatory Visit: Payer: Self-pay | Admitting: *Deleted

## 2016-07-20 ENCOUNTER — Inpatient Hospital Stay (HOSPITAL_BASED_OUTPATIENT_CLINIC_OR_DEPARTMENT_OTHER): Payer: Medicare Other | Admitting: Hematology and Oncology

## 2016-07-20 VITALS — BP 108/70 | HR 72 | Temp 98.1°F | Resp 18 | Wt 129.0 lb

## 2016-07-20 DIAGNOSIS — R600 Localized edema: Secondary | ICD-10-CM

## 2016-07-20 DIAGNOSIS — Z79899 Other long term (current) drug therapy: Secondary | ICD-10-CM | POA: Diagnosis not present

## 2016-07-20 DIAGNOSIS — F329 Major depressive disorder, single episode, unspecified: Secondary | ICD-10-CM | POA: Diagnosis not present

## 2016-07-20 DIAGNOSIS — D890 Polyclonal hypergammaglobulinemia: Secondary | ICD-10-CM

## 2016-07-20 DIAGNOSIS — Z66 Do not resuscitate: Secondary | ICD-10-CM | POA: Diagnosis not present

## 2016-07-20 DIAGNOSIS — C50911 Malignant neoplasm of unspecified site of right female breast: Secondary | ICD-10-CM

## 2016-07-20 DIAGNOSIS — F17210 Nicotine dependence, cigarettes, uncomplicated: Secondary | ICD-10-CM | POA: Diagnosis not present

## 2016-07-20 DIAGNOSIS — Z170 Estrogen receptor positive status [ER+]: Secondary | ICD-10-CM | POA: Diagnosis not present

## 2016-07-20 DIAGNOSIS — K219 Gastro-esophageal reflux disease without esophagitis: Secondary | ICD-10-CM | POA: Diagnosis not present

## 2016-07-20 DIAGNOSIS — Z853 Personal history of malignant neoplasm of breast: Secondary | ICD-10-CM

## 2016-07-20 DIAGNOSIS — C7951 Secondary malignant neoplasm of bone: Secondary | ICD-10-CM

## 2016-07-20 DIAGNOSIS — C7931 Secondary malignant neoplasm of brain: Secondary | ICD-10-CM

## 2016-07-20 DIAGNOSIS — J449 Chronic obstructive pulmonary disease, unspecified: Secondary | ICD-10-CM | POA: Diagnosis not present

## 2016-07-20 DIAGNOSIS — K589 Irritable bowel syndrome without diarrhea: Secondary | ICD-10-CM | POA: Diagnosis not present

## 2016-07-20 DIAGNOSIS — E876 Hypokalemia: Secondary | ICD-10-CM | POA: Diagnosis not present

## 2016-07-20 DIAGNOSIS — C50919 Malignant neoplasm of unspecified site of unspecified female breast: Secondary | ICD-10-CM

## 2016-07-20 DIAGNOSIS — R569 Unspecified convulsions: Secondary | ICD-10-CM | POA: Diagnosis not present

## 2016-07-20 DIAGNOSIS — R319 Hematuria, unspecified: Secondary | ICD-10-CM

## 2016-07-20 DIAGNOSIS — I219 Acute myocardial infarction, unspecified: Secondary | ICD-10-CM | POA: Diagnosis not present

## 2016-07-20 DIAGNOSIS — R17 Unspecified jaundice: Secondary | ICD-10-CM

## 2016-07-20 DIAGNOSIS — R945 Abnormal results of liver function studies: Secondary | ICD-10-CM

## 2016-07-20 DIAGNOSIS — R7989 Other specified abnormal findings of blood chemistry: Secondary | ICD-10-CM

## 2016-07-20 DIAGNOSIS — Z510 Encounter for antineoplastic radiation therapy: Secondary | ICD-10-CM | POA: Diagnosis not present

## 2016-07-20 LAB — URINALYSIS, COMPLETE (UACMP) WITH MICROSCOPIC
Bacteria, UA: NONE SEEN
Bilirubin Urine: NEGATIVE
Glucose, UA: NEGATIVE mg/dL
Ketones, ur: NEGATIVE mg/dL
Leukocytes, UA: NEGATIVE
Nitrite: NEGATIVE
Protein, ur: NEGATIVE mg/dL
Specific Gravity, Urine: 1.029 (ref 1.005–1.030)
Squamous Epithelial / LPF: NONE SEEN
pH: 5 (ref 5.0–8.0)

## 2016-07-20 LAB — CBC WITH DIFFERENTIAL/PLATELET
Basophils Absolute: 0 10*3/uL (ref 0–0.1)
Basophils Relative: 1 %
Eosinophils Absolute: 0 10*3/uL (ref 0–0.7)
Eosinophils Relative: 0 %
HCT: 39.2 % (ref 35.0–47.0)
Hemoglobin: 13.4 g/dL (ref 12.0–16.0)
Lymphocytes Relative: 4 %
Lymphs Abs: 0.3 10*3/uL — ABNORMAL LOW (ref 1.0–3.6)
MCH: 33.4 pg (ref 26.0–34.0)
MCHC: 34.1 g/dL (ref 32.0–36.0)
MCV: 98 fL (ref 80.0–100.0)
Monocytes Absolute: 0.3 10*3/uL (ref 0.2–0.9)
Monocytes Relative: 5 %
Neutro Abs: 5.5 10*3/uL (ref 1.4–6.5)
Neutrophils Relative %: 90 %
Platelets: 90 10*3/uL — ABNORMAL LOW (ref 150–440)
RBC: 4 MIL/uL (ref 3.80–5.20)
RDW: 19 % — ABNORMAL HIGH (ref 11.5–14.5)
WBC: 6.2 10*3/uL (ref 3.6–11.0)

## 2016-07-20 LAB — COMPREHENSIVE METABOLIC PANEL
ALT: 148 U/L — ABNORMAL HIGH (ref 14–54)
AST: 115 U/L — ABNORMAL HIGH (ref 15–41)
Albumin: 2.5 g/dL — ABNORMAL LOW (ref 3.5–5.0)
Alkaline Phosphatase: 157 U/L — ABNORMAL HIGH (ref 38–126)
Anion gap: 8 (ref 5–15)
BUN: 34 mg/dL — ABNORMAL HIGH (ref 6–20)
CO2: 24 mmol/L (ref 22–32)
Calcium: 8 mg/dL — ABNORMAL LOW (ref 8.9–10.3)
Chloride: 106 mmol/L (ref 101–111)
Creatinine, Ser: 0.77 mg/dL (ref 0.44–1.00)
GFR calc Af Amer: >60 mL/min (ref >60)
GFR calc non Af Amer: >60 mL/min (ref >60)
Glucose, Bld: 94 mg/dL (ref 65–99)
Potassium: 3.7 mmol/L (ref 3.5–5.1)
Sodium: 138 mmol/L (ref 135–145)
Total Bilirubin: 1.4 mg/dL — ABNORMAL HIGH (ref 0.3–1.2)
Total Protein: 5.7 g/dL — ABNORMAL LOW (ref 6.5–8.1)

## 2016-07-20 LAB — MAGNESIUM: Magnesium: 2 mg/dL (ref 1.7–2.4)

## 2016-07-20 MED ORDER — TEMAZEPAM 15 MG PO CAPS: ORAL_CAPSULE | ORAL | 1 refills | Status: AC

## 2016-07-20 NOTE — Telephone Encounter (Signed)
Daughter called and informed that the patients lft's were elevated and that Dr. Mike Gip wanted to do extra labs tomorrow and an abd ultrasound. Voiced understanding.

## 2016-07-20 NOTE — Progress Notes (Signed)
Patient is having blood in her urine and stool.  Also c/o having pain in lower abdomen at times. Also pain in back and legs at times.  She presents today in a wheelchair, accompanied by her daughter and son-in-law.  States she is very weak.  Requesting refill for temazepam.  Patient is up at 3 am drinking coffee every morning.  Also requesting prescription for 3 in 1 (bedside commode).

## 2016-07-20 NOTE — Telephone Encounter (Signed)
Ultrasound called and reported that bilateral doplers were negative for DVT to Honor Loh NP, Dr. Mike Gip informed.

## 2016-07-20 NOTE — Progress Notes (Signed)
[facility] [place] day: 07/20/2016  Chief Complaint: MAN BONNEAU is a 66 y.o. female with metastatic Her2/neu positive right breast cancer who is seen for reassessment.  HPI:  The patient was last seen in the medical oncology clinic by me on 06/23/2016.  At that time, we discussed the unclear etiology of the cavitary pulmonary lesions.  She had undergone a through pulmonary evaluation.  She felt good.  She wished to continue a chemotherapy holiday.  She was admitted to [facility] from 06/28/2016 - 06/29/2016 with a seizure and encephalopathy.  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 There was extensive vasogenic edema surrounding the larger lesions of the bilateral frontal lobes and left parietal occipital lesions. There was no midline shift or hydrocephalus.  She was started on Keppra and Decadron.  She was seen by Dr. Baruch Gouty.  Plan was for whole brain radiation delivering 3000 cGy in 10 fractions.  She began radiation on 07/06/2016.  Last treatment is on 07/26/2016.  She was admitted to [facility] from 07/11/2016 - 07/14/2016 with headache and an acute inferior wall MI.  She underwent cardiac catheterization.  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  A drug eluting stent was placed in the mid RCA.  EF was 45-50%.  She was discharged on aspirin, Plavix, lisinopril, and Lipitor.  Symptomatically, she notes poor sleep. She is up 2:00- 3:00 AM and drinks coffee. She is smoking 1 pack a day. She is on Decadron 4 mg twice a day. Taper starts next week.  She is walking "okay".  Legs are weak. She needs assistance in the bathroom.  She has some issues with continence. She spends her day watching TV, reading and eating. She tries to keep her legs up. She is able to dress on her on an showering is "okay".   Past Medical History:  Diagnosis Date   . Arthritis   . Brain metastasis ([place]) 07/06/2016  . Breast cancer ([place])    right  . Cancer ([place]) 2015   breast and bone  . COPD (chronic obstructive pulmonary disease) ([place])   . Depression   . GERD (gastroesophageal reflux disease)   . IBS (irritable bowel syndrome)   . Metastasis to bone ([place]) 09/01/2014    Past Surgical History:  Procedure Laterality Date  . ABDOMINAL HYSTERECTOMY  1978  . CATARACT EXTRACTION W/PHACO Right 06/06/2016   Procedure: CATARACT EXTRACTION PHACO AND INTRAOCULAR LENS PLACEMENT (IOC);  Surgeon: Birder Robson, MD;  Location: [facility] ORS;  Service: Ophthalmology;  Laterality: Right;  [place] 00:51.1 AP% 14.0 CDE 7.16 Fluid pack lot # [id number]  . CATARACT EXTRACTION W/PHACO Left 06/27/2016   Procedure: CATARACT EXTRACTION PHACO AND INTRAOCULAR LENS PLACEMENT (IOC);  Surgeon: Birder Robson, MD;  Location: [facility] ORS;  Service: Ophthalmology;  Laterality: Left;  [place] 00:37 AP% 19.1 CDE  7.11 Fluid pack lot # [id number]  . CORONARY/GRAFT ACUTE MI REVASCULARIZATION N/A 07/11/2016   Procedure: Coronary/Graft Acute MI Revascularization;  Surgeon: Yolonda Kida, MD;  Location: [place] [facility];  Service: Cardiovascular;  Laterality: N/A;  . FLEXIBLE BRONCHOSCOPY N/A 05/31/2016   Procedure: FLEXIBLE BRONCHOSCOPY;  Surgeon: Wilhelmina Mcardle, MD;  Location: [facility] ORS;  Service: Pulmonary;  Laterality: N/A;  . JOINT REPLACEMENT    . LEFT HEART CATH AND CORONARY ANGIOGRAPHY N/A 07/11/2016   Procedure: Left Heart Cath and Coronary Angiography;  Surgeon: Yolonda Kida, MD;  Location: [place] [facility];  Service: Cardiovascular;  Laterality: N/A;  . PORTACATH PLACEMENT  2015  . REPLACEMENT TOTAL KNEE Left 2004-2005?    Family History  Problem Relation Age of Onset  . Cancer Father        prostate  . Cancer Mother        Pancreatic  . Cancer Maternal Aunt        breast  . Cancer Cousin        breast  . Cancer Other        lung cancer - neice  . Cancer -  Other Daughter        brain    Social History:  reports that she has been smoking Cigarettes.  She has a 10.00 pack-year smoking history. She has never used smokeless tobacco. She reports that she does not drink alcohol or use drugs.  Her daughter's name is IT trainer.  She lives in [place].  The patient is accompanied by her daughter and son-in-law, Ronalee Belts, today.  Allergies:  Allergies  Allergen Reactions  . Fentanyl Palpitations and Other (See Comments)    "loopy and confused" "feels like heart is going to come out of my chest"    Current Medications: Current Outpatient Prescriptions  Medication Sig Dispense Refill  . acetaminophen (TYLENOL) 325 MG tablet Take 650 mg by mouth daily as needed for headache.    Marland Kitchen aspirin 81 MG chewable tablet Chew 1 tablet (81 mg total) by mouth daily. 30 tablet 0  . atorvastatin (LIPITOR) 80 MG tablet Take 1 tablet (80 mg total) by mouth daily at 6 PM. 30 tablet 0  . Calcium Carb-Cholecalciferol (CALCIUM-VITAMIN D3) 600-400 MG-UNIT TABS Take 1 tablet by mouth 2 (two) times daily.    . clonazePAM (KLONOPIN) 0.5 MG tablet TAKE 1 TABLET 3 TIMES DAILY AS NEEDED FOR ANXIETY 90 tablet 1  . clopidogrel (PLAVIX) 75 MG tablet Take 1 tablet (75 mg total) by mouth daily with breakfast. 30 tablet 0  . dexamethasone (DECADRON) 4 MG tablet Take 1 tablet (4 mg total) by mouth 2 (two) times daily with a meal. 60 tablet 0  . levETIRAcetam (KEPPRA) 500 MG tablet Take 1 tablet (500 mg total) by mouth 2 (two) times daily. 60 tablet 0  . lisinopril (PRINIVIL,ZESTRIL) 5 MG tablet Take 1 tablet (5 mg total) by mouth daily. 30 tablet 0  . loratadine (CLARITIN) 10 MG tablet Take 10 mg by mouth daily as needed for allergies.     . Magnesium 500 MG TABS Take 500 mg by mouth daily.     . metoprolol tartrate (LOPRESSOR) 25 MG tablet Take 0.5 tablets (12.5 mg total) by mouth 2 (two) times daily. 60 tablet 0  . pantoprazole (PROTONIX) 40 MG tablet Take 1 tablet (40 mg total) by mouth  daily. 30 tablet 6  . phosphorus (K PHOS NEUTRAL) 155-852-130 MG tablet Take 1 tablet (250 mg total) by mouth 2 (two) times daily. 60 tablet 2  . potassium chloride (K-DUR) 10 MEQ tablet TAKE ONE TABLET BY MOUTH EVERY DAY 30 tablet 2  . temazepam (RESTORIL) 15 MG capsule TAKE 1 CAPSULE AT BEDTIME AS NEEDED FOR SLEEP 30 capsule 1   No current facility-administered medications for this visit.     Review of Systems:  GENERAL:  Fatigue.  No fevers or sweats.  Weight up 13 pound. PERFORMANCE STATUS (ECOG):  3 HEENT:  No visual changes, runny nose, sore throat, mouth sores or tenderness. Lungs: No shortness of breath.  Chronic cough.  No hemoptysis. Cardiac:  No chest pain, palpitations, orthopnea, or PND.  Blood pressure up as worried about scans GI:  No nausea, vomiting, diarrhea, constipation, melena or hematochezia. GU:  Continence issues.  No urgency, frequency, dysuria, or hematuria. Musculoskeletal:  No shoulder pain. No back pain.  No joint pain.  No muscle tenderness. Extremities:  Lower extremity swelling.  No pain. Skin:  No rashes or skin changes. Neuro:  General weakness.  No seizures.  No headache, numbness or weakness, balance or coordination issues. Endocrine:  No diabetes, thyroid issues, hot flashes or night sweats. Psych:  Insomnia improved on Restoril.  Drinks coffee at night.  No mood changes, depression or anxiety. Pain:  No focal pain.   Review of systems:  All other systems reviewed and found to be negative.   Physical Exam: Blood pressure 108/70, pulse 72, temperature 98.1 F (36.7 C), temperature source Tympanic, resp. rate 18, weight 129 lb (58.5 kg). GENERAL:  Chronically fatigued woman sitting comfortably in a wheelchair in the exam room in no acute distress. MENTAL STATUS:  Alert and oriented to person, place and time. HEAD:  Short styled gray hair.  Normocephalic, atraumatic, face symmetric, no Cushingoid features. EYES: Hazel eyes.  Pupils equal round and  reactive to light and accomodation.  No conjunctivitis or scleral icterus. ENT:  Oropharynx clear without lesion.  Tongue normal. Mucous membranes moist.  RESPIRATORY:  Clear to auscultation without rales, wheezes or rhonchi. CARDIOVASCULAR:  Regular rate and rhythm without murmur, rub or gallop.  No JVD. ABDOMEN:  Soft, non-tender, with active bowel sounds, and no hepatosplenomegaly.  No masses. SKIN:  No rashes, ulcers or lesions. EXTREMITIES: 3+ edema to knees bilaterally.  No skin discoloration or tenderness.  No palpable cords. LYMPH NODES: No palpable cervical, supraclavicular, axillary or inguinal adenopathy  NEUROLOGICAL:  Lower extremity strength decreased proximally (left > right).  Sensation intact.  Patellar reflexes symmetric. PSYCH:  Appropriate.   Imaging studies: 08/28/2014:  PET scan revealed significant interval worsening and multifocal osseous metastatic disease. Lesions had increased in number and metabolic activity. There were no extra osseous metastasis. 02/08/2015:  PET scan revealed a complete metabolic response in the sclerotic osseous metastases, with no residual hypermetabolic metastatic disease.   08/23/2015:  PET scan revealed no new or progressive hypermetabolic metastatic disease.  07/30/2015:  Bone scan revealed multifocal osseous metastases (sternum, thoracolumbar spine, multiple ribs, and right iliac crest).  The dominant left acetabular metastasis noted on prior PET was not evident. Some of the other sites of disease appear to have progressed.  There was no prior bone scan for comparison. 08/23/2015:  PET scan revealed no new or progressive hypermetabolic disease.  There was stable mildly hypermetabolic sclerotic metastasis in the right proximal femoral metaphysis.  There was stable nonspecific mild patchy hypermetabolism in the upper and lower spine correlating with mixed lytic and sclerotic osseous metastases in the CT images 02/16/2016:  Bone scan revealed  multiple osseous metastasis with distribution of metastasis similar to that seen on the previous exam. 05/19/2016:  PET scan revealed similar appearance of osseous metastasis with mild decrease in the index lesion hypermetabolism.  There was no evidence of soft tissue metastasis.  There was the development of innumerable, partially cavitary upper lobe predominant pulmonary nodules.    Clinical Support on 07/20/2016  Component Date Value Ref Range Status  . Sodium 07/20/2016 138  135 - 145 mmol/L Final  . Potassium 07/20/2016 3.7  3.5 - 5.1 mmol/L Final  .  Chloride 07/20/2016 106  101 - 111 mmol/L Final  . CO2 07/20/2016 24  22 - 32 mmol/L Final  . Glucose, Bld 07/20/2016 94  65 - 99 mg/dL Final  . BUN 07/20/2016 34* 6 - 20 mg/dL Final  . Creatinine, Ser 07/20/2016 0.77  0.44 - 1.00 mg/dL Final  . Calcium 07/20/2016 8.0* 8.9 - 10.3 mg/dL Final  . Total Protein 07/20/2016 5.7* 6.5 - 8.1 g/dL Final  . Albumin 07/20/2016 2.5* 3.5 - 5.0 g/dL Final  . AST 07/20/2016 115* 15 - 41 U/L Final  . ALT 07/20/2016 148* 14 - 54 U/L Final  . Alkaline Phosphatase 07/20/2016 157* 38 - 126 U/L Final  . Total Bilirubin 07/20/2016 1.4* 0.3 - 1.2 mg/dL Final  . GFR calc non Af Amer 07/20/2016 >60  >60 mL/min Final  . GFR calc Af Amer 07/20/2016 >60  >60 mL/min Final   Comment: (NOTE) The eGFR has been calculated using the CKD EPI equation. This calculation has not been validated in all clinical situations. eGFR's persistently <60 mL/min signify possible Chronic Kidney Disease.   . Anion gap 07/20/2016 8  5 - 15 Final  . WBC 07/20/2016 6.2  3.6 - 11.0 K/uL Final  . RBC 07/20/2016 4.00  3.80 - 5.20 MIL/uL Final  . Hemoglobin 07/20/2016 13.4  12.0 - 16.0 g/dL Final  . HCT 07/20/2016 39.2  35.0 - 47.0 % Final  . MCV 07/20/2016 98.0  80.0 - 100.0 fL Final  . MCH 07/20/2016 33.4  26.0 - 34.0 pg Final  . MCHC 07/20/2016 34.1  32.0 - 36.0 g/dL Final  . RDW 07/20/2016 19.0* 11.5 - 14.5 % Final  . Platelets  07/20/2016 90* 150 - 440 K/uL Final  . Neutrophils Relative % 07/20/2016 90  % Final  . Neutro Abs 07/20/2016 5.5  1.4 - 6.5 K/uL Final  . Lymphocytes Relative 07/20/2016 4  % Final  . Lymphs Abs 07/20/2016 0.3* 1.0 - 3.6 K/uL Final  . Monocytes Relative 07/20/2016 5  % Final  . Monocytes Absolute 07/20/2016 0.3  0.2 - 0.9 K/uL Final  . Eosinophils Relative 07/20/2016 0  % Final  . Eosinophils Absolute 07/20/2016 0.0  0 - 0.7 K/uL Final  . Basophils Relative 07/20/2016 1  % Final  . Basophils Absolute 07/20/2016 0.0  0 - 0.1 K/uL Final  . Magnesium 07/20/2016 2.0  1.7 - 2.4 mg/dL Final  . Specimen Description 07/20/2016 URINE, CLEAN CATCH   Final  . Special Requests 07/20/2016 NONE   Final  . Culture 07/20/2016 MULTIPLE SPECIES PRESENT, SUGGEST RECOLLECTION*  Final  . Report Status 07/20/2016 07/21/2016 FINAL   Final  . Color, Urine 07/20/2016 YELLOW* YELLOW Final  . APPearance 07/20/2016 CLEAR* CLEAR Final  . Specific Gravity, Urine 07/20/2016 1.029  1.005 - 1.030 Final  . pH 07/20/2016 5.0  5.0 - 8.0 Final  . Glucose, UA 07/20/2016 NEGATIVE  NEGATIVE mg/dL Final  . Hgb urine dipstick 07/20/2016 MODERATE* NEGATIVE Final  . Bilirubin Urine 07/20/2016 NEGATIVE  NEGATIVE Final  . Ketones, ur 07/20/2016 NEGATIVE  NEGATIVE mg/dL Final  . Protein, ur 07/20/2016 NEGATIVE  NEGATIVE mg/dL Final  . Nitrite 07/20/2016 NEGATIVE  NEGATIVE Final  . Leukocytes, UA 07/20/2016 NEGATIVE  NEGATIVE Final  . RBC / HPF 07/20/2016 TOO NUMEROUS TO COUNT  0 - 5 RBC/hpf Final  . WBC, UA 07/20/2016 0-5  0 - 5 WBC/hpf Final  . Bacteria, UA 07/20/2016 NONE SEEN  NONE SEEN Final  . Squamous Epithelial / LPF 07/20/2016  NONE SEEN  NONE SEEN Final  . Mucous 07/20/2016 PRESENT   Final    Assessment:  AFRA TRICARICO is a 66 y.o. female with metastatic Her2/neu positive right breast cancer.  She initially presented in 03/2013 with an ulcerated breast mass and back pain.  Breast biopsy revealed lobular breast cancer.   Tumor was ER/PR positive and HER-2/neu positive. PET scan revealed bone metastasis. She received palliative radiation to T8 and T10 from 04/02-04/15/2015.  She declined chemotherapy. She initially began letrozole and Tykerb. Tykerb was discontinued after 1 dose. She received letrozole from 05/02/2013 until 01/07/2014 .  She received Herceptin from 06/02/2013 until 12/15/2013. She received fulvestrant monthly from 01/07/2014 until 05/27/2014.    She has received Xgeva monthly (03/31/2013 until 08/25/2014; last 05/04/2016).  She was switched to every 6 weeks Xgeva to coordinate with her treatment.  Herceptin was discontinued for a rising CA-27-29.  CA27.29 was 916.3 on 03/19/2013, 286.2 on 06/23/2013, 70.8 on 08/25/2013, 56.3 on 10/08/2013, 102.7 on 12/15/2013, 149.4 on 01/07/2014, 178.0 on 02/04/2014, 190.7 on 03/30/2014, 143.2 on 04/22/2014, 134.8 on 05/20/2014, 248.3 on 07/09/2014, 723.2 on 08/25/2014, 252.3 on 10/08/2014, 126 on 10/29/2014, 71.3 on 11/24/2014, 55 on 12/15/2014, 70.6 on 01/05/2015, 48.6 on 01/26/2015, 52.7 on 02/15/2015, 54.7 on 03/30/2015, 54 on 04/20/2015, 55.2 on 06/01/2015, 48.8 on 06/22/2015, 49.7 08/03/2015, 44.6 on 08/24/2015, 44.5 on 09/16/2015, 44.8 on 10/07/2015, 52.1 on 10/28/2015, 47.0 on 11/18/2015, 49.9 on 12/09/2015, 46.7 on 12/30/2015, 53.3 on 02/03/2016, 49.3 on 02/24/2016, 56.3 on 03/23/2016, 57.3 on 04/27/2016, 65.7 on 05/04/2016, 73.9 on 05/25/2016, and 101.1 on 06/23/2016.  She received Ibrance and Faslodex from 02/23/2014 until 07/27/2014.    Echo on 09/04/2014 revealed an EF of 55-65%.  Echo on 12/14/2014, 03/15/2015, 06/15/2015 revealed an EF of 55-60%, 50% on 09/23/2015, 60-65% on 11/16/2015, 60-65% on 02/16/2016, and 60-65% on 05/19/2016.   She is s/p 28 cycles of Kadcyla (09/10/2014 - 05/04/2016).  She is tolerating treatment well.  She denies any bone pain.   PET scan on 05/19/2016 revealed similar appearance of osseous metastasis with mild decrease in the  index lesion hypermetabolism.  There was no evidence of soft tissue metastasis.  There was the development of innumerable, partially cavitary upper lobe predominant pulmonary nodules.   Bronchoscopy on 05/31/2016 revealed no evidence of tumor or infection.  She was admitted to [facility] from 12/22/2014 - 12/24/2014 with E coli sepsis secondary to a UTI.  Serum protein was elevated.  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  She was admitted to [facility] from 06/28/2016 - 06/29/2016 with a seizure and encephalopathy.  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 There was extensive vasogenic edema surrounding the larger lesions of the bilateral frontal lobes and left parietal occipital lesions. There was no midline shift or hydrocephalus.  She was started on Keppra and Decadron.  She began whole brain radiation on 07/06/2016.  Plan was for whole brain radiation delivering 3000 cGy in 10 fractions.  Last treatment is tentatively 07/26/2016.  She was admitted to [facility] from 07/11/2016 - 07/14/2016 with headache and an acute inferior wall MI.  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  A drug eluting stent was placed in the mid RCA.  EF was 45-50%.  She was discharged on aspirin, Plavix, lisinopril, and Lipitor.  She has intermittent electrolytes issues (hypokalemia, hypomagnesemia, and hypocalcemia).  She is on supplementation.  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 Total bilirubin is 1.3 today.  Symptomatically, she is fatigued.  She is undergoing cranial radiation.  She has hematuria.  Exam reveals 3+ bilateral lower extremity pitting edema to the knees.  Plan: 1.  Labs today:  CBC with diff, CMP, Mg. 2.  Urinalysis and culture. 3.  Discuss interim hospitalizations, diagnosis of brain metastasis  undergoing XRT and MI s/p stent.  Discuss management of systemic disease, but progression in CNS.  Discuss discontinuation of Kadcyla.  Kadcyla reported to increase cerebral edema s/p brain radiation with pathology revealing necrosis.  Discuss plan to switch to Xeloda and lapatinib in future.  Discuss EF.  Anticipate repeat echo. 4.  Follow-up as scheduled with Dr. Clayborn Bigness, cardiologist, tomorrow. 5.  Anticipate reimaging of chest to assess cavitary pulmonary nodules. 6.  No chemotherapy today. 7.  Bilateral lower extremity duplex 8.  RTC in 1 week for MD assessment.  Addendum:  Bilateral lower extremity duplex today revealed no evidence of DVT.  LFTs are increased.  Check hepatitis serologies.  RUQ ultrasound.  If negative, check CT scan.   Lequita Asal, MD  07/20/2016, 11:13 AM

## 2016-07-21 ENCOUNTER — Ambulatory Visit
Admission: RE | Admit: 2016-07-21 | Discharge: 2016-07-21 | Disposition: A | Discharge status: Home / Self Care | Payer: Medicare Other | Source: Ambulatory Visit | Attending: Hematology and Oncology | Admitting: Hematology and Oncology

## 2016-07-21 ENCOUNTER — Ambulatory Visit
Admission: RE | Admit: 2016-07-21 | Discharge: 2016-07-21 | Disposition: A | Discharge status: Home / Self Care | Payer: Medicare Other | Source: Ambulatory Visit | Attending: Radiation Oncology | Admitting: Radiation Oncology

## 2016-07-21 ENCOUNTER — Inpatient Hospital Stay: Payer: Medicare Other

## 2016-07-21 ENCOUNTER — Ambulatory Visit: Payer: Medicare Other

## 2016-07-21 DIAGNOSIS — C50919 Malignant neoplasm of unspecified site of unspecified female breast: Secondary | ICD-10-CM | POA: Diagnosis not present

## 2016-07-21 DIAGNOSIS — C7951 Secondary malignant neoplasm of bone: Secondary | ICD-10-CM

## 2016-07-21 DIAGNOSIS — R42 Dizziness and giddiness: Secondary | ICD-10-CM | POA: Diagnosis not present

## 2016-07-21 DIAGNOSIS — I10 Essential (primary) hypertension: Secondary | ICD-10-CM | POA: Diagnosis not present

## 2016-07-21 DIAGNOSIS — Z955 Presence of coronary angioplasty implant and graft: Secondary | ICD-10-CM | POA: Diagnosis not present

## 2016-07-21 DIAGNOSIS — K760 Fatty (change of) liver, not elsewhere classified: Secondary | ICD-10-CM | POA: Diagnosis not present

## 2016-07-21 DIAGNOSIS — I959 Hypotension, unspecified: Secondary | ICD-10-CM | POA: Diagnosis not present

## 2016-07-21 DIAGNOSIS — K219 Gastro-esophageal reflux disease without esophagitis: Secondary | ICD-10-CM | POA: Diagnosis not present

## 2016-07-21 DIAGNOSIS — I2510 Atherosclerotic heart disease of native coronary artery without angina pectoris: Secondary | ICD-10-CM | POA: Diagnosis not present

## 2016-07-21 DIAGNOSIS — I2119 ST elevation (STEMI) myocardial infarction involving other coronary artery of inferior wall: Secondary | ICD-10-CM | POA: Diagnosis not present

## 2016-07-21 LAB — URINE CULTURE

## 2016-07-22 DIAGNOSIS — E440 Moderate protein-calorie malnutrition: Secondary | ICD-10-CM | POA: Diagnosis not present

## 2016-07-22 DIAGNOSIS — R569 Unspecified convulsions: Secondary | ICD-10-CM | POA: Diagnosis not present

## 2016-07-22 DIAGNOSIS — C7931 Secondary malignant neoplasm of brain: Secondary | ICD-10-CM | POA: Diagnosis not present

## 2016-07-22 DIAGNOSIS — I2119 ST elevation (STEMI) myocardial infarction involving other coronary artery of inferior wall: Secondary | ICD-10-CM | POA: Diagnosis not present

## 2016-07-22 DIAGNOSIS — J449 Chronic obstructive pulmonary disease, unspecified: Secondary | ICD-10-CM | POA: Diagnosis not present

## 2016-07-22 DIAGNOSIS — M1991 Primary osteoarthritis, unspecified site: Secondary | ICD-10-CM | POA: Diagnosis not present

## 2016-07-24 ENCOUNTER — Inpatient Hospital Stay: Payer: Medicare Other

## 2016-07-24 ENCOUNTER — Other Ambulatory Visit: Payer: Self-pay | Admitting: *Deleted

## 2016-07-24 ENCOUNTER — Emergency Department: Payer: Medicare Other

## 2016-07-24 ENCOUNTER — Inpatient Hospital Stay
Admission: EM | Admit: 2016-07-24 | Discharge: 2016-08-02 | DRG: 371 | Disposition: E | Payer: Medicare Other | Attending: Internal Medicine | Admitting: Internal Medicine

## 2016-07-24 ENCOUNTER — Other Ambulatory Visit: Payer: Self-pay

## 2016-07-24 ENCOUNTER — Encounter: Payer: Self-pay | Admitting: Emergency Medicine

## 2016-07-24 ENCOUNTER — Telehealth: Payer: Self-pay | Admitting: *Deleted

## 2016-07-24 ENCOUNTER — Ambulatory Visit: Payer: Medicare Other

## 2016-07-24 DIAGNOSIS — G936 Cerebral edema: Secondary | ICD-10-CM | POA: Diagnosis not present

## 2016-07-24 DIAGNOSIS — F329 Major depressive disorder, single episode, unspecified: Secondary | ICD-10-CM | POA: Diagnosis present

## 2016-07-24 DIAGNOSIS — B9623 Unspecified Shiga toxin-producing Escherichia coli [E. coli] (STEC) as the cause of diseases classified elsewhere: Secondary | ICD-10-CM | POA: Diagnosis present

## 2016-07-24 DIAGNOSIS — N170 Acute kidney failure with tubular necrosis: Secondary | ICD-10-CM | POA: Diagnosis present

## 2016-07-24 DIAGNOSIS — Z66 Do not resuscitate: Secondary | ICD-10-CM | POA: Diagnosis not present

## 2016-07-24 DIAGNOSIS — R945 Abnormal results of liver function studies: Secondary | ICD-10-CM | POA: Insufficient documentation

## 2016-07-24 DIAGNOSIS — E860 Dehydration: Secondary | ICD-10-CM | POA: Diagnosis present

## 2016-07-24 DIAGNOSIS — I2119 ST elevation (STEMI) myocardial infarction involving other coronary artery of inferior wall: Secondary | ICD-10-CM | POA: Diagnosis not present

## 2016-07-24 DIAGNOSIS — E785 Hyperlipidemia, unspecified: Secondary | ICD-10-CM | POA: Diagnosis present

## 2016-07-24 DIAGNOSIS — Z515 Encounter for palliative care: Secondary | ICD-10-CM | POA: Diagnosis not present

## 2016-07-24 DIAGNOSIS — R569 Unspecified convulsions: Secondary | ICD-10-CM | POA: Diagnosis not present

## 2016-07-24 DIAGNOSIS — A498 Other bacterial infections of unspecified site: Secondary | ICD-10-CM

## 2016-07-24 DIAGNOSIS — C7931 Secondary malignant neoplasm of brain: Secondary | ICD-10-CM | POA: Diagnosis present

## 2016-07-24 DIAGNOSIS — I25119 Atherosclerotic heart disease of native coronary artery with unspecified angina pectoris: Secondary | ICD-10-CM | POA: Diagnosis not present

## 2016-07-24 DIAGNOSIS — R627 Adult failure to thrive: Secondary | ICD-10-CM | POA: Diagnosis present

## 2016-07-24 DIAGNOSIS — I2510 Atherosclerotic heart disease of native coronary artery without angina pectoris: Secondary | ICD-10-CM | POA: Diagnosis present

## 2016-07-24 DIAGNOSIS — R809 Proteinuria, unspecified: Secondary | ICD-10-CM | POA: Diagnosis not present

## 2016-07-24 DIAGNOSIS — D890 Polyclonal hypergammaglobulinemia: Secondary | ICD-10-CM | POA: Diagnosis not present

## 2016-07-24 DIAGNOSIS — K219 Gastro-esophageal reflux disease without esophagitis: Secondary | ICD-10-CM | POA: Diagnosis present

## 2016-07-24 DIAGNOSIS — R531 Weakness: Secondary | ICD-10-CM

## 2016-07-24 DIAGNOSIS — R32 Unspecified urinary incontinence: Secondary | ICD-10-CM | POA: Diagnosis present

## 2016-07-24 DIAGNOSIS — I10 Essential (primary) hypertension: Secondary | ICD-10-CM | POA: Diagnosis present

## 2016-07-24 DIAGNOSIS — R740 Nonspecific elevation of levels of transaminase and lactic acid dehydrogenase [LDH]: Secondary | ICD-10-CM | POA: Diagnosis not present

## 2016-07-24 DIAGNOSIS — C50919 Malignant neoplasm of unspecified site of unspecified female breast: Secondary | ICD-10-CM

## 2016-07-24 DIAGNOSIS — Z170 Estrogen receptor positive status [ER+]: Secondary | ICD-10-CM

## 2016-07-24 DIAGNOSIS — R748 Abnormal levels of other serum enzymes: Secondary | ICD-10-CM | POA: Diagnosis not present

## 2016-07-24 DIAGNOSIS — E875 Hyperkalemia: Secondary | ICD-10-CM | POA: Diagnosis present

## 2016-07-24 DIAGNOSIS — Z7902 Long term (current) use of antithrombotics/antiplatelets: Secondary | ICD-10-CM

## 2016-07-24 DIAGNOSIS — N179 Acute kidney failure, unspecified: Secondary | ICD-10-CM | POA: Diagnosis not present

## 2016-07-24 DIAGNOSIS — J449 Chronic obstructive pulmonary disease, unspecified: Secondary | ICD-10-CM | POA: Diagnosis present

## 2016-07-24 DIAGNOSIS — C50911 Malignant neoplasm of unspecified site of right female breast: Secondary | ICD-10-CM | POA: Diagnosis not present

## 2016-07-24 DIAGNOSIS — R911 Solitary pulmonary nodule: Secondary | ICD-10-CM | POA: Diagnosis present

## 2016-07-24 DIAGNOSIS — I213 ST elevation (STEMI) myocardial infarction of unspecified site: Secondary | ICD-10-CM | POA: Diagnosis present

## 2016-07-24 DIAGNOSIS — I4901 Ventricular fibrillation: Secondary | ICD-10-CM | POA: Diagnosis not present

## 2016-07-24 DIAGNOSIS — I959 Hypotension, unspecified: Secondary | ICD-10-CM | POA: Diagnosis not present

## 2016-07-24 DIAGNOSIS — Z885 Allergy status to narcotic agent status: Secondary | ICD-10-CM

## 2016-07-24 DIAGNOSIS — Z803 Family history of malignant neoplasm of breast: Secondary | ICD-10-CM

## 2016-07-24 DIAGNOSIS — Z7982 Long term (current) use of aspirin: Secondary | ICD-10-CM

## 2016-07-24 DIAGNOSIS — D6959 Other secondary thrombocytopenia: Secondary | ICD-10-CM | POA: Diagnosis present

## 2016-07-24 DIAGNOSIS — Z96652 Presence of left artificial knee joint: Secondary | ICD-10-CM | POA: Diagnosis present

## 2016-07-24 DIAGNOSIS — L89301 Pressure ulcer of unspecified buttock, stage 1: Secondary | ICD-10-CM | POA: Diagnosis present

## 2016-07-24 DIAGNOSIS — M1991 Primary osteoarthritis, unspecified site: Secondary | ICD-10-CM | POA: Diagnosis not present

## 2016-07-24 DIAGNOSIS — C7951 Secondary malignant neoplasm of bone: Secondary | ICD-10-CM | POA: Diagnosis present

## 2016-07-24 DIAGNOSIS — E871 Hypo-osmolality and hyponatremia: Secondary | ICD-10-CM | POA: Diagnosis present

## 2016-07-24 DIAGNOSIS — J90 Pleural effusion, not elsewhere classified: Secondary | ICD-10-CM | POA: Diagnosis present

## 2016-07-24 DIAGNOSIS — K589 Irritable bowel syndrome without diarrhea: Secondary | ICD-10-CM | POA: Diagnosis present

## 2016-07-24 DIAGNOSIS — Z923 Personal history of irradiation: Secondary | ICD-10-CM

## 2016-07-24 DIAGNOSIS — R7401 Elevation of levels of liver transaminase levels: Secondary | ICD-10-CM

## 2016-07-24 DIAGNOSIS — E876 Hypokalemia: Secondary | ICD-10-CM | POA: Diagnosis not present

## 2016-07-24 DIAGNOSIS — Z79899 Other long term (current) drug therapy: Secondary | ICD-10-CM

## 2016-07-24 DIAGNOSIS — F17210 Nicotine dependence, cigarettes, uncomplicated: Secondary | ICD-10-CM | POA: Diagnosis present

## 2016-07-24 DIAGNOSIS — K760 Fatty (change of) liver, not elsewhere classified: Secondary | ICD-10-CM | POA: Diagnosis present

## 2016-07-24 DIAGNOSIS — A0472 Enterocolitis due to Clostridium difficile, not specified as recurrent: Principal | ICD-10-CM | POA: Diagnosis present

## 2016-07-24 DIAGNOSIS — Z961 Presence of intraocular lens: Secondary | ICD-10-CM | POA: Diagnosis present

## 2016-07-24 DIAGNOSIS — R410 Disorientation, unspecified: Secondary | ICD-10-CM | POA: Diagnosis not present

## 2016-07-24 DIAGNOSIS — R7989 Other specified abnormal findings of blood chemistry: Secondary | ICD-10-CM

## 2016-07-24 DIAGNOSIS — E440 Moderate protein-calorie malnutrition: Secondary | ICD-10-CM | POA: Diagnosis not present

## 2016-07-24 DIAGNOSIS — L8990 Pressure ulcer of unspecified site, unspecified stage: Secondary | ICD-10-CM | POA: Insufficient documentation

## 2016-07-24 DIAGNOSIS — R319 Hematuria, unspecified: Secondary | ICD-10-CM | POA: Diagnosis present

## 2016-07-24 DIAGNOSIS — Z7189 Other specified counseling: Secondary | ICD-10-CM | POA: Diagnosis not present

## 2016-07-24 DIAGNOSIS — D696 Thrombocytopenia, unspecified: Secondary | ICD-10-CM

## 2016-07-24 DIAGNOSIS — Z6823 Body mass index (BMI) 23.0-23.9, adult: Secondary | ICD-10-CM

## 2016-07-24 DIAGNOSIS — Z955 Presence of coronary angioplasty implant and graft: Secondary | ICD-10-CM

## 2016-07-24 DIAGNOSIS — Z90710 Acquired absence of both cervix and uterus: Secondary | ICD-10-CM

## 2016-07-24 DIAGNOSIS — Z9221 Personal history of antineoplastic chemotherapy: Secondary | ICD-10-CM

## 2016-07-24 LAB — COMPREHENSIVE METABOLIC PANEL
ALT: 335 U/L — ABNORMAL HIGH (ref 14–54)
ANION GAP: 7 (ref 5–15)
AST: 291 U/L — ABNORMAL HIGH (ref 15–41)
Albumin: 2.1 g/dL — ABNORMAL LOW (ref 3.5–5.0)
Alkaline Phosphatase: 136 U/L — ABNORMAL HIGH (ref 38–126)
BUN: 33 mg/dL — ABNORMAL HIGH (ref 6–20)
CALCIUM: 8.1 mg/dL — AB (ref 8.9–10.3)
CHLORIDE: 101 mmol/L (ref 101–111)
CO2: 26 mmol/L (ref 22–32)
Creatinine, Ser: 0.75 mg/dL (ref 0.44–1.00)
Glucose, Bld: 87 mg/dL (ref 65–99)
Potassium: 3.8 mmol/L (ref 3.5–5.1)
Sodium: 134 mmol/L — ABNORMAL LOW (ref 135–145)
Total Bilirubin: 1.7 mg/dL — ABNORMAL HIGH (ref 0.3–1.2)
Total Protein: 4.9 g/dL — ABNORMAL LOW (ref 6.5–8.1)

## 2016-07-24 LAB — CBC WITH DIFFERENTIAL/PLATELET
Basophils Absolute: 0.2 10*3/uL — ABNORMAL HIGH (ref 0–0.1)
Basophils Relative: 3 %
EOS ABS: 0 10*3/uL (ref 0–0.7)
EOS PCT: 0 %
HCT: 40.3 % (ref 35.0–47.0)
Hemoglobin: 13.7 g/dL (ref 12.0–16.0)
LYMPHS ABS: 1 10*3/uL (ref 1.0–3.6)
Lymphocytes Relative: 18 %
MCH: 33 pg (ref 26.0–34.0)
MCHC: 34.1 g/dL (ref 32.0–36.0)
MCV: 96.7 fL (ref 80.0–100.0)
MONO ABS: 0.1 10*3/uL — AB (ref 0.2–0.9)
MONOS PCT: 2 %
Neutro Abs: 4.5 10*3/uL (ref 1.4–6.5)
Neutrophils Relative %: 77 %
PLATELETS: 56 10*3/uL — AB (ref 150–440)
RBC: 4.17 MIL/uL (ref 3.80–5.20)
RDW: 19.2 % — AB (ref 11.5–14.5)
WBC: 5.8 10*3/uL (ref 3.6–11.0)

## 2016-07-24 LAB — URINALYSIS, COMPLETE (UACMP) WITH MICROSCOPIC
BACTERIA UA: NONE SEEN
BILIRUBIN URINE: NEGATIVE
GLUCOSE, UA: NEGATIVE mg/dL
Ketones, ur: NEGATIVE mg/dL
LEUKOCYTES UA: NEGATIVE
NITRITE: NEGATIVE
Protein, ur: 30 mg/dL — AB
SPECIFIC GRAVITY, URINE: 1.023 (ref 1.005–1.030)
pH: 5 (ref 5.0–8.0)

## 2016-07-24 LAB — TROPONIN I: TROPONIN I: 2.99 ng/mL — AB (ref <0.03)

## 2016-07-24 LAB — BRAIN NATRIURETIC PEPTIDE: B Natriuretic Peptide: 475 pg/mL — ABNORMAL HIGH (ref 0.0–100.0)

## 2016-07-24 MED ORDER — ONDANSETRON HCL 4 MG PO TABS: 4.0000 mg | ORAL_TABLET | Freq: Four times a day (QID) | ORAL | Status: DC | PRN

## 2016-07-24 MED ORDER — CLONAZEPAM 0.5 MG PO TABS
0.2500 mg | ORAL_TABLET | Freq: Two times a day (BID) | ORAL | Status: DC | PRN
  Administered 2016-07-24: 0.25 mg via ORAL
  Filled 2016-07-24: qty 1

## 2016-07-24 MED ORDER — BISACODYL 5 MG PO TBEC: 5.0000 mg | DELAYED_RELEASE_TABLET | Freq: Every day | ORAL | Status: DC | PRN

## 2016-07-24 MED ORDER — CALCIUM-VITAMIN D 500-200 MG-UNIT PO TABS
1.0000 | ORAL_TABLET | Freq: Two times a day (BID) | ORAL | Status: DC
  Administered 2016-07-24 – 2016-07-28 (×8): 1 via ORAL
  Filled 2016-07-24 (×9): qty 1

## 2016-07-24 MED ORDER — MAGNESIUM 500 MG PO TABS: 500.0000 mg | ORAL_TABLET | Freq: Every day | ORAL | Status: DC

## 2016-07-24 MED ORDER — ACETAMINOPHEN 325 MG PO TABS: 650.0000 mg | ORAL_TABLET | Freq: Four times a day (QID) | ORAL | Status: DC | PRN

## 2016-07-24 MED ORDER — DEXAMETHASONE 4 MG PO TABS
4.0000 mg | ORAL_TABLET | Freq: Two times a day (BID) | ORAL | Status: DC
  Administered 2016-07-25 – 2016-07-28 (×7): 4 mg via ORAL
  Filled 2016-07-24 (×8): qty 1

## 2016-07-24 MED ORDER — ASPIRIN 81 MG PO CHEW
81.0000 mg | CHEWABLE_TABLET | Freq: Every day | ORAL | Status: DC
  Administered 2016-07-25 – 2016-07-28 (×4): 81 mg via ORAL
  Filled 2016-07-24 (×4): qty 1

## 2016-07-24 MED ORDER — SODIUM CHLORIDE 0.9 % IV SOLN
INTRAVENOUS | Status: DC
  Administered 2016-07-24 – 2016-07-28 (×5): via INTRAVENOUS

## 2016-07-24 MED ORDER — ACETAMINOPHEN 325 MG PO TABS: 650.0000 mg | ORAL_TABLET | Freq: Every day | ORAL | Status: DC | PRN

## 2016-07-24 MED ORDER — ONDANSETRON HCL 4 MG/2ML IJ SOLN: 4.0000 mg | Freq: Four times a day (QID) | INTRAMUSCULAR | Status: DC | PRN

## 2016-07-24 MED ORDER — SENNOSIDES-DOCUSATE SODIUM 8.6-50 MG PO TABS: 1.0000 | ORAL_TABLET | Freq: Every evening | ORAL | Status: DC | PRN

## 2016-07-24 MED ORDER — ACETAMINOPHEN 650 MG RE SUPP: 650.0000 mg | Freq: Four times a day (QID) | RECTAL | Status: DC | PRN

## 2016-07-24 MED ORDER — LORATADINE 10 MG PO TABS: 10.0000 mg | ORAL_TABLET | Freq: Every day | ORAL | Status: DC | PRN

## 2016-07-24 MED ORDER — MAGNESIUM OXIDE 400 (241.3 MG) MG PO TABS
400.0000 mg | ORAL_TABLET | Freq: Every day | ORAL | Status: DC
  Administered 2016-07-25 – 2016-07-28 (×4): 400 mg via ORAL
  Filled 2016-07-24 (×4): qty 1

## 2016-07-24 MED ORDER — CLOPIDOGREL BISULFATE 75 MG PO TABS
75.0000 mg | ORAL_TABLET | Freq: Every day | ORAL | Status: DC
  Administered 2016-07-25 – 2016-07-28 (×4): 75 mg via ORAL
  Filled 2016-07-24 (×4): qty 1

## 2016-07-24 MED ORDER — ALBUMIN HUMAN 25 % IV SOLN
12.5000 g | Freq: Once | INTRAVENOUS | Status: AC
  Administered 2016-07-24: 12.5 g via INTRAVENOUS
  Filled 2016-07-24: qty 50

## 2016-07-24 MED ORDER — PANTOPRAZOLE SODIUM 40 MG PO TBEC
40.0000 mg | DELAYED_RELEASE_TABLET | Freq: Every day | ORAL | Status: DC
  Administered 2016-07-25 – 2016-07-28 (×4): 40 mg via ORAL
  Filled 2016-07-24 (×4): qty 1

## 2016-07-24 MED ORDER — ONDANSETRON HCL 4 MG/2ML IJ SOLN
4.0000 mg | Freq: Once | INTRAMUSCULAR | Status: AC
  Administered 2016-07-24: 4 mg via INTRAVENOUS
  Filled 2016-07-24: qty 2

## 2016-07-24 MED ORDER — LEVETIRACETAM 500 MG PO TABS
500.0000 mg | ORAL_TABLET | Freq: Two times a day (BID) | ORAL | Status: DC
  Administered 2016-07-24 – 2016-07-28 (×8): 500 mg via ORAL
  Filled 2016-07-24 (×8): qty 1

## 2016-07-24 MED ORDER — SODIUM CHLORIDE 0.9 % IV BOLUS (SEPSIS)
500.0000 mL | Freq: Once | INTRAVENOUS | Status: AC
  Administered 2016-07-24: 500 mL via INTRAVENOUS

## 2016-07-24 MED ORDER — SODIUM CHLORIDE 0.9 % IV BOLUS (SEPSIS)
1000.0000 mL | Freq: Once | INTRAVENOUS | Status: AC
  Administered 2016-07-24: 1000 mL via INTRAVENOUS

## 2016-07-24 MED ORDER — HYDROCODONE-ACETAMINOPHEN 5-325 MG PO TABS
1.0000 | ORAL_TABLET | ORAL | Status: DC | PRN
  Administered 2016-07-25: 1 via ORAL
  Administered 2016-07-27 – 2016-07-28 (×2): 2 via ORAL
  Filled 2016-07-24 (×2): qty 2
  Filled 2016-07-24: qty 1

## 2016-07-24 MED ORDER — ATORVASTATIN CALCIUM 20 MG PO TABS
80.0000 mg | ORAL_TABLET | Freq: Every day | ORAL | Status: DC
  Administered 2016-07-25 – 2016-07-27 (×3): 80 mg via ORAL
  Filled 2016-07-24 (×3): qty 4

## 2016-07-24 NOTE — Telephone Encounter (Signed)
Crystal called to report that Elizabeth Bond has "not been right for 3 -4 days" States she is weak in bed, confused, sleeping all the time, incontinent,  and that her words are jumbled.  Please advised

## 2016-07-24 NOTE — H&P (Signed)
[facility] NAME: Elizabeth Bond    MR#:  [id number]  DATE OF BIRTH:  1950-02-09  DATE OF ADMISSION:  07/26/2016  PRIMARY CARE PHYSICIAN: Patient, No Pcp Per   REQUESTING/REFERRING PHYSICIAN: dr Alfred Levins  CHIEF COMPLAINT:   Weakness low BP HISTORY OF PRESENT ILLNESS:  Elizabeth Bond  is a 66 y.o. 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 Since patient's discharge from the hospital she has had progressive decline in her status. Her family brought her in today due to progressive weakness and low blood pressure. At times she feels confused. She has 3 more radiation treatments to her brain left. She denies urinary symptoms, cough, fever, chills. She has generalized weakness. She denies chest pain, shortness breath.  CT of the brain shows some improvement in cerebral edema. She is currently on Decadron. She has had no seizures since discharge from the hospital.  Her blood pressure in the emergency room with systolic in 84O. She has received 1-1/2 L of fluids. Chest x-ray does not show evidence of pneumonia. Urine analysis is pending.  PAST MEDICAL HISTORY:   Past Medical History:  Diagnosis Date  . Arthritis   . Brain metastasis ([place]) 07/06/2016  . Breast cancer ([place])    right  . Cancer ([place]) 2015   breast and bone  . COPD (chronic obstructive pulmonary disease) ([place])   . Depression   . GERD (gastroesophageal reflux disease)   . IBS (irritable bowel syndrome)   . Metastasis to bone ([place]) 09/01/2014    PAST SURGICAL HISTORY:   Past Surgical History:  Procedure Laterality Date  . ABDOMINAL HYSTERECTOMY  1978  . CATARACT EXTRACTION W/PHACO Right 06/06/2016   Procedure: CATARACT EXTRACTION PHACO AND INTRAOCULAR  LENS PLACEMENT (IOC);  Surgeon: Birder Robson, MD;  Location: [facility];  Service: Ophthalmology;  Laterality: Right;  [place] 00:51.1 AP% 14.0 CDE 7.16 Fluid pack lot # [id number]  . CATARACT EXTRACTION W/PHACO Left 06/27/2016   Procedure: CATARACT EXTRACTION PHACO AND INTRAOCULAR LENS PLACEMENT (IOC);  Surgeon: Birder Robson, MD;  Location: [facility];  Service: Ophthalmology;  Laterality: Left;  [place] 00:37 AP% 19.1 CDE  7.11 Fluid pack lot # [id number]  . CORONARY/GRAFT ACUTE MI REVASCULARIZATION N/A 07/11/2016   Procedure: Coronary/Graft Acute MI Revascularization;  Surgeon: Yolonda Kida, MD;  Location: [place] [facility];  Service: Cardiovascular;  Laterality: N/A;  . FLEXIBLE BRONCHOSCOPY N/A 05/31/2016   Procedure: FLEXIBLE BRONCHOSCOPY;  Surgeon: Wilhelmina Mcardle, MD;  Location: [facility];  Service: Pulmonary;  Laterality: N/A;  . JOINT REPLACEMENT    . LEFT HEART CATH AND CORONARY ANGIOGRAPHY N/A 07/11/2016   Procedure: Left Heart Cath and Coronary Angiography;  Surgeon: Yolonda Kida, MD;  Location: [place] [facility];  Service: Cardiovascular;  Laterality: N/A;  . PORTACATH PLACEMENT  2015  . REPLACEMENT TOTAL KNEE Left 2004-2005?    SOCIAL HISTORY:   Social History  Substance Use Topics  . Smoking status: Current Every Day Smoker    Packs/day: 0.25    Years: 40.00    Types: Cigarettes  . Smokeless tobacco: Never Used  . Alcohol use No    FAMILY HISTORY:   Family History  Problem Relation Age of Onset  . Cancer Father  prostate  . Cancer Mother        Pancreatic  . Cancer Maternal Aunt        breast  . Cancer Cousin        breast  . Cancer Other        lung cancer - neice  . Cancer - Other Daughter        brain    DRUG ALLERGIES:   Allergies  Allergen Reactions  . Fentanyl Palpitations and Other (See Comments)    "loopy and confused" "feels like heart is going to come out of my chest"    REVIEW OF SYSTEMS:   Review of Systems   Constitutional: Positive for malaise/fatigue and weight loss. Negative for chills and fever.       Decreased appetite  HENT: Negative.  Negative for ear discharge, ear pain, hearing loss, nosebleeds and sore throat.   Eyes: Negative.  Negative for blurred vision and pain.  Respiratory: Negative.  Negative for cough, hemoptysis, shortness of breath and wheezing.   Cardiovascular: Positive for leg swelling. Negative for chest pain and palpitations.  Gastrointestinal: Negative.  Negative for abdominal pain, blood in stool, diarrhea, nausea and vomiting.  Genitourinary: Negative.  Negative for dysuria.       Decreased urine output  Musculoskeletal: Negative.  Negative for back pain.  Skin: Negative.   Neurological: Positive for dizziness (When getting up) and weakness. Negative for tremors, speech change, focal weakness, seizures and headaches.  Endo/Heme/Allergies: Negative.  Does not bruise/bleed easily.  Psychiatric/Behavioral: Negative.  Negative for depression, hallucinations and suicidal ideas.    MEDICATIONS AT HOME:   Prior to Admission medications   Medication Sig Start Date End Date Taking? Authorizing Provider  acetaminophen (TYLENOL) 325 MG tablet Take 650 mg by mouth daily as needed for headache.   Yes [provider]  aspirin 81 MG chewable tablet Chew 1 tablet (81 mg total) by mouth daily. 07/14/16  Yes Fritzi Mandes, MD  atorvastatin (LIPITOR) 80 MG tablet Take 1 tablet (80 mg total) by mouth daily at 6 PM. 07/14/16  Yes Fritzi Mandes, MD  Calcium Carb-Cholecalciferol (CALCIUM-VITAMIN D3) 600-400 MG-UNIT TABS Take 1 tablet by mouth 2 (two) times daily.   Yes [provider]  clonazePAM (KLONOPIN) 0.5 MG tablet TAKE 1 TABLET 3 TIMES DAILY AS NEEDED FOR ANXIETY 07/10/16  Yes Lequita Asal, MD  clopidogrel (PLAVIX) 75 MG tablet Take 1 tablet (75 mg total) by mouth daily with breakfast. 07/15/16  Yes Fritzi Mandes, MD  dexamethasone (DECADRON) 4 MG tablet Take 1  tablet (4 mg total) by mouth 2 (two) times daily with a meal. 06/29/16  Yes Kemar Pandit, MD  levETIRAcetam (KEPPRA) 500 MG tablet Take 1 tablet (500 mg total) by mouth 2 (two) times daily. 06/29/16  Yes Klohe Lovering, Ulice Bold, MD  lisinopril (PRINIVIL,ZESTRIL) 5 MG tablet Take 1 tablet (5 mg total) by mouth daily. 07/15/16  Yes Fritzi Mandes, MD  Magnesium 500 MG TABS Take 500 mg by mouth daily.    Yes [provider]  metoprolol tartrate (LOPRESSOR) 25 MG tablet Take 0.5 tablets (12.5 mg total) by mouth 2 (two) times daily. 07/14/16  Yes Fritzi Mandes, MD  pantoprazole (PROTONIX) 40 MG tablet Take 1 tablet (40 mg total) by mouth daily. 03/23/16  Yes Lequita Asal, MD  phosphorus (K PHOS NEUTRAL) 155-852-130 MG tablet Take 1 tablet (250 mg total) by mouth 2 (two) times daily. 06/23/16  Yes Lequita Asal, MD  potassium chloride (K-DUR) 10  MEQ tablet TAKE ONE TABLET BY MOUTH EVERY DAY 06/23/16  Yes Corcoran, Melissa C, MD  temazepam (RESTORIL) 15 MG capsule TAKE 1 CAPSULE AT BEDTIME AS NEEDED FOR SLEEP 07/20/16  Yes Corcoran, Melissa C, MD  loratadine (CLARITIN) 10 MG tablet Take 10 mg by mouth daily as needed for allergies.     [provider]      VITAL SIGNS:  Blood pressure (!) 105/53, pulse 60, temperature 98.8 F (37.1 C), temperature source Oral, resp. rate 15, height _0  (1.626 m), weight 58.5 kg (129 lb), SpO2 96 %.  PHYSICAL EXAMINATION:   Physical Exam  Constitutional: She is oriented to person, place, and time and well-developed, well-nourished, and in no distress. No distress.  HENT:  Head: Normocephalic.  Dry mucous membranes  Eyes: No scleral icterus.  Neck: Normal range of motion. Neck supple. No JVD present. No tracheal deviation present.  Cardiovascular: Normal rate, regular rhythm and normal heart sounds.  Exam reveals no gallop and no friction rub.   No murmur heard. Pulmonary/Chest: Effort normal and breath sounds normal. No respiratory distress. She has no  wheezes. She has no rales. She exhibits no tenderness.  Abdominal: Soft. Bowel sounds are normal. She exhibits no distension and no mass. There is no tenderness. There is no rebound and no guarding.  Musculoskeletal: Normal range of motion. She exhibits edema (Third spacing).  Neurological: She is alert and oriented to person, place, and time.  Skin: Skin is warm. No rash noted. No erythema.  Psychiatric: Affect and judgment normal.      LABORATORY PANEL:   CBC  Recent Labs Lab 07/20/2016 1640  WBC 5.8  HGB 13.7  HCT 40.3  PLT 56*   ------------------------------------------------------------------------------------------------------------------  Chemistries   Recent Labs Lab 07/20/16 1152 07/05/2016 1640  NA 138 134*  K 3.7 3.8  CL 106 101  CO2 24 26  GLUCOSE 94 87  BUN 34* 33*  CREATININE 0.77 0.75  CALCIUM 8.0* 8.1*  MG 2.0  --   AST 115* 291*  ALT 148* 335*  ALKPHOS 157* 136*  BILITOT 1.4* 1.7*   ------------------------------------------------------------------------------------------------------------------  Cardiac Enzymes  Recent Labs Lab 07/23/2016 1640  TROPONINI 2.99*   ------------------------------------------------------------------------------------------------------------------  RADIOLOGY:  Dg Chest 2 View  Result Date: 07/31/2016 CLINICAL DATA:  Breast cancer weakness and fatigue EXAM: CHEST  2 VIEW COMPARISON:  07/11/2016, 12/20/ 2016 FINDINGS: Left-sided central venous port with tip overlying the cavoatrial junction. Small pleural effusions. No focal consolidation. Mild cardiomegaly with aortic atherosclerosis. Stable 8-9 mm right upper lobe pulmonary nodule. Stable moderate severe compression of mid to lower thoracic vertebra. IMPRESSION: 1. Small bilateral pleural effusions 2. Stable mild cardiomegaly 3. Stable right upper lobe pulmonary nodule Electronically Signed   By: Donavan Foil M.D.   On: 07/21/2016 18:37   Ct Head Wo  Contrast  Result Date: 07/06/2016 CLINICAL DATA:  66 year old female with metastatic breast cancer post treatment with progressive weakness. Subsequent encounter. EXAM: CT HEAD WITHOUT CONTRAST TECHNIQUE: Contiguous axial images were obtained from the base of the skull through the vertex without intravenous contrast. COMPARISON:  07/11/2016 06/28/2016 head CT.  06/28/2016 brain MR. FINDINGS: Brain: Further decrease in size of intracranial metastatic lesions and surrounding vasogenic edema. Vasogenic edema remains in portions of the right temporal lobe, left frontal lobe and left parietal lobe. Right caudate head lesion decrease in size with decrease slight flattening of the right frontal horn. No new intracranial abnormality, hemorrhage or CT evidence of large acute infarct noted. Vascular:  Vascular calcifications. Skull: No new focal calvarial abnormality. Sinuses/Orbits: No acute orbital abnormality. Minimal mucosal thickening left maxillary sinus and right sphenoid sinus. Other: Opacification right mastoid air cells with breakthrough right lateral wall similar prior exam. Partial opacification right middle ear. [place] incompletely assessed and possibly partially eroded. Retraction right tympanic membrane. IMPRESSION: Further decrease in size of intracranial metastatic lesions and surrounding vasogenic edema. Vasogenic edema remains in portions of the right temporal lobe, left frontal lobe and left parietal lobe. Right caudate head lesion decrease in size with decrease slight flattening of the right frontal horn. No new intracranial abnormality, hemorrhage or CT evidence of large acute infarct noted. Opacification right mastoid air cells with breakthrough right lateral wall similar prior exam. Partial opacification right middle ear. [place] incompletely assessed and possibly partially eroded. Electronically Signed   By: Genia Del M.D.   On: 07/13/2016 18:35    EKG:  Normal sinus rhythm with LVH no ST  depression improved ST elevation from previous EKG  IMPRESSION AND PLAN:   66 year old female with metastatic breast cancer and brain metastases, recent inferior ST elevation MI who presents with hypotension and generalized weakness.   1. Hypotension with generalized weakness/Adult FTT: This is multifactorial in nature from medications, poor po intake,radiation treatment, cancer. I will stop hypertensive medications for now ( despite recent MI discussed r/b with patient and family) Check UA PT consult requested Palliative care consult  2. Elevated troponin with recent Inf STEMI: Seems that it is trending downward. Stop metoprolol and ACE inhibitor for now due to hypotension Continue aspirin and statin Will request cardiology consultation  3. Metastatic breast cancer with surgical edema: Continue Decadron Will request oncology evaluation  4. History of seizures due to metastatic brain cancer: Continue Keppra  5. Thrombocytopenia: For now will continue aspirin and Plavix. No DVT prophylaxis due to thrombocytopenia Repeat CBC in a.m. May need to stop these medications as well which would be damaging to her recent inferior ST elevation MI.  6. Third spacing with peripheral edema: Patient will need gentle IV fluids due to poor by mouth intake and generalized weakness Will also provide albumin 1 time and reevaluate in a.m.  All the records are reviewed and case discussed with ED provider. Management plans discussed with the patient and family and she is in agreement  CODE STATUS: FULL  TOTAL TIME TAKING CARE OF THIS PATIENT: 45 minutes.    Dock Baccam M.D on 07/12/2016 at 7:06 PM  Between 7am to 6pm - Pager - [phone]  After 6pm go to [email or web address] - password [email or web address] [place] [facility]  Office  [phone]  CC: Primary care physician; Patient, No Pcp Per

## 2016-07-24 NOTE — ED Triage Notes (Signed)
Pt presents to ED with known breast cancer complaining of weakness, fatigue and low BP the last 2-3 days. Family states home health nurse had BP <503 systolic. Pt has now become incontinent.

## 2016-07-24 NOTE — ED Notes (Signed)
Pt sent from cancer center for progressive weakness, states episodes of incontinence and recently had a UTI, pt has hx of breast cancer with mets with the brain

## 2016-07-24 NOTE — Telephone Encounter (Signed)
Danny Lawless NP called report to [facility] triage RN in the ED that the patient was coming to the ED and the symptoms

## 2016-07-24 NOTE — Progress Notes (Signed)
Family Meeting Note  Advance Directive:yes  Today a meeting took place with the Patient.and family   The following clinical team members were present during this meeting:MD  The following were discussed:Patient's diagnosis: Generalized weakness with adult failure to thrive Hypotension Recent inferior MI , Patient's progosis: < 12 months and Goals for treatment: Full Code  Additional follow-up to be provided: Palliative care for CODE STATUS and goals of care  Time spent during discussion:20 minutes  Madalina Rosman, MD

## 2016-07-24 NOTE — ED Provider Notes (Signed)
[facility] Emergency Department Provider Note  ____________________________________________  Time seen: Approximately 5:47 PM  I have reviewed the triage vital signs and the nursing notes.   HISTORY  Chief Complaint Weakness   HPI Elizabeth Bond is a 66 y.o. 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 Patient is accompanied by her daughter who lives with her.According to the family patient has had generalized weakness and fatigue for the last 2-3 days. According to home health nurse her blood pressure has been low and today was less than 270 systolic. Patient has not been eating or drinking much. No fever or chills. She has had nausea. She has been having several daily BMs but no diarrhea, no cough, no congestion, no dysuria or hematuria. Patient has been incontinent of urine for the last 10 days. No back pain. No saddle anesthesia. No abdominal pain, no CP. No HA, no changes in vision, no slurred speech, no unilateral weakness or numbness.  Of note, patient was hospitalized 2 weeks ago after having a STEMI with a drug-eluting stent to the RCA and is now on Plavix. She has noted some specks of blood in her stool but no melena.  Past Medical History:  Diagnosis Date  . Arthritis   . Brain metastasis ([place]) 07/06/2016  . Breast cancer ([place])    right  . Cancer ([place]) 2015   breast and bone  . COPD (chronic obstructive pulmonary disease) ([place])   . Depression   . GERD (gastroesophageal reflux disease)   . IBS (irritable bowel syndrome)   . Metastasis to bone ([place]) 09/01/2014    Patient Active Problem List   Diagnosis Date Noted  . Abnormal liver function tests 07/18/2016  . Weakness 07/12/2016  . Chronic intractable headache   . Palliative care by specialist   . Malnutrition of moderate degree 07/13/2016    . Acute ST elevation myocardial infarction (STEMI) involving right coronary artery without development of Q waves ([place]) 07/11/2016  . STEMI (ST elevation myocardial infarction) ([place]) 07/11/2016  . Brain metastasis ([place]) 07/06/2016  . Seizures ([place]) 06/28/2016  . Pulmonary nodules 05/25/2016  . Hypomagnesemia 02/26/2016  . Hypocalcemia 02/26/2016  . Elevated bilirubin 02/26/2016  . Right shoulder pain 02/24/2016  . Hypokalemia 10/07/2015  . Goals of care, counseling/discussion 09/16/2015  . Malignant neoplasm of right breast in female, estrogen receptor positive ([place]) 05/13/2015  . Sepsis ([place]) 12/23/2014  . Metastasis to bone ([place]) 09/01/2014  . Metastatic breast cancer ([place]) 06/16/2013    Past Surgical History:  Procedure Laterality Date  . ABDOMINAL HYSTERECTOMY  1978  . CATARACT EXTRACTION W/PHACO Right 06/06/2016   Procedure: CATARACT EXTRACTION PHACO AND INTRAOCULAR LENS PLACEMENT (IOC);  Surgeon: Birder Robson, MD;  Location: [facility] ORS;  Service: Ophthalmology;  Laterality: Right;  [place] 00:51.1 AP% 14.0 CDE 7.16 Fluid pack lot # [id number]  . CATARACT EXTRACTION W/PHACO Left 06/27/2016   Procedure: CATARACT EXTRACTION PHACO AND INTRAOCULAR LENS PLACEMENT (IOC);  Surgeon: Birder Robson, MD;  Location: [facility] ORS;  Service: Ophthalmology;  Laterality: Left;  [place] 00:37 AP% 19.1 CDE  7.11 Fluid pack lot # [id number]  . CORONARY/GRAFT ACUTE MI REVASCULARIZATION N/A 07/11/2016   Procedure: Coronary/Graft Acute MI Revascularization;  Surgeon: Yolonda Kida, MD;  Location: [place] [facility];  Service: Cardiovascular;  Laterality: N/A;  . FLEXIBLE BRONCHOSCOPY N/A 05/31/2016   Procedure: FLEXIBLE BRONCHOSCOPY;  Surgeon: Wilhelmina Mcardle, MD;  Location: [facility] ORS;  Service: Pulmonary;  Laterality: N/A;  . JOINT REPLACEMENT    . LEFT HEART CATH AND CORONARY ANGIOGRAPHY N/A 07/11/2016   Procedure: Left Heart Cath and Coronary Angiography;  Surgeon: Yolonda Kida, MD;  Location:  [place] [facility];  Service: Cardiovascular;  Laterality: N/A;  . PORTACATH PLACEMENT  2015  . REPLACEMENT TOTAL KNEE Left 2004-2005?    Prior to Admission medications   Medication Sig Start Date End Date Taking? Authorizing Provider  acetaminophen (TYLENOL) 325 MG tablet Take 650 mg by mouth daily as needed for headache.   Yes [provider]  aspirin 81 MG chewable tablet Chew 1 tablet (81 mg total) by mouth daily. 07/14/16  Yes Fritzi Mandes, MD  atorvastatin (LIPITOR) 80 MG tablet Take 1 tablet (80 mg total) by mouth daily at 6 PM. 07/14/16  Yes Fritzi Mandes, MD  Calcium Carb-Cholecalciferol (CALCIUM-VITAMIN D3) 600-400 MG-UNIT TABS Take 1 tablet by mouth 2 (two) times daily.   Yes [provider]  clonazePAM (KLONOPIN) 0.5 MG tablet TAKE 1 TABLET 3 TIMES DAILY AS NEEDED FOR ANXIETY 07/10/16  Yes Lequita Asal, MD  clopidogrel (PLAVIX) 75 MG tablet Take 1 tablet (75 mg total) by mouth daily with breakfast. 07/15/16  Yes Fritzi Mandes, MD  dexamethasone (DECADRON) 4 MG tablet Take 1 tablet (4 mg total) by mouth 2 (two) times daily with a meal. 06/29/16  Yes Mody, Sital, MD  levETIRAcetam (KEPPRA) 500 MG tablet Take 1 tablet (500 mg total) by mouth 2 (two) times daily. 06/29/16  Yes Mody, Ulice Bold, MD  lisinopril (PRINIVIL,ZESTRIL) 5 MG tablet Take 1 tablet (5 mg total) by mouth daily. 07/15/16  Yes Fritzi Mandes, MD  Magnesium 500 MG TABS Take 500 mg by mouth daily.    Yes [provider]  metoprolol tartrate (LOPRESSOR) 25 MG tablet Take 0.5 tablets (12.5 mg total) by mouth 2 (two) times daily. 07/14/16  Yes Fritzi Mandes, MD  pantoprazole (PROTONIX) 40 MG tablet Take 1 tablet (40 mg total) by mouth daily. 03/23/16  Yes Lequita Asal, MD  phosphorus (K PHOS NEUTRAL) 155-852-130 MG tablet Take 1 tablet (250 mg total) by mouth 2 (two) times daily. 06/23/16  Yes Corcoran, Drue Second, MD  potassium chloride (K-DUR) 10 MEQ tablet TAKE ONE TABLET BY MOUTH EVERY DAY 06/23/16  Yes  Corcoran, Melissa C, MD  temazepam (RESTORIL) 15 MG capsule TAKE 1 CAPSULE AT BEDTIME AS NEEDED FOR SLEEP 07/20/16  Yes Corcoran, Melissa C, MD  loratadine (CLARITIN) 10 MG tablet Take 10 mg by mouth daily as needed for allergies.     [provider]    Allergies Fentanyl  Family History  Problem Relation Age of Onset  . Cancer Father        prostate  . Cancer Mother        Pancreatic  . Cancer Maternal Aunt        breast  . Cancer Cousin        breast  . Cancer Other        lung cancer - neice  . Cancer - Other Daughter        brain    Social History Social History  Substance Use Topics  . Smoking status: Current Every Day Smoker    Packs/day: 0.25    Years: 40.00    Types: Cigarettes  . Smokeless tobacco: Never Used  . Alcohol use No    Review of Systems  Constitutional: Negative for fever. + Generalized weakness and fatigue Eyes: Negative for visual changes. ENT: Negative for sore throat. Neck: No neck pain  Cardiovascular: Negative for chest pain. Respiratory: Negative for shortness of breath. Gastrointestinal: Negative for abdominal pain, vomiting or diarrhea. + Decreased appetite Genitourinary: Negative for dysuria. Musculoskeletal: Negative for back pain. Skin: Negative for rash. Neurological: Negative for headaches, weakness or numbness. Psych: No SI or HI  ____________________________________________   PHYSICAL EXAM:  VITAL SIGNS: ED Triage Vitals  Enc Vitals Group     BP 07/17/2016 1613 115/67     Pulse Rate 07/07/2016 1613 66     Resp 07/13/2016 1613 16     Temp 07/25/2016 1613 98.8 F (37.1 C)     Temp Source 07/26/2016 1613 Oral     SpO2 07/20/2016 1613 97 %     Weight 07/12/2016 1611 129 lb (58.5 kg)     Height 07/22/2016 1611 _0  (1.626 m)     Head Circumference --      Peak Flow --      Pain Score --      Pain Loc --      Pain Edu? --      Excl. in [place]? --     Constitutional: Alert and oriented, chronically ill appearing, no  distress.  HEENT:      Head: Normocephalic and atraumatic.         Eyes: Conjunctivae are normal. Sclera is non-icteric.       Mouth/Throat: Mucous membranes are moist.       Neck: Supple with no signs of meningismus. Cardiovascular: Regular rate and rhythm. No murmurs, gallops, or rubs. 2+ symmetrical distal pulses are present in all extremities. No JVD. Respiratory: Normal respiratory effort. Lungs are clear to auscultation bilaterally. No wheezes, crackles, or rhonchi.  Gastrointestinal: Soft, non tender, and non distended with positive bowel sounds. No rebound or guarding. Musculoskeletal: bilateral symmetric pitting edema Neurologic: Normal speech and language. Face is symmetric. Moving all extremities. No gross focal neurologic deficits are appreciated. Skin: Skin is warm, dry and intact. No rash noted. Psychiatric: Mood and affect are normal. Speech and behavior are normal.  ____________________________________________   LABS (all labs ordered are listed, but only abnormal results are displayed)  Labs Reviewed  CBC WITH DIFFERENTIAL/PLATELET - Abnormal; Notable for the following:       Result Value   RDW 19.2 (*)    Platelets 56 (*)    Monocytes Absolute 0.1 (*)    Basophils Absolute 0.2 (*)    All other components within normal limits  COMPREHENSIVE METABOLIC PANEL - Abnormal; Notable for the following:    Sodium 134 (*)    BUN 33 (*)    Calcium 8.1 (*)    Total Protein 4.9 (*)    Albumin 2.1 (*)    AST 291 (*)    ALT 335 (*)    Alkaline Phosphatase 136 (*)    Total Bilirubin 1.7 (*)    All other components within normal limits  URINALYSIS, COMPLETE (UACMP) WITH MICROSCOPIC - Abnormal; Notable for the following:    Color, Urine AMBER (*)    APPearance CLOUDY (*)    Hgb urine dipstick SMALL (*)    Protein, ur 30 (*)    Squamous Epithelial / LPF 0-5 (*)    All other components within normal limits  TROPONIN I - Abnormal; Notable for the following:    Troponin I  2.99 (*)    All other components within  normal limits  BRAIN NATRIURETIC PEPTIDE - Abnormal; Notable for the following:    B Natriuretic Peptide 475.0 (*)    All other components within normal limits   ____________________________________________  EKG  ED ECG REPORT I, Rudene Re, the attending physician, personally viewed and interpreted this ECG.  Normal sinus rhythm, rate of 62, normal intervals, left axis deviation, LVH, slight ST elevations in the inferior leads, no ST depressions. Improved when compared to prior from 07/12/16 ____________________________________________  RADIOLOGY  CXR: 1. Small bilateral pleural effusions 2. Stable mild cardiomegaly 3. Stable right upper lobe pulmonary nodule  Head CT:  Further decrease in size of intracranial metastatic lesions and surrounding vasogenic edema. Vasogenic edema remains in portions of the right temporal lobe, left frontal lobe and left parietal lobe.  Right caudate head lesion decrease in size with decrease slight flattening of the right frontal horn.  No new intracranial abnormality, hemorrhage or CT evidence of large acute infarct noted.  Opacification right mastoid air cells with breakthrough right lateral wall similar prior exam. Partial opacification right middle ear. [place] incompletely assessed and possibly partially eroded. ____________________________________________   PROCEDURES  Procedure(s) performed: None Procedures Critical Care performed:  None ____________________________________________   INITIAL IMPRESSION / ASSESSMENT AND PLAN / ED COURSE  66 y.o. 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 Patient with metastatic cancer, not eating or drinking, looks dry on exam, no distress otherwise. EKG improved from prior recent  STEMI. Labs showing stable hemoglobin, normal white cells, trending down platelets,LFTs trending up. Troponin 2.99 and BNP elevated at 475. Patient is third spacing with b/l LE edema but looks overall dry on exam with decreased PO intake. CXR pending. Will get head CT since patient has metastasis to the brain and admit.     _________________________ 7:25 PM on 07/25/2016 -----------------------------------------  CT head showing decreased size of intracranial metastatic lesions however still would vasogenic edema which could be contributing to patient's symptoms. Patient remains with soft blood pressures after a liter of normal saline therefore we'll give her another 500. UA with no evidence of urinary tract infection, culture has been sent. Patient be admitted to the hospitalist service.  Pertinent labs & imaging results that were available during my care of the patient were reviewed by me and considered in my medical decision making (see chart for details).    ____________________________________________   FINAL CLINICAL IMPRESSION(S) / ED DIAGNOSES  Final diagnoses:  Generalized weakness  Failure to thrive in adult  Thrombocytopenia (HCC)  Transaminitis      NEW MEDICATIONS STARTED DURING THIS VISIT:  New Prescriptions   No medications on file     Note:  This document was prepared using Dragon voice recognition software and may include unintentional dictation errors.    Alfred Levins, [place], MD 07/19/2016 [phone]

## 2016-07-24 NOTE — Telephone Encounter (Signed)
Called Elizabeth Bond and discussed that Dr. Mike Gip wanted patient to come to ED for evaluation r/t the symptoms, voiced understanding.

## 2016-07-25 ENCOUNTER — Ambulatory Visit: Payer: Medicare Other

## 2016-07-25 ENCOUNTER — Inpatient Hospital Stay: Payer: Medicare Other | Admitting: Hematology and Oncology

## 2016-07-25 DIAGNOSIS — C7931 Secondary malignant neoplasm of brain: Secondary | ICD-10-CM

## 2016-07-25 DIAGNOSIS — Z66 Do not resuscitate: Secondary | ICD-10-CM

## 2016-07-25 DIAGNOSIS — C50919 Malignant neoplasm of unspecified site of unspecified female breast: Secondary | ICD-10-CM

## 2016-07-25 DIAGNOSIS — Z923 Personal history of irradiation: Secondary | ICD-10-CM

## 2016-07-25 DIAGNOSIS — E876 Hypokalemia: Secondary | ICD-10-CM

## 2016-07-25 DIAGNOSIS — C50911 Malignant neoplasm of unspecified site of right female breast: Secondary | ICD-10-CM

## 2016-07-25 DIAGNOSIS — E860 Dehydration: Secondary | ICD-10-CM

## 2016-07-25 DIAGNOSIS — Z7189 Other specified counseling: Secondary | ICD-10-CM

## 2016-07-25 DIAGNOSIS — D890 Polyclonal hypergammaglobulinemia: Secondary | ICD-10-CM

## 2016-07-25 DIAGNOSIS — Z170 Estrogen receptor positive status [ER+]: Secondary | ICD-10-CM

## 2016-07-25 DIAGNOSIS — C7951 Secondary malignant neoplasm of bone: Secondary | ICD-10-CM

## 2016-07-25 DIAGNOSIS — R410 Disorientation, unspecified: Secondary | ICD-10-CM

## 2016-07-25 DIAGNOSIS — I959 Hypotension, unspecified: Secondary | ICD-10-CM

## 2016-07-25 DIAGNOSIS — F17210 Nicotine dependence, cigarettes, uncomplicated: Secondary | ICD-10-CM

## 2016-07-25 DIAGNOSIS — Z515 Encounter for palliative care: Secondary | ICD-10-CM

## 2016-07-25 DIAGNOSIS — Z79899 Other long term (current) drug therapy: Secondary | ICD-10-CM

## 2016-07-25 DIAGNOSIS — R531 Weakness: Secondary | ICD-10-CM

## 2016-07-25 DIAGNOSIS — L8990 Pressure ulcer of unspecified site, unspecified stage: Secondary | ICD-10-CM | POA: Insufficient documentation

## 2016-07-25 LAB — C DIFFICILE QUICK SCREEN W PCR REFLEX
C DIFFICILE (CDIFF) TOXIN: NEGATIVE
C DIFFICLE (CDIFF) ANTIGEN: POSITIVE — AB

## 2016-07-25 LAB — BASIC METABOLIC PANEL
ANION GAP: 8 (ref 5–15)
BUN: 32 mg/dL — ABNORMAL HIGH (ref 6–20)
CALCIUM: 7.5 mg/dL — AB (ref 8.9–10.3)
CO2: 23 mmol/L (ref 22–32)
Chloride: 105 mmol/L (ref 101–111)
Creatinine, Ser: 0.66 mg/dL (ref 0.44–1.00)
Glucose, Bld: 76 mg/dL (ref 65–99)
Potassium: 3.4 mmol/L — ABNORMAL LOW (ref 3.5–5.1)
Sodium: 136 mmol/L (ref 135–145)

## 2016-07-25 LAB — GASTROINTESTINAL PANEL BY PCR, STOOL (REPLACES STOOL CULTURE)

## 2016-07-25 LAB — CBC
HCT: 43.4 % (ref 35.0–47.0)
HEMOGLOBIN: 15 g/dL (ref 12.0–16.0)
MCH: 34 pg (ref 26.0–34.0)
MCHC: 34.5 g/dL (ref 32.0–36.0)
MCV: 98.5 fL (ref 80.0–100.0)
PLATELETS: 46 10*3/uL — AB (ref 150–440)
RBC: 4.41 MIL/uL (ref 3.80–5.20)
RDW: 18.8 % — AB (ref 11.5–14.5)
WBC: 5.1 10*3/uL (ref 3.6–11.0)

## 2016-07-25 LAB — MAGNESIUM: MAGNESIUM: 1.9 mg/dL (ref 1.7–2.4)

## 2016-07-25 LAB — CLOSTRIDIUM DIFFICILE BY PCR: Toxigenic C. Difficile by PCR: POSITIVE — AB

## 2016-07-25 MED ORDER — VANCOMYCIN 50 MG/ML ORAL SOLUTION
125.0000 mg | Freq: Four times a day (QID) | ORAL | Status: DC
  Administered 2016-07-25 – 2016-07-28 (×11): 125 mg via ORAL
  Filled 2016-07-25 (×15): qty 2.5

## 2016-07-25 NOTE — Progress Notes (Signed)
PT Cancellation Note  Patient Details Name: Elizabeth Bond MRN: [id number] DOB: 1950-03-07   Cancelled Treatment:    Reason Eval/Treat Not Completed: Medical issues which prohibited therapy (Consult received and chart reviewed.  Per discussion with primary RN, patient with uncontrolled diarrhea, not feeling well this date (note c-diff positive).  Recommends hold this date with re-attempt next date as medically appropriate.)   [name], PT, DPT, NCS 07/25/16, 3:11 PM [phone]

## 2016-07-25 NOTE — Consult Note (Signed)
Consultation Note Date: 07/25/2016   Patient Name: Elizabeth Bond  DOB: [birth year]  MRN: [id number]  Age / Sex: 66 y.o., female  PCP: Patient, No Pcp Per Referring Physician: Vaughan Basta, *  Reason for Consultation: Establishing goals of care  HPI/Patient Profile: 66 y.o. female  with past medical history of metastatic breast cancer to bone and brain, seizures due to brain mets, recent STEMI s/p cardiac catheterization with PCI, COPD, depression, GERD, arthritis, and IBS admitted on 07/22/2016 with weakness, poor oral intake, and low blood pressure. Followed by Dr. Mike Gip and currently receiving radiation. CT brain shows some improvement in cerebral edema. CXR reveals bilateral pleural effusions with stable RUL pulmonary nodule. Troponin elevated as residual of recent STEMI. Cardiology following. Receiving aspirin and plavix. Hypotension-beta blocker and ACE inhibitor discontinued. Palliative medicine consultation for goals of care.   Clinical Assessment and Goals of Care: I have reviewed medical records, discussed with Dr. Anselm Jungling, and met with patient, daughter Baruch Gouty) and sister Mariann Laster) at bedside to discuss diagnosis, [place], EOL wishes, disposition and options. Patient recently seen by me on 07/13/16-note reviewed.   Introduced Palliative Medicine as specialized medical care for people living with serious illness. It focuses on providing relief from the symptoms and stress of a serious illness. The goal is to improve quality of life for both the patient and the family.  After last hospitalization, patient returned home with grandson and support from daughter and sister. She received radiation treatments last week. This week, daughter speaks of her increased weakness and poor appetite x3-4 days. She refused to go to radiation yesterday. She has been unable to dress and bath herself and does not have an  appetite.   Discussed hospital diagnoses, interventions, and underlying metastatic cancer. Daughter concerned about her loose, bloody stools today. Sample pending.   Advanced directives, concepts specific to code status, and artifical feeding and hydration were discussed. Last hospitalization, Daughter, patient, and I completed MOST form. Patient confirms DNR/DNI but otherwise, full scope treatment. Durable DNR and MOST form placed in chart.   Palliative following outpatient. Meeting was arranged for 08/03/16. Answered questions and concerns. Daughter has my contact information.     SUMMARY OF RECOMMENDATIONS    DNR/DNI. Durable DNR and MOST form placed in chart. Other than DNR/DNI, continue full scope treatment.   Stool sample pending. Daughter waiting to speak with attending this AM.   PMT will continue to follow throughout hospitalization and further discuss GOC/need for hospice services.   Code Status/Advance Care Planning:  DNR  Symptom Management:   Per attending  Palliative Prophylaxis:   Aspiration, Delirium Protocol, Oral Care and Turn Reposition  Additional Recommendations (Limitations, Scope, Preferences):  Full Scope Treatment-besides DNR/DNI  Psycho-social/Spiritual:   Desire for further Chaplaincy support:yes  Additional Recommendations: Caregiving  Support/Resources and Education on Hospice  Prognosis:   Unable to determine: guarded with metastatic breast cancer, decrease in functional/nutritional status, recent STEMI s/p cardiac cath with PCI, and now hypotensive (BB and ACE discontinued).   Discharge Planning: To Be  Determined      Primary Diagnoses: Present on Admission: **None**   I have reviewed the medical record, interviewed the patient and family, and examined the patient. The following aspects are pertinent.  Past Medical History:  Diagnosis Date  . Arthritis   . Brain metastasis ([place]) 07/06/2016  . Breast cancer ([place])    right  . Cancer  ([place]) 2015   breast and bone  . COPD (chronic obstructive pulmonary disease) ([place])   . Depression   . GERD (gastroesophageal reflux disease)   . IBS (irritable bowel syndrome)   . Metastasis to bone ([place]) 09/01/2014   Social History   Social History  . Marital status: Widowed    Spouse name: N/A  . Number of children: N/A  . Years of education: N/A   Social History Main Topics  . Smoking status: Current Every Day Smoker    Packs/day: 0.25    Years: 40.00    Types: Cigarettes  . Smokeless tobacco: Never Used  . Alcohol use No  . Drug use: No  . Sexual activity: Not Asked   Other Topics Concern  . None   Social History Narrative  . None   Family History  Problem Relation Age of Onset  . Cancer Father        prostate  . Cancer Mother        Pancreatic  . Cancer Maternal Aunt        breast  . Cancer Cousin        breast  . Cancer Other        lung cancer - neice  . Cancer - Other Daughter        brain   Scheduled Meds: . aspirin  81 mg Oral Daily  . atorvastatin  80 mg Oral q1800  . calcium-vitamin D  1 tablet Oral BID  . clopidogrel  75 mg Oral Q breakfast  . dexamethasone  4 mg Oral BID WC  . levETIRAcetam  500 mg Oral BID  . magnesium oxide  400 mg Oral Daily  . pantoprazole  40 mg Oral Daily   Continuous Infusions: . sodium chloride 50 mL/hr at 07/22/2016 2317   PRN Meds:.acetaminophen **OR** acetaminophen, acetaminophen, bisacodyl, clonazePAM, HYDROcodone-acetaminophen, loratadine, ondansetron **OR** ondansetron (ZOFRAN) IV, senna-docusate Medications Prior to Admission:  Prior to Admission medications   Medication Sig Start Date End Date Taking? Authorizing Provider  acetaminophen (TYLENOL) 325 MG tablet Take 650 mg by mouth daily as needed for headache.   Yes [provider]  aspirin 81 MG chewable tablet Chew 1 tablet (81 mg total) by mouth daily. 07/14/16  Yes Fritzi Mandes, MD  atorvastatin (LIPITOR) 80 MG tablet Take 1 tablet (80 mg total)  by mouth daily at 6 PM. 07/14/16  Yes Fritzi Mandes, MD  Calcium Carb-Cholecalciferol (CALCIUM-VITAMIN D3) 600-400 MG-UNIT TABS Take 1 tablet by mouth 2 (two) times daily.   Yes [provider]  clonazePAM (KLONOPIN) 0.5 MG tablet TAKE 1 TABLET 3 TIMES DAILY AS NEEDED FOR ANXIETY 07/10/16  Yes Lequita Asal, MD  clopidogrel (PLAVIX) 75 MG tablet Take 1 tablet (75 mg total) by mouth daily with breakfast. 07/15/16  Yes Fritzi Mandes, MD  dexamethasone (DECADRON) 4 MG tablet Take 1 tablet (4 mg total) by mouth 2 (two) times daily with a meal. 06/29/16  Yes Mody, Sital, MD  levETIRAcetam (KEPPRA) 500 MG tablet Take 1 tablet (500 mg total) by mouth 2 (two) times daily. 06/29/16  Yes Bettey Costa, MD  lisinopril (PRINIVIL,ZESTRIL) 5 MG tablet Take 1 tablet (5 mg total) by mouth daily. 07/15/16  Yes Fritzi Mandes, MD  Magnesium 500 MG TABS Take 500 mg by mouth daily.    Yes [provider]  metoprolol tartrate (LOPRESSOR) 25 MG tablet Take 0.5 tablets (12.5 mg total) by mouth 2 (two) times daily. 07/14/16  Yes Fritzi Mandes, MD  pantoprazole (PROTONIX) 40 MG tablet Take 1 tablet (40 mg total) by mouth daily. 03/23/16  Yes Lequita Asal, MD  phosphorus (K PHOS NEUTRAL) 155-852-130 MG tablet Take 1 tablet (250 mg total) by mouth 2 (two) times daily. 06/23/16  Yes Corcoran, Drue Second, MD  potassium chloride (K-DUR) 10 MEQ tablet TAKE ONE TABLET BY MOUTH EVERY DAY 06/23/16  Yes Corcoran, Melissa C, MD  temazepam (RESTORIL) 15 MG capsule TAKE 1 CAPSULE AT BEDTIME AS NEEDED FOR SLEEP 07/20/16  Yes Corcoran, Melissa C, MD  loratadine (CLARITIN) 10 MG tablet Take 10 mg by mouth daily as needed for allergies.     [provider]   Allergies  Allergen Reactions  . Fentanyl Palpitations and Other (See Comments)    "loopy and confused" "feels like heart is going to come out of my chest"   Review of Systems  Constitutional: Positive for activity change, appetite change and fatigue.    Gastrointestinal: Positive for diarrhea.  Neurological: Positive for weakness.   Physical Exam  Constitutional: She is oriented to person, place, and time. She is cooperative. She appears ill.  HENT:  Head: Normocephalic and atraumatic.  Cardiovascular: Regular rhythm.   Pulmonary/Chest: Effort normal. No accessory muscle usage. No tachypnea. No respiratory distress. She has decreased breath sounds.  Abdominal: Normal appearance and bowel sounds are normal.  Bloody stools  Musculoskeletal: She exhibits edema (BLE).  Neurological: She is alert and oriented to person, place, and time.  Skin: Skin is warm and dry.  Psychiatric: She has a normal mood and affect. Her speech is normal and behavior is normal. Cognition and memory are normal.  Nursing note and vitals reviewed.  Vital Signs: BP 121/65 (BP Location: Left Arm)   Pulse 88   Temp (!) 97.5 F (36.4 C) (Oral)   Resp 16   Ht 5' 4"  (1.626 m)   Wt 62.1 kg (137 lb)   SpO2 100%   BMI 23.52 kg/m  Pain Assessment: No/denies pain   Pain Score: 0-No pain  SpO2: SpO2: 100 % O2 Device:SpO2: 100 % O2 Flow Rate: .   IO: Intake/output summary:   Intake/Output Summary (Last 24 hours) at 07/25/16 1023 Last data filed at 07/25/16 2836  Gross per 24 hour  Intake          1735.83 ml  Output                0 ml  Net          1735.83 ml    LBM: Last BM Date: 07/25/16 Baseline Weight: Weight: 58.5 kg (129 lb) Most recent weight: Weight: 62.1 kg (137 lb)     Palliative Assessment/Data: PPS 40%   Flowsheet Rows     Most Recent Value  Intake Tab  Referral Department  Hospitalist  Unit at Time of Referral  Cardiac/Telemetry Unit  Palliative Care Primary Diagnosis  Cancer  Date Notified  07/30/2016  Palliative Care Type  Return patient Palliative Care  Reason for referral  Clarify Goals of Care  Date of Admission  07/26/2016  Date first seen by Palliative Care  07/25/16  #  of days Palliative referral response time  1 Day(s)  # of  days IP prior to Palliative referral  0  Clinical Assessment  Palliative Performance Scale Score  40%  Psychosocial & Spiritual Assessment  Palliative Care Outcomes  Patient/Family meeting held?  Yes  Who was at the meeting?  patient, sister, daughter  Palliative Care Outcomes  Clarified goals of care, Provided psychosocial or spiritual support, Changed CPR status, ACP counseling assistance      Time In: 0930 Time Out: 1020 Time Total: 62mn Greater than 50%  of this time was spent counseling and coordinating care related to the above assessment and plan.  Signed by:  MIhor Dow FNP-C Palliative Medicine Team  Phone: [phone]: [phone]  Please contact Palliative Medicine Team phone at [phone] questions and concerns.  For individual provider: See AShea Evans

## 2016-07-25 NOTE — Progress Notes (Signed)
[facility] [place] NAME: Elizabeth Bond    MR#:  [id number]  DATE OF BIRTH:  Mar 17, 1950  SUBJECTIVE:  CHIEF COMPLAINT:   Chief Complaint  Patient presents with  . 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 On radiation therapy.   ST elevation MI 10 days ago, status post stent.   For last few days have progressive weakness, so brought to emergency room.   CT of the head shows decrease in size of metastasis but still have some surrounding edema.   Chest x-ray was without any acute findings.   She had loose mucousy and bloody stool this morning.- CDiff is positive.  REVIEW OF SYSTEMS:  CONSTITUTIONAL: No fever,positive for fatigue or weakness.  EYES: No blurred or double vision.  EARS, NOSE, AND THROAT: No tinnitus or ear pain.  RESPIRATORY: No cough, shortness of breath, wheezing or hemoptysis.  CARDIOVASCULAR: No chest pain, orthopnea, edema.  GASTROINTESTINAL: No nausea, vomiting,positive for diarrhea and abdominal pain.  GENITOURINARY: No dysuria, hematuria.  ENDOCRINE: No polyuria, nocturia,  HEMATOLOGY: No anemia, easy bruising or bleeding SKIN: No rash or lesion. MUSCULOSKELETAL: No joint pain or arthritis.   NEUROLOGIC: No tingling, numbness, weakness.  PSYCHIATRY: No anxiety or depression.   ROS  DRUG ALLERGIES:   Allergies  Allergen Reactions  . Fentanyl Palpitations and Other (See Comments)    "loopy and confused" "feels like heart is going to come out of my chest"    VITALS:  Blood pressure 121/65, pulse 88, temperature (!) 97.5 F (36.4 C), temperature source Oral, resp. rate 16, height 5\' 4"  (1.626 m), weight 62.1 kg (137 lb), SpO2 100 %.  PHYSICAL EXAMINATION:  GENERAL:  66 y.o.-year-old patient lying in the bed with no acute distress.  EYES: Pupils equal, round, reactive to light and accommodation. No scleral icterus. Extraocular muscles intact.  HEENT: Head atraumatic, normocephalic.  Oropharynx and nasopharynx clear.  NECK:  Supple, no jugular venous distention. No thyroid enlargement, no tenderness.  LUNGS: Normal breath sounds bilaterally, no wheezing, rales,rhonchi or crepitation. No use of accessory muscles of respiration.  CARDIOVASCULAR: S1, S2 normal. No murmurs, rubs, or gallops.  ABDOMEN: Soft, tender, nondistended. Bowel sounds present. No organomegaly or mass.  EXTREMITIES: No pedal edema, cyanosis, or clubbing.  NEUROLOGIC: Cranial nerves II through XII are intact. Muscle strength 3-4/5 in all extremities. Sensation intact. Gait not checked.  PSYCHIATRIC: The patient is alert and oriented x 3.  SKIN: No obvious rash, lesion, or ulcer.   Physical Exam LABORATORY PANEL:   CBC  Recent Labs Lab 07/25/16 0409  WBC 5.1  HGB 15.0  HCT 43.4  PLT 46*   ------------------------------------------------------------------------------------------------------------------  Chemistries   Recent Labs Lab 07/20/2016 1640 07/25/16 0409  NA 134* 136  K 3.8 3.4*  CL 101 105  CO2 26 23  GLUCOSE 87 76  BUN 33* 32*  CREATININE 0.75 0.66  CALCIUM 8.1* 7.5*  MG  --  1.9  AST 291*  --   ALT 335*  --   ALKPHOS 136*  --   BILITOT 1.7*  --    ------------------------------------------------------------------------------------------------------------------  Cardiac Enzymes  Recent Labs Lab 07/02/2016 1640  TROPONINI 2.99*   ------------------------------------------------------------------------------------------------------------------  RADIOLOGY:  Dg Chest 2 View  Result Date: 07/31/2016 CLINICAL DATA:  Breast cancer weakness and fatigue EXAM: CHEST  2 VIEW COMPARISON:  07/11/2016, 12/20/ 2016 FINDINGS: Left-sided central venous port with tip overlying the cavoatrial junction. Small pleural effusions. No  focal consolidation. Mild cardiomegaly with aortic atherosclerosis. Stable 8-9 mm right upper lobe pulmonary nodule. Stable moderate severe compression of  mid to lower thoracic vertebra. IMPRESSION: 1. Small bilateral pleural effusions 2. Stable mild cardiomegaly 3. Stable right upper lobe pulmonary nodule Electronically Signed   By: Donavan Foil M.D.   On: 07/15/2016 18:37   Ct Head Wo Contrast  Result Date: 07/03/2016 CLINICAL DATA:  66 year old female with metastatic breast cancer post treatment with progressive weakness. Subsequent encounter. EXAM: CT HEAD WITHOUT CONTRAST TECHNIQUE: Contiguous axial images were obtained from the base of the skull through the vertex without intravenous contrast. COMPARISON:  07/11/2016 06/28/2016 head CT.  06/28/2016 brain MR. FINDINGS: Brain: Further decrease in size of intracranial metastatic lesions and surrounding vasogenic edema. Vasogenic edema remains in portions of the right temporal lobe, left frontal lobe and left parietal lobe. Right caudate head lesion decrease in size with decrease slight flattening of the right frontal horn. No new intracranial abnormality, hemorrhage or CT evidence of large acute infarct noted. Vascular: Vascular calcifications. Skull: No new focal calvarial abnormality. Sinuses/Orbits: No acute orbital abnormality. Minimal mucosal thickening left maxillary sinus and right sphenoid sinus. Other: Opacification right mastoid air cells with breakthrough right lateral wall similar prior exam. Partial opacification right middle ear. Ossicles incompletely assessed and possibly partially eroded. Retraction right tympanic membrane. IMPRESSION: Further decrease in size of intracranial metastatic lesions and surrounding vasogenic edema. Vasogenic edema remains in portions of the right temporal lobe, left frontal lobe and left parietal lobe. Right caudate head lesion decrease in size with decrease slight flattening of the right frontal horn. No new intracranial abnormality, hemorrhage or CT evidence of large acute infarct noted. Opacification right mastoid air cells with breakthrough right lateral wall  similar prior exam. Partial opacification right middle ear. Ossicles incompletely assessed and possibly partially eroded. Electronically Signed   By: Genia Del M.D.   On: 07/20/2016 18:35    ASSESSMENT AND PLAN:   Active Problems:   Weakness   Pressure injury of skin   Hypotension   DNR (do not resuscitate)  * C. difficile colitis   C. difficile PCR is positive.   Patient also have shiga toxin producing Escherichia coli.   I spoke on phone to infectious disease specialist at [facility], who was covering in absence of our permanent infectious disease doctor.   Due to high risk of developing HUS if treated, suggested no antibiotic to give for Escherichia coli.   At this time treat C. difficile with oral vancomycin 125 mg every 6 hours.   I also discussed the case with GI physician, he suggested follow with antibiotics as she is not a candidate for any invasive workup due to acute colitis.  * Hypertension   Most likely secondary to infection, continue IV fluids and hold hypertensive medications.  * Thrombocytopenia likely secondary to her infection.   Gradual drop in her platelet count for last 2 weeks.   She need to stay on aspirin and Plavix because of recent cardiac catheterization and stenting.   Currently hemoglobin is stable in spite of her having some bleeding due to colitis.   I discussed with cardiologist and he suggested for now to continue aspirin and Plavix both, if more concerned tomorrow then we may stop her aspirin.   Currently renal function is stable and hemoglobin is stable so there is no signs of HUS.   If drops further then I will call hematology consult.  * Elevated troponin   Recent ST  elevation MI- CAD   Cardiologist feel this is not a new event but her troponin coming down from her acute event 2 weeks ago.   Advise no further interventions but to continue her cardiac medications.   Beta blockers were held because of hypotension.   Continue aspirin, Plavix,  atorvastatin.  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   Have some surrounding brain edema but decrease in the size of her metastases.   Palliative care consult is called in because of her multiple complicating factors.  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 Continue Decadron.   All the records are reviewed and case discussed with Care Management/Social Workerr. Management plans discussed with the patient, family and they are in agreement.  CODE STATUS: DNR  TOTAL TIME TAKING CARE OF THIS PATIENT: 40 minutes.   Case discussed with the patient, family, palliative care nurse, infectious disease specialist, GI specialist, cardiologist . Prognosis is poor because of presence of multiple complicating factors.  POSSIBLE D/C IN 2-3 DAYS, DEPENDING ON CLINICAL CONDITION.   Vaughan Basta M.D on 07/25/2016   Between 7am to 6pm - Pager - [phone]  After 6pm go to [email or web address] - password [email or web address] [facility]  Office  [phone]  CC: Primary care physician; Patient, No Pcp Per  Note: This dictation was prepared with Dragon dictation along with smaller phrase technology. Any transcriptional errors that result from this process are unintentional.

## 2016-07-25 NOTE — Consult Note (Signed)
[facility]  Date of admission:  07/14/2016  Inpatient day:  07/25/2016  Consulting physician: Dr. Bettey Costa  Reason for Consultation:  Met cancer to brain with edema  Chief Complaint: Elizabeth Bond is a 66 y.o. 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  HPI:  The patient was last seen in the medical oncology clinic on 07/20/2016.  At that time she was in the midst of whole brain radiation (began 07/06/2016; planned completion on 07/26/2016).  She was fatigued, had hematuria, and 3+ lower extremity edema.  She was on Decadron.  Lower extremity duplex revealed no evidence of DVT.  Urinalysis revealed hematuria.  Culture revealed mixed colonies (repeat requested). LFTs revealed an AST 115, ALT 148, and bilirubin 1.4.  Platelets were 90,000.  Repeat LFTs on 0723/2018 revealed an AST 291, ALT 335, and bilirubin 1.7.  RUQ ultrasound revealed no biliary ductal dilatation and mild hepatic stenosis.  Hepatitis B and C serologies were sent.  Platelets were 56,000.  She denies any new medications except her recent cardiac medications except for her cardiac medications.  She did receive heparin with her cardiac catheterization.  Her family contacted the clinic on 07/29/2016 with the patient "not being right" for 3-4 days.  She was weak in bed, confused, sleeping all of the time, and incontinent.  Her words were "jumbled".  Oral intake was poor. She had had several bowel movements but no apparent "diarrhea". She had "specks of blood"  in her stool. She also had a recent history of hematuria. She had no fever. She was directed to the ER.    Head CT revealed decrease in size of intracranial metastatic lesions and surrounding edema. There were no new abnormalities. Chest x-ray revealed small bilateral effusions and stable mild cardiomegaly.  CBC  revealed a count of 56,000. Liver function test had increased in AST of 291, ALT 335 and alkaline phosphatase 136. Bilirubin was 1.7. Albumin was 2.1.  Troponin was 2.99 (high). BNP was 475 (high). Urinalysis iwas cloudy with to 30 white cells and 30 RBC per high-power field.  Since admission, she has had loose mucousy foul-smelling bloody stool. Stool was positive for C. difficile antigen, toxigenic C. Difficile, and C diff by PCR.  Stool was also positive for Shiga like toxin producing Escherichia coli.  She was started on oral vancomycin.  Urine cultures are pending.  Platetet count is 46,000.  Symptomatically, she is feeling a little better.  Her thinking is clear.    Past Medical History:  Diagnosis Date  . Arthritis   . Brain metastasis ([place]) 07/06/2016  . Breast cancer ([place])    right  . Cancer ([place]) 2015   breast and bone  . COPD (chronic obstructive pulmonary disease) ([place])   . Depression   . GERD (gastroesophageal reflux disease)   . IBS (irritable bowel syndrome)   . 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 ABDOMINAL HYSTERECTOMY  1978  . CATARACT EXTRACTION W/PHACO Right 06/06/2016   Procedure: CATARACT EXTRACTION PHACO AND INTRAOCULAR LENS PLACEMENT (IOC);  Surgeon: Birder Robson, MD;  Location: [facility] ORS;  Service: Ophthalmology;  Laterality: Right;  [place] 00:51.1 AP% 14.0 CDE 7.16 Fluid pack lot # [id number]  . CATARACT EXTRACTION W/PHACO Left 06/27/2016   Procedure: CATARACT EXTRACTION PHACO AND INTRAOCULAR LENS PLACEMENT (IOC);  Surgeon: Birder Robson, MD;  Location:  [facility] ORS;  Service: Ophthalmology;  Laterality: Left;  [place] 00:37 AP% 19.1 CDE  7.11 Fluid pack lot # [id number]  . CORONARY/GRAFT ACUTE MI REVASCULARIZATION N/A 07/11/2016   Procedure: Coronary/Graft Acute MI Revascularization;  Surgeon: Yolonda Kida, MD;  Location: [place] [facility];  Service: Cardiovascular;  Laterality: N/A;  . FLEXIBLE  BRONCHOSCOPY N/A 05/31/2016   Procedure: FLEXIBLE BRONCHOSCOPY;  Surgeon: Wilhelmina Mcardle, MD;  Location: [facility] ORS;  Service: Pulmonary;  Laterality: N/A;  . JOINT REPLACEMENT    . LEFT HEART CATH AND CORONARY ANGIOGRAPHY N/A 07/11/2016   Procedure: Left Heart Cath and Coronary Angiography;  Surgeon: Yolonda Kida, MD;  Location: [place] [facility];  Service: Cardiovascular;  Laterality: N/A;  . PORTACATH PLACEMENT  2015  . REPLACEMENT TOTAL KNEE Left 2004-2005?    Family History  Problem Relation Age of Onset  . Cancer Father        prostate  . Cancer Mother        Pancreatic  . Cancer Maternal Aunt        breast  . Cancer Cousin        breast  . Cancer Other        lung cancer - neice  . Cancer - Other Daughter        brain    Social History:  reports that she has been smoking Cigarettes.  She has a 10.00 pack-year smoking history. She has never used smokeless tobacco. She reports that she does not drink alcohol or use drugs.  The patient is accompanied by her daughter, Baruch Gouty, and 2 other family memebers.  Allergies:  Allergies  Allergen Reactions  . Fentanyl Palpitations and Other (See Comments)    "loopy and confused" "feels like heart is going to come out of my chest"    Medications Prior to Admission  Medication Sig Dispense Refill  . acetaminophen (TYLENOL) 325 MG tablet Take 650 mg by mouth daily as needed for headache.    Marland Kitchen aspirin 81 MG chewable tablet Chew 1 tablet (81 mg total) by mouth daily. 30 tablet 0  . atorvastatin (LIPITOR) 80 MG tablet Take 1 tablet (80 mg total) by mouth daily at 6 PM. 30 tablet 0  . Calcium Carb-Cholecalciferol (CALCIUM-VITAMIN D3) 600-400 MG-UNIT TABS Take 1 tablet by mouth 2 (two) times daily.    . clonazePAM (KLONOPIN) 0.5 MG tablet TAKE 1 TABLET 3 TIMES DAILY AS NEEDED FOR ANXIETY 90 tablet 1  . clopidogrel (PLAVIX) 75 MG tablet Take 1 tablet (75 mg total) by mouth daily with breakfast. 30 tablet 0  . dexamethasone  (DECADRON) 4 MG tablet Take 1 tablet (4 mg total) by mouth 2 (two) times daily with a meal. 60 tablet 0  . levETIRAcetam (KEPPRA) 500 MG tablet Take 1 tablet (500 mg total) by mouth 2 (two) times daily. 60 tablet 0  . lisinopril (PRINIVIL,ZESTRIL) 5 MG tablet Take 1 tablet (5 mg total) by mouth daily. 30 tablet 0  . Magnesium 500 MG TABS Take 500 mg by mouth daily.     . metoprolol tartrate (LOPRESSOR) 25 MG tablet Take 0.5 tablets (12.5 mg total) by mouth 2 (two) times daily. 60 tablet 0  . pantoprazole (PROTONIX) 40 MG tablet Take 1 tablet (40 mg total) by mouth daily. 30 tablet 6  . phosphorus (K PHOS NEUTRAL) 155-852-130 MG tablet Take 1 tablet (250 mg total) by mouth 2 (two) times daily. 60 tablet 2  . potassium chloride (K-DUR) 10 MEQ tablet  TAKE ONE TABLET BY MOUTH EVERY DAY 30 tablet 2  . temazepam (RESTORIL) 15 MG capsule TAKE 1 CAPSULE AT BEDTIME AS NEEDED FOR SLEEP 30 capsule 1  . loratadine (CLARITIN) 10 MG tablet Take 10 mg by mouth daily as needed for allergies.       Review of Systems: GENERAL:  Fatigue.  No fevers or sweats.  No weight loss (fluid weight gain). PERFORMANCE STATUS (ECOG):  3 HEENT:  No visual changes, runny nose, sore throat, mouth sores or tenderness. Lungs: No shortness of breath.  Chronic cough.  No hemoptysis.  Wants nicotine patch. Cardiac:  No chest pain, palpitations, orthopnea, or PND. On multiple cardiac medications s/p stent. GI:  No nausea, vomiting, diarrhea, constipation, melena or hematochezia. GU:  Incontinence.  No urgency, frequency, dysuria, or hematuria. Musculoskeletal:  No shoulder pain. No back pain.  No joint pain.  No muscle tenderness. Extremities:  Lower extremity swelling.  No pain. Skin:  No rashes or skin changes. Neuro:  Confusion yesterday (improved today).  General weakness.  No seizures.  No headache, numbness or weakness, balance or coordination issues. Endocrine:  No diabetes, thyroid issues, hot flashes or night  sweats. Psych:  Insomnia improved on Restoril.  No mood changes, depression or anxiety. Pain:  No focal pain.   Review of systems:  All other systems reviewed and found to be negative.  Physical Exam:  Blood pressure 121/65, pulse 88, temperature (!) 97.5 F (36.4 C), temperature source Oral, resp. rate 16, height _0  (1.626 m), weight 137 lb (62.1 kg), SpO2 100 %.  GENERAL:  Chronically fatigued woman lying comfortably on the medical unit in no acute distress. MENTAL STATUS:  Alert and oriented to person, place [facility]) and time (07/2016). HEAD:  Short gray hair.  Normocephalic, atraumatic, face symmetric, no Cushingoid features. EYES: Hazel eyes.  Pupils equal round and reactive to light and accomodation.  No conjunctivitis or scleral icterus. ENT:  Oropharynx clear without lesion.  Tongue normal. Mucous membranes moist.  RESPIRATORY:  Clear to auscultation without rales, wheezes or rhonchi. CARDIOVASCULAR:  Regular rate and rhythm without murmur, rub or gallop.  No JVD. ABDOMEN:  Soft, slightly tender in the mid abdomen without guarding or rebound tenderness.  Active bowel sounds and no hepatosplenomegaly.  No masses. SKIN:  No rashes, ulcers or lesions. EXTREMITIES: 3+ pitting bilateral lower extremity edema to knees.  No skin discoloration or tenderness.  No palpable cords. LYMPH NODES: No palpable cervical, supraclavicular, axillary or inguinal adenopathy  NEUROLOGICAL:  Follows commands.  Interactive.  Knows the month and year.  Knows the President.  Remembers 3 of 3 words immediately and 1 of 3 words in 5 minutes.  Lower extremity strength symmetric.  Sensation intact. PSYCH:  Appropriate.    Results for orders placed or performed during the hospital encounter of 07/26/2016 (from the past 48 hour(s))  CBC with Differential/Platelet     Status: Abnormal   Collection Time: 07/10/2016  4:40 PM  Result Value Ref Range   WBC 5.8 3.6 - 11.0 K/uL   RBC 4.17 3.80 - 5.20 MIL/uL    Hemoglobin 13.7 12.0 - 16.0 g/dL   HCT 40.3 35.0 - 47.0 %   MCV 96.7 80.0 - 100.0 fL   MCH 33.0 26.0 - 34.0 pg   MCHC 34.1 32.0 - 36.0 g/dL   RDW 19.2 (H) 11.5 - 14.5 %   Platelets 56 (L) 150 - 440 K/uL   Neutrophils Relative % 77 %   Neutro  Abs 4.5 1.4 - 6.5 K/uL   Lymphocytes Relative 18 %   Lymphs Abs 1.0 1.0 - 3.6 K/uL   Monocytes Relative 2 %   Monocytes Absolute 0.1 (L) 0.2 - 0.9 K/uL   Eosinophils Relative 0 %   Eosinophils Absolute 0.0 0 - 0.7 K/uL   Basophils Relative 3 %   Basophils Absolute 0.2 (H) 0 - 0.1 K/uL  Comprehensive metabolic panel     Status: Abnormal   Collection Time: 07/14/2016  4:40 PM  Result Value Ref Range   Sodium 134 (L) 135 - 145 mmol/L   Potassium 3.8 3.5 - 5.1 mmol/L   Chloride 101 101 - 111 mmol/L   CO2 26 22 - 32 mmol/L   Glucose, Bld 87 65 - 99 mg/dL   BUN 33 (H) 6 - 20 mg/dL   Creatinine, Ser 0.75 0.44 - 1.00 mg/dL   Calcium 8.1 (L) 8.9 - 10.3 mg/dL   Total Protein 4.9 (L) 6.5 - 8.1 g/dL   Albumin 2.1 (L) 3.5 - 5.0 g/dL   AST 291 (H) 15 - 41 U/L   ALT 335 (H) 14 - 54 U/L   Alkaline Phosphatase 136 (H) 38 - 126 U/L   Total Bilirubin 1.7 (H) 0.3 - 1.2 mg/dL   GFR calc non Af Amer >60 >60 mL/min   GFR calc Af Amer >60 >60 mL/min    Comment: (NOTE) The eGFR has been calculated using the CKD EPI equation. This calculation has not been validated in all clinical situations. eGFR's persistently <60 mL/min signify possible Chronic Kidney Disease.    Anion gap 7 5 - 15  Troponin I     Status: Abnormal   Collection Time: 07/09/2016  4:40 PM  Result Value Ref Range   Troponin I 2.99 (HH) <0.03 ng/mL    Comment: CRITICAL RESULT CALLED TO, READ BACK BY AND VERIFIED WITH SHANNIN HATCH @ 1729 07/09/2016 BY [facility]   Brain natriuretic peptide     Status: Abnormal   Collection Time: 07/19/2016  4:40 PM  Result Value Ref Range   B Natriuretic Peptide 475.0 (H) 0.0 - 100.0 pg/mL  Urinalysis, Complete w Microscopic     Status: Abnormal   Collection Time:  07/31/2016  6:40 PM  Result Value Ref Range   Color, Urine AMBER (A) YELLOW    Comment: BIOCHEMICALS MAY BE AFFECTED BY COLOR   APPearance CLOUDY (A) CLEAR   Specific Gravity, Urine 1.023 1.005 - 1.030   pH 5.0 5.0 - 8.0   Glucose, UA NEGATIVE NEGATIVE mg/dL   Hgb urine dipstick SMALL (A) NEGATIVE   Bilirubin Urine NEGATIVE NEGATIVE   Ketones, ur NEGATIVE NEGATIVE mg/dL   Protein, ur 30 (A) NEGATIVE mg/dL   Nitrite NEGATIVE NEGATIVE   Leukocytes, UA NEGATIVE NEGATIVE   RBC / HPF 0-5 0 - 5 RBC/hpf   WBC, UA 6-30 0 - 5 WBC/hpf   Bacteria, UA NONE SEEN NONE SEEN   Squamous Epithelial / LPF 0-5 (A) NONE SEEN   Mucous PRESENT   Basic metabolic panel     Status: Abnormal   Collection Time: 07/25/16  4:09 AM  Result Value Ref Range   Sodium 136 135 - 145 mmol/L   Potassium 3.4 (L) 3.5 - 5.1 mmol/L   Chloride 105 101 - 111 mmol/L   CO2 23 22 - 32 mmol/L   Glucose, Bld 76 65 - 99 mg/dL   BUN 32 (H) 6 - 20 mg/dL   Creatinine, Ser 0.66 0.44 - 1.00  mg/dL   Calcium 7.5 (L) 8.9 - 10.3 mg/dL   GFR calc non Af Amer >60 >60 mL/min   GFR calc Af Amer >60 >60 mL/min    Comment: (NOTE) The eGFR has been calculated using the CKD EPI equation. This calculation has not been validated in all clinical situations. eGFR's persistently <60 mL/min signify possible Chronic Kidney Disease.    Anion gap 8 5 - 15  CBC     Status: Abnormal   Collection Time: 07/25/16  4:09 AM  Result Value Ref Range   WBC 5.1 3.6 - 11.0 K/uL   RBC 4.41 3.80 - 5.20 MIL/uL   Hemoglobin 15.0 12.0 - 16.0 g/dL   HCT 43.4 35.0 - 47.0 %   MCV 98.5 80.0 - 100.0 fL   MCH 34.0 26.0 - 34.0 pg   MCHC 34.5 32.0 - 36.0 g/dL   RDW 18.8 (H) 11.5 - 14.5 %   Platelets 46 (L) 150 - 440 K/uL  Magnesium     Status: None   Collection Time: 07/25/16  4:09 AM  Result Value Ref Range   Magnesium 1.9 1.7 - 2.4 mg/dL  Gastrointestinal Panel by PCR , Stool     Status: Abnormal   Collection Time: 07/25/16  9:10 AM  Result Value Ref Range    Campylobacter species NOT DETECTED NOT DETECTED   Plesimonas shigelloides NOT DETECTED NOT DETECTED   Salmonella species NOT DETECTED NOT DETECTED   Yersinia enterocolitica NOT DETECTED NOT DETECTED   Vibrio species NOT DETECTED NOT DETECTED   Vibrio cholerae NOT DETECTED NOT DETECTED   Enteroaggregative E coli (EAEC) NOT DETECTED NOT DETECTED   Enterotoxigenic E coli (ETEC) NOT DETECTED NOT DETECTED   Shiga like toxin producing E coli (STEC) DETECTED (A) NOT DETECTED    Comment: CRITICAL RESULT CALLED TO, READ BACK BY AND VERIFIED WITH: SERENITY KIRKENDAHL ON 07/25/16 AT 1124 QSD    E. coli O157 NOT DETECTED NOT DETECTED   Shigella/Enteroinvasive E coli (EIEC) NOT DETECTED NOT DETECTED   Cryptosporidium NOT DETECTED NOT DETECTED   Cyclospora cayetanensis NOT DETECTED NOT DETECTED   Entamoeba histolytica NOT DETECTED NOT DETECTED   Giardia lamblia NOT DETECTED NOT DETECTED   Adenovirus F40/41 NOT DETECTED NOT DETECTED   Astrovirus NOT DETECTED NOT DETECTED   Norovirus GI/GII NOT DETECTED NOT DETECTED   Rotavirus A NOT DETECTED NOT DETECTED   Sapovirus (I, II, IV, and V) NOT DETECTED NOT DETECTED  C difficile quick scan w PCR reflex     Status: Abnormal   Collection Time: 07/25/16  9:10 AM  Result Value Ref Range   C Diff antigen POSITIVE (A) NEGATIVE   C Diff toxin NEGATIVE NEGATIVE   C Diff interpretation Results are indeterminate. See PCR results.   Clostridium Difficile by PCR     Status: Abnormal   Collection Time: 07/25/16  9:10 AM  Result Value Ref Range   Toxigenic C Difficile by pcr POSITIVE (A) NEGATIVE    Comment: Positive for toxigenic C. difficile with little to no toxin production. Only treat if clinical presentation suggests symptomatic illness.   Dg Chest 2 View  Result Date: 07/06/2016 CLINICAL DATA:  Breast cancer weakness and fatigue EXAM: CHEST  2 VIEW COMPARISON:  07/11/2016, 12/20/ 2016 FINDINGS: Left-sided central venous port with tip overlying the cavoatrial  junction. Small pleural effusions. No focal consolidation. Mild cardiomegaly with aortic atherosclerosis. Stable 8-9 mm right upper lobe pulmonary nodule. Stable moderate severe compression of mid to lower thoracic vertebra. IMPRESSION:  1. Small bilateral pleural effusions 2. Stable mild cardiomegaly 3. Stable right upper lobe pulmonary nodule Electronically Signed   By: Donavan Foil M.D.   On: 07/19/2016 18:37   Ct Head Wo Contrast  Result Date: 07/18/2016 CLINICAL DATA:  66 year old female with metastatic breast cancer post treatment with progressive weakness. Subsequent encounter. EXAM: CT HEAD WITHOUT CONTRAST TECHNIQUE: Contiguous axial images were obtained from the base of the skull through the vertex without intravenous contrast. COMPARISON:  07/11/2016 06/28/2016 head CT.  06/28/2016 brain MR. FINDINGS: Brain: Further decrease in size of intracranial metastatic lesions and surrounding vasogenic edema. Vasogenic edema remains in portions of the right temporal lobe, left frontal lobe and left parietal lobe. Right caudate head lesion decrease in size with decrease slight flattening of the right frontal horn. No new intracranial abnormality, hemorrhage or CT evidence of large acute infarct noted. Vascular: Vascular calcifications. Skull: No new focal calvarial abnormality. Sinuses/Orbits: No acute orbital abnormality. Minimal mucosal thickening left maxillary sinus and right sphenoid sinus. Other: Opacification right mastoid air cells with breakthrough right lateral wall similar prior exam. Partial opacification right middle ear. Ossicles incompletely assessed and possibly partially eroded. Retraction right tympanic membrane. IMPRESSION: Further decrease in size of intracranial metastatic lesions and surrounding vasogenic edema. Vasogenic edema remains in portions of the right temporal lobe, left frontal lobe and left parietal lobe. Right caudate head lesion decrease in size with decrease slight flattening  of the right frontal horn. No new intracranial abnormality, hemorrhage or CT evidence of large acute infarct noted. Opacification right mastoid air cells with breakthrough right lateral wall similar prior exam. Partial opacification right middle ear. Ossicles incompletely assessed and possibly partially eroded. Electronically Signed   By: Genia Del M.D.   On: 07/27/2016 18:35    Assessment:  The patient is a 66 y.o. woman with metastatic Her2/neu + breast cancer.  She presented with weakness, dehydration, hypotension, and confusion.  Recent urinalysis revealed hematuria.  She has had C diff + and E coli bloody diarrhea.  She has progressive thrombocytopenia.  She was diagnosed with brain metastasis on 06/28/2016 after presenting with seizure and encephalopathy.  Head MRI revealed multiple mixed parenchymal and extra-axial intracranial masses. She was started on Keppra and Decadron.  She began whole brain radiation on 07/06/2016.  Plan was for whole brain radiation delivering 3000 cGy in 10 fractions. She has 3 treatments left.  She presented with an acute inferior wall MI on 07/11/2016.  Cardiac catheterization revealed 100% stenosis of the mid right coronary.  A drug eluting stent was placed.  EF was 45-50%.  She was discharged on aspirin, Plavix, lisinopril, and Lipitor.  She has recent increased liver function tests of unclear etiology.  Hepatitis B and C testing are pending (drawn 07/20/2016).  RUQ ultrasound revealed mild hepatic steatosis.  Symptomatically, she is thinking a little better.  She remains weak.  She has mid abdominal discomfort.  Plan:   1.  Oncology:  Patient has metastatic HER-2/neu positive breast cancer. She was recently diagnosed with brain metastasis. She is undergoing cranial radiation. She has missed the last 2 days of radiation.  Continue Decadron.  Head CT reveals improvement.    PET scan on 05/19/2017 revealed innumerable partially cavitary upper lobe pulmonary  nodules, negative by bronchoscopy.  Etiology is unclear.  Etiology of increased liver function test is unclear. Recent right upper quadrant ultrasound revealed only hepatic steatosis.  Hepatitis serologies are pending.  If liver function tests continue to increase would consider an  abdominal CT.  2.  Hematology:  Patient has decreasing platelet count. Etiology is probably secondary to infection. She describes no new medications or herbal products except for her cardiac meds which have been discontinued.  She has been exposed to heparin.  Will rule out heparin-induced thrombocytopenia (HIT), doubt.  Maintain active type and screen given the bloody stool and low platelet count.  She remains on aspirin and Plavix secondary to her recent stent.  No heparin or Lovenox.  Follow CBC daily.  3.  Infectious Disease:  Patient with C. difficile positive stool. She has been started on oral vancomycin. She is on enteric precautions. Urinalysis culture is pending. Low threshold for blood cultures as patient on steroids which suppresses fevers.  4.  Gastroenterology: C. difficile diarrhea on treatment. Patient has increasing liver function tests. Etiology is unclear. Await hepatitis testing performed last week. Consider an abdominal CT scan to rule out metastatic disease if LFTs continue to increase without etiology (see above).  5.  Cardiology:  Patient with recent MI s/p cardiac stent placement.  She remains on aspirin and Plavix.  Appreciate cardiology consult.  She is not a candidate for beta blocker, afterload reduction or nitrate secondary to her hypotension. She is not a candidate for statins due to her nutritional status. She does not appear to have a NSTEMI.  Troponin elevated from recent MI.  6.  Fluids, Electrolytes, and Nutrition:  Patient admits to poor oral intake.  Nutrition consult.  Elevated BUN secondary to dehydration or blood in stool.  7.  Neurology:  Patient with altered mental status on  admission, now near baseline.  Etiology likely secondary to dehydration, hypotension, and infection.  Head CT has improved.  8:  Disposition:  Code stats is DNR.   Thank you for allowing me to participate in MORRISA ALDABA 's care.  I will follow her closely with you while hospitalized and after discharge in the outpatient department.   Lequita Asal, MD  07/25/2016, 3:31 PM

## 2016-07-25 NOTE — Consult Note (Signed)
Elizabeth Bond  CARDIOLOGY CONSULT NOTE  Patient ID: SELDA JALBERT MRN: [id number] DOB/AGE: [birth year] 66 y.o.   Admit date: 07/14/2016 Referring Physician Dr. Anselm Jungling Primary Physician   Primary Cardiologist Dr. Clayborn Bigness Reason for Consultation failure to thrive  HPI: Pt is a 66 yo female with history of copd and breast cancer with brain mets treated with chemo therapy, s/p recent inferior wall stemi which was late presenting and underwent pci of her mid rca with a 2.75 x 38 mm Xience [employer] RX des on 07/11/16 who now presents with weakness, fatigue, hypotension. Her serum troponin was 16 on presentation on 7/10 and peaked at greater than 65. Cath at that time revealed occluded mid rca with insignificant disease in her rca with ef of 45-50%. She was discharged on asa and plavix. She has not felt well since discharge with weakness, fatigue.  She returned with increased confusion and weakness. In the er, her ekg revealed inferior q waves with no ischemic changes. Her troponin was 2.99. She denies chest pain.   Review of Systems  Constitutional: Positive for malaise/fatigue and weight loss.  HENT: Negative.   Eyes: Negative.   Respiratory: Negative.   Cardiovascular: Negative.   Gastrointestinal: Negative.   Genitourinary: Negative.   Musculoskeletal: Negative.   Skin: Negative.   Neurological: Positive for weakness.  Endo/Heme/Allergies: Negative.   Psychiatric/Behavioral: Positive for memory loss.    Past Medical History:  Diagnosis Date  . Arthritis   . Brain metastasis ([place]) 07/06/2016  . Breast cancer ([place])    right  . Cancer ([place]) 2015   breast and bone  . COPD (chronic obstructive pulmonary disease) ([place])   . Depression   . GERD (gastroesophageal reflux disease)   . IBS (irritable bowel syndrome)   . Metastasis to bone ([place]) 09/01/2014    Family History  Problem Relation Age of Onset  . Cancer Father        prostate  .  Cancer Mother        Pancreatic  . Cancer Maternal Aunt        breast  . Cancer Cousin        breast  . Cancer Other        lung cancer - neice  . Cancer - Other Daughter        brain    Social History   Social History  . Marital status: Widowed    Spouse name: N/A  . Number of children: N/A  . Years of education: N/A   Occupational History  . Not on file.   Social History Main Topics  . Smoking status: Current Every Day Smoker    Packs/day: 0.25    Years: 40.00    Types: Cigarettes  . Smokeless tobacco: Never Used  . Alcohol use No  . Drug use: No  . Sexual activity: Not on file   Other Topics Concern  . Not on file   Social History Narrative  . No narrative on file    Past Surgical History:  Procedure Laterality Date  . ABDOMINAL HYSTERECTOMY  1978  . CATARACT EXTRACTION W/PHACO Right 06/06/2016   Procedure: CATARACT EXTRACTION PHACO AND INTRAOCULAR LENS PLACEMENT (IOC);  Surgeon: Birder Robson, MD;  Location: [facility] ORS;  Service: Ophthalmology;  Laterality: Right;  [place] 00:51.1 AP% 14.0 CDE 7.16 Fluid pack lot # [id number]  . CATARACT EXTRACTION W/PHACO Left 06/27/2016   Procedure: CATARACT EXTRACTION PHACO AND INTRAOCULAR LENS PLACEMENT (IOC);  Surgeon: Birder Robson, MD;  Location: [facility] OR[facility];  Service: Ophthalmology;  Laterality: Left;  [place] 00:37 AP% 19.1 CDE  7.11 Fluid pack lot # [id number]  . CORONARY/GRAFT ACUTE MI REVASCULARIZATION N/A 07/11/2016   Procedure: Coronary/Graft Acute MI Revascularization;  Surgeon: Yolonda Kida, MD;  Location: [place] [facility];  Service: Cardiovascular;  Laterality: N/A;  . FLEXIBLE BRONCHOSCOPY N/A 05/31/2016   Procedure: FLEXIBLE BRONCHOSCOPY;  Surgeon: Wilhelmina Mcardle, MD;  Location: [facility] ORS;  Service: Pulmonary;  Laterality: N/A;  . JOINT REPLACEMENT    . LEFT HEART CATH AND CORONARY ANGIOGRAPHY N/A 07/11/2016   Procedure: Left Heart Cath and Coronary Angiography;  Surgeon: Yolonda Kida, MD;  Location:  [place] [facility];  Service: Cardiovascular;  Laterality: N/A;  . PORTACATH PLACEMENT  2015  . REPLACEMENT TOTAL KNEE Left 2004-2005?     Prescriptions Prior to Admission  Medication Sig Dispense Refill Last Dose  . acetaminophen (TYLENOL) 325 MG tablet Take 650 mg by mouth daily as needed for headache.   prn at prn  . aspirin 81 MG chewable tablet Chew 1 tablet (81 mg total) by mouth daily. 30 tablet 0 unknown at unknown  . atorvastatin (LIPITOR) 80 MG tablet Take 1 tablet (80 mg total) by mouth daily at 6 PM. 30 tablet 0 unknown at unknown  . Calcium Carb-Cholecalciferol (CALCIUM-VITAMIN D3) 600-400 MG-UNIT TABS Take 1 tablet by mouth 2 (two) times daily.   unknown at unknown  . clonazePAM (KLONOPIN) 0.5 MG tablet TAKE 1 TABLET 3 TIMES DAILY AS NEEDED FOR ANXIETY 90 tablet 1 unknown at unknown  . clopidogrel (PLAVIX) 75 MG tablet Take 1 tablet (75 mg total) by mouth daily with breakfast. 30 tablet 0 unknown at unknown  . dexamethasone (DECADRON) 4 MG tablet Take 1 tablet (4 mg total) by mouth 2 (two) times daily with a meal. 60 tablet 0 unknown at unknown  . levETIRAcetam (KEPPRA) 500 MG tablet Take 1 tablet (500 mg total) by mouth 2 (two) times daily. 60 tablet 0 unknown at unknown  . lisinopril (PRINIVIL,ZESTRIL) 5 MG tablet Take 1 tablet (5 mg total) by mouth daily. 30 tablet 0 unknown at unknown  . Magnesium 500 MG TABS Take 500 mg by mouth daily.    unknown at unknown  . metoprolol tartrate (LOPRESSOR) 25 MG tablet Take 0.5 tablets (12.5 mg total) by mouth 2 (two) times daily. 60 tablet 0 unknown at unknown  . pantoprazole (PROTONIX) 40 MG tablet Take 1 tablet (40 mg total) by mouth daily. 30 tablet 6 unknown at unknown  . phosphorus (K PHOS NEUTRAL) 155-852-130 MG tablet Take 1 tablet (250 mg total) by mouth 2 (two) times daily. 60 tablet 2 unknown at unknown  . potassium chloride (K-DUR) 10 MEQ tablet TAKE ONE TABLET BY MOUTH EVERY DAY 30 tablet 2 unknown at unknown  . temazepam  (RESTORIL) 15 MG capsule TAKE 1 CAPSULE AT BEDTIME AS NEEDED FOR SLEEP 30 capsule 1 unknown at unknown  . loratadine (CLARITIN) 10 MG tablet Take 10 mg by mouth daily as needed for allergies.    prn at prn    Physical Exam: Blood pressure 130/82, pulse 74, temperature 98.2 F (36.8 C), temperature source Oral, resp. rate 18, height _0  (1.626 m), weight 62.1 kg (137 lb), SpO2 96 %.   Wt Readings from Last 1 Encounters:  07/25/16 62.1 kg (137 lb)     General appearance: cooperative and slowed mentation Head: Normocephalic, without obvious abnormality, atraumatic Resp: rhonchi bibasilar  Chest wall: no tenderness Cardio: regular rate and rhythm GI: soft, non-tender; bowel sounds normal; no masses,  no organomegaly Neurologic: Grossly normal  Labs:   Lab Results  Component Value Date   WBC 5.1 07/25/2016   HGB 15.0 07/25/2016   HCT 43.4 07/25/2016   MCV 98.5 07/25/2016   PLT 46 (L) 07/25/2016    Recent Labs Lab 07/14/2016 1640 07/25/16 0409  NA 134* 136  K 3.8 3.4*  CL 101 105  CO2 26 23  BUN 33* 32*  CREATININE 0.75 0.66  CALCIUM 8.1* 7.5*  PROT 4.9*  --   BILITOT 1.7*  --   ALKPHOS 136*  --   ALT 335*  --   AST 291*  --   GLUCOSE 87 76   Lab Results  Component Value Date   TROPONINI 2.99 ([place]) 07/13/2016      Radiology: cxr showed small bilateral pleural effusions with stable rul pulmonary nodule EKG: nsr with q waves inferiorly  ASSESSMENT AND PLAN:  Pt with history of metastatic breast carcinoma treated with chemo, s/p inerior stemi 7/10 treated with emergent pci after presenting fairly late wityh a des in the mid rca. She is now admitted with weakness, fatigue and general failure to thrive. Her serum troponin of 2.99 is residual from her recent stemi and does not appear to represent a nstemi. She has ruled out for an mi. WIll need to continue to treat with asa 81 mg and plavix 75 mg. Not candidate for beta blocker, after load reduction or nitrates due to  hypotension. Not candidate for statin due to general nutritional status. WIll continue to support hemodynamics with fluid boluses. No further cardiac workup indicated at present. Agree with palliative care evaluation. Continue treatment of Ca per oncology.  Signed: Teodoro Spray MD, [facility] 07/25/2016, 8:17 AM

## 2016-07-25 NOTE — Progress Notes (Signed)
Elizabeth Bond, [place] F 66 year old female, patient was transferred from the ED following c/o of generalized weakness. On admission patient was assisted with moving from ED stretcher to her bed. Patient was A&O X4 and denied being in pain, has a left upper chest port that was accessed in the ED.  She was oriented to her room and  initial admission documentation completed . Patient was allowed to participate on her care plans, pink sleeve was placed on the right arm d/t hx of right mastectomy. Skin was checked with another RN. Patient has red blanchable stage 1 ulcer on her buttock; foam dressing was applied, she also has bruises on her right thigh. Patient remained in NSR without any acute event.

## 2016-07-25 NOTE — Care Management (Signed)
Patient with recent discharge for stemi.  She has metastatic breast cancer and on decadron for cerebral edema.  Admitted with weakness.  found to be hypotensive and has been diagnosed with c diff.  PT consult pending.  Patient unable to participate today due to diarrhea.  Was referred to [facility] last discharge for SN PT Ad[place] and Palliative care.  Palliative care was to see in the home 08/03/2016.  Notified [facility] of admission

## 2016-07-25 NOTE — Progress Notes (Signed)
Please note patient has a pending HOME Palliative consult with appointment set for 07/31/2016 at 9:00 am. CMRN Joni Reining aware. Thank you. Flo Shanks RN, BSN, [place] [facility] [place], [facility] [phone] c

## 2016-07-25 NOTE — Progress Notes (Signed)
Patient having very loose, mucousy, foul smelling, bloody stools and c/o abdominal pain. MD notified. Stool sample obtained and GI panel ordered. Patient placed on enteric isolation precautions. Patient and family educated. Will continue to monitor.

## 2016-07-25 NOTE — Progress Notes (Deleted)
[facility] day: 07/25/2016   Chief Complaint: Elizabeth Bond is a 66 y.o. female with metastatic Her2/neu positive right breast cancer who is seen for sick call visit.  HPI:  The patient was last seen in the medical oncology clinic by me on 07/20/2016.  At that time,  she was fatigued.  She was undergoing cranial radiation.  She had hematuria.  Exam revealed 3+ bilateral lower extremity pitting edema to the knees.  Bilateral lower extremity duplex revealed no evidence of DVT.  She had hematuria.  UA and culture were requested.  Urinalysis revealed moderate hemoglobin and TNTC RBCs.  Urine culture revealed multiple species suggesting contamination.  A repeat collection was requested.    Past Medical History:  Diagnosis Date  . Arthritis   . Brain metastasis ([place]) 07/06/2016  . Breast cancer ([place])    right  . Cancer ([place]) 2015   breast and bone  . COPD (chronic obstructive pulmonary disease) ([place])   . Depression   . GERD (gastroesophageal reflux disease)   . IBS (irritable bowel syndrome)   . Metastasis to bone ([place]) 09/01/2014    Past Surgical History:  Procedure Laterality Date  . ABDOMINAL HYSTERECTOMY  1978  . CATARACT EXTRACTION W/PHACO Right 06/06/2016   Procedure: CATARACT EXTRACTION PHACO AND INTRAOCULAR LENS PLACEMENT (IOC);  Surgeon: Birder Robson, MD;  Location: [facility];  Service: Ophthalmology;  Laterality: Right;  [place] 00:51.1 AP% 14.0 CDE 7.16 Fluid pack lot # [id number]  . CATARACT EXTRACTION W/PHACO Left 06/27/2016   Procedure: CATARACT EXTRACTION PHACO AND INTRAOCULAR LENS PLACEMENT (IOC);  Surgeon: Birder Robson, MD;  Location: [facility];  Service: Ophthalmology;  Laterality: Left;  [place] 00:37 AP% 19.1 CDE  7.11 Fluid pack lot # [id number]  . CORONARY/GRAFT ACUTE MI REVASCULARIZATION N/A 07/11/2016   Procedure: Coronary/Graft Acute MI Revascularization;  Surgeon: Yolonda Kida, MD;  Location: [facility];   Service: Cardiovascular;  Laterality: N/A;  . FLEXIBLE BRONCHOSCOPY N/A 05/31/2016   Procedure: FLEXIBLE BRONCHOSCOPY;  Surgeon: Wilhelmina Mcardle, MD;  Location: [facility];  Service: Pulmonary;  Laterality: N/A;  . JOINT REPLACEMENT    . LEFT HEART CATH AND CORONARY ANGIOGRAPHY N/A 07/11/2016   Procedure: Left Heart Cath and Coronary Angiography;  Surgeon: Yolonda Kida, MD;  Location: [facility];  Service: Cardiovascular;  Laterality: N/A;  . PORTACATH PLACEMENT  2015  . REPLACEMENT TOTAL KNEE Left 2004-2005?    Family History  Problem Relation Age of Onset  . Cancer Father        prostate  . Cancer Mother        Pancreatic  . Cancer Maternal Aunt        breast  . Cancer Cousin        breast  . Cancer Other        lung cancer - neice  . Cancer - Other Daughter        brain    Social History:  reports that she has been smoking Cigarettes.  She has a 10.00 pack-year smoking history. She has never used smokeless tobacco. She reports that she does not drink alcohol or use drugs.  Her daughter's name is IT trainer.  She lives in [place].  The patient is accompanied by her daughter and son-in-law, Ronalee Belts, today.  Allergies:  Allergies  Allergen Reactions  . Fentanyl Palpitations and Other (See Comments)    "loopy and confused" "feels like heart is going to come  out of my chest"    Current Medications: No current facility-administered medications for this visit.    No current outpatient prescriptions on file.   Facility-Administered Medications Ordered in Other Visits  Medication Dose Route Frequency Provider Last Rate Last Dose  . 0.9 %  sodium chloride infusion   Intravenous Continuous Bettey Costa, MD 50 mL/hr at 07/14/2016 2317    . acetaminophen (TYLENOL) tablet 650 mg  650 mg Oral Q6H PRN Bettey Costa, MD       Or  . acetaminophen (TYLENOL) suppository 650 mg  650 mg Rectal Q6H PRN Bettey Costa, MD      . acetaminophen (TYLENOL) tablet 650 mg  650 mg Oral Daily  PRN Bettey Costa, MD      . aspirin chewable tablet 81 mg  81 mg Oral Daily Mody, Sital, MD      . atorvastatin (LIPITOR) tablet 80 mg  80 mg Oral q1800 Mody, Sital, MD      . bisacodyl (DULCOLAX) EC tablet 5 mg  5 mg Oral Daily PRN Bettey Costa, MD      . calcium-vitamin D 500-200 MG-UNIT per tablet 1 tablet  1 tablet Oral BID Bettey Costa, MD   1 tablet at 07/29/2016 2336  . clonazePAM (KLONOPIN) tablet 0.25 mg  0.25 mg Oral BID PRN Bettey Costa, MD   0.25 mg at 07/04/2016 2317  . clopidogrel (PLAVIX) tablet 75 mg  75 mg Oral Q breakfast Mody, Sital, MD      . dexamethasone (DECADRON) tablet 4 mg  4 mg Oral BID WC Mody, Sital, MD      . HYDROcodone-acetaminophen (NORCO/VICODIN) 5-325 MG per tablet 1-2 tablet  1-2 tablet Oral Q4H PRN Benjie Karvonen, Sital, MD      . levETIRAcetam (KEPPRA) tablet 500 mg  500 mg Oral BID Bettey Costa, MD   500 mg at 07/29/2016 2317  . loratadine (CLARITIN) tablet 10 mg  10 mg Oral Daily PRN Mody, Sital, MD      . magnesium oxide (MAG-OX) tablet 400 mg  400 mg Oral Daily Mody, Sital, MD      . ondansetron (ZOFRAN) tablet 4 mg  4 mg Oral Q6H PRN Mody, Sital, MD       Or  . ondansetron (ZOFRAN) injection 4 mg  4 mg Intravenous Q6H PRN Mody, Sital, MD      . pantoprazole (PROTONIX) EC tablet 40 mg  40 mg Oral Daily Mody, Sital, MD      . senna-docusate (Senokot-S) tablet 1 tablet  1 tablet Oral QHS PRN Bettey Costa, MD        Review of Systems:  GENERAL:  Fatigue.  No fevers or sweats.  Weight up 13 pound. PERFORMANCE STATUS (ECOG):  3 HEENT:  No visual changes, runny nose, sore throat, mouth sores or tenderness. Lungs: No shortness of breath.  Chronic cough.  No hemoptysis. Cardiac:  No chest pain, palpitations, orthopnea, or PND.  Blood pressure up as worried about scans GI:  No nausea, vomiting, diarrhea, constipation, melena or hematochezia. GU:  Continence issues.  No urgency, frequency, dysuria, or hematuria. Musculoskeletal:  No shoulder pain. No back pain.  No joint pain.   No muscle tenderness. Extremities:  Lower extremity swelling.  No pain. Skin:  No rashes or skin changes. Neuro:  General weakness.  No seizures.  No headache, numbness or weakness, balance or coordination issues. Endocrine:  No diabetes, thyroid issues, hot flashes or night sweats. Psych:  Insomnia improved on Restoril.  Drinks coffee  at night.  No mood changes, depression or anxiety. Pain:  No focal pain.   Review of systems:  All other systems reviewed and found to be negative.   Physical Exam: There were no vitals taken for this visit. GENERAL:  Chronically fatigued woman sitting comfortably in a wheelchair in the exam room in no acute distress. MENTAL STATUS:  Alert and oriented to person, place and time. HEAD:  Short styled gray hair.  Normocephalic, atraumatic, face symmetric, no Cushingoid features. EYES: Hazel eyes.  Pupils equal round and reactive to light and accomodation.  No conjunctivitis or scleral icterus. ENT:  Oropharynx clear without lesion.  Tongue normal. Mucous membranes moist.  RESPIRATORY:  Clear to auscultation without rales, wheezes or rhonchi. CARDIOVASCULAR:  Regular rate and rhythm without murmur, rub or gallop.  No JVD. ABDOMEN:  Soft, non-tender, with active bowel sounds, and no hepatosplenomegaly.  No masses. SKIN:  No rashes, ulcers or lesions. EXTREMITIES: 3+ edema to knees bilaterally.  No skin discoloration or tenderness.  No palpable cords. LYMPH NODES: No palpable cervical, supraclavicular, axillary or inguinal adenopathy  NEUROLOGICAL:  Lower extremity strength decreased proximally (left > right).  Sensation intact.  Patellar reflexes symmetric. PSYCH:  Appropriate.   Imaging studies: 08/28/2014:  PET scan revealed significant interval worsening and multifocal osseous metastatic disease. Lesions had increased in number and metabolic activity. There were no extra osseous metastasis. 02/08/2015:  PET scan revealed a complete metabolic response in the  sclerotic osseous metastases, with no residual hypermetabolic metastatic disease.   08/23/2015:  PET scan revealed no new or progressive hypermetabolic metastatic disease.  07/30/2015:  Bone scan revealed multifocal osseous metastases (sternum, thoracolumbar spine, multiple ribs, and right iliac crest).  The dominant left acetabular metastasis noted on prior PET was not evident. Some of the other sites of disease appear to have progressed.  There was no prior bone scan for comparison. 08/23/2015:  PET scan revealed no new or progressive hypermetabolic disease.  There was stable mildly hypermetabolic sclerotic metastasis in the right proximal femoral metaphysis.  There was stable nonspecific mild patchy hypermetabolism in the upper and lower spine correlating with mixed lytic and sclerotic osseous metastases in the CT images 02/16/2016:  Bone scan revealed multiple osseous metastasis with distribution of metastasis similar to that seen on the previous exam. 05/19/2016:  PET scan revealed similar appearance of osseous metastasis with mild decrease in the index lesion hypermetabolism.  There was no evidence of soft tissue metastasis.  There was the development of innumerable, partially cavitary upper lobe predominant pulmonary nodules.    Admission on 07/02/2016  Component Date Value Ref Range Status  . WBC 07/21/2016 5.8  3.6 - 11.0 K/uL Final  . RBC 07/22/2016 4.17  3.80 - 5.20 MIL/uL Final  . Hemoglobin 07/11/2016 13.7  12.0 - 16.0 g/dL Final  . HCT 07/29/2016 40.3  35.0 - 47.0 % Final  . MCV 08/01/2016 96.7  80.0 - 100.0 fL Final  . MCH 07/02/2016 33.0  26.0 - 34.0 pg Final  . MCHC 07/29/2016 34.1  32.0 - 36.0 g/dL Final  . RDW 07/21/2016 19.2* 11.5 - 14.5 % Final  . Platelets 07/07/2016 56* 150 - 440 K/uL Final  . Neutrophils Relative % 07/23/2016 77  % Final  . Neutro Abs 07/05/2016 4.5  1.4 - 6.5 K/uL Final  . Lymphocytes Relative 07/05/2016 18  % Final  . Lymphs Abs 07/05/2016 1.0  1.0 -  3.6 K/uL Final  . Monocytes Relative 07/06/2016 2  % Final  .  Monocytes Absolute 07/16/2016 0.1* 0.2 - 0.9 K/uL Final  . Eosinophils Relative 07/05/2016 0  % Final  . Eosinophils Absolute 07/13/2016 0.0  0 - 0.7 K/uL Final  . Basophils Relative 07/10/2016 3  % Final  . Basophils Absolute 07/15/2016 0.2* 0 - 0.1 K/uL Final  . Sodium 07/27/2016 134* 135 - 145 mmol/L Final  . Potassium 07/21/2016 3.8  3.5 - 5.1 mmol/L Final  . Chloride 07/15/2016 101  101 - 111 mmol/L Final  . CO2 07/10/2016 26  22 - 32 mmol/L Final  . Glucose, Bld 07/22/2016 87  65 - 99 mg/dL Final  . BUN 07/30/2016 33* 6 - 20 mg/dL Final  . Creatinine, Ser 07/12/2016 0.75  0.44 - 1.00 mg/dL Final  . Calcium 07/13/2016 8.1* 8.9 - 10.3 mg/dL Final  . Total Protein 07/31/2016 4.9* 6.5 - 8.1 g/dL Final  . Albumin 07/15/2016 2.1* 3.5 - 5.0 g/dL Final  . AST 07/16/2016 291* 15 - 41 U/L Final  . ALT 07/23/2016 335* 14 - 54 U/L Final  . Alkaline Phosphatase 07/16/2016 136* 38 - 126 U/L Final  . Total Bilirubin 07/19/2016 1.7* 0.3 - 1.2 mg/dL Final  . GFR calc non Af Amer 07/16/2016 >60  >60 mL/min Final  . GFR calc Af Amer 07/29/2016 >60  >60 mL/min Final   Comment: (NOTE) The eGFR has been calculated using the CKD EPI equation. This calculation has not been validated in all clinical situations. eGFR's persistently <60 mL/min signify possible Chronic Kidney Disease.   . Anion gap 07/03/2016 7  5 - 15 Final  . Color, Urine 07/17/2016 AMBER* YELLOW Final   BIOCHEMICALS MAY BE AFFECTED BY COLOR  . APPearance 07/18/2016 CLOUDY* CLEAR Final  . Specific Gravity, Urine 07/11/2016 1.023  1.005 - 1.030 Final  . pH 07/20/2016 5.0  5.0 - 8.0 Final  . Glucose, UA 07/12/2016 NEGATIVE  NEGATIVE mg/dL Final  . Hgb urine dipstick 07/17/2016 SMALL* NEGATIVE Final  . Bilirubin Urine 07/29/2016 NEGATIVE  NEGATIVE Final  . Ketones, ur 07/11/2016 NEGATIVE  NEGATIVE mg/dL Final  . Protein, ur 07/11/2016 30* NEGATIVE mg/dL Final  . Nitrite  07/05/2016 NEGATIVE  NEGATIVE Final  . Leukocytes, UA 07/17/2016 NEGATIVE  NEGATIVE Final  . RBC / HPF 07/29/2016 0-5  0 - 5 RBC/hpf Final  . WBC, UA 07/29/2016 6-30  0 - 5 WBC/hpf Final  . Bacteria, UA 07/04/2016 NONE SEEN  NONE SEEN Final  . Squamous Epithelial / LPF 07/21/2016 0-5* NONE SEEN Final  . Mucous 07/06/2016 PRESENT   Final  . Troponin I 07/10/2016 2.99* <0.03 ng/mL Final   Comment: CRITICAL RESULT CALLED TO, READ BACK BY AND VERIFIED WITH SHANNIN HATCH @ 1729 07/12/2016 BY [facility]   . B Natriuretic Peptide 08/01/2016 475.0* 0.0 - 100.0 pg/mL Final  . Sodium 07/25/2016 136  135 - 145 mmol/L Final  . Potassium 07/25/2016 3.4* 3.5 - 5.1 mmol/L Final  . Chloride 07/25/2016 105  101 - 111 mmol/L Final  . CO2 07/25/2016 23  22 - 32 mmol/L Final  . Glucose, Bld 07/25/2016 76  65 - 99 mg/dL Final  . BUN 07/25/2016 32* 6 - 20 mg/dL Final  . Creatinine, Ser 07/25/2016 0.66  0.44 - 1.00 mg/dL Final  . Calcium 07/25/2016 7.5* 8.9 - 10.3 mg/dL Final  . GFR calc non Af Amer 07/25/2016 >60  >60 mL/min Final  . GFR calc Af Amer 07/25/2016 >60  >60 mL/min Final   Comment: (NOTE) The eGFR has been calculated using the  CKD EPI equation. This calculation has not been validated in all clinical situations. eGFR's persistently <60 mL/min signify possible Chronic Kidney Disease.   . Anion gap 07/25/2016 8  5 - 15 Final  . WBC 07/25/2016 5.1  3.6 - 11.0 K/uL Final  . RBC 07/25/2016 4.41  3.80 - 5.20 MIL/uL Final  . Hemoglobin 07/25/2016 15.0  12.0 - 16.0 g/dL Final  . HCT 07/25/2016 43.4  35.0 - 47.0 % Final  . MCV 07/25/2016 98.5  80.0 - 100.0 fL Final  . MCH 07/25/2016 34.0  26.0 - 34.0 pg Final  . MCHC 07/25/2016 34.5  32.0 - 36.0 g/dL Final  . RDW 07/25/2016 18.8* 11.5 - 14.5 % Final  . Platelets 07/25/2016 46* 150 - 440 K/uL Final    Assessment:  BERETTA GINSBERG is a 66 y.o. female with metastatic Her2/neu positive right breast cancer.  She initially presented in 03/2013 with an ulcerated  breast mass and back pain.  Breast biopsy revealed lobular breast cancer.  Tumor was ER/PR positive and HER-2/neu positive. PET scan revealed bone metastasis. She received palliative radiation to T8 and T10 from 04/02-04/15/2015.  She declined chemotherapy. She initially began letrozole and Tykerb. Tykerb was discontinued after 1 dose. She received letrozole from 05/02/2013 until 01/07/2014 .  She received Herceptin from 06/02/2013 until 12/15/2013. She received fulvestrant monthly from 01/07/2014 until 05/27/2014.    She has received Xgeva monthly (03/31/2013 until 08/25/2014; last 05/04/2016).  She was switched to every 6 weeks Xgeva to coordinate with her treatment.  Herceptin was discontinued for a rising CA-27-29.  CA27.29 was 916.3 on 03/19/2013, 286.2 on 06/23/2013, 70.8 on 08/25/2013, 56.3 on 10/08/2013, 102.7 on 12/15/2013, 149.4 on 01/07/2014, 178.0 on 02/04/2014, 190.7 on 03/30/2014, 143.2 on 04/22/2014, 134.8 on 05/20/2014, 248.3 on 07/09/2014, 723.2 on 08/25/2014, 252.3 on 10/08/2014, 126 on 10/29/2014, 71.3 on 11/24/2014, 55 on 12/15/2014, 70.6 on 01/05/2015, 48.6 on 01/26/2015, 52.7 on 02/15/2015, 54.7 on 03/30/2015, 54 on 04/20/2015, 55.2 on 06/01/2015, 48.8 on 06/22/2015, 49.7 08/03/2015, 44.6 on 08/24/2015, 44.5 on 09/16/2015, 44.8 on 10/07/2015, 52.1 on 10/28/2015, 47.0 on 11/18/2015, 49.9 on 12/09/2015, 46.7 on 12/30/2015, 53.3 on 02/03/2016, 49.3 on 02/24/2016, 56.3 on 03/23/2016, 57.3 on 04/27/2016, 65.7 on 05/04/2016, 73.9 on 05/25/2016, and 101.1 on 06/23/2016.  She received Ibrance and Faslodex from 02/23/2014 until 07/27/2014.    Echo on 09/04/2014 revealed an EF of 55-65%.  Echo on 12/14/2014, 03/15/2015, 06/15/2015 revealed an EF of 55-60%, 50% on 09/23/2015, 60-65% on 11/16/2015, 60-65% on 02/16/2016, and 60-65% on 05/19/2016.   She is s/p 28 cycles of Kadcyla (09/10/2014 - 05/04/2016).  She is tolerating treatment well.  She denies any bone pain.   PET scan on 05/19/2016  revealed similar appearance of osseous metastasis with mild decrease in the index lesion hypermetabolism.  There was no evidence of soft tissue metastasis.  There was the development of innumerable, partially cavitary upper lobe predominant pulmonary nodules.   Bronchoscopy on 05/31/2016 revealed no evidence of tumor or infection.  She was admitted to [facility] from 12/22/2014 - 12/24/2014 with E coli sepsis secondary to a UTI.  Serum protein was elevated.  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  She was admitted to [facility] from 06/28/2016 - 06/29/2016 with a seizure and encephalopathy.  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 There was extensive vasogenic edema surrounding the larger lesions of the bilateral frontal lobes and left parietal occipital lesions. There was no midline shift  or hydrocephalus.  She was started on Keppra and Decadron.  She began whole brain radiation on 07/06/2016.  Plan was for whole brain radiation delivering 3000 cGy in 10 fractions.  Last treatment is tentatively 07/26/2016.  She was admitted to [facility] from 07/11/2016 - 07/14/2016 with headache and an acute inferior wall MI.  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  A drug eluting stent was placed in the mid RCA.  EF was 45-50%.  She was discharged on aspirin, Plavix, lisinopril, and Lipitor.  She has intermittent electrolytes issues (hypokalemia, hypomagnesemia, and hypocalcemia).  She is on supplementation.  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 Total bilirubin is 1.3 today.  Symptomatically, she is fatigued.  She is undergoing cranial radiation.  She has hematuria.  Exam reveals 3+ bilateral lower extremity pitting edema to the knees.  Plan: 1.  Labs today:  CBC with diff, CMP, Mg. 2.  Urinalysis and  culture. 3.  Discuss interim hospitalizations, diagnosis of brain metastasis undergoing XRT and MI s/p stent.  Discuss management of systemic disease, but progression in CNS.  Discuss discontinuation of Kadcyla.  Kadcyla reported to increase cerebral edema s/p brain radiation with pathology revealing necrosis.  Discuss plan to switch to Xeloda and lapatinib in future.  Discuss EF.  Anticipate repeat echo. 4.  Follow-up as scheduled with Dr. Clayborn Bigness, cardiologist, tomorrow. 5.  Anticipate reimaging of chest to assess cavitary pulmonary nodules. 6.  No chemotherapy today. 7.  Bilateral lower extremity duplex 8.  RTC in 1 week for MD assessment.  Addendum:  Bilateral lower extremity duplex today revealed no evidence of DVT.  LFTs are increased.  Check hepatitis serologies.  RUQ ultrasound.  If negative, check CT scan.   Lequita Asal, MD  07/25/2016, 6:27 AM

## 2016-07-26 ENCOUNTER — Ambulatory Visit: Payer: Medicare Other

## 2016-07-26 DIAGNOSIS — R740 Nonspecific elevation of levels of transaminase and lactic acid dehydrogenase [LDH]: Secondary | ICD-10-CM

## 2016-07-26 DIAGNOSIS — R7401 Elevation of levels of liver transaminase levels: Secondary | ICD-10-CM

## 2016-07-26 DIAGNOSIS — D696 Thrombocytopenia, unspecified: Secondary | ICD-10-CM

## 2016-07-26 DIAGNOSIS — A498 Other bacterial infections of unspecified site: Secondary | ICD-10-CM

## 2016-07-26 LAB — URINE CULTURE

## 2016-07-26 LAB — COMPREHENSIVE METABOLIC PANEL
ALT: 323 U/L — AB (ref 14–54)
AST: 266 U/L — AB (ref 15–41)
Albumin: 1.9 g/dL — ABNORMAL LOW (ref 3.5–5.0)
Alkaline Phosphatase: 140 U/L — ABNORMAL HIGH (ref 38–126)
Anion gap: 7 (ref 5–15)
BILIRUBIN TOTAL: 2.5 mg/dL — AB (ref 0.3–1.2)
BUN: 53 mg/dL — AB (ref 6–20)
CALCIUM: 7.5 mg/dL — AB (ref 8.9–10.3)
CO2: 23 mmol/L (ref 22–32)
CREATININE: 1.48 mg/dL — AB (ref 0.44–1.00)
Chloride: 107 mmol/L (ref 101–111)
GFR, EST AFRICAN AMERICAN: 41 mL/min — AB (ref >60)
GFR, EST NON AFRICAN AMERICAN: 35 mL/min — AB (ref >60)
Glucose, Bld: 84 mg/dL (ref 65–99)
Potassium: 4.2 mmol/L (ref 3.5–5.1)
Sodium: 137 mmol/L (ref 135–145)
TOTAL PROTEIN: 4.5 g/dL — AB (ref 6.5–8.1)

## 2016-07-26 LAB — TYPE AND SCREEN
ABO/RH(D): A POS
Antibody Screen: NEGATIVE

## 2016-07-26 LAB — CBC
HEMATOCRIT: 46.7 % (ref 35.0–47.0)
Hemoglobin: 15.9 g/dL (ref 12.0–16.0)
MCH: 32.9 pg (ref 26.0–34.0)
MCHC: 34 g/dL (ref 32.0–36.0)
MCV: 96.9 fL (ref 80.0–100.0)
Platelets: 40 10*3/uL — ABNORMAL LOW (ref 150–440)
RBC: 4.82 MIL/uL (ref 3.80–5.20)
RDW: 18.4 % — AB (ref 11.5–14.5)
WBC: 5.5 10*3/uL (ref 3.6–11.0)

## 2016-07-26 LAB — PATHOLOGIST SMEAR REVIEW

## 2016-07-26 NOTE — Plan of Care (Signed)
Problem: Pain Managment: Goal: General experience of comfort will improve Outcome: Progressing No complaints of paint this shift, pt slept pretty much all of this shift, pt had only 2 loose stools this shift, much less than day shift. Pt states she feels a little better this morning.

## 2016-07-26 NOTE — Progress Notes (Signed)
Daily Progress Note   Patient Name: Elizabeth Bond       Date: 07/26/2016 DOB: [birth year]  Age: 66 y.o. MRN#: [id number] Attending Physician: Vaughan Basta, * Primary Care Physician: Patient, No Pcp Per Admit Date: 07/18/2016  Reason for Consultation/Follow-up: Establishing goals of care  Subjective/GOC: Patient awake, alert, and oriented. Denies pain or discomfort. Feels "better" today. Decreased diarrhea. Has not yet worked with physical therapy. Tolerating liquids.   Daughter and sister at bedside. Discussed diagnoses and interventions. Asked patient her thoughts on what the doctors have told her. "Just want to get this over with" and "get better" in regards to clearing the infection. Daughter is hopeful to finish radiation if infection clears. She is very weak and they are waiting for physical therapy to work with her soon. Answered questions and concerns. Reassured of continued support from palliative care during hospitalization to discuss plan moving forward.   Length of Stay: 2  Current Medications: Scheduled Meds:  . aspirin  81 mg Oral Daily  . atorvastatin  80 mg Oral q1800  . calcium-vitamin D  1 tablet Oral BID  . clopidogrel  75 mg Oral Q breakfast  . dexamethasone  4 mg Oral BID WC  . levETIRAcetam  500 mg Oral BID  . magnesium oxide  400 mg Oral Daily  . pantoprazole  40 mg Oral Daily  . vancomycin  125 mg Oral Q6H    Continuous Infusions: . sodium chloride 50 mL/hr at 07/25/16 1946    PRN Meds: acetaminophen **OR** acetaminophen, acetaminophen, bisacodyl, clonazePAM, HYDROcodone-acetaminophen, loratadine, ondansetron **OR** ondansetron (ZOFRAN) IV, senna-docusate  Physical Exam  Constitutional: She is oriented to person, place, and time. She is cooperative.    HENT:  Head: Normocephalic and atraumatic.  Cardiovascular: Regular rhythm.   Pulmonary/Chest: Effort normal.  Abdominal: Normal appearance. There is no tenderness.  Neurological: She is alert and oriented to person, place, and time.  Skin: Skin is warm and dry.  Psychiatric: She has a normal mood and affect. Her speech is normal and behavior is normal. Cognition and memory are normal.  Nursing note and vitals reviewed.          Vital Signs: BP 117/64 (BP Location: Left Arm)   Pulse 80   Temp 98.2 F (36.8 C) (Oral)   Resp 16   Ht  5\' 4"  (1.626 m)   Wt 65.3 kg (143 lb 14.4 oz)   SpO2 99%   BMI 24.70 kg/m  SpO2: SpO2: 99 % O2 Device: O2 Device: Not Delivered O2 Flow Rate:    Intake/output summary:   Intake/Output Summary (Last 24 hours) at 07/26/16 1016 Last data filed at 07/26/16 2671  Gross per 24 hour  Intake          1485.83 ml  Output                0 ml  Net          1485.83 ml   LBM: Last BM Date: 07/25/16 Baseline Weight: Weight: 58.5 kg (129 lb) Most recent weight: Weight: 65.3 kg (143 lb 14.4 oz)    Palliative Assessment/Data: PPS 40%   Flowsheet Rows     Most Recent Value  Intake Tab  Referral Department  Hospitalist  Unit at Time of Referral  Cardiac/Telemetry Unit  Palliative Care Primary Diagnosis  Cancer  Date Notified  07/18/2016  Palliative Care Type  Return patient Palliative Care  Reason for referral  Clarify Goals of Care  Date of Admission  07/08/2016  Date first seen by Palliative Care  07/25/16  # of days Palliative referral response time  1 Day(s)  # of days IP prior to Palliative referral  0  Clinical Assessment  Palliative Performance Scale Score  40%  Psychosocial & Spiritual Assessment  Palliative Care Outcomes  Patient/Family meeting held?  Yes  Who was at the meeting?  patient, sister, daughter  Palliative Care Outcomes  Clarified goals of care, Provided psychosocial or spiritual support, Changed CPR status, ACP counseling  assistance      Patient Active Problem List   Diagnosis Date Noted  . Pressure injury of skin 07/25/2016  . Hypotension   . DNR (do not resuscitate)   . Abnormal liver function tests 07/03/2016  . Weakness 07/19/2016  . Chronic intractable headache   . Palliative care by specialist   . Malnutrition of moderate degree 07/13/2016  . Acute ST elevation myocardial infarction (STEMI) involving right coronary artery without development of Q waves ([place]) 07/11/2016  . STEMI (ST elevation myocardial infarction) ([place]) 07/11/2016  . Brain metastasis ([place]) 07/06/2016  . Seizures ([place]) 06/28/2016  . Pulmonary nodules 05/25/2016  . Hypomagnesemia 02/26/2016  . Hypocalcemia 02/26/2016  . Elevated bilirubin 02/26/2016  . Right shoulder pain 02/24/2016  . Hypokalemia 10/07/2015  . Goals of care, counseling/discussion 09/16/2015  . Malignant neoplasm of right breast in female, estrogen receptor positive ([place]) 05/13/2015  . Sepsis ([place]) 12/23/2014  . Metastasis to bone ([place]) 09/01/2014  . Metastatic breast cancer ([place]) 06/16/2013    Palliative Care Assessment & Plan   Patient Profile: 66 y.o. 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 Followed by Dr. Mike Gip and currently receiving radiation. CT brain shows some improvement in cerebral edema. CXR reveals bilateral pleural effusions with stable RUL pulmonary nodule. Troponin elevated as residual of recent STEMI. Cardiology following. Receiving aspirin and plavix. Hypotension-beta blocker and ACE inhibitor discontinued. Palliative medicine consultation for goals of care.   Assessment: Breast cancer with metastasis to bone and brain Hypotension C. 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 Otherwise, continue full scope treatment.   Patient/family hopeful for improvement with antibiotics. Interested in further radiation if infection clears.   PT pending.   PMT will continue to support patient/family through hospitalization.    Code Status: DNR/DNI   Code Status Orders        Start     Ordered   07/25/16 1019  Do not attempt resuscitation (DNR)  Continuous    Question Answer Comment  In the event of cardiac or respiratory ARREST Do not call a "code blue"   In the event of cardiac or respiratory ARREST Do not perform Intubation, CPR, defibrillation or ACLS   In the event of cardiac or respiratory ARREST Use medication by any route, position, wound care, and other measures to relive pain and suffering. May use oxygen, suction and manual treatment of airway obstruction as needed for comfort.      07/25/16 1018    Code Status History    Date Active Date Inactive Code Status Order ID Comments User Context   07/08/2016 10:48 PM 07/25/2016 10:18 AM Full Code [id number]  Bettey Costa, MD Inpatient   07/13/2016  4:35 PM 07/14/2016  5:04 PM DNR [id number]  Basilio Cairo, NP Inpatient   07/11/2016  4:38 PM 07/13/2016  4:35 PM Full Code [id number]  Gladstone Lighter, MD Inpatient   07/11/2016  4:33 PM 07/11/2016  4:37 PM Full Code [id number]  Yolonda Kida, MD Inpatient   06/28/2016  8:43 PM 06/29/2016  5:14 PM DNR [id number]  Vaughan Basta, MD Inpatient   06/28/2016  6:24 PM 06/28/2016  8:43 PM DNR [id number]  Vaughan Basta, MD ED   12/23/2014  3:41 AM 12/24/2014  2:31 PM Full Code [id number]  Harrie Foreman, MD Inpatient    Advance Directive Documentation     Most Recent Value  Type of Advance Directive  Healthcare Power of Attorney  Pre-existing out of facility DNR order (yellow form or pink MOST form)  -  "MOST" Form in Place?  -       Prognosis:   Unable to  determine: guarded with metastatic breast cancer, recent STEMI, hypotension, positive c.diff, and decrease in functional and nutritional status  Discharge Planning:  To Be Determined  Care plan was discussed with patient, daughter, sister,   Thank you for allowing the Palliative Medicine Team to assist in the care of this patient.   Time In: 0950 Time Out: 1015 Total Time 29min Prolonged Time Billed  no      Greater than 50%  of this time was spent counseling and coordinating care related to the above assessment and plan.  Ihor Dow, FNP-C Palliative Medicine Team  Phone: [phone] Fax: [phone]  Please contact Palliative Medicine Team phone at [phone] for questions and concerns.

## 2016-07-26 NOTE — Progress Notes (Signed)
[facility] [place] NAME: Elizabeth Bond    MR#:  [id number]  DATE OF BIRTH:  09-18-1950  SUBJECTIVE:  CHIEF COMPLAINT:   Chief Complaint  Patient presents with  . 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 On radiation therapy.   ST elevation MI 10 days ago, status post stent.   For last few days have progressive weakness, so brought to emergency room.   CT of the head shows decrease in size of metastasis but still have some surrounding edema.   Chest x-ray was without any acute findings.   She had loose mucousy and bloody stool - CDiff is positive.   Have worsening renal func and thrombocytopenia.  REVIEW OF SYSTEMS:  CONSTITUTIONAL: No fever,positive for fatigue or weakness.  EYES: No blurred or double vision.  EARS, NOSE, AND THROAT: No tinnitus or ear pain.  RESPIRATORY: No cough, shortness of breath, wheezing or hemoptysis.  CARDIOVASCULAR: No chest pain, orthopnea, edema.  GASTROINTESTINAL: No nausea, vomiting,positive for diarrhea and abdominal pain.  GENITOURINARY: No dysuria, hematuria.  ENDOCRINE: No polyuria, nocturia,  HEMATOLOGY: No anemia, easy bruising or bleeding SKIN: No rash or lesion. MUSCULOSKELETAL: No joint pain or arthritis.   NEUROLOGIC: No tingling, numbness, weakness.  PSYCHIATRY: No anxiety or depression.   ROS  DRUG ALLERGIES:   Allergies  Allergen Reactions  . Fentanyl Palpitations and Other (See Comments)    "loopy and confused" "feels like heart is going to come out of my chest"    VITALS:  Blood pressure 122/75, pulse 83, temperature (!) 97.5 F (36.4 C), temperature source Oral, resp. rate 16, height 5\' 4"  (1.626 m), weight 65.3 kg (143 lb 14.4 oz), SpO2 92 %.  PHYSICAL EXAMINATION:  GENERAL:  66 y.o.-year-old patient lying in the bed with no acute distress.  EYES: Pupils equal, round, reactive to light and accommodation. No scleral icterus. Extraocular muscles  intact.  HEENT: Head atraumatic, normocephalic. Oropharynx and nasopharynx clear.  NECK:  Supple, no jugular venous distention. No thyroid enlargement, no tenderness.  LUNGS: Normal breath sounds bilaterally, no wheezing, rales,rhonchi or crepitation. No use of accessory muscles of respiration.  CARDIOVASCULAR: S1, S2 normal. No murmurs, rubs, or gallops.  ABDOMEN: Soft, tender, nondistended. Bowel sounds present. No organomegaly or mass.  EXTREMITIES: No pedal edema, cyanosis, or clubbing.  NEUROLOGIC: Cranial nerves II through XII are intact. Muscle strength 3-4/5 in all extremities. Sensation intact. Gait not checked.  PSYCHIATRIC: The patient is alert and oriented x 3.  SKIN: No obvious rash, lesion, or ulcer.   Physical Exam LABORATORY PANEL:   CBC  Recent Labs Lab 07/26/16 0527  WBC 5.5  HGB 15.9  HCT 46.7  PLT 40*   ------------------------------------------------------------------------------------------------------------------  Chemistries   Recent Labs Lab 07/25/16 0409 07/26/16 0527  NA 136 137  K 3.4* 4.2  CL 105 107  CO2 23 23  GLUCOSE 76 84  BUN 32* 53*  CREATININE 0.66 1.48*  CALCIUM 7.5* 7.5*  MG 1.9  --   AST  --  266*  ALT  --  323*  ALKPHOS  --  140*  BILITOT  --  2.5*   ------------------------------------------------------------------------------------------------------------------  Cardiac Enzymes  Recent Labs Lab 07/19/2016 1640  TROPONINI 2.99*   ------------------------------------------------------------------------------------------------------------------  RADIOLOGY:  No results found.  ASSESSMENT AND PLAN:   Active Problems:   Generalized weakness   Pressure injury of skin   Hypotension   DNR (do not resuscitate)  Clostridium difficile infection   Thrombocytopenia (HCC)   Transaminitis  * C. difficile colitis   C. difficile PCR is positive.   Patient also have shiga toxin producing Escherichia coli.   I spoke on  phone to infectious disease specialist at [facility], who was covering in absence of our permanent infectious disease doctor.   Due to high risk of developing HUS if treated, suggested no antibiotic to give for Escherichia coli.   At this time treat C. difficile with oral vancomycin 125 mg every 6 hours.   I also discussed the case with GI physician, he suggested follow with antibiotics as she is not a candidate for any invasive workup due to acute colitis.  * Hypotension   Most likely secondary to infection, continue IV fluids and hold hypertensive medications.  * Thrombocytopenia likely secondary to her infection.   Gradual drop in her platelet count for last 2 weeks.   She need to stay on aspirin and Plavix because of recent cardiac catheterization and stenting.   Currently hemoglobin is stable in spite of her having some bleeding due to colitis.   I discussed with cardiologist and he suggested for now to continue aspirin and Plavix both, if more concerned tomorrow then we may stop her aspirin.   Appreciated hematology consult, ordered HIT work ups   Peripheral smear is reviewed by pathologist , no abnormalities or clumps except low platelets.  * Elevated troponin   Recent ST elevation MI- CAD   Cardiologist feel this is not a new event but her troponin coming down from her acute event 2 weeks ago.   Advise no further interventions but to continue her cardiac medications.   Beta blockers were held because of hypotension.   Continue aspirin, Plavix, atorvastatin.  * ac renal failure   Due to diarrhea and infection- ATN   Cont IV fluids.   No TTP or HUS as per pathologist.   Appreciated nephrologist consult.  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   Have some surrounding brain edema but decrease in the size of her metastases.   Palliative care consult is called in because of her multiple complicating factors.  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 Continue Decadron.   All the records are reviewed and case discussed with Care Management/Social Workerr. Management plans discussed with the patient, family and they are in agreement.  CODE STATUS: DNR  TOTAL TIME TAKING CARE OF THIS PATIENT: 40 minutes.   Case discussed with the patient, family, palliative care nurse, infectious disease specialist, GI specialist, cardiologist . Prognosis is poor because of presence of multiple complicating factors.  I also discussed about low platelets and renal func , and result of peripehral smear and treatment plan today with her daughter in room.  POSSIBLE D/C IN 2-3 DAYS, DEPENDING ON CLINICAL CONDITION.   Vaughan Basta M.D on 07/26/2016   Between 7am to 6pm - Pager - [phone]  After 6pm go to [email or web address] - password [email or web address] [facility]  Office  [phone]  CC: Primary care physician; Patient, No Pcp Per  Note: This dictation was prepared with Dragon dictation along with smaller phrase technology. Any transcriptional errors that result from this process are unintentional.

## 2016-07-26 NOTE — Consult Note (Signed)
[facility]  CONSULT NOTE    Date: 07/26/2016                  Patient Name:  Elizabeth Bond  MRN: [id number]  DOB: Mar 27, 1950  Age / Sex: 66 y.o., female         PCP: Patient, No Pcp Per                 Service Requesting Consult: Dr. Anselm Jungling                 Reason for Consult: Acute renal failure             History of Present Illness: Ms. Elizabeth Bond is a 66 y.o. white female with breast cancer with metastasis, IBS, depression, GERD, arthritis, hyperlipidemia who was admitted to [facility] on 07/23/2016 for Thrombocytopenia ([place]) [D69.6] Transaminitis [R74.0] Failure to thrive in adult [R62.7] Generalized weakness [R53.1]   Consulted for rise in creatinine. Concern with thrombocytopenia.   C. Diff positive and E. Coli with shiga like toxin in stool.    Medications: Outpatient medications: Prescriptions Prior to Admission  Medication Sig Dispense Refill Last Dose  . acetaminophen (TYLENOL) 325 MG tablet Take 650 mg by mouth daily as needed for headache.   prn at prn  . aspirin 81 MG chewable tablet Chew 1 tablet (81 mg total) by mouth daily. 30 tablet 0 unknown at unknown  . atorvastatin (LIPITOR) 80 MG tablet Take 1 tablet (80 mg total) by mouth daily at 6 PM. 30 tablet 0 unknown at unknown  . Calcium Carb-Cholecalciferol (CALCIUM-VITAMIN D3) 600-400 MG-UNIT TABS Take 1 tablet by mouth 2 (two) times daily.   unknown at unknown  . clonazePAM (KLONOPIN) 0.5 MG tablet TAKE 1 TABLET 3 TIMES DAILY AS NEEDED FOR ANXIETY 90 tablet 1 unknown at unknown  . clopidogrel (PLAVIX) 75 MG tablet Take 1 tablet (75 mg total) by mouth daily with breakfast. 30 tablet 0 unknown at unknown  . dexamethasone (DECADRON) 4 MG tablet Take 1 tablet (4 mg total) by mouth 2 (two) times daily with a meal. 60 tablet 0 unknown at unknown  . levETIRAcetam (KEPPRA) 500 MG tablet Take 1 tablet (500 mg total) by mouth 2 (two) times daily. 60 tablet 0 unknown at unknown  . lisinopril  (PRINIVIL,ZESTRIL) 5 MG tablet Take 1 tablet (5 mg total) by mouth daily. 30 tablet 0 unknown at unknown  . Magnesium 500 MG TABS Take 500 mg by mouth daily.    unknown at unknown  . metoprolol tartrate (LOPRESSOR) 25 MG tablet Take 0.5 tablets (12.5 mg total) by mouth 2 (two) times daily. 60 tablet 0 unknown at unknown  . pantoprazole (PROTONIX) 40 MG tablet Take 1 tablet (40 mg total) by mouth daily. 30 tablet 6 unknown at unknown  . phosphorus (K PHOS NEUTRAL) 155-852-130 MG tablet Take 1 tablet (250 mg total) by mouth 2 (two) times daily. 60 tablet 2 unknown at unknown  . potassium chloride (K-DUR) 10 MEQ tablet TAKE ONE TABLET BY MOUTH EVERY DAY 30 tablet 2 unknown at unknown  . temazepam (RESTORIL) 15 MG capsule TAKE 1 CAPSULE AT BEDTIME AS NEEDED FOR SLEEP 30 capsule 1 unknown at unknown  . loratadine (CLARITIN) 10 MG tablet Take 10 mg by mouth daily as needed for allergies.    prn at prn    Current medications: Current Facility-Administered Medications  Medication Dose Route Frequency Provider Last Rate Last Dose  . 0.9 %  sodium chloride infusion   Intravenous Continuous Bettey Costa, MD 50 mL/hr at 07/26/16 1524    . acetaminophen (TYLENOL) tablet 650 mg  650 mg Oral Q6H PRN Bettey Costa, MD       Or  . acetaminophen (TYLENOL) suppository 650 mg  650 mg Rectal Q6H PRN Mody, Sital, MD      . acetaminophen (TYLENOL) tablet 650 mg  650 mg Oral Daily PRN Mody, Sital, MD      . aspirin chewable tablet 81 mg  81 mg Oral Daily Mody, Sital, MD   81 mg at 07/26/16 0825  . atorvastatin (LIPITOR) tablet 80 mg  80 mg Oral q1800 Bettey Costa, MD   80 mg at 07/25/16 1703  . bisacodyl (DULCOLAX) EC tablet 5 mg  5 mg Oral Daily PRN Bettey Costa, MD      . calcium-vitamin D 500-200 MG-UNIT per tablet 1 tablet  1 tablet Oral BID Bettey Costa, MD   1 tablet at 07/26/16 0825  . clonazePAM (KLONOPIN) tablet 0.25 mg  0.25 mg Oral BID PRN Bettey Costa, MD   0.25 mg at 07/23/2016 2317  . clopidogrel (PLAVIX) tablet  75 mg  75 mg Oral Q breakfast Bettey Costa, MD   75 mg at 07/26/16 0825  . dexamethasone (DECADRON) tablet 4 mg  4 mg Oral BID WC Bettey Costa, MD   4 mg at 07/26/16 0825  . HYDROcodone-acetaminophen (NORCO/VICODIN) 5-325 MG per tablet 1-2 tablet  1-2 tablet Oral Q4H PRN Bettey Costa, MD   1 tablet at 07/25/16 1503  . levETIRAcetam (KEPPRA) tablet 500 mg  500 mg Oral BID Bettey Costa, MD   500 mg at 07/26/16 0825  . loratadine (CLARITIN) tablet 10 mg  10 mg Oral Daily PRN Bettey Costa, MD      . magnesium oxide (MAG-OX) tablet 400 mg  400 mg Oral Daily Benjie Karvonen, Sital, MD   400 mg at 07/26/16 0825  . ondansetron (ZOFRAN) tablet 4 mg  4 mg Oral Q6H PRN Mody, Sital, MD       Or  . ondansetron (ZOFRAN) injection 4 mg  4 mg Intravenous Q6H PRN Mody, Sital, MD      . pantoprazole (PROTONIX) EC tablet 40 mg  40 mg Oral Daily Bettey Costa, MD   40 mg at 07/26/16 0825  . senna-docusate (Senokot-S) tablet 1 tablet  1 tablet Oral QHS PRN Bettey Costa, MD      . vancomycin (VANCOCIN) 50 mg/mL oral solution 125 mg  125 mg Oral Q6H Vaughan Basta, MD   125 mg at 07/26/16 1254      Allergies: Allergies  Allergen Reactions  . Fentanyl Palpitations and Other (See Comments)    "loopy and confused" "feels like heart is going to come out of my chest"      Past Medical History: Past Medical History:  Diagnosis Date  . Arthritis   . Brain metastasis ([place]) 07/06/2016  . Breast cancer ([place])    right  . Cancer ([place]) 2015   breast and bone  . COPD (chronic obstructive pulmonary disease) ([place])   . Depression   . GERD (gastroesophageal reflux disease)   . IBS (irritable bowel syndrome)   . 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 ABDOMINAL HYSTERECTOMY  1978  . CATARACT EXTRACTION W/PHACO Right 06/06/2016   Procedure: CATARACT EXTRACTION PHACO AND INTRAOCULAR LENS PLACEMENT (IOC);  Surgeon: Birder Robson, MD;  Location: [facility]  ORS;  Service: Ophthalmology;  Laterality: Right;  [place] 00:51.1 AP% 14.0 CDE 7.16 Fluid pack lot # [id number]  . CATARACT EXTRACTION W/PHACO Left 06/27/2016   Procedure: CATARACT EXTRACTION PHACO AND INTRAOCULAR LENS PLACEMENT (IOC);  Surgeon: Birder Robson, MD;  Location: [facility] ORS;  Service: Ophthalmology;  Laterality: Left;  [place] 00:37 AP% 19.1 CDE  7.11 Fluid pack lot # [id number]  . CORONARY/GRAFT ACUTE MI REVASCULARIZATION N/A 07/11/2016   Procedure: Coronary/Graft Acute MI Revascularization;  Surgeon: Yolonda Kida, MD;  Location: [place] CV LAB;  Service: Cardiovascular;  Laterality: N/A;  . FLEXIBLE BRONCHOSCOPY N/A 05/31/2016   Procedure: FLEXIBLE BRONCHOSCOPY;  Surgeon: Wilhelmina Mcardle, MD;  Location: [facility] ORS;  Service: Pulmonary;  Laterality: N/A;  . JOINT REPLACEMENT    . LEFT HEART CATH AND CORONARY ANGIOGRAPHY N/A 07/11/2016   Procedure: Left Heart Cath and Coronary Angiography;  Surgeon: Yolonda Kida, MD;  Location: [place] CV LAB;  Service: Cardiovascular;  Laterality: N/A;  . PORTACATH PLACEMENT  2015  . REPLACEMENT TOTAL KNEE Left 2004-2005?     Family History: Family History  Problem Relation Age of Onset  . Cancer Father        prostate  . Cancer Mother        Pancreatic  . Cancer Maternal Aunt        breast  . Cancer Cousin        breast  . Cancer Other        lung cancer - neice  . Cancer - Other Daughter        brain     Social History: Social History   Social History  . Marital status: Widowed    Spouse name: N/A  . Number of children: N/A  . Years of education: N/A   Occupational History  . Not on file.   Social History Main Topics  . Smoking status: Current Every Day Smoker    Packs/day: 0.25    Years: 40.00    Types: Cigarettes  . Smokeless tobacco: Never Used  . Alcohol use No  . Drug use: No  . Sexual activity: Not on file   Other Topics Concern  . Not on file   Social History Narrative  . No narrative on  file     Review of Systems: Review of Systems  Unable to perform ROS: Mental acuity    Vital Signs: Blood pressure 117/64, pulse 80, temperature 98.2 F (36.8 C), temperature source Oral, resp. rate 16, height _0  (1.626 m), weight 65.3 kg (143 lb 14.4 oz), SpO2 99 %.  Weight trends: Filed Weights   07/21/2016 1611 07/25/16 0526 07/26/16 0544  Weight: 58.5 kg (129 lb) 62.1 kg (137 lb) 65.3 kg (143 lb 14.4 oz)    Physical Exam: General: NAD,   Head: Normocephalic, atraumatic. Moist oral mucosal membranes  Eyes: Anicteric, PERRL  Neck: Supple, trachea midline  Lungs:  Clear to auscultation  Heart: Regular rate and rhythm  Abdomen:  Soft, nontender,   Extremities:  no peripheral edema.  Neurologic: Nonfocal, moving all four extremities  Skin: No lesions        Lab results: Basic Metabolic Panel:  Recent Labs Lab 07/20/16 1152 07/21/2016 1640 07/25/16 0409 07/26/16 0527  NA 138 134* 136 137  K 3.7 3.8 3.4* 4.2  CL 106 101 105 107  CO2 _1 GLUCOSE 94 87 76 84  BUN 34* 33* 32* 53*  CREATININE 0.77 0.75 0.66 1.48*  CALCIUM 8.0* 8.1* 7.5* 7.5*  MG 2.0  --  1.9  --     Liver Function Tests:  Recent Labs Lab 07/20/16 1152 07/12/2016 1640 07/26/16 0527  AST 115* 291* 266*  ALT 148* 335* 323*  ALKPHOS 157* 136* 140*  BILITOT 1.4* 1.7* 2.5*  PROT 5.7* 4.9* 4.5*  ALBUMIN 2.5* 2.1* 1.9*   No results for input(s): LIPASE, AMYLASE in the last 168 hours. No results for input(s): AMMONIA in the last 168 hours.  CBC:  Recent Labs Lab 07/20/16 1152 07/19/2016 1640 07/25/16 0409 07/26/16 0527  WBC 6.2 5.8 5.1 5.5  NEUTROABS 5.5 4.5  --   --   HGB 13.4 13.7 15.0 15.9  HCT 39.2 40.3 43.4 46.7  MCV 98.0 96.7 98.5 96.9  PLT 90* 56* 46* 40*    Cardiac Enzymes:  Recent Labs Lab 07/18/2016 1640  TROPONINI 2.99*    BNP: Invalid input(s): POCBNP  CBG: No results for input(s): GLUCAP in the last 168 hours.  Microbiology: Results for orders placed  or performed during the hospital encounter of 07/08/2016  Urine Culture     Status: Abnormal   Collection Time: 07/23/2016  6:45 PM  Result Value Ref Range Status   Specimen Description URINE, RANDOM  Final   Special Requests NONE  Final   Culture MULTIPLE SPECIES PRESENT, SUGGEST RECOLLECTION (A)  Final   Report Status 07/26/2016 FINAL  Final  Gastrointestinal Panel by PCR , Stool     Status: Abnormal   Collection Time: 07/25/16  9:10 AM  Result Value Ref Range Status   Campylobacter species NOT DETECTED NOT DETECTED Final   Plesimonas shigelloides NOT DETECTED NOT DETECTED Final   Salmonella species NOT DETECTED NOT DETECTED Final   Yersinia enterocolitica NOT DETECTED NOT DETECTED Final   Vibrio species NOT DETECTED NOT DETECTED Final   Vibrio cholerae NOT DETECTED NOT DETECTED Final   Enteroaggregative E coli (EAEC) NOT DETECTED NOT DETECTED Final   Enterotoxigenic E coli (ETEC) NOT DETECTED NOT DETECTED Final   Shiga like toxin producing E coli (STEC) DETECTED (A) NOT DETECTED Final    Comment: CRITICAL RESULT CALLED TO, READ BACK BY AND VERIFIED WITH: SERENITY KIRKENDAHL ON 07/25/16 AT 1124 QSD    E. coli O157 NOT DETECTED NOT DETECTED Final   Shigella/Enteroinvasive E coli (EIEC) NOT DETECTED NOT DETECTED Final   Cryptosporidium NOT DETECTED NOT DETECTED Final   Cyclospora cayetanensis NOT DETECTED NOT DETECTED Final   Entamoeba histolytica NOT DETECTED NOT DETECTED Final   Giardia lamblia NOT DETECTED NOT DETECTED Final   Adenovirus F40/41 NOT DETECTED NOT DETECTED Final   Astrovirus NOT DETECTED NOT DETECTED Final   Norovirus GI/GII NOT DETECTED NOT DETECTED Final   Rotavirus A NOT DETECTED NOT DETECTED Final   Sapovirus (I, II, IV, and V) NOT DETECTED NOT DETECTED Final  C difficile quick scan w PCR reflex     Status: Abnormal   Collection Time: 07/25/16  9:10 AM  Result Value Ref Range Status   C Diff antigen POSITIVE (A) NEGATIVE Final   C Diff toxin NEGATIVE NEGATIVE  Final   C Diff interpretation Results are indeterminate. See PCR results.  Final  Clostridium Difficile by PCR     Status: Abnormal   Collection Time: 07/25/16  9:10 AM  Result Value Ref Range Status   Toxigenic C Difficile by pcr POSITIVE (A) NEGATIVE Final    Comment: Positive for toxigenic C. difficile with little to no toxin production. Only treat if clinical  presentation suggests symptomatic illness.    Coagulation Studies: No results for input(s): LABPROT, INR in the last 72 hours.  Urinalysis:  Recent Labs  07/19/2016 1840  COLORURINE AMBER*  LABSPEC 1.023  PHURINE 5.0  GLUCOSEU NEGATIVE  HGBUR SMALL*  BILIRUBINUR NEGATIVE  KETONESUR NEGATIVE  PROTEINUR 30*  NITRITE NEGATIVE  LEUKOCYTESUR NEGATIVE      Imaging: Dg Chest 2 View  Result Date: 08/01/2016 CLINICAL DATA:  Breast cancer weakness and fatigue EXAM: CHEST  2 VIEW COMPARISON:  07/11/2016, 12/20/ 2016 FINDINGS: Left-sided central venous port with tip overlying the cavoatrial junction. Small pleural effusions. No focal consolidation. Mild cardiomegaly with aortic atherosclerosis. Stable 8-9 mm right upper lobe pulmonary nodule. Stable moderate severe compression of mid to lower thoracic vertebra. IMPRESSION: 1. Small bilateral pleural effusions 2. Stable mild cardiomegaly 3. Stable right upper lobe pulmonary nodule Electronically Signed   By: Donavan Foil M.D.   On: 07/03/2016 18:37   Ct Head Wo Contrast  Result Date: 07/09/2016 CLINICAL DATA:  66 year old female with metastatic breast cancer post treatment with progressive weakness. Subsequent encounter. EXAM: CT HEAD WITHOUT CONTRAST TECHNIQUE: Contiguous axial images were obtained from the base of the skull through the vertex without intravenous contrast. COMPARISON:  07/11/2016 06/28/2016 head CT.  06/28/2016 brain MR. FINDINGS: Brain: Further decrease in size of intracranial metastatic lesions and surrounding vasogenic edema. Vasogenic edema remains in portions of  the right temporal lobe, left frontal lobe and left parietal lobe. Right caudate head lesion decrease in size with decrease slight flattening of the right frontal horn. No new intracranial abnormality, hemorrhage or CT evidence of large acute infarct noted. Vascular: Vascular calcifications. Skull: No new focal calvarial abnormality. Sinuses/Orbits: No acute orbital abnormality. Minimal mucosal thickening left maxillary sinus and right sphenoid sinus. Other: Opacification right mastoid air cells with breakthrough right lateral wall similar prior exam. Partial opacification right middle ear. [place] incompletely assessed and possibly partially eroded. Retraction right tympanic membrane. IMPRESSION: Further decrease in size of intracranial metastatic lesions and surrounding vasogenic edema. Vasogenic edema remains in portions of the right temporal lobe, left frontal lobe and left parietal lobe. Right caudate head lesion decrease in size with decrease slight flattening of the right frontal horn. No new intracranial abnormality, hemorrhage or CT evidence of large acute infarct noted. Opacification right mastoid air cells with breakthrough right lateral wall similar prior exam. Partial opacification right middle ear. [place] incompletely assessed and possibly partially eroded. Electronically Signed   By: Genia Del M.D.   On: 07/09/2016 18:35      Assessment & Plan: Ms. JAALIYAH LUCATERO is a 66 y.o. white female with breast cancer with metastasis, IBS, depression, GERD, arthritis, hyperlipidemia who was admitted to [facility] on 07/09/2016 for Thrombocytopenia ([place]) [D69.6] Transaminitis [R74.0] Failure to thrive in adult [R62.7] Generalized weakness [R53.1]   1. Acute renal failure: 2. Thrombocytopenia: 3. Hematuria 4. Proteinuria  Plan Differential does include HUS/TTP however oncology did not report schistocytes.  Already getting systemic steroids.  Continue IV fluids If patient does have HUS/TTP.  Patient will need plasmapheresis which is not done at [facility]     LOS: [place], [place] 7/25/20185:34 PM

## 2016-07-26 NOTE — Evaluation (Signed)
Physical Therapy Evaluation Patient Details Name: Elizabeth Bond MRN: [id number] DOB: [birth year] Today's Date: 07/26/2016   History of Present Illness  Pt is admitted for complaints of weakness and pressure ulcers. Pt with history of STEMI with recent admission on 7/13. Other history includes breast cancer with bone/brain metastases, seizures, COPD, and depression. Pt with downtrending troponin, attributed to previous STEMI.  Clinical Impression  Pt is a pleasant 66 year old female who was admitted for weakness and pressure ulcers. Pt performs bed mobility with mod assist + 2, transfers with mod assist +2, and ambulation with min assist +2 and HHA. First attempt to stand with RW, however pt has difficulty with using, performs better using HHA. Pt demonstrates deficits with weakness/balance/endurance. Pt with incontinent episode, needed assist for hygiene. Would benefit from skilled PT to address above deficits and promote optimal return to PLOF. Recommend transition to STR upon discharge from acute hospitalization.       Follow Up Recommendations SNF    Equipment Recommendations  None recommended by PT    Recommendations for Other Services       Precautions / Restrictions Precautions Precautions: Fall Restrictions Weight Bearing Restrictions: No      Mobility  Bed Mobility Overal bed mobility: Needs Assistance Bed Mobility: Supine to Sit     Supine to sit: Mod assist     General bed mobility comments: needs assist for sliding B LEs off bed. Once seated at EOB, able to sit with upright posture.  Transfers Overall transfer level: Needs assistance Equipment used: Rolling walker (2 wheeled) Transfers: Sit to/from Stand Sit to Stand: Mod assist;+2 physical assistance         General transfer comment: 2 attempts for standing, first attempt with RW followed by HHA with +2 assist. Once standing, pt able to stand with upright posture  Ambulation/Gait Ambulation/Gait  assistance: Min assist;+2 physical assistance Ambulation Distance (Feet): 5 Feet Assistive device: 2 person hand held assist Gait Pattern/deviations: Step-to pattern     General Gait Details: Pt able to take steps over to recliner with +2 assist with heavy cues for sequencing. Pt fatigues quickly and is unable to further ambulate  Stairs            Wheelchair Mobility    Modified Rankin (Stroke Patients Only)       Balance Overall balance assessment: Needs assistance Sitting-balance support: Bilateral upper extremity supported Sitting balance-Leahy Scale: Good     Standing balance support: Bilateral upper extremity supported Standing balance-Leahy Scale: Fair                               Pertinent Vitals/Pain Pain Assessment: Faces Faces Pain Scale: Hurts a little bit Pain Location: buttocks Pain Descriptors / Indicators: Aching Pain Intervention(s): Limited activity within patient's tolerance    Home Living Family/patient expects to be discharged to:: Private residence Living Arrangements: Other relatives (lives with daughter who is available for 24/7) Available Help at Discharge: Family;Available 24 hours/day Type of Home: House Home Access: Stairs to enter Entrance Stairs-Rails: Left Entrance Stairs-Number of Steps: 7 Home Layout: One level Home Equipment: None      Prior Function Level of Independence: Independent with assistive device(s)         Comments: reports no falls, however has had a functional decline recently with limiting ambulation distance and uses RW.     Hand Dominance        Extremity/Trunk Assessment  Upper Extremity Assessment Upper Extremity Assessment: Generalized weakness (B UE grossly 3+/5)    Lower Extremity Assessment Lower Extremity Assessment: Generalized weakness (B LE grossly 3+/5)       Communication   Communication: No difficulties  Cognition Arousal/Alertness: Awake/alert Behavior During  Therapy: WFL for tasks assessed/performed Overall Cognitive Status: Within Functional Limits for tasks assessed                                        General Comments      Exercises Other Exercises Other Exercises: Pt able to perform supine bed ther-ex including ankle pumps, quad sets, SLRs, hip abd/add and multiple attempts rolling during hygiene from incontinent BM episode. All ther-ex performed x 10 reps with min assist for sequencing.   Assessment/Plan    PT Assessment Patient needs continued PT services  PT Problem List Decreased strength;Decreased balance;Decreased safety awareness;Decreased cognition       PT Treatment Interventions DME instruction;Gait training;Stair training;Functional mobility training;Therapeutic activities;Therapeutic exercise;Balance training;Neuromuscular re-education;Cognitive remediation;Patient/family education    PT Goals (Current goals can be found in the Care Plan section)  Acute Rehab PT Goals Patient Stated Goal: To go home PT Goal Formulation: With patient Time For Goal Achievement: 08/09/16 Potential to Achieve Goals: Fair    Frequency Min 2X/week   Barriers to discharge        Co-evaluation               AM-PAC PT "6 Clicks" Daily Activity  Outcome Measure Difficulty turning over in bed (including adjusting bedclothes, sheets and blankets)?: Total Difficulty moving from lying on back to sitting on the side of the bed? : Total Difficulty sitting down on and standing up from a chair with arms (e.g., wheelchair, bedside commode, etc,.)?: Total Help needed moving to and from a bed to chair (including a wheelchair)?: A Lot Help needed walking in hospital room?: A Lot Help needed climbing 3-5 steps with a railing? : Total 6 Click Score: 8    End of Session Equipment Utilized During Treatment: Gait belt Activity Tolerance: Patient tolerated treatment well Patient left: in chair;with call bell/phone within  reach;with chair alarm set;with family/visitor present Nurse Communication: Mobility status PT Visit Diagnosis: Unsteadiness on feet (R26.81);Muscle weakness (generalized) (M62.81);Difficulty in walking, not elsewhere classified (R26.2)    Time: 9233-0076 PT Time Calculation (min) (ACUTE ONLY): 33 min   Charges:   PT Evaluation $PT Eval Moderate Complexity: 1 Procedure PT Treatments $Therapeutic Exercise: 8-22 mins   PT G Codes:        Greggory Stallion, PT, DPT [phone]   Graham Doukas 07/26/2016, 4:16 PM

## 2016-07-27 ENCOUNTER — Ambulatory Visit: Payer: Medicare Other

## 2016-07-27 LAB — COMPREHENSIVE METABOLIC PANEL
ALBUMIN: 1.9 g/dL — AB (ref 3.5–5.0)
ALK PHOS: 171 U/L — AB (ref 38–126)
ALT: 303 U/L — ABNORMAL HIGH (ref 14–54)
AST: 247 U/L — AB (ref 15–41)
Anion gap: 7 (ref 5–15)
BUN: 63 mg/dL — AB (ref 6–20)
CALCIUM: 7.1 mg/dL — AB (ref 8.9–10.3)
CO2: 20 mmol/L — ABNORMAL LOW (ref 22–32)
Chloride: 107 mmol/L (ref 101–111)
Creatinine, Ser: 2.09 mg/dL — ABNORMAL HIGH (ref 0.44–1.00)
GFR calc Af Amer: 27 mL/min — ABNORMAL LOW (ref >60)
GFR calc non Af Amer: 23 mL/min — ABNORMAL LOW (ref >60)
GLUCOSE: 80 mg/dL (ref 65–99)
POTASSIUM: 4.7 mmol/L (ref 3.5–5.1)
Sodium: 134 mmol/L — ABNORMAL LOW (ref 135–145)
TOTAL PROTEIN: 4.5 g/dL — AB (ref 6.5–8.1)
Total Bilirubin: 2.9 mg/dL — ABNORMAL HIGH (ref 0.3–1.2)

## 2016-07-27 LAB — CBC
HEMATOCRIT: 49.2 % — AB (ref 35.0–47.0)
HEMOGLOBIN: 16.6 g/dL — AB (ref 12.0–16.0)
MCH: 32.8 pg (ref 26.0–34.0)
MCHC: 33.8 g/dL (ref 32.0–36.0)
MCV: 97 fL (ref 80.0–100.0)
Platelets: 47 10*3/uL — ABNORMAL LOW (ref 150–440)
RBC: 5.08 MIL/uL (ref 3.80–5.20)
RDW: 19.1 % — ABNORMAL HIGH (ref 11.5–14.5)
WBC: 5.2 10*3/uL (ref 3.6–11.0)

## 2016-07-27 LAB — HEPARIN INDUCED PLATELET AB (HIT ANTIBODY): Heparin Induced Plt Ab: 0.756 OD — ABNORMAL HIGH (ref 0.000–0.400)

## 2016-07-27 NOTE — Progress Notes (Signed)
[facility]  ROUNDING NOTE   Subjective:   Family at bedside.   Platelets 47   Creatinine 2.09 (1.48)  NS at 136mL/hr  PO vanco  Objective:  Vital signs in last 24 hours:  Temp:  [97.5 F (36.4 C)-97.6 F (36.4 C)] 97.6 F (36.4 C) (07/26 0745) Pulse Rate:  [83-90] 90 (07/26 0745) Resp:  [16-18] 17 (07/26 0745) BP: (122-145)/(75-85) 145/85 (07/26 0745) SpO2:  [92 %-94 %] 93 % (07/26 0745) Weight:  [68.5 kg (151 lb)] 68.5 kg (151 lb) (07/26 0500)  Weight change: 3.221 kg (7 lb 1.6 oz) Filed Weights   07/25/16 0526 07/26/16 0544 07/27/16 0500  Weight: 62.1 kg (137 lb) 65.3 kg (143 lb 14.4 oz) 68.5 kg (151 lb)    Intake/Output: I/O last 3 completed shifts: In: 2686.7 [P.O.:120; I.V.:2566.7] Out: 0    Intake/Output this shift:  Total I/O In: 120 [P.O.:120] Out: -   Physical Exam: General: NAD,   Head: Normocephalic, atraumatic. Moist oral mucosal membranes  Eyes: Anicteric, PERRL  Neck: Supple, trachea midline  Lungs:  Clear to auscultation  Heart: Regular rate and rhythm  Abdomen:  Soft, nontender,   Extremities: no peripheral edema.  Neurologic: Nonfocal, moving all four extremities  Skin: No lesions       Basic Metabolic Panel:  Recent Labs Lab 07/05/2016 1640 07/25/16 0409 07/26/16 0527 07/27/16 0533  NA 134* 136 137 134*  K 3.8 3.4* 4.2 4.7  CL 101 105 107 107  CO2 26 23 23  20*  GLUCOSE 87 76 84 80  BUN 33* 32* 53* 63*  CREATININE 0.75 0.66 1.48* 2.09*  CALCIUM 8.1* 7.5* 7.5* 7.1*  MG  --  1.9  --   --     Liver Function Tests:  Recent Labs Lab 07/27/2016 1640 07/26/16 0527 07/27/16 0533  AST 291* 266* 247*  ALT 335* 323* 303*  ALKPHOS 136* 140* 171*  BILITOT 1.7* 2.5* 2.9*  PROT 4.9* 4.5* 4.5*  ALBUMIN 2.1* 1.9* 1.9*   No results for input(s): LIPASE, AMYLASE in the last 168 hours. No results for input(s): AMMONIA in the last 168 hours.  CBC:  Recent Labs Lab 07/09/2016 1640 07/25/16 0409 07/26/16 0527  07/27/16 0533  WBC 5.8 5.1 5.5 5.2  NEUTROABS 4.5  --   --   --   HGB 13.7 15.0 15.9 16.6*  HCT 40.3 43.4 46.7 49.2*  MCV 96.7 98.5 96.9 97.0  PLT 56* 46* 40* 47*    Cardiac Enzymes:  Recent Labs Lab 07/03/2016 1640  TROPONINI 2.99*    BNP: Invalid input(s): POCBNP  CBG: No results for input(s): GLUCAP in the last 168 hours.  Microbiology: Results for orders placed or performed during the hospital encounter of 07/26/2016  Urine Culture     Status: Abnormal   Collection Time: 07/21/2016  6:45 PM  Result Value Ref Range Status   Specimen Description URINE, RANDOM  Final   Special Requests NONE  Final   Culture MULTIPLE SPECIES PRESENT, SUGGEST RECOLLECTION (A)  Final   Report Status 07/26/2016 FINAL  Final  Gastrointestinal Panel by PCR , Stool     Status: Abnormal   Collection Time: 07/25/16  9:10 AM  Result Value Ref Range Status   Campylobacter species NOT DETECTED NOT DETECTED Final   Plesimonas shigelloides NOT DETECTED NOT DETECTED Final   Salmonella species NOT DETECTED NOT DETECTED Final   Yersinia enterocolitica NOT DETECTED NOT DETECTED Final   Vibrio species NOT DETECTED NOT DETECTED Final  Vibrio cholerae NOT DETECTED NOT DETECTED Final   Enteroaggregative E coli (EAEC) NOT DETECTED NOT DETECTED Final   Enterotoxigenic E coli (ETEC) NOT DETECTED NOT DETECTED Final   Shiga like toxin producing E coli (STEC) DETECTED (A) NOT DETECTED Final    Comment: CRITICAL RESULT CALLED TO, READ BACK BY AND VERIFIED WITH: SERENITY KIRKENDAHL ON 07/25/16 AT 1124 QSD    E. coli O157 NOT DETECTED NOT DETECTED Final   Shigella/Enteroinvasive E coli (EIEC) NOT DETECTED NOT DETECTED Final   Cryptosporidium NOT DETECTED NOT DETECTED Final   Cyclospora cayetanensis NOT DETECTED NOT DETECTED Final   Entamoeba histolytica NOT DETECTED NOT DETECTED Final   Giardia lamblia NOT DETECTED NOT DETECTED Final   Adenovirus F40/41 NOT DETECTED NOT DETECTED Final   Astrovirus NOT DETECTED NOT  DETECTED Final   Norovirus GI/GII NOT DETECTED NOT DETECTED Final   Rotavirus A NOT DETECTED NOT DETECTED Final   Sapovirus (I, II, IV, and V) NOT DETECTED NOT DETECTED Final  C difficile quick scan w PCR reflex     Status: Abnormal   Collection Time: 07/25/16  9:10 AM  Result Value Ref Range Status   C Diff antigen POSITIVE (A) NEGATIVE Final   C Diff toxin NEGATIVE NEGATIVE Final   C Diff interpretation Results are indeterminate. See PCR results.  Final  Clostridium Difficile by PCR     Status: Abnormal   Collection Time: 07/25/16  9:10 AM  Result Value Ref Range Status   Toxigenic C Difficile by pcr POSITIVE (A) NEGATIVE Final    Comment: Positive for toxigenic C. difficile with little to no toxin production. Only treat if clinical presentation suggests symptomatic illness.    Coagulation Studies: No results for input(s): LABPROT, INR in the last 72 hours.  Urinalysis:  Recent Labs  07/11/2016 1840  COLORURINE AMBER*  LABSPEC 1.023  PHURINE 5.0  GLUCOSEU NEGATIVE  HGBUR SMALL*  BILIRUBINUR NEGATIVE  KETONESUR NEGATIVE  PROTEINUR 30*  NITRITE NEGATIVE  LEUKOCYTESUR NEGATIVE      Imaging: No results found.   Medications:   . sodium chloride 125 mL/hr at 07/27/16 0931   . aspirin  81 mg Oral Daily  . atorvastatin  80 mg Oral q1800  . calcium-vitamin D  1 tablet Oral BID  . clopidogrel  75 mg Oral Q breakfast  . dexamethasone  4 mg Oral BID WC  . levETIRAcetam  500 mg Oral BID  . magnesium oxide  400 mg Oral Daily  . pantoprazole  40 mg Oral Daily  . vancomycin  125 mg Oral Q6H   acetaminophen **OR** acetaminophen, acetaminophen, clonazePAM, HYDROcodone-acetaminophen, loratadine, ondansetron **OR** ondansetron (ZOFRAN) IV  Assessment/ Plan:  Ms. Elizabeth Bond is a 66 y.o.  female Ms. Elizabeth Bond is a 66 y.o. white female with breast cancer with metastasis, IBS, depression, GERD, arthritis, hyperlipidemia who was admitted to [facility] on 07/16/2016    1. Acute renal  failure with hematuria and proteinuria: history consistent with acute prerenal azotemia. No signs of schistocytes and cleared of HUS/TTP - systemic steroids - IV fluids - no indication for dialysis.   2. Thrombocytopenia: trough of 40, improved today to 47    LOS: 3 Gari Trovato 7/26/201812:01 PM

## 2016-07-27 NOTE — Plan of Care (Signed)
Problem: Pain Managment: Goal: General experience of comfort will improve Outcome: Progressing No complaints of pain this shift, will continue to monitor  Problem: Tissue Perfusion: Goal: Risk factors for ineffective tissue perfusion will decrease Outcome: Progressing ASA & plavix

## 2016-07-27 NOTE — Care Management (Signed)
Spoke with patient's daughter about whether patient has  medicare D policy for medications.  Informed that received notification that as of today patient has medicaid.  Daughter is not able to provide any other information.  Attempted to call [employer] to verify and unable to get through to a representative. Patient's local pharmacy is [facility].  CM is trying to anticipate need for oral vancomycin at discharge and whether assistance will be needed to obtain it

## 2016-07-27 NOTE — Progress Notes (Signed)
Physical Therapy Treatment Patient Details Name: Elizabeth Bond MRN: [id number] DOB: [birth year] Today's Date: 07/27/2016    History of Present Illness Pt is admitted for complaints of weakness and pressure ulcers. Pt with history of STEMI with recent admission on 7/13. Other history includes breast cancer with bone/brain metastases, seizures, COPD, and depression. Pt with downtrending troponin, attributed to previous STEMI.    PT Comments    Pt is making gradual progress towards goals, however limited by lethargy this date. Pt with increased weakness compared to previous session, needs more help with there-ex and mobility efforts. Not safe for OOB to chair as pt unable to fully stand with +2 assist. Will continue to assess.  Follow Up Recommendations  SNF     Equipment Recommendations  None recommended by PT    Recommendations for Other Services       Precautions / Restrictions Precautions Precautions: Fall Restrictions Weight Bearing Restrictions: No    Mobility  Bed Mobility Overal bed mobility: Needs Assistance Bed Mobility: Supine to Sit     Supine to sit: Mod assist;+2 for physical assistance     General bed mobility comments: needs assist for sliding B LEs off EOB and sitting at EOB. Once seated, pt able to sit with upright posture. Pt fatigues quickly and needs assist as she has LOB towards R side   Transfers Overall transfer level: Needs assistance Equipment used: Rolling walker (2 wheeled) Transfers: Sit to/from Stand Sit to Stand: Max assist;+2 physical assistance         General transfer comment: multiple attempts for standing with and without AD. Pt struggles to WB on B LE with knees buckling. Attempted SPT, however unable to perform. Further mobility not performed due to extreme weakness.  Ambulation/Gait                 Stairs            Wheelchair Mobility    Modified Rankin (Stroke Patients Only)       Balance                                            Cognition Arousal/Alertness: Lethargic Behavior During Therapy: WFL for tasks assessed/performed Overall Cognitive Status: Within Functional Limits for tasks assessed                                        Exercises Other Exercises Other Exercises: supine bed ther-ex performed including B LE SAQ, SLRs, hip abd, hip add squeezes, and B UE shoulder flexion. All ther-ex performed x 12 reps however fatigues quickly and needs max assist for performance    General Comments        Pertinent Vitals/Pain Pain Assessment: No/denies pain    Home Living                      Prior Function            PT Goals (current goals can now be found in the care plan section) Acute Rehab PT Goals Patient Stated Goal: To go home PT Goal Formulation: With patient Time For Goal Achievement: 08/09/16 Potential to Achieve Goals: Fair Progress towards PT goals: Progressing toward goals    Frequency    Min 2X/week  PT Plan Current plan remains appropriate    Co-evaluation              AM-PAC PT "6 Clicks" Daily Activity  Outcome Measure  Difficulty turning over in bed (including adjusting bedclothes, sheets and blankets)?: Total Difficulty moving from lying on back to sitting on the side of the bed? : Total Difficulty sitting down on and standing up from a chair with arms (e.g., wheelchair, bedside commode, etc,.)?: Total Help needed moving to and from a bed to chair (including a wheelchair)?: Total Help needed walking in hospital room?: Total Help needed climbing 3-5 steps with a railing? : Total 6 Click Score: 6    End of Session Equipment Utilized During Treatment: Gait belt Activity Tolerance: Patient limited by lethargy Patient left: in bed;with bed alarm set Nurse Communication: Mobility status PT Visit Diagnosis: Unsteadiness on feet (R26.81);Muscle weakness (generalized) (M62.81);Difficulty in  walking, not elsewhere classified (R26.2)     Time: 6811-5726 PT Time Calculation (min) (ACUTE ONLY): 25 min  Charges:  $Therapeutic Exercise: 8-22 mins $Therapeutic Activity: 8-22 mins                    G Codes:       Greggory Stallion, PT, DPT [phone]    [name] 07/27/2016, 5:01 PM

## 2016-07-27 NOTE — Progress Notes (Signed)
[facility] [place] NAME: Elizabeth Bond    MR#:  [id number]  DATE OF BIRTH:  June 04, 1950  SUBJECTIVE:  CHIEF COMPLAINT:   Chief Complaint  Patient presents with  . 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 On radiation therapy.   ST elevation MI 10 days ago, status post stent.   For last few days have progressive weakness, so brought to emergency room.   CT of the head shows decrease in size of metastasis but still have some surrounding edema.   Chest x-ray was without any acute findings.   She had loose mucousy and bloody stool - CDiff is positive.   Have worsening renal func and thrombocytopenia improved some today.   Diarrhea some better, no blood now.  REVIEW OF SYSTEMS:  CONSTITUTIONAL: No fever,positive for fatigue or weakness.  EYES: No blurred or double vision.  EARS, NOSE, AND THROAT: No tinnitus or ear pain.  RESPIRATORY: No cough, shortness of breath, wheezing or hemoptysis.  CARDIOVASCULAR: No chest pain, orthopnea, edema.  GASTROINTESTINAL: No nausea, vomiting,positive for diarrhea and abdominal pain.  GENITOURINARY: No dysuria, hematuria.  ENDOCRINE: No polyuria, nocturia,  HEMATOLOGY: No anemia, easy bruising or bleeding SKIN: No rash or lesion. MUSCULOSKELETAL: No joint pain or arthritis.   NEUROLOGIC: No tingling, numbness, weakness.  PSYCHIATRY: No anxiety or depression.   ROS  DRUG ALLERGIES:   Allergies  Allergen Reactions  . Fentanyl Palpitations and Other (See Comments)    "loopy and confused" "feels like heart is going to come out of my chest"    VITALS:  Blood pressure 133/83, pulse 89, temperature (!) 97.5 F (36.4 C), temperature source Oral, resp. rate 18, height 5\' 4"  (1.626 m), weight 68.5 kg (151 lb), SpO2 91 %.  PHYSICAL EXAMINATION:  GENERAL:  66 y.o.-year-old patient lying in the bed with no acute distress.  EYES: Pupils equal, round, reactive to light and  accommodation. No scleral icterus. Extraocular muscles intact.  HEENT: Head atraumatic, normocephalic. Oropharynx and nasopharynx clear.  NECK:  Supple, no jugular venous distention. No thyroid enlargement, no tenderness.  LUNGS: Normal breath sounds bilaterally, no wheezing, rales,rhonchi or crepitation. No use of accessory muscles of respiration.  CARDIOVASCULAR: S1, S2 normal. No murmurs, rubs, or gallops.  ABDOMEN: Soft, tender, nondistended. Bowel sounds present. No organomegaly or mass.  EXTREMITIES: No pedal edema, cyanosis, or clubbing.  NEUROLOGIC: Cranial nerves II through XII are intact. Muscle strength 3-4/5 in all extremities. Sensation intact. Gait not checked.  PSYCHIATRIC: The patient is alert and oriented x 3.  SKIN: No obvious rash, lesion, or ulcer.   Physical Exam LABORATORY PANEL:   CBC  Recent Labs Lab 07/27/16 0533  WBC 5.2  HGB 16.6*  HCT 49.2*  PLT 47*   ------------------------------------------------------------------------------------------------------------------  Chemistries   Recent Labs Lab 07/25/16 0409  07/27/16 0533  NA 136  < > 134*  K 3.4*  < > 4.7  CL 105  < > 107  CO2 23  < > 20*  GLUCOSE 76  < > 80  BUN 32*  < > 63*  CREATININE 0.66  < > 2.09*  CALCIUM 7.5*  < > 7.1*  MG 1.9  --   --   AST  --   < > 247*  ALT  --   < > 303*  ALKPHOS  --   < > 171*  BILITOT  --   < > 2.9*  < > =  values in this interval not displayed. ------------------------------------------------------------------------------------------------------------------  Cardiac Enzymes  Recent Labs Lab 08/01/2016 1640  TROPONINI 2.99*   ------------------------------------------------------------------------------------------------------------------  RADIOLOGY:  No results found.  ASSESSMENT AND PLAN:   Active Problems:   Generalized weakness   Pressure injury of skin   Hypotension   DNR (do not resuscitate)   Clostridium difficile infection    Thrombocytopenia (HCC)   Transaminitis  * C. difficile colitis   C. difficile PCR is positive.   Patient also have shiga toxin producing Escherichia coli.   I spoke on phone to infectious disease specialist at [facility], who was covering in absence of our permanent infectious disease doctor.   Due to high risk of developing HUS if treated, suggested no antibiotic to give for Escherichia coli.   At this time treat C. difficile with oral vancomycin 125 mg every 6 hours.   I also discussed the case with GI physician, he suggested follow with antibiotics as she is not a candidate for any invasive workup due to acute colitis.   Symptomatically improving.  * Hypotension   Most likely secondary to infection, continue IV fluids and hold hypertensive medications.  * Thrombocytopenia likely secondary to her infection.   Gradual drop in her platelet count for last 2 weeks.   She need to stay on aspirin and Plavix because of recent cardiac catheterization and stenting.   Currently hemoglobin is stable in spite of her having some bleeding due to colitis.   I discussed with cardiologist and he suggested for now to continue aspirin and Plavix both, if more concerned tomorrow then we may stop her aspirin.   Appreciated hematology consult, ordered HIT work ups as she had heparin 2 weeks ago.   Peripheral smear is reviewed by pathologist , no abnormalities or clumps except low platelets.    Currently hold any anticoagulants, cont ASA+ plavix.  * Elevated troponin   Recent ST elevation MI- CAD   Cardiologist feel this is not a new event but her troponin coming down from her acute event 2 weeks ago.   Advise no further interventions but to continue her cardiac medications.   Beta blockers were held because of hypotension.   Continue aspirin, Plavix, atorvastatin.  * ac renal failure   Due to diarrhea and infection- ATN   Cont IV fluids.   No TTP or HUS as per pathologist.   Appreciated nephrologist  consult.    Renal func worse today.  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   Have some surrounding brain edema but decrease in the size of her metastases.   Palliative care consult is called in because of her multiple complicating factors.  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 Continue Decadron.   All the records are reviewed and case discussed with Care Management/Social Workerr. Management plans discussed with the patient, family and they are in agreement.  CODE STATUS: DNR  TOTAL TIME TAKING CARE OF THIS PATIENT: 40 minutes.   Case discussed with the patient, family, palliative care nurse, infectious disease specialist, GI specialist, cardiologist . Prognosis is poor because of presence of multiple complicating factors.  I also discussed about low platelets and renal func , and result of peripehral smear and treatment plan today with her daughter in room.  POSSIBLE D/C IN 2-3 DAYS, DEPENDING ON CLINICAL CONDITION.   Vaughan Basta M.D on 07/27/2016   Between 7am to 6pm - Pager - [phone]  After  6pm go to [email or web address] - password [email or web address] [facility]  Office  [phone]  CC: Primary care physician; Patient, No Pcp Per  Note: This dictation was prepared with Dragon dictation along with smaller phrase technology. Any transcriptional errors that result from this process are unintentional.

## 2016-07-27 NOTE — Clinical Social Work Note (Signed)
CSW received referral for SNF.  Case discussed with case manager and plan is to discharge home with home health.  CSW to sign off please re-consult if social work needs arise.  Ansh Fauble R. Junko Ohagan, MSW, LCSWA [phone]  

## 2016-07-27 NOTE — Progress Notes (Signed)
[facility] Hematology/Oncology Progress Note  Date of admission: 07/31/2016  Hospital day:  07/27/2016  Chief Complaint: Elizabeth Bond is a 66 y.o. 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  Subjective: Feels a little better today.  Still weak.  Less diarrhea (no blood in stool).  Minimal oral intake.  Social History: The patient is accompanied by 4 family members today.  Allergies:  Allergies  Allergen Reactions  . Fentanyl Palpitations and Other (See Comments)    "loopy and confused" "feels like heart is going to come out of my chest"    Scheduled Medications: . aspirin  81 mg Oral Daily  . atorvastatin  80 mg Oral q1800  . calcium-vitamin D  1 tablet Oral BID  . clopidogrel  75 mg Oral Q breakfast  . dexamethasone  4 mg Oral BID WC  . levETIRAcetam  500 mg Oral BID  . magnesium oxide  400 mg Oral Daily  . pantoprazole  40 mg Oral Daily  . vancomycin  125 mg Oral Q6H    Review of Systems: GENERAL: Fatigue. No fevers. No weight loss. PERFORMANCE STATUS (ECOG): 3 HEENT: No visual changes, runny nose, sore throat, mouth sores or tenderness. Lungs: No shortness of breath. No cough. No hemoptysis.  Wants nicotine patch. Cardiac: No chest pain, palpitations, orthopnea, or PND.  GI: Diarrhea, improved.  No nausea, vomiting, constipation, melena or hematochezia. GU: Incontinence.  No urgency, frequency, dysuria, or hematuria. Musculoskeletal: No shoulder pain. No back pain. No joint pain. No muscle tenderness. Extremities: Lower extremity swelling. No pain. Skin: No rashes or skin changes. Neuro: General weakness. No seizures. No headache, focal numbness or weakness. Endocrine: No diabetes, thyroid issues, hot flashes or night sweats. Psych: No mood changes, depression or anxiety. Pain: No  focal pain.  Review of systems: All other systems reviewed and found to be negative.  Physical Exam: Blood pressure 133/83, pulse 89, temperature (!) 97.5 F (36.4 C), temperature source Oral, resp. rate 18, height _0  (1.626 m), weight 151 lb (68.5 kg), SpO2 91 %.  GENERAL:Chronically fatigued woman lying comfortably on the medical unit in no acute distress. MENTAL STATUS: Alert and oriented to person, place and time. HEAD:Short gray hair. Normocephalic, atraumatic, face symmetric, no Cushingoid features. EYES:Hazel eyes. Pupils equal round and reactive to light and accomodation. No conjunctivitis or scleral icterus. YTK:ZSWFUXNATF clear without lesion. Tonguenormal. Mucous membranes dry. RESPIRATORY:Clear to auscultationwithout rales, wheezes or rhonchi. CARDIOVASCULAR:Regular rate andrhythmwithout murmur, rub or gallop. No JVD. ABDOMEN:Soft, slightly tender in the mid abdomen without guarding or rebound tenderness.  Active bowel sounds and no hepatosplenomegaly. No masses. SKIN: No rashes, ulcers or lesions. EXTREMITIES: Trace bilateral lower extremity edema to knees. No skin discoloration or tenderness. No palpable cords. NEUROLOGICAL: Appropriate. PSYCH: Appropriate.    Results for orders placed or performed during the hospital encounter of 07/13/2016 (from the past 48 hour(s))  CBC     Status: Abnormal   Collection Time: 07/26/16  5:27 AM  Result Value Ref Range   WBC 5.5 3.6 - 11.0 K/uL   RBC 4.82 3.80 - 5.20 MIL/uL   Hemoglobin 15.9 12.0 - 16.0 g/dL   HCT 46.7 35.0 - 47.0 %   MCV 96.9 80.0 - 100.0 fL   MCH 32.9 26.0 - 34.0 pg   MCHC 34.0 32.0 - 36.0 g/dL   RDW 18.4 (H) 11.5 - 14.5 %  Platelets 40 (L) 150 - 440 K/uL  Comprehensive metabolic panel     Status: Abnormal   Collection Time: 07/26/16  5:27 AM  Result Value Ref Range   Sodium 137 135 - 145 mmol/L   Potassium 4.2 3.5 - 5.1 mmol/L   Chloride 107 101 - 111 mmol/L   CO2 23 22 - 32  mmol/L   Glucose, Bld 84 65 - 99 mg/dL   BUN 53 (H) 6 - 20 mg/dL   Creatinine, Ser 1.48 (H) 0.44 - 1.00 mg/dL   Calcium 7.5 (L) 8.9 - 10.3 mg/dL   Total Protein 4.5 (L) 6.5 - 8.1 g/dL   Albumin 1.9 (L) 3.5 - 5.0 g/dL   AST 266 (H) 15 - 41 U/L   ALT 323 (H) 14 - 54 U/L   Alkaline Phosphatase 140 (H) 38 - 126 U/L   Total Bilirubin 2.5 (H) 0.3 - 1.2 mg/dL   GFR calc non Af Amer 35 (L) >60 mL/min   GFR calc Af Amer 41 (L) >60 mL/min    Comment: (NOTE) The eGFR has been calculated using the CKD EPI equation. This calculation has not been validated in all clinical situations. eGFR's persistently <60 mL/min signify possible Chronic Kidney Disease.    Anion gap 7 5 - 15  Type and screen Combine     Status: None   Collection Time: 07/26/16  5:27 AM  Result Value Ref Range   ABO/RH(D) A POS    Antibody Screen NEG    Sample Expiration 07/29/2016   Heparin induced platelet Ab (HIT antibody)     Status: Abnormal   Collection Time: 07/26/16  5:27 AM  Result Value Ref Range   Heparin Induced Plt Ab 0.756 (H) 0.000 - 0.400 OD    Comment: (NOTE) Performed At: [facility] [place], [place] [id number] Lindon Romp MD SW:[id number]   Pathologist smear review     Status: None   Collection Time: 07/26/16  5:27 AM  Result Value Ref Range   Path Review      Blood smear reviewed. There is severe thrombocytopenia without clumping. RBC morphology is unremarkable and there is no evidence of hemolysis or fragmentation. No abnormal or immature leukocytes are identified.     Comment: Reviewed by Lemmie Evens. Dicie Beam, MD.  CBC     Status: Abnormal   Collection Time: 07/27/16  5:33 AM  Result Value Ref Range   WBC 5.2 3.6 - 11.0 K/uL   RBC 5.08 3.80 - 5.20 MIL/uL   Hemoglobin 16.6 (H) 12.0 - 16.0 g/dL   HCT 49.2 (H) 35.0 - 47.0 %   MCV 97.0 80.0 - 100.0 fL   MCH 32.8 26.0 - 34.0 pg   MCHC 33.8 32.0 - 36.0 g/dL   RDW 19.1 (H) 11.5 - 14.5 %   Platelets 47  (L) 150 - 440 K/uL  Comprehensive metabolic panel     Status: Abnormal   Collection Time: 07/27/16  5:33 AM  Result Value Ref Range   Sodium 134 (L) 135 - 145 mmol/L   Potassium 4.7 3.5 - 5.1 mmol/L   Chloride 107 101 - 111 mmol/L   CO2 20 (L) 22 - 32 mmol/L   Glucose, Bld 80 65 - 99 mg/dL   BUN 63 (H) 6 - 20 mg/dL   Creatinine, Ser 2.09 (H) 0.44 - 1.00 mg/dL   Calcium 7.1 (L) 8.9 - 10.3 mg/dL   Total Protein 4.5 (L) 6.5 - 8.1 g/dL  Albumin 1.9 (L) 3.5 - 5.0 g/dL   AST 247 (H) 15 - 41 U/L   ALT 303 (H) 14 - 54 U/L   Alkaline Phosphatase 171 (H) 38 - 126 U/L   Total Bilirubin 2.9 (H) 0.3 - 1.2 mg/dL   GFR calc non Af Amer 23 (L) >60 mL/min   GFR calc Af Amer 27 (L) >60 mL/min    Comment: (NOTE) The eGFR has been calculated using the CKD EPI equation. This calculation has not been validated in all clinical situations. eGFR's persistently <60 mL/min signify possible Chronic Kidney Disease.    Anion gap 7 5 - 15   No results found.  Assessment:  Elizabeth Bond is a 66 y.o. female with metastatic Her2/neu + breast cancer.  She presented with weakness, dehydration, hypotension, and confusion.  Recent urinalysis revealed hematuria.  She has had C diff + and E coli bloody diarrhea.  She has progressive thrombocytopenia.  She was diagnosed with brain metastasis on 06/28/2016 after presenting with seizure and encephalopathy. Head MRIrevealed multiple mixed parenchymal and extra-axial intracranial masses. She was started on Keppraand Decadron.  She began whole brain radiationon 07/06/2016. Plan wasfor whole brain radiation delivering 3000 cGyin 10 fractions. She has 3 treatments left.  She presented with an acute inferior wall MI on 07/11/2016. Cardiac catheterization revealed 100% stenosis of the mid right coronary. A drug eluting stent was placed. EFwas 45-50%. She was discharged on aspirin, Plavix, lisinopril, and Lipitor.  She has recent increased liver function tests of  unclear etiology.  Hepatitis B and C testing are pending (drawn 07/21/2016).  RUQ ultrasound revealed mild hepatic steatosis.  Symptomatically, mental status is back to baseline.  She remains weak.  She has mid abdominal discomfort.  Diarrhea improved.  She wants to go home.  Plan:   1.  Oncology:  Patient has metastatic HER-2/neu positive breast cancer. She was recently diagnosed with brain metastasis. She was undergoing cranial radiation. She has missed the last 2 days of radiation.  Radiation currently on hold secondary to C difficile infection.  Continue Decadron.  Head CT revealed improvement.    PET scan on 05/19/2017 revealed innumerable partially cavitary upper lobe pulmonary nodules, negative by bronchoscopy.  Etiology is unclear.  Etiology of increased liver function test is unclear. Recent right upper quadrant ultrasound revealed only hepatic steatosis.  Hepatitis serologies are pending.  Will need liver imaging at some point.  2.  Hematology: Platelet count improved today (40,000 to 47,000). Etiology of thrombocytopenia felt secondary to infection. No schistocytes on peripheral smear.  Doubt TTP/HUS.  No new medications or herbal products implicated.    She has been exposed to heparin.  Heparin-induced platelet antibody indeterminate (0.756).  Await functional assay (serotonin release assay).  Doubt HIT.  Maintain active type and screen given the bloody stool and low platelet count.  She remains on aspirin and Plavix secondary to her recent stent.  No heparin or Lovenox.  Follow CBC daily.  3.  Infectious Disease:  Patient with C. difficile positive stool. She is on oral vancomycin. Enteric precautions continue. Urinalysis culture with multiple species c/w contamination. Low threshold for blood cultures as patient on steroids which suppresses fevers.  4.  Gastroenterology: C. difficile diarrhea on treatment. Diarrhea improved. No further blood in stool per patient.  Patient has  elevated liver function tests. Bilirubin 1.7 to 2.9.  Fractionate.  AST, AST, and alkaline phosphatase remain elevated.  Etiology is unclear. Hepatitis B and C testing performed on 07/21/2016  pending (sent to [facility]).   5.  Cardiology:  Patient with recent MI s/p cardiac stent placement.  She remains on aspirin and Plavix.  6.  Nephrology:  Increasing creatinine felt secondary to acute prerenal azotemia.  Patient currently on IVF.  Oral intake remains poor.  7.  Neurology:  Patient with altered mental status on admission, now at baseline.  Etiology likely secondary to dehydration, hypotension, and infection.  Head CT improved.   8:  Disposition:  Patient remains weak and unable to care for herself.  Appreciate palliative care consult.  Code status is DNR.    Lequita Asal, MD  07/27/2016, 9:21 PM

## 2016-07-28 ENCOUNTER — Ambulatory Visit: Payer: Medicare Other

## 2016-07-28 ENCOUNTER — Inpatient Hospital Stay: Payer: Medicare Other

## 2016-07-28 ENCOUNTER — Inpatient Hospital Stay: Payer: Medicare Other | Admitting: Hematology and Oncology

## 2016-07-28 LAB — COMPREHENSIVE METABOLIC PANEL
ALK PHOS: 274 U/L — AB (ref 38–126)
ALT: 460 U/L — ABNORMAL HIGH (ref 14–54)
AST: 470 U/L — AB (ref 15–41)
Albumin: 1.7 g/dL — ABNORMAL LOW (ref 3.5–5.0)
Anion gap: 8 (ref 5–15)
BILIRUBIN TOTAL: 4.9 mg/dL — AB (ref 0.3–1.2)
BUN: 75 mg/dL — AB (ref 6–20)
CALCIUM: 6.7 mg/dL — AB (ref 8.9–10.3)
CO2: 17 mmol/L — ABNORMAL LOW (ref 22–32)
CREATININE: 2.65 mg/dL — AB (ref 0.44–1.00)
Chloride: 108 mmol/L (ref 101–111)
GFR calc non Af Amer: 18 mL/min — ABNORMAL LOW (ref >60)
GFR, EST AFRICAN AMERICAN: 20 mL/min — AB (ref >60)
Glucose, Bld: 81 mg/dL (ref 65–99)
Potassium: 5.5 mmol/L — ABNORMAL HIGH (ref 3.5–5.1)
Sodium: 133 mmol/L — ABNORMAL LOW (ref 135–145)
TOTAL PROTEIN: 4.2 g/dL — AB (ref 6.5–8.1)

## 2016-07-28 LAB — HEPATITIS B CORE ANTIBODY, IGM
Hep B C IgM: NEGATIVE
Hep B C IgM: NEGATIVE

## 2016-07-28 LAB — CBC
HEMATOCRIT: 53.4 % — AB (ref 35.0–47.0)
HEMOGLOBIN: 18 g/dL — AB (ref 12.0–16.0)
MCH: 33.4 pg (ref 26.0–34.0)
MCHC: 33.7 g/dL (ref 32.0–36.0)
MCV: 98.9 fL (ref 80.0–100.0)
PLATELETS: 47 10*3/uL — AB (ref 150–440)
RBC: 5.4 MIL/uL — AB (ref 3.80–5.20)
RDW: 19.5 % — ABNORMAL HIGH (ref 11.5–14.5)
WBC: 7.9 10*3/uL (ref 3.6–11.0)

## 2016-07-28 LAB — HEPATITIS B SURFACE ANTIGEN
Hepatitis B Surface Ag: NEGATIVE
Hepatitis B Surface Ag: NEGATIVE

## 2016-07-28 LAB — SEROTONIN RELEASE ASSAY (SRA)
SRA .2 IU/mL UFH Ser-aCnc: 1 % (ref 0–20)
SRA 100IU/mL UFH Ser-aCnc: <1 % (ref 0–20)

## 2016-07-28 LAB — HEPATITIS C ANTIBODY
HCV Ab: <0.1 s/co ratio (ref 0.0–0.9)
HCV Ab: <0.1 s/co ratio (ref 0.0–0.9)

## 2016-07-28 LAB — BILIRUBIN, DIRECT: Bilirubin, Direct: 3 mg/dL — ABNORMAL HIGH (ref 0.1–0.5)

## 2016-07-28 MED ORDER — STERILE WATER FOR INJECTION IV SOLN
INTRAVENOUS | Status: DC
  Filled 2016-07-28 (×2): qty 850

## 2016-07-28 MED ORDER — PATIROMER SORBITEX CALCIUM 8.4 G PO PACK
8.4000 g | PACK | Freq: Every day | ORAL | Status: DC
  Filled 2016-07-28: qty 4

## 2016-07-31 ENCOUNTER — Ambulatory Visit: Payer: Medicare Other

## 2016-07-31 ENCOUNTER — Encounter: Payer: Self-pay | Admitting: Internal Medicine

## 2016-08-01 ENCOUNTER — Ambulatory Visit: Payer: Medicare Other

## 2016-08-02 ENCOUNTER — Ambulatory Visit: Payer: Medicare Other

## 2016-08-02 NOTE — Progress Notes (Signed)
[facility]  ROUNDING NOTE   Subjective:   Calcium dropping  Creatinine 2.65 (2.09) (1.48)  NS at 180mL/hr  PO vanco  Objective:  Vital signs in last 24 hours:  Temp:  [97.4 F (36.3 C)-98.3 F (36.8 C)] 97.7 F (36.5 C) (07/27 0808) Pulse Rate:  [77-89] 77 (07/27 0808) Resp:  [16-20] 16 (07/27 0808) BP: (125-141)/(72-89) 125/83 (07/27 0808) SpO2:  [91 %-95 %] 94 % (07/27 0808) Weight:  [63.3 kg (139 lb 9.6 oz)] 63.3 kg (139 lb 9.6 oz) (07/27 0500)  Weight change: -5.171 kg (-11 lb 6.4 oz) Filed Weights   07/26/16 0544 07/27/16 0500 18-Aug-2016 0500  Weight: 65.3 kg (143 lb 14.4 oz) 68.5 kg (151 lb) 63.3 kg (139 lb 9.6 oz)    Intake/Output: I/O last 3 completed shifts: In: 968.3 [P.O.:120; I.V.:848.3] Out: 0    Intake/Output this shift:  Total I/O In: 2102.1 [I.V.:2102.1] Out: -   Physical Exam: General: NAD,   Head: Normocephalic, atraumatic. Moist oral mucosal membranes  Eyes: Anicteric, PERRL  Neck: Supple, trachea midline  Lungs:  Clear to auscultation  Heart: Regular rate and rhythm  Abdomen:  Soft, nontender,   Extremities: no peripheral edema.  Neurologic: Nonfocal, moving all four extremities  Skin: No lesions       Basic Metabolic Panel:  Recent Labs Lab 07/10/2016 1640 07/25/16 0409 07/26/16 0527 07/27/16 0533 08-18-2016 0547  NA 134* 136 137 134* 133*  K 3.8 3.4* 4.2 4.7 5.5*  CL 101 105 107 107 108  CO2 26 23 23  20* 17*  GLUCOSE 87 76 84 80 81  BUN 33* 32* 53* 63* 75*  CREATININE 0.75 0.66 1.48* 2.09* 2.65*  CALCIUM 8.1* 7.5* 7.5* 7.1* 6.7*  MG  --  1.9  --   --   --     Liver Function Tests:  Recent Labs Lab 07/16/2016 1640 07/26/16 0527 07/27/16 0533 2016/08/18 0547  AST 291* 266* 247* 470*  ALT 335* 323* 303* 460*  ALKPHOS 136* 140* 171* 274*  BILITOT 1.7* 2.5* 2.9* 4.9*  PROT 4.9* 4.5* 4.5* 4.2*  ALBUMIN 2.1* 1.9* 1.9* 1.7*   No results for input(s): LIPASE, AMYLASE in the last 168 hours. No results for  input(s): AMMONIA in the last 168 hours.  CBC:  Recent Labs Lab 07/25/2016 1640 07/25/16 0409 07/26/16 0527 07/27/16 0533 18-Aug-2016 0547  WBC 5.8 5.1 5.5 5.2 7.9  NEUTROABS 4.5  --   --   --   --   HGB 13.7 15.0 15.9 16.6* 18.0*  HCT 40.3 43.4 46.7 49.2* 53.4*  MCV 96.7 98.5 96.9 97.0 98.9  PLT 56* 46* 40* 47* 47*    Cardiac Enzymes:  Recent Labs Lab 07/22/2016 1640  TROPONINI 2.99*    BNP: Invalid input(s): POCBNP  CBG: No results for input(s): GLUCAP in the last 168 hours.  Microbiology: Results for orders placed or performed during the hospital encounter of 07/23/2016  Urine Culture     Status: Abnormal   Collection Time: 07/31/2016  6:45 PM  Result Value Ref Range Status   Specimen Description URINE, RANDOM  Final   Special Requests NONE  Final   Culture MULTIPLE SPECIES PRESENT, SUGGEST RECOLLECTION (A)  Final   Report Status 07/26/2016 FINAL  Final  Gastrointestinal Panel by PCR , Stool     Status: Abnormal   Collection Time: 07/25/16  9:10 AM  Result Value Ref Range Status   Campylobacter species NOT DETECTED NOT DETECTED Final   Plesimonas shigelloides NOT  DETECTED NOT DETECTED Final   Salmonella species NOT DETECTED NOT DETECTED Final   Yersinia enterocolitica NOT DETECTED NOT DETECTED Final   Vibrio species NOT DETECTED NOT DETECTED Final   Vibrio cholerae NOT DETECTED NOT DETECTED Final   Enteroaggregative E coli (EAEC) NOT DETECTED NOT DETECTED Final   Enterotoxigenic E coli (ETEC) NOT DETECTED NOT DETECTED Final   Shiga like toxin producing E coli (STEC) DETECTED (A) NOT DETECTED Final    Comment: CRITICAL RESULT CALLED TO, READ BACK BY AND VERIFIED WITH: SERENITY KIRKENDAHL ON 07/25/16 AT 1124 QSD    E. coli O157 NOT DETECTED NOT DETECTED Final   Shigella/Enteroinvasive E coli (EIEC) NOT DETECTED NOT DETECTED Final   Cryptosporidium NOT DETECTED NOT DETECTED Final   Cyclospora cayetanensis NOT DETECTED NOT DETECTED Final   Entamoeba histolytica NOT  DETECTED NOT DETECTED Final   Giardia lamblia NOT DETECTED NOT DETECTED Final   Adenovirus F40/41 NOT DETECTED NOT DETECTED Final   Astrovirus NOT DETECTED NOT DETECTED Final   Norovirus GI/GII NOT DETECTED NOT DETECTED Final   Rotavirus A NOT DETECTED NOT DETECTED Final   Sapovirus (I, II, IV, and V) NOT DETECTED NOT DETECTED Final  C difficile quick scan w PCR reflex     Status: Abnormal   Collection Time: 07/25/16  9:10 AM  Result Value Ref Range Status   C Diff antigen POSITIVE (A) NEGATIVE Final   C Diff toxin NEGATIVE NEGATIVE Final   C Diff interpretation Results are indeterminate. See PCR results.  Final  Clostridium Difficile by PCR     Status: Abnormal   Collection Time: 07/25/16  9:10 AM  Result Value Ref Range Status   Toxigenic C Difficile by pcr POSITIVE (A) NEGATIVE Final    Comment: Positive for toxigenic C. difficile with little to no toxin production. Only treat if clinical presentation suggests symptomatic illness.    Coagulation Studies: No results for input(s): LABPROT, INR in the last 72 hours.  Urinalysis: No results for input(s): COLORURINE, LABSPEC, PHURINE, GLUCOSEU, HGBUR, BILIRUBINUR, KETONESUR, PROTEINUR, UROBILINOGEN, NITRITE, LEUKOCYTESUR in the last 72 hours.  Invalid input(s): APPERANCEUR    Imaging: US Abdomen Limited Ruq  Result Date: 08-05-2016 CLINICAL DATA:  Elevated LFTs. EXAM: ULTRASOUND ABDOMEN LIMITED RIGHT UPPER QUADRANT COMPARISON:  07/21/2016. FINDINGS: Gallbladder: Sludge noted in the gallbladder. Gallbladder wall thickness 5.9 mm. Negative Murphy sign. Common bile duct: Diameter: 5.7 mm Liver: Heterogeneous liver with irregular border consistent with cirrhosis. Ascites and right pleural effusion noted. No flow noted in portal vein. During the course of the exam the patient coded and died. Patient was a do not resuscitate patient. Reference is made to the Clinical note. IMPRESSION: See discussion above . Electronically Signed   By: Marcello Moores   Register   On: 08-05-2016 11:01     Medications:   .  sodium bicarbonate (isotonic) infusion in sterile water     . aspirin  81 mg Oral Daily  . atorvastatin  80 mg Oral q1800  . calcium-vitamin D  1 tablet Oral BID  . clopidogrel  75 mg Oral Q breakfast  . dexamethasone  4 mg Oral BID WC  . levETIRAcetam  500 mg Oral BID  . magnesium oxide  400 mg Oral Daily  . pantoprazole  40 mg Oral Daily  . patiromer  8.4 g Oral Daily  . vancomycin  125 mg Oral Q6H   acetaminophen **OR** acetaminophen, acetaminophen, clonazePAM, HYDROcodone-acetaminophen, loratadine, ondansetron **OR** ondansetron (ZOFRAN) IV  Assessment/ Plan:  Ms. AVENLY ROBERGE  is a 66 y.o.  female Ms. TYEISHA DINAN is a 66 y.o. white female with breast cancer with metastasis, IBS, depression, GERD, arthritis, hyperlipidemia who was admitted to [facility] on 07/23/2016    1. Acute renal failure with hyperkalemia, hematuria and proteinuria: history consistent with acute prerenal azotemia. No signs of schistocytes and cleared of HUS/TTP - systemic steroids - IV fluids - no indication for dialysis.  - Start Veltassa today for hyperkalemia  2. Thrombocytopenia:, improved to 47  3. Hypocalcemia: precipitated by NS infusion.   4. Hyponatremia: trending downward. Will monitor.     LOS: [place], Bettie Capistran Aug 25, 201812:47 PM

## 2016-08-02 NOTE — Progress Notes (Signed)
Elizabeth Bond responded to a code blue in Ultrasound 2. Pt is a DNR. Pt passed at 10:25. CH assisted MD and Palliative Care RN as they informed Pt sister of her passing. Elizabeth Bond escorted daughter and other family members to family waiting area. Elizabeth Bond escorted family for viewing in Ultrasound 1. CH provided the ministry of presence, empathetic listening, hospitality, and prayer.     21-Aug-2016 1300  Clinical Encounter Type  Visited With Family;Health care provider  Visit Type Initial;Spiritual support;Code;Death (Code Blue)  Referral From Nurse  Consult/Referral To Chaplain;Palliative care;Faith community  Spiritual Encounters  Spiritual Needs Prayer;Emotional;Grief support

## 2016-08-02 NOTE — Care Management (Signed)
[facility] does not have record that patient has active medicaid at this time. Reference to call [id number]

## 2016-08-02 NOTE — Progress Notes (Signed)
Code blue called in ultrasound. Patient was in vfib and then asystole. Patient had spoken with daughter and I previously regarding her wishes on NO resuscitation or intubation if her heart were to stop. Durable DNR and MOST form in chart.Time of death 10:25. Multiple members of the care team and I provided therapeutic listening and emotional/spiritual support to family on the passing of Elizabeth Bond.  NO CHARGE  Ihor Dow, FNP-C Palliative Medicine Team  Phone: [phone] Fax: [phone]

## 2016-08-02 NOTE — Progress Notes (Signed)
tele called states patient cardiac rhythm VT, proceeded to ultrasound dept with charge nurse

## 2016-08-02 NOTE — Progress Notes (Signed)
Tele called states patient in Vfib. En route to ultrasound dept. With charge nurse

## 2016-08-02 NOTE — Progress Notes (Signed)
Tele called states patient cardiac rhythm asystole. Arrived in ultrasound dept. At this time. MD at bedside

## 2016-08-02 DEATH — deceased

## 2016-08-11 ENCOUNTER — Ambulatory Visit: Payer: Medicare Other | Admitting: Hematology and Oncology

## 2016-08-11 ENCOUNTER — Other Ambulatory Visit: Payer: Medicare Other

## 2016-08-16 ENCOUNTER — Other Ambulatory Visit: Payer: Self-pay | Admitting: Nurse Practitioner

## 2016-08-30 ENCOUNTER — Ambulatory Visit: Payer: Medicare Other | Admitting: Radiation Oncology

## 2016-09-02 NOTE — Discharge Summary (Signed)
Cause of death      C diff colitis  Contributory factors    ac Renal failure    Thrombocytopenia     Breast cancer with mets to brain.  Hospital course   * C. difficile colitis   C. difficile PCR is positive.   Patient also have shiga toxin producing Escherichia coli.   I spoke on phone to infectious disease specialist at [facility], who was covering in absence of our permanent infectious disease doctor.   Due to high risk of developing HUS if treated, suggested no antibiotic to give for Escherichia coli.   At this time treat C. difficile with oral vancomycin 125 mg every 6 hours.   I also discussed the case with GI physician, he suggested follow with antibiotics as she is not a candidate for any invasive workup due to acute colitis.  * Hypotension   Most likely secondary to infection, continue IV fluids and hold hypertensive medications.  * Thrombocytopenia likely secondary to her infection.   Gradual drop in her platelet count for last 2 weeks.   She need to stay on aspirin and Plavix because of recent cardiac catheterization and stenting.   Currently hemoglobin is stable in spite of her having some bleeding due to colitis.   I discussed with cardiologist and he suggested for now to continue aspirin and Plavix both, if more concerned tomorrow then we may stop her aspirin.   Appreciated hematology consult, ordered HIT work ups as she had heparin 2 weeks ago.   Peripheral smear is reviewed by pathologist , no abnormalities or clumps except low platelets.    Currently hold any anticoagulants, cont ASA+ plavix.  * Elevated troponin   Recent ST elevation MI- CAD   Cardiologist feel this is not a new event but her troponin coming down from her acute event 2 weeks ago.   Advise no further interventions but to continue her cardiac medications.   Beta blockers were held because of hypotension.   Continue aspirin, Plavix, atorvastatin.  * ac renal failure   Due to diarrhea and  infection- ATN   Cont IV fluids.   No TTP or HUS as per pathologist.   Appreciated nephrologist consult.    Renal func gradually getting worse.  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   Have some surrounding brain edema but decrease in the size of her metastases.   Palliative care consult is called in because of her multiple complicating factors.  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 Continue Decadron.  Pt had worsening liver func, so requested to have RUQ sonogram, and while having that- she had ventricular arrhyhmia and then cardiac arrest.

## 2016-09-22 ENCOUNTER — Other Ambulatory Visit: Payer: Medicare Other

## 2016-09-22 ENCOUNTER — Ambulatory Visit: Payer: Medicare Other | Admitting: Hematology and Oncology

## 2016-11-03 ENCOUNTER — Ambulatory Visit: Payer: Medicare Other | Admitting: Hematology and Oncology

## 2016-11-03 ENCOUNTER — Other Ambulatory Visit: Payer: Medicare Other

## 2017-12-25 IMAGING — CT CT HEAD W/O CM
3 series · 15 of 47 positions shown, 18 images · non-contrast
Comparison: 07/11/2016 06/28/2016 head CT.  06/28/2016 brain MR.

CLINICAL DATA: 66-year-old female with metastatic breast cancer
post treatment with progressive weakness. Subsequent encounter.

EXAM:
CT HEAD WITHOUT CONTRAST
TECHNIQUE: Contiguous axial images were obtained from the base of the skull
through the vertex without intravenous contrast.

[Series 2: head wo · axial · 0.47mm/px · z∈[-136,-11]mm · 9 of 30 slices shown, 12 images]
[im 3/30  brain]
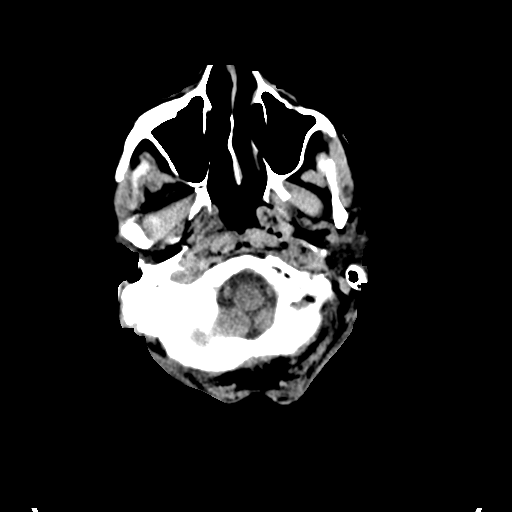
[im 3/30  bone]
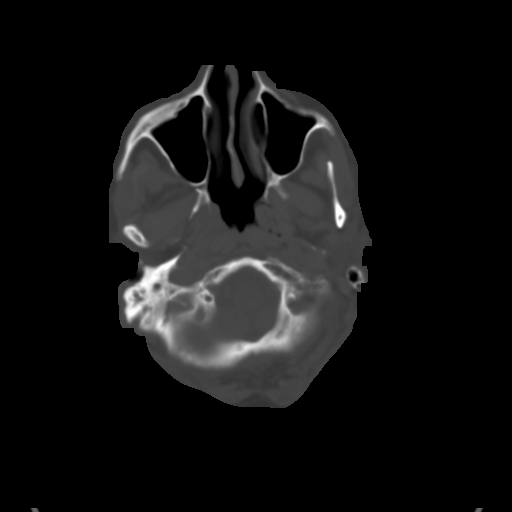
[im 6/30  brain]
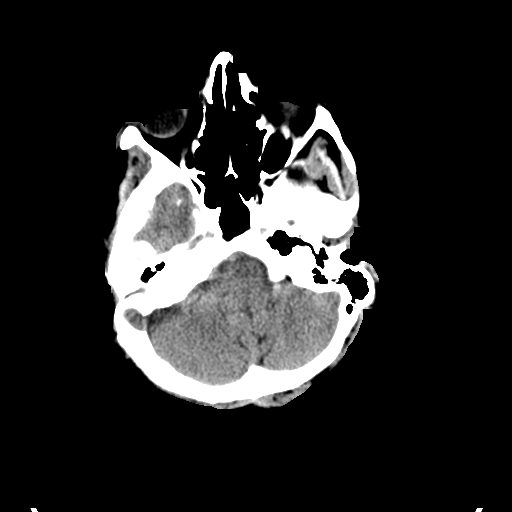
[im 9/30  brain]
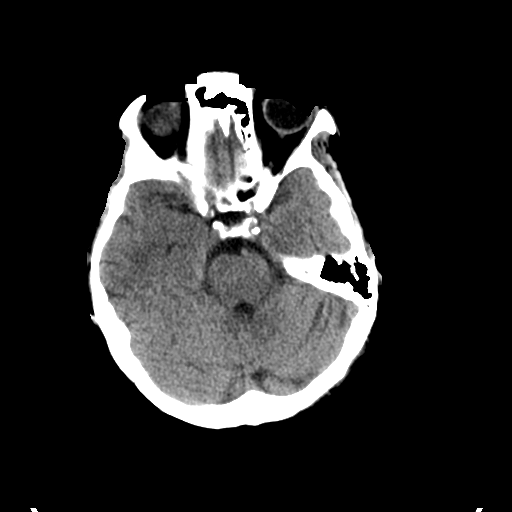
[im 12/30  brain]
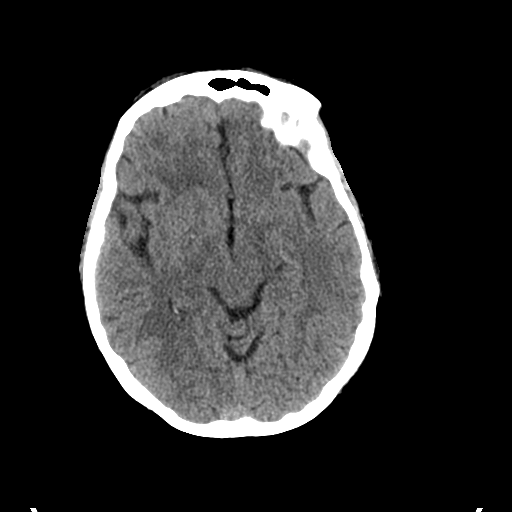
[im 16/30  brain]
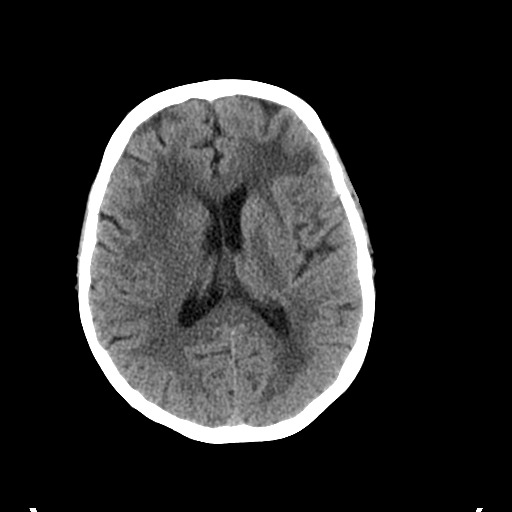
[im 16/30  bone]
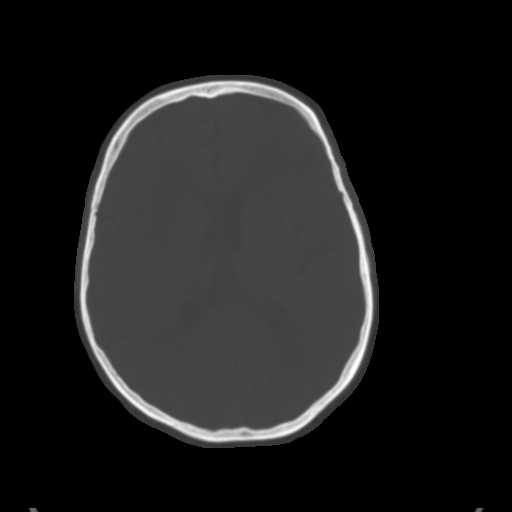
[im 19/30  brain]
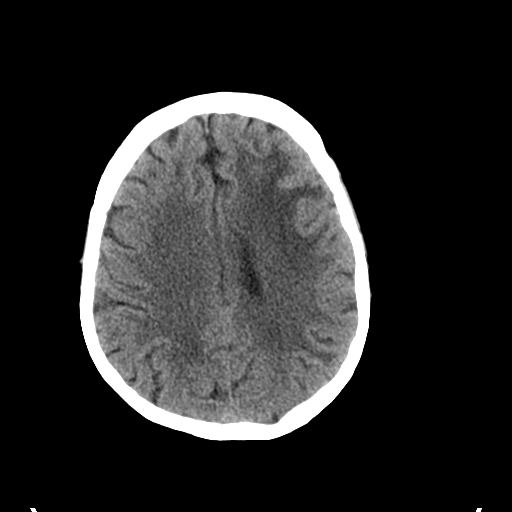
[im 22/30  brain]
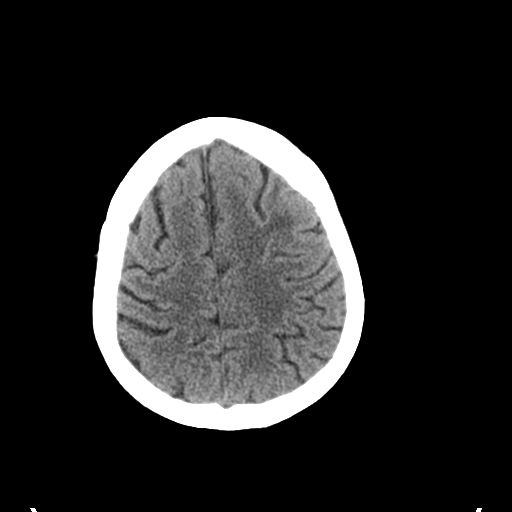
[im 25/30  brain]
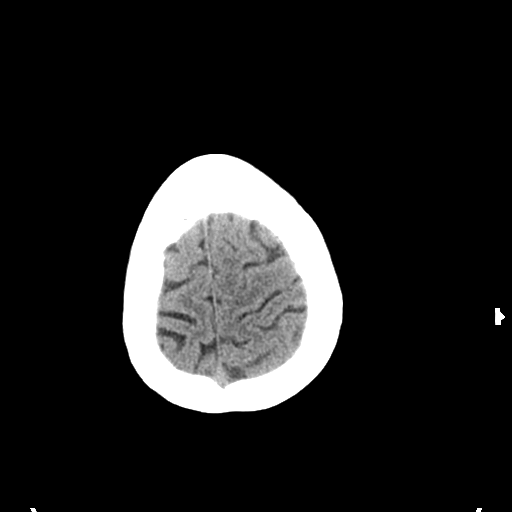
[im 28/30  brain]
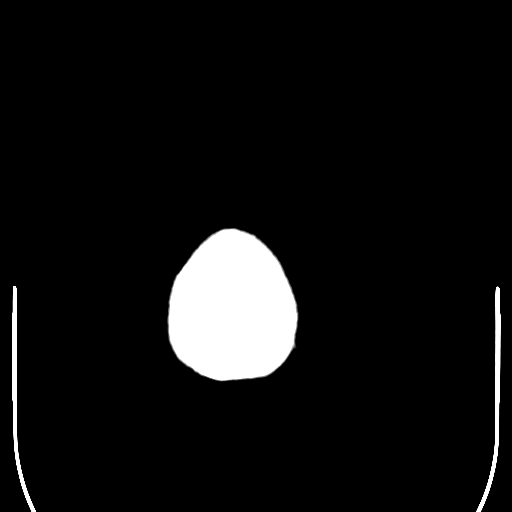
[im 28/30  bone]
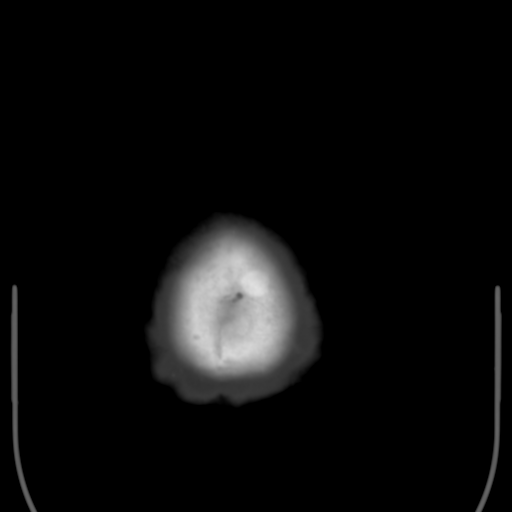

[Series 4: coronal soft tissue · coronal · 0.29mm/px · 3 of 63 slices shown]
[im 23/63  brain]
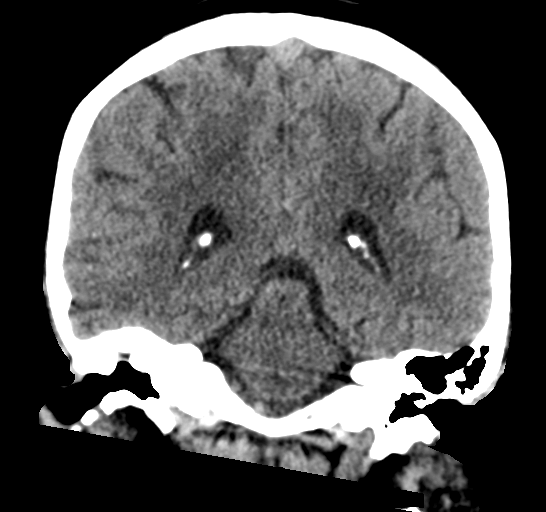
[im 29/63  brain]
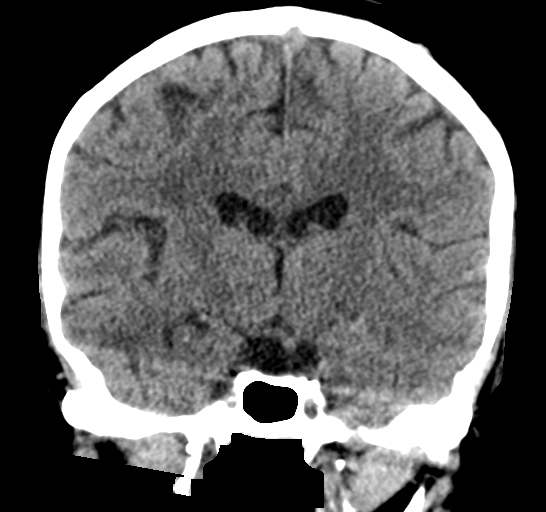
[im 34/63  brain]
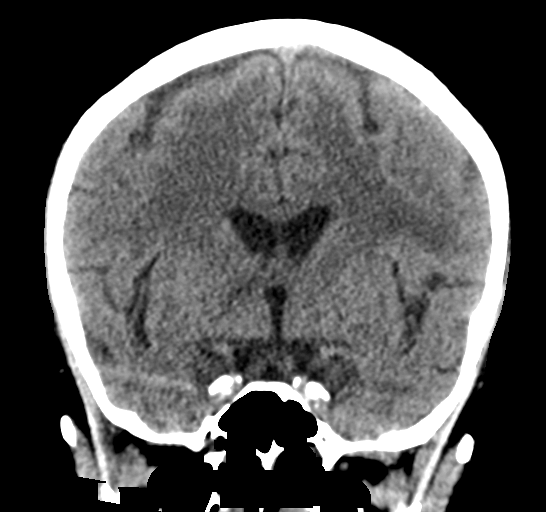

[Series 5: sagittal soft tissue · sagittal · 0.29mm/px · 3 of 50 slices shown]
[im 17/50  brain]
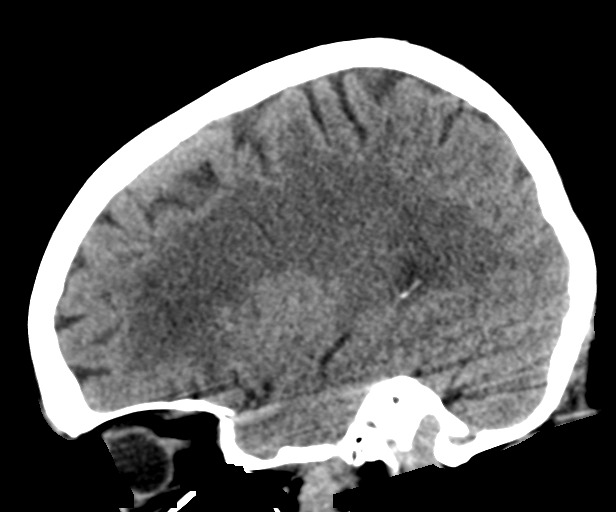
[im 25/50  brain]
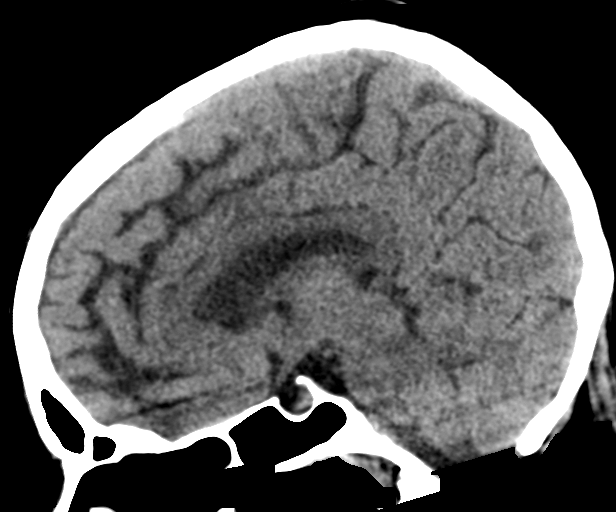
[im 33/50  brain]
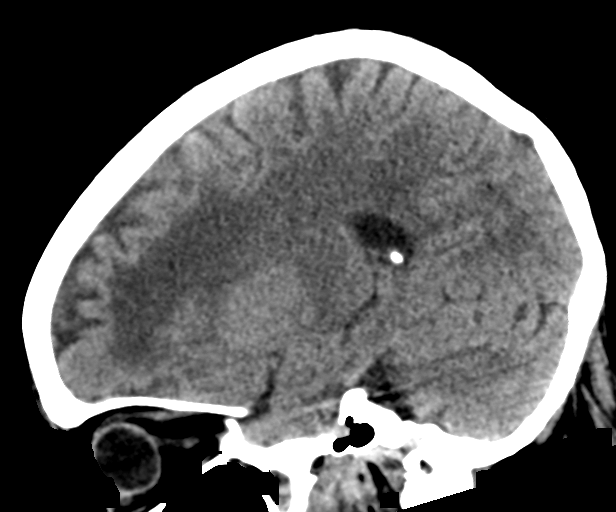

[15 of 47 positions shown; findings below may reference images not displayed]

FINDINGS: Brain: Further decrease in size of intracranial metastatic lesions
and surrounding vasogenic edema. Vasogenic edema remains in portions
of the right temporal lobe, left frontal lobe and left parietal
lobe.

Right caudate head lesion decrease in size with decrease slight
flattening of the right frontal horn.

No new intracranial abnormality, hemorrhage or CT evidence of large
acute infarct noted.

Vascular: Vascular calcifications.

Skull: No new focal calvarial abnormality.

Sinuses/Orbits: No acute orbital abnormality. Minimal mucosal
thickening left maxillary sinus and right sphenoid sinus.

Other: Opacification right mastoid air cells with breakthrough right
lateral wall similar prior exam. Partial opacification right middle
ear. Ossicles incompletely assessed and possibly partially eroded.
Retraction right tympanic membrane.
IMPRESSION: Further decrease in size of intracranial metastatic lesions and
surrounding vasogenic edema. Vasogenic edema remains in portions of
the right temporal lobe, left frontal lobe and left parietal lobe.

Right caudate head lesion decrease in size with decrease slight
flattening of the right frontal horn.

No new intracranial abnormality, hemorrhage or CT evidence of large
acute infarct noted.

Opacification right mastoid air cells with breakthrough right
lateral wall similar prior exam. Partial opacification right middle
ear. Ossicles incompletely assessed and possibly partially eroded.

## 2017-12-25 IMAGING — CR DG CHEST 2V
2 series · 2 of 2 positions shown · non-contrast
Comparison: 07/11/2016, [DATE]/ [DATE]

CLINICAL DATA: Breast cancer weakness and fatigue

EXAM:
CHEST  2 VIEW

[chest lat]
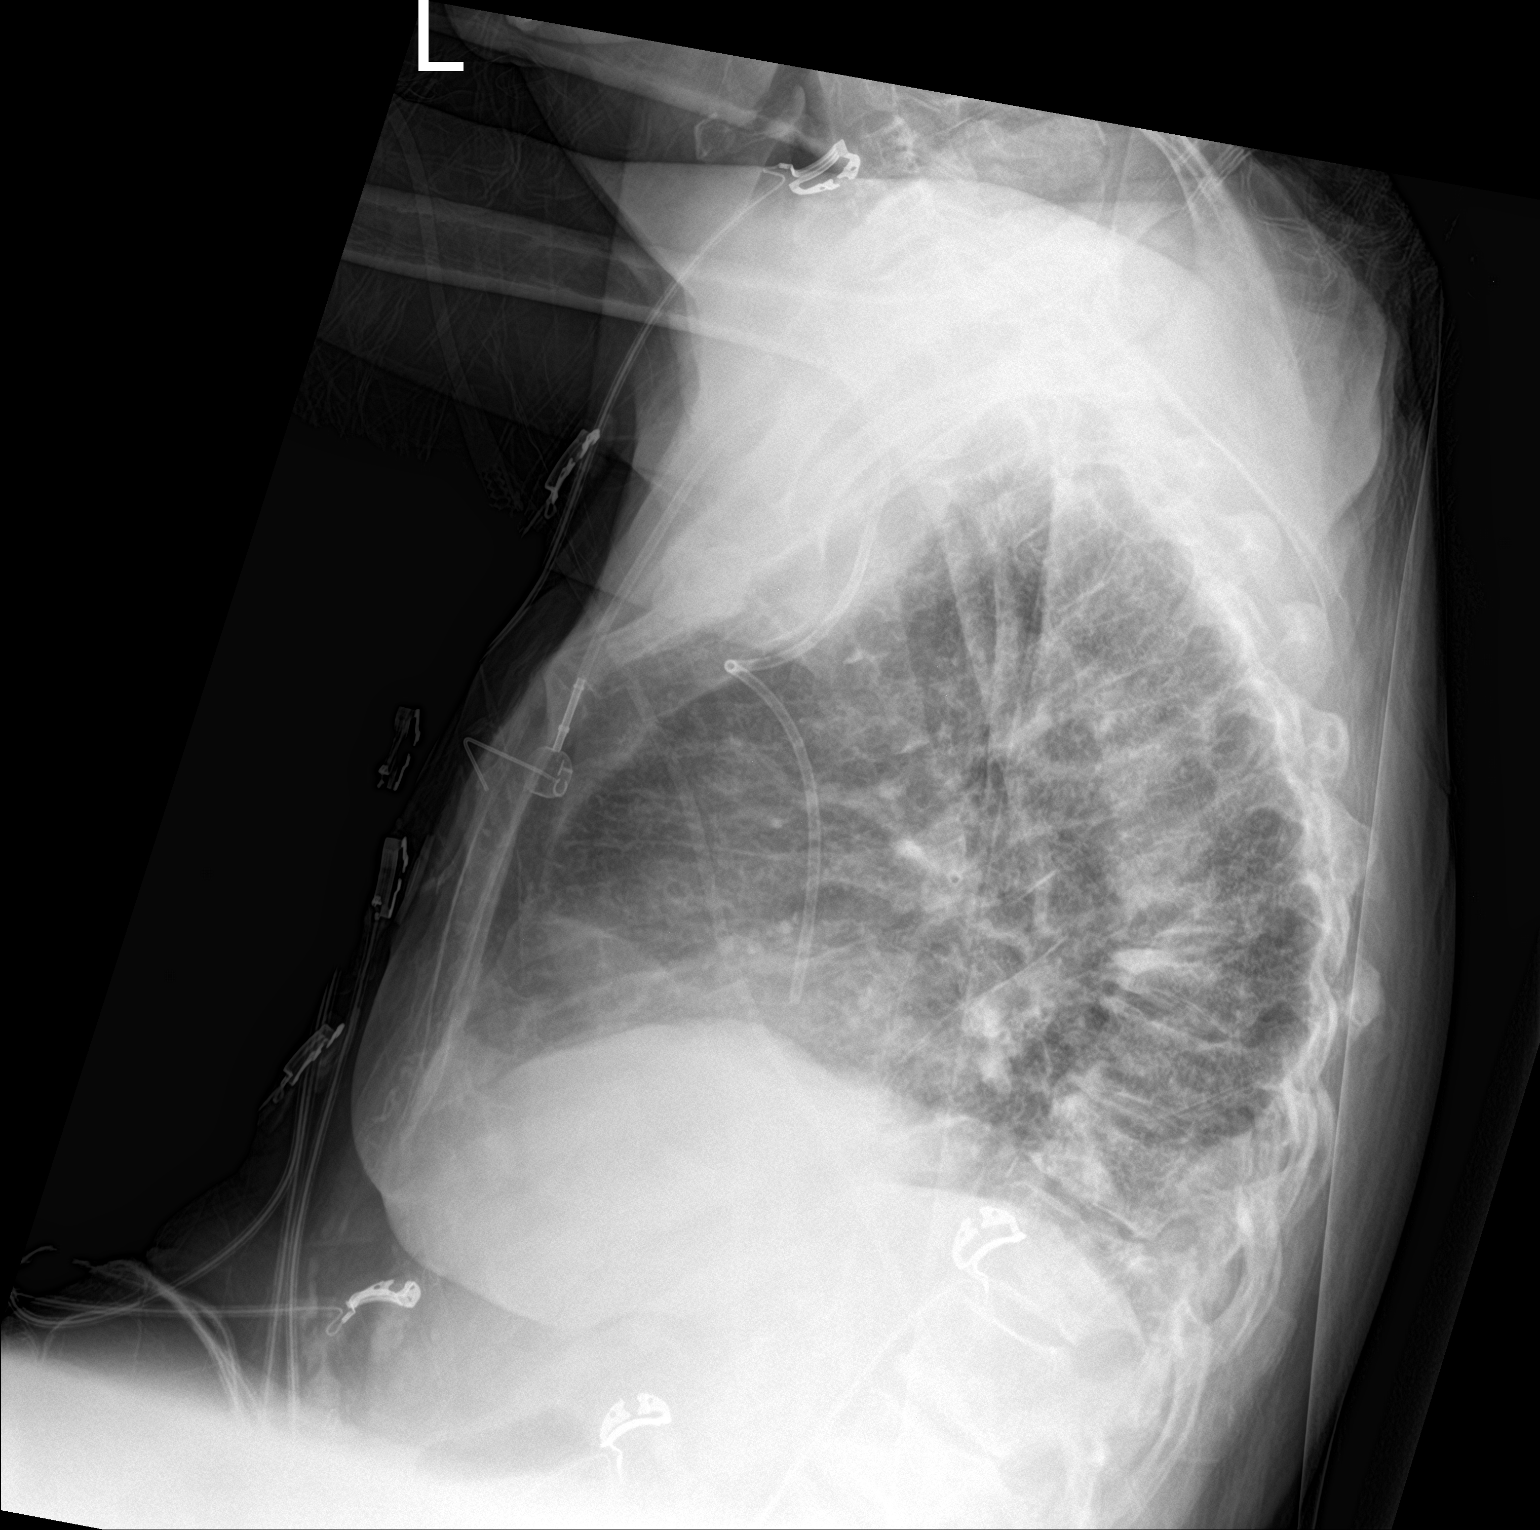

[chest ap]
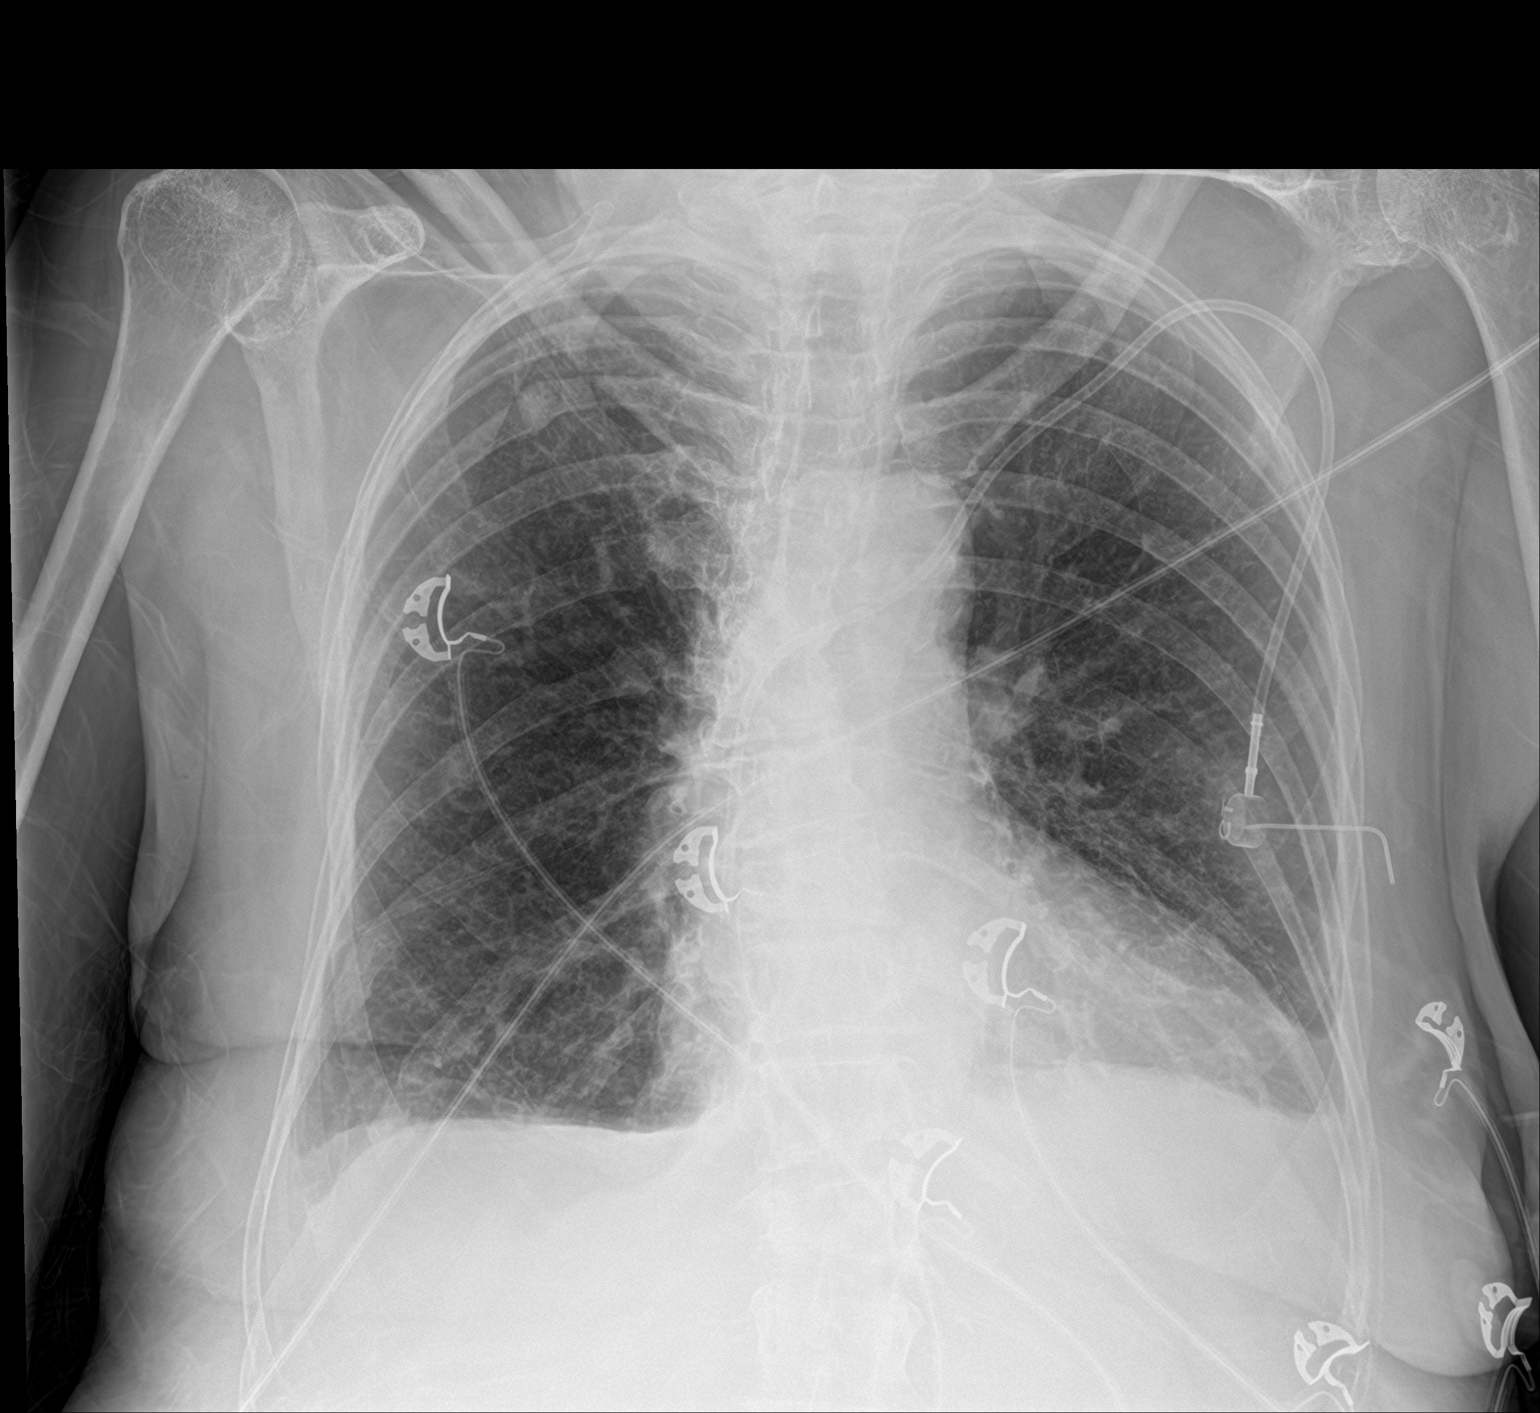

[2 of 2 positions shown; findings below may reference images not displayed]

FINDINGS: Left-sided central venous port with tip overlying the cavoatrial
junction. Small pleural effusions. No focal consolidation. Mild
cardiomegaly with aortic atherosclerosis. Stable 8-9 mm right upper
lobe pulmonary nodule. Stable moderate severe compression of mid to
lower thoracic vertebra.
IMPRESSION: 1. Small bilateral pleural effusions
2. Stable mild cardiomegaly
3. Stable right upper lobe pulmonary nodule
